# Patient Record
Sex: Female | Born: 1945
Health system: Southern US, Community
[De-identification: ages and names within clinical notes are randomized; demographics above are authoritative.]

## PROBLEM LIST (undated history)

## (undated) DIAGNOSIS — K449 Diaphragmatic hernia without obstruction or gangrene: Secondary | ICD-10-CM

## (undated) DIAGNOSIS — K219 Gastro-esophageal reflux disease without esophagitis: Secondary | ICD-10-CM

## (undated) DIAGNOSIS — Q453 Other congenital malformations of pancreas and pancreatic duct: Secondary | ICD-10-CM

## (undated) DIAGNOSIS — C4492 Squamous cell carcinoma of skin, unspecified: Secondary | ICD-10-CM

## (undated) DIAGNOSIS — K6389 Other specified diseases of intestine: Secondary | ICD-10-CM

## (undated) DIAGNOSIS — M1990 Unspecified osteoarthritis, unspecified site: Secondary | ICD-10-CM

## (undated) DIAGNOSIS — K648 Other hemorrhoids: Secondary | ICD-10-CM

## (undated) DIAGNOSIS — K589 Irritable bowel syndrome without diarrhea: Secondary | ICD-10-CM

## (undated) DIAGNOSIS — R0789 Other chest pain: Secondary | ICD-10-CM

## (undated) DIAGNOSIS — I480 Paroxysmal atrial fibrillation: Secondary | ICD-10-CM

## (undated) DIAGNOSIS — I340 Nonrheumatic mitral (valve) insufficiency: Secondary | ICD-10-CM

## (undated) DIAGNOSIS — K52831 Collagenous colitis: Secondary | ICD-10-CM

## (undated) DIAGNOSIS — Z5189 Encounter for other specified aftercare: Secondary | ICD-10-CM

## (undated) DIAGNOSIS — K5790 Diverticulosis of intestine, part unspecified, without perforation or abscess without bleeding: Secondary | ICD-10-CM

## (undated) DIAGNOSIS — H11003 Unspecified pterygium of eye, bilateral: Secondary | ICD-10-CM

## (undated) DIAGNOSIS — A0472 Enterocolitis due to Clostridium difficile, not specified as recurrent: Secondary | ICD-10-CM

## (undated) DIAGNOSIS — B3781 Candidal esophagitis: Secondary | ICD-10-CM

## (undated) DIAGNOSIS — E785 Hyperlipidemia, unspecified: Secondary | ICD-10-CM

## (undated) DIAGNOSIS — K222 Esophageal obstruction: Secondary | ICD-10-CM

## (undated) DIAGNOSIS — R931 Abnormal findings on diagnostic imaging of heart and coronary circulation: Secondary | ICD-10-CM

## (undated) DIAGNOSIS — S2249XA Multiple fractures of ribs, unspecified side, initial encounter for closed fracture: Secondary | ICD-10-CM

## (undated) DIAGNOSIS — K5190 Ulcerative colitis, unspecified, without complications: Secondary | ICD-10-CM

## (undated) DIAGNOSIS — S2239XA Fracture of one rib, unspecified side, initial encounter for closed fracture: Secondary | ICD-10-CM

## (undated) DIAGNOSIS — R011 Cardiac murmur, unspecified: Secondary | ICD-10-CM

## (undated) DIAGNOSIS — N200 Calculus of kidney: Secondary | ICD-10-CM

## (undated) HISTORY — DX: Other specified diseases of intestine: K63.89

## (undated) HISTORY — DX: Other hemorrhoids: K64.8

## (undated) HISTORY — PX: SHOULDER ARTHROSCOPY W/ ROTATOR CUFF REPAIR: SHX2400

## (undated) HISTORY — PX: CYSTOSCOPY W/ STONE MANIPULATION: SHX1427

## (undated) HISTORY — PX: BREAST CYST ASPIRATION: SHX578

## (undated) HISTORY — PX: BREAST BIOPSY: SHX20

## (undated) HISTORY — DX: Esophageal obstruction: K22.2

## (undated) HISTORY — DX: Fracture of one rib, unspecified side, initial encounter for closed fracture: S22.39XA

## (undated) HISTORY — DX: Hyperlipidemia, unspecified: E78.5

## (undated) HISTORY — DX: Diverticulosis of intestine, part unspecified, without perforation or abscess without bleeding: K57.90

## (undated) HISTORY — DX: Unspecified pterygium of eye, bilateral: H11.003

## (undated) HISTORY — DX: Cardiac murmur, unspecified: R01.1

## (undated) HISTORY — DX: Paroxysmal atrial fibrillation: I48.0

## (undated) HISTORY — DX: Other congenital malformations of pancreas and pancreatic duct: Q45.3

## (undated) HISTORY — DX: Multiple fractures of ribs, unspecified side, initial encounter for closed fracture: S22.49XA

## (undated) HISTORY — DX: Encounter for other specified aftercare: Z51.89

## (undated) HISTORY — DX: Diaphragmatic hernia without obstruction or gangrene: K44.9

## (undated) HISTORY — PX: OVARIAN CYST REMOVAL: SHX89

## (undated) HISTORY — DX: Abnormal findings on diagnostic imaging of heart and coronary circulation: R93.1

## (undated) HISTORY — PX: COLONOSCOPY: SHX174

## (undated) HISTORY — PX: EYE SURGERY: SHX253

## (undated) HISTORY — DX: Gastro-esophageal reflux disease without esophagitis: K21.9

## (undated) HISTORY — DX: Ulcerative colitis, unspecified, without complications: K51.90

## (undated) HISTORY — DX: Irritable bowel syndrome, unspecified: K58.9

## (undated) HISTORY — PX: LEG SURGERY: SHX1003

## (undated) HISTORY — DX: Nonrheumatic mitral (valve) insufficiency: I34.0

## (undated) HISTORY — DX: Candidal esophagitis: B37.81

## (undated) HISTORY — DX: Squamous cell carcinoma of skin, unspecified: C44.92

## (undated) HISTORY — PX: SQUAMOUS CELL CARCINOMA EXCISION: SHX2433

## (undated) HISTORY — DX: Collagenous colitis: K52.831

## (undated) HISTORY — PX: UPPER GASTROINTESTINAL ENDOSCOPY: SHX188

## (undated) HISTORY — DX: Enterocolitis due to Clostridium difficile, not specified as recurrent: A04.72

## (undated) HISTORY — DX: Unspecified osteoarthritis, unspecified site: M19.90

---
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
## 1970-07-23 HISTORY — PX: VAGINAL HYSTERECTOMY: SUR661

## 1973-07-23 HISTORY — PX: TONSILLECTOMY AND ADENOIDECTOMY: SUR1326

## 1998-03-23 ENCOUNTER — Other Ambulatory Visit: Admission: RE | Admit: 1998-03-23 | Discharge: 1998-03-23 | Payer: Self-pay | Admitting: Gastroenterology

## 1998-06-02 ENCOUNTER — Encounter: Payer: Self-pay | Admitting: Gastroenterology

## 1998-06-02 ENCOUNTER — Ambulatory Visit (HOSPITAL_COMMUNITY): Admission: RE | Admit: 1998-06-02 | Discharge: 1998-06-02 | Payer: Self-pay | Admitting: Gastroenterology

## 1999-02-15 ENCOUNTER — Ambulatory Visit (HOSPITAL_COMMUNITY): Admission: RE | Admit: 1999-02-15 | Discharge: 1999-02-15 | Payer: Self-pay | Admitting: Cardiovascular Disease

## 1999-06-01 ENCOUNTER — Other Ambulatory Visit: Admission: RE | Admit: 1999-06-01 | Discharge: 1999-06-01 | Payer: Self-pay | Admitting: Gynecology

## 1999-07-05 ENCOUNTER — Encounter: Admission: RE | Admit: 1999-07-05 | Discharge: 1999-07-05 | Payer: Self-pay | Admitting: Obstetrics and Gynecology

## 1999-07-05 ENCOUNTER — Encounter: Payer: Self-pay | Admitting: Obstetrics and Gynecology

## 2000-06-21 ENCOUNTER — Other Ambulatory Visit: Admission: RE | Admit: 2000-06-21 | Discharge: 2000-06-21 | Payer: Self-pay | Admitting: Gynecology

## 2000-06-25 ENCOUNTER — Encounter: Payer: Self-pay | Admitting: Gynecology

## 2000-06-25 ENCOUNTER — Encounter: Admission: RE | Admit: 2000-06-25 | Discharge: 2000-06-25 | Payer: Self-pay | Admitting: Gynecology

## 2001-01-31 ENCOUNTER — Encounter: Admission: RE | Admit: 2001-01-31 | Discharge: 2001-01-31 | Payer: Self-pay | Admitting: Gynecology

## 2001-01-31 ENCOUNTER — Encounter: Admission: RE | Admit: 2001-01-31 | Discharge: 2001-01-31 | Payer: Self-pay | Admitting: Gastroenterology

## 2001-01-31 ENCOUNTER — Encounter: Payer: Self-pay | Admitting: Gastroenterology

## 2001-01-31 ENCOUNTER — Encounter: Payer: Self-pay | Admitting: Gynecology

## 2001-07-30 ENCOUNTER — Encounter: Admission: RE | Admit: 2001-07-30 | Discharge: 2001-07-30 | Payer: Self-pay | Admitting: Gynecology

## 2001-07-30 ENCOUNTER — Encounter: Payer: Self-pay | Admitting: Gynecology

## 2001-09-29 ENCOUNTER — Other Ambulatory Visit: Admission: RE | Admit: 2001-09-29 | Discharge: 2001-09-29 | Payer: Self-pay | Admitting: Gynecology

## 2002-07-23 DIAGNOSIS — K449 Diaphragmatic hernia without obstruction or gangrene: Secondary | ICD-10-CM

## 2002-07-23 HISTORY — DX: Diaphragmatic hernia without obstruction or gangrene: K44.9

## 2002-07-31 ENCOUNTER — Encounter: Admission: RE | Admit: 2002-07-31 | Discharge: 2002-07-31 | Payer: Self-pay | Admitting: Gynecology

## 2002-07-31 ENCOUNTER — Encounter: Payer: Self-pay | Admitting: Gynecology

## 2003-01-18 ENCOUNTER — Other Ambulatory Visit: Admission: RE | Admit: 2003-01-18 | Discharge: 2003-01-18 | Payer: Self-pay | Admitting: Obstetrics and Gynecology

## 2003-01-20 ENCOUNTER — Encounter: Payer: Self-pay | Admitting: Gynecology

## 2003-01-20 ENCOUNTER — Encounter: Admission: RE | Admit: 2003-01-20 | Discharge: 2003-01-20 | Payer: Self-pay | Admitting: Gynecology

## 2003-06-30 ENCOUNTER — Encounter (INDEPENDENT_AMBULATORY_CARE_PROVIDER_SITE_OTHER): Payer: Self-pay | Admitting: Gastroenterology

## 2003-10-05 ENCOUNTER — Other Ambulatory Visit: Payer: Self-pay

## 2004-02-22 ENCOUNTER — Encounter: Admission: RE | Admit: 2004-02-22 | Discharge: 2004-02-22 | Payer: Self-pay | Admitting: Gynecology

## 2004-03-14 ENCOUNTER — Other Ambulatory Visit: Admission: RE | Admit: 2004-03-14 | Discharge: 2004-03-14 | Payer: Self-pay | Admitting: Gynecology

## 2004-03-21 ENCOUNTER — Encounter (INDEPENDENT_AMBULATORY_CARE_PROVIDER_SITE_OTHER): Payer: Self-pay | Admitting: Gastroenterology

## 2004-06-19 ENCOUNTER — Ambulatory Visit: Payer: Self-pay | Admitting: Cardiology

## 2004-06-23 ENCOUNTER — Ambulatory Visit: Payer: Self-pay | Admitting: Gastroenterology

## 2004-06-26 ENCOUNTER — Ambulatory Visit: Payer: Self-pay

## 2005-03-13 ENCOUNTER — Encounter: Admission: RE | Admit: 2005-03-13 | Discharge: 2005-03-13 | Payer: Self-pay | Admitting: Gynecology

## 2005-06-20 ENCOUNTER — Ambulatory Visit: Payer: Self-pay | Admitting: Cardiology

## 2005-07-17 ENCOUNTER — Ambulatory Visit: Payer: Self-pay | Admitting: Family Medicine

## 2005-10-24 ENCOUNTER — Ambulatory Visit: Payer: Self-pay | Admitting: Gastroenterology

## 2006-03-14 ENCOUNTER — Encounter: Admission: RE | Admit: 2006-03-14 | Discharge: 2006-03-14 | Payer: Self-pay | Admitting: Gynecology

## 2006-04-03 ENCOUNTER — Other Ambulatory Visit: Admission: RE | Admit: 2006-04-03 | Discharge: 2006-04-03 | Payer: Self-pay | Admitting: Gynecology

## 2006-05-01 ENCOUNTER — Ambulatory Visit: Payer: Self-pay | Admitting: General Practice

## 2006-05-23 ENCOUNTER — Ambulatory Visit: Payer: Self-pay | Admitting: Cardiology

## 2006-08-09 ENCOUNTER — Ambulatory Visit: Payer: Self-pay | Admitting: Gastroenterology

## 2006-10-04 ENCOUNTER — Ambulatory Visit: Payer: Self-pay | Admitting: Orthopaedic Surgery

## 2007-03-18 ENCOUNTER — Encounter: Admission: RE | Admit: 2007-03-18 | Discharge: 2007-03-18 | Payer: Self-pay | Admitting: Gynecology

## 2007-04-14 ENCOUNTER — Other Ambulatory Visit: Admission: RE | Admit: 2007-04-14 | Discharge: 2007-04-14 | Payer: Self-pay | Admitting: Gynecology

## 2007-05-28 ENCOUNTER — Ambulatory Visit: Payer: Self-pay | Admitting: Cardiology

## 2007-06-03 ENCOUNTER — Ambulatory Visit: Payer: Self-pay

## 2007-06-03 ENCOUNTER — Encounter: Payer: Self-pay | Admitting: Cardiology

## 2007-10-07 ENCOUNTER — Ambulatory Visit: Payer: Self-pay | Admitting: Internal Medicine

## 2007-10-08 DIAGNOSIS — K52831 Collagenous colitis: Secondary | ICD-10-CM | POA: Insufficient documentation

## 2008-03-18 ENCOUNTER — Encounter: Admission: RE | Admit: 2008-03-18 | Discharge: 2008-03-18 | Payer: Self-pay | Admitting: Gynecology

## 2008-04-14 ENCOUNTER — Other Ambulatory Visit: Admission: RE | Admit: 2008-04-14 | Discharge: 2008-04-14 | Payer: Self-pay | Admitting: Gynecology

## 2008-04-14 ENCOUNTER — Ambulatory Visit: Payer: Self-pay | Admitting: Gynecology

## 2008-04-14 ENCOUNTER — Encounter: Payer: Self-pay | Admitting: Gynecology

## 2008-04-20 ENCOUNTER — Ambulatory Visit: Payer: Self-pay | Admitting: Cardiology

## 2008-05-25 ENCOUNTER — Ambulatory Visit: Payer: Self-pay | Admitting: Gynecology

## 2008-06-08 ENCOUNTER — Ambulatory Visit: Payer: Self-pay

## 2008-07-23 DIAGNOSIS — K222 Esophageal obstruction: Secondary | ICD-10-CM

## 2008-07-23 DIAGNOSIS — K648 Other hemorrhoids: Secondary | ICD-10-CM

## 2008-07-23 HISTORY — DX: Other hemorrhoids: K64.8

## 2008-07-23 HISTORY — DX: Esophageal obstruction: K22.2

## 2008-12-29 DIAGNOSIS — M810 Age-related osteoporosis without current pathological fracture: Secondary | ICD-10-CM | POA: Insufficient documentation

## 2008-12-29 DIAGNOSIS — R000 Tachycardia, unspecified: Secondary | ICD-10-CM | POA: Insufficient documentation

## 2008-12-29 DIAGNOSIS — K580 Irritable bowel syndrome with diarrhea: Secondary | ICD-10-CM | POA: Insufficient documentation

## 2008-12-29 DIAGNOSIS — K582 Mixed irritable bowel syndrome: Secondary | ICD-10-CM | POA: Insufficient documentation

## 2008-12-29 DIAGNOSIS — K219 Gastro-esophageal reflux disease without esophagitis: Secondary | ICD-10-CM | POA: Insufficient documentation

## 2008-12-29 DIAGNOSIS — K222 Esophageal obstruction: Secondary | ICD-10-CM

## 2008-12-29 DIAGNOSIS — K589 Irritable bowel syndrome without diarrhea: Secondary | ICD-10-CM

## 2008-12-29 DIAGNOSIS — R002 Palpitations: Secondary | ICD-10-CM | POA: Insufficient documentation

## 2009-02-07 ENCOUNTER — Ambulatory Visit: Payer: Self-pay | Admitting: Unknown Physician Specialty

## 2009-03-22 ENCOUNTER — Encounter: Admission: RE | Admit: 2009-03-22 | Discharge: 2009-03-22 | Payer: Self-pay | Admitting: Gynecology

## 2009-04-21 ENCOUNTER — Ambulatory Visit: Payer: Self-pay | Admitting: Cardiology

## 2009-05-05 ENCOUNTER — Other Ambulatory Visit: Admission: RE | Admit: 2009-05-05 | Discharge: 2009-05-05 | Payer: Self-pay | Admitting: Gynecology

## 2009-05-05 ENCOUNTER — Encounter: Payer: Self-pay | Admitting: Gynecology

## 2009-05-05 ENCOUNTER — Ambulatory Visit: Payer: Self-pay | Admitting: Gynecology

## 2009-05-30 ENCOUNTER — Ambulatory Visit (HOSPITAL_COMMUNITY): Admission: RE | Admit: 2009-05-30 | Discharge: 2009-05-30 | Payer: Self-pay | Admitting: Gynecology

## 2010-03-23 ENCOUNTER — Encounter: Admission: RE | Admit: 2010-03-23 | Discharge: 2010-03-23 | Payer: Self-pay | Admitting: Gynecology

## 2010-04-24 ENCOUNTER — Ambulatory Visit: Payer: Self-pay | Admitting: Cardiovascular Disease

## 2010-05-08 ENCOUNTER — Ambulatory Visit: Payer: Self-pay | Admitting: Gynecology

## 2010-05-08 ENCOUNTER — Other Ambulatory Visit
Admission: RE | Admit: 2010-05-08 | Discharge: 2010-05-08 | Payer: Self-pay | Source: Home / Self Care | Admitting: Gynecology

## 2010-06-20 ENCOUNTER — Ambulatory Visit: Payer: Self-pay | Admitting: Gynecology

## 2010-08-08 ENCOUNTER — Ambulatory Visit (HOSPITAL_COMMUNITY)
Admission: RE | Admit: 2010-08-08 | Discharge: 2010-08-08 | Payer: Self-pay | Source: Home / Self Care | Attending: Gynecology | Admitting: Gynecology

## 2010-08-24 NOTE — Assessment & Plan Note (Signed)
Summary: 1 year      Allergies Added:   Visit Type:  Initial Consult Referring Provider:  Maisie Fus Wall,M.D. Primary Provider:  Jabier Mutton  CC:  "Doing well"..  History of Present Illness: [name] is a 65 year old woman with a history of palpitations and tachycardia who presents for routine followup. Last episode of profound tachycardia was 2 years ago and she has been well controlled on metoprolol.  She reports that she has had significant stress over the past several months. Her husband has developed a brain lesion associated with numbness in his face. They have spent numerous hours and visits to the hospital with commuting to both crease her hand.  She denies any problems. She has had no tachycardia palpitations. She continues to smoke 2 cigarettes per day and has been doing so for the past 30 years. She sleeps well when she takes a Xanax. She is no longer taking her reflux medication and feels well.   Echocardiogram 06/03/2007      -  Overall left ventricular systolic function was normal. Left         ventricular ejection fraction was estimated to be 60 %. There         were no left ventricular regional wall motion abnormalities.      -  No echocardiographic evidence for mitral valve prolapse.  EKG shows normal sinus rhythm with rate of 53 beats per minute, no significant ST or T wave changes  Current Medications (verified): 1)  Metoprolol Succinate 50 Mg Tb24 (Metoprolol Succinate) .... Take 1 Tablet By Mouth As Directed  Allergies (verified): 1)  Asa  Past History:  Past Medical History: Last updated: 12/29/2008 MITRAL VALVE PROLAPSE (ICD-424.0) PALPITATIONS (ICD-785.1) TACHYCARDIA (ICD-785) COLLAGENOUS COLITIS (ICD-710.9) IRRITABLE BOWEL SYNDROME (ICD-564.1) OSTEOPOROSIS (ICD-733.00) GERD (ICD-530.81)    Past Surgical History: Last updated: 12/29/2008 Shoulder surgery  Appendectomy Hysterectomy Tonsillectomy  Ovarian cysts  Social History: Last  updated: 12/29/2008 Full Time Tobacco Use - Former.  Alcohol Use - no Drug Use - no  Risk Factors: Smoking Status: quit (12/29/2008)  Review of Systems  The patient denies fever, weight loss, weight gain, vision loss, decreased hearing, hoarseness, chest pain, syncope, dyspnea on exertion, peripheral edema, prolonged cough, abdominal pain, incontinence, muscle weakness, depression, and enlarged lymph nodes.         palpitations  Vital Signs:  Patient profile:   65 year old female Height:      68 inches Weight:      134 pounds BMI:     20.45 Pulse rate:   53 / minute BP sitting:   100 / 62  (left arm) Cuff size:   regular  Vitals Entered By: Bishop Dublin, CMA (April 24, 2010 3:29 PM)  Physical Exam  General:  Well developed, well nourished, in no acute distress. Head:  normocephalic and atraumatic Neck:  Neck supple, no JVD. No masses, thyromegaly or abnormal cervical nodes. Lungs:  Clear bilaterally to auscultation and percussion. Heart:  Non-displaced PMI, chest non-tender; regular rate and rhythm, S1, S2 without murmurs, rubs or gallops. Carotid upstroke normal, no bruit. Pedals normal pulses. No edema, no varicosities. Abdomen:  Bowel sounds positive; abdomen soft and non-tender without masses Msk:  Back normal, normal gait. Muscle strength and tone normal. Pulses:  pulses normal in all 4 extremities Extremities:  No clubbing or cyanosis. Neurologic:  Alert and oriented x 3. Skin:  Intact without lesions or rashes. Psych:  Normal affect.   Impression & Recommendations:  Problem #  1:  TACHYCARDIA (ICD-785) Remote history of palpitations. Symptoms concerning for atrial tachycardia or SVT though she has not had any problems recently. She does have bradycardia and occasional fatigue. I did suggest that she could try to decrease her metoprolol to 25 mg daily.  Problem # 2:  MITRAL VALVE PROLAPSE (ICD-424.0) most recent echocardiogram did not confirm mitral valve  prolapse  Her updated medication list for this problem includes:    Metoprolol Succinate 50 Mg Tb24 (Metoprolol succinate) .Marland Kitchen... Take 1 tablet by mouth as directed  Other Orders: EKG w/ Interpretation (93000)  Patient Instructions: 1)  Your physician recommends that you continue on your current medications as directed. Please refer to the Current Medication list given to you today. 2)  Your physician wants you to follow-up in:   1 year You will receive a reminder letter in the mail two months in advance. If you don't receive a letter, please call our office to schedule the follow-up appointment. Prescriptions: METOPROLOL SUCCINATE 50 MG TB24 (METOPROLOL SUCCINATE) Take 1 tablet by mouth as directed  #90 x 4   Entered by:   Benedict Needy, RN   Authorized by:   Dossie Arbour MD   Signed by:   Benedict Needy, RN on 04/24/2010   Method used:   Electronically to        [facility] [address] (retail)       [address]       [place], [place]  [address]       Ph: [phone]       Fax: [phone]   RxID:   [id number]

## 2010-12-05 NOTE — Assessment & Plan Note (Signed)
[facility] OFFICE NOTE   PARIS, HOHN                        MRN:          [id number]  DATE:04/20/2008                            DOB:          03-30-1946    [name] returns today for management of the following issues.  1. Tachy palpitations.  These have been fairly stable.  2. Minimal mitral valve prolapse with echo actually showing no      significant prolapse with mitral valve thickening, last echo on      June 03, 2007.   She is on Toprol extended release 25 mg a day, Reglan 10 mg a day,  alendronate 70 mg every week.   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  She  pays all of the pills!   PHYSICAL EXAMINATION:  VITAL SIGNS:  Her blood pressure today is 102/64,  pulse 56 and regular, weight is 137.  HEENT:  Normal.  NECK:  Carotid upstrokes were equal bilaterally without bruits.  No JVD.  Thyroid is not enlarged.  Trachea is midline.  LUNGS:  Clear to auscultation percussion.  HEART:  Regular rate and rhythm.  No click or murmur.  ABDOMEN:  Soft.  Good bowel sounds.  EXTREMITIES:  There were no cyanosis, clubbing, or edema.  Pulses are  intact.  NEUROLOGIC:  Intact.   Electrocardiogram shows sinus brady.   Ms. Livecchi is doing well.  We renewed her Toprol.  We will see her back  again in a year.     Thomas C. Daleen Squibb, MD, [facility]  Electronically Signed    TCW/MedQ  DD: 04/20/2008  DT: 04/21/2008  Job #: [id number]

## 2010-12-05 NOTE — Assessment & Plan Note (Signed)
[facility]                         GASTROENTEROLOGY OFFICE NOTE   [name], [name]                        MRN:          [id number]  DATE:10/07/2007                            DOB:          [birth year]    CHIEF COMPLAINT:  Polyp with collagenous colitis.   PROBLEMS:  1. Collagenous colitis diagnosed at colonoscopy by Dr. Victorino Dike      September of 2005.  2. Irritable bowel syndrome.  3. Hiatal hernia, previous dilation for dysphagia and reflux symptoms.  4. Osteoporosis.  5. Mitral valve prolapse.  6. Palpitation/arrhythmia.   PAST SURGERIES:  1. Shoulder surgery.  2. Appendectomy.  3. Hysterectomy.  4. Tonsillectomy.  5. Ovarian cysts.   MEDICATIONS:  1. Toprol XR 25 mg daily.  2. Reglan 10 mg each night.  3. Asacol 3 three times a day intermittently.  4. Fosamax weekly.  5. Pamine forte p.r.n.   DRUG ALLERGIES:  ASPIRIN.  SHE IS SENSITIVE TO CERTAIN ANTIBIOTICS.   INTERVAL HISTORY:  [name] is coming to established care with me.  She still has bloating and urgent defecation after certain foods but  cannot really consistently pinpoint a certain meal that does that.  She  will go through spells where she will have diarrhea and does have some  nocturnal symptoms.  She does not feel like her quality of life is as  good as she would like with this.  She relates that her sister was  diagnosed with Parkinson disease and tardive dyskinesia after being on 4  Reglan a day for many years.  As far as the patient's symptoms, she will  go through cycles where she will have these problems.  The Reglan, it  looks like it was started in 2002 for nocturnal epigastric pain.  She  still has that problem at times.  She does not really take a proton pump  inhibitor.  She will use the Asacol intermittently as noted.  If she  starts to have diarrhea, she will start on it after weeks of symptoms,  but it is not clear to me how long she takes that for  or whether it  really works.  Weight has been stable.  She notes that if she eats small  amounts of food, things are less of a problem but sometimes it is  difficult to do so.  She will occasionally use a Pepcid for indigestion-  like problems.   SOCIAL HISTORY:  No alcohol, no tobacco, former smoker.  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  Actually gets her primary care there now  through the city physician.   PHYSICAL EXAMINATION:  Reveals a well-developed, middle-aged white woman  in no acute distress.  Weight 142 pounds, pulse 60, blood pressure 92/48.  EYES:  Anicteric.  CHEST:  Clear.  HEART:  S1, S2.  No rubs, murmurs or gallops.  I do not hear a click  today.  ABDOMEN:  Soft, nontender without organomegaly or masses.  LOWER EXTREMITIES:  Free of edema.  SKIN:  Warm and dry.  No acute rash.  NEURO:  Cranial  nerves II-XII intact.  PSYCH:  Appropriate mood and affect.   ASSESSMENT:  1. Collagenous colitis with probable underlying irritable bowel      syndrome as well.  2. Hiatal hernia, questionable reflux problems in the past, chronic      Reglan therapy for epigastric pain.   PLAN:  1. Discontinue Reglan.  2. Levsin SL can be used for that epigastric pain.  She has also been      on some Pamine, but she could try to Levsin intermittently.  In      particular, when she has borborygmi and cramps in her abdomen prior      to urgent defecation to see if that works.  3. Entocort EC 9 mg daily for 6 weeks.  In the past, she was given a      few samples of this but did not really take it long term, so I do      not think she has actually had an adequate trial of that.  The      Asacol itself is something that really should be taken every day      chronically to try to prevent problems.  I am not sure whether she      is having more collagenous colitis versus irritable bowel problems,      but I think this is a good first step to see what results occur      from a real  trial of this medication.  The risks and benefits were      explained.  She understands and agrees to proceed.  Now we will see      her back in two months.     Iva Boop, MD,FACG  Electronically Signed    CEG/MedQ  DD: 10/07/2007  DT: 10/07/2007  Job #: [id number]

## 2010-12-05 NOTE — Assessment & Plan Note (Signed)
[facility] OFFICE NOTE   ZONIE, CRUTCHER                        MRN:          [id number]  DATE:05/28/2007                            DOB:          Aug 21, 1945    Ms. Hannum returns today for management of the following issues:  1. Tachy palpitations.  These have been worse lately with a real bad      episode at the beach.  She says, if I had been drinking, I would      of understood it, but I don't drink.  It bothered her most of the      night after she lay down.  She had no other associated symptoms.      These do not occur with exertion.  2. Mitral valve prolapse.  This was diagnosed with a 2D echocardiogram      in 1995.  We have not repeated this study.   MEDICATIONS:  1. Toprol extended release 25 mg a day.  2. Reglan 10 mg daily.  3. Adendronate 70 mg every week.   She brings blood work from the city of [place], which shows a normal  potassium, normal CBC, normal thyroid profile.  Her total cholesterol  was 216, HDL 60, total cholesterol to HDL ratio 3.6, LDL 141,  triglycerides are normal.   PHYSICAL EXAMINATION:  GENERAL:  She is very vivacious and looks much  younger than stated age.  VITAL SIGNS:  Her blood pressure is 104/60, weight is 145.  HEENT:  Normocephalic atraumatic.  PERRLA.  Extraocular movements  intact.  Sclerae are clear.  Facial symmetry is normal.  NECK:  Carotid upstrokes are equal bilaterally without bruits.  No JVD.  Thyroid is not enlarged.  Trachea is midline.  Thyroid is not palpable.  LUNGS:  Clear.  HEART:  Reveals a nondisplaced PMI.  She has a very faint murmur at the  apex.  No obvious click.  There is no gallop.  ABDOMEN:  Soft.  Good bowel sounds.  EXTREMITIES:  Reveal no edema.  Pulses are intact.  NEUROLOGIC:  Intact.   ASSESSMENT/PLAN:  Ms. Skora has mitral valve prolapse and has had some  more frequent tachycardic palpitations.  As I told her, these can  come  in cycles without any way to predict the beginning or the end of them.  This is particularly true in patients with mitral valve prolapse. I have  requested  a 2D echocardiogram to be done here in the [place] office.  If this is stable,  we will see her back in a year.  At this time reassurance is in order.  If she continues to have these, we could increase her beta-blocker to 50  mg a day.     Thomas C. Daleen Squibb, MD, [facility]  Electronically Signed    TCW/MedQ  DD: 05/28/2007  DT: 05/28/2007  Job #: [id number]

## 2010-12-08 NOTE — Assessment & Plan Note (Signed)
[facility] OFFICE NOTE   Heather, Clark                        MRN:          [id number]  DATE:05/23/2006                            DOB:          04-13-1946    [name] returns today for further management of mitral valve prolapse and  palpitations.   She has been doing remarkably well.  Her biggest complaint is that of easy  bruising on her skin, which looks like capillary leakage.  She had recent  blood work at [facility], which basically showed her platelets to be normal,  the rest of her CBC to be normal.  Her comprehensive metabolic panel,  thyroid panel, were all stable.  Her lipids were pretty much unchanged, with  a fairly good total cholesterol and HDL ratio of less than 4.   She has been having very few palpitations.  She needs renewal on her  metoprolol.   MEDICATIONS:  1. Metoprolol extended release 25 mg a day.  2. Reglan 10 mg a day.  3. Asacol 3 t.i.d. p.r.n.   PHYSICAL EXAMINATION:  Blood pressure today is 104/66, her pulse is 58 and  regular.  Weight is 145.  She is in no acute distress.  Her carotid upstrokes are equal bilaterally without bruits, there is no JVD.  The thyroid is not enlarged.  Trachea is midline.  LUNGS:  Clear.  HEART:  Reveals a regular rate and rhythm.  There is a click with a very  soft murmur at the apex.  LUNGS:  Clear to auscultation and percussion.  ABDOMEN:  Exam is soft with good bowel sounds, there is a pulsatile aorta,  but it is not widened and there is no bruit.  There is no hepatomegaly.  EXTREMITIES:  Reveals no cyanosis, clubbing or edema.  Pulses are brisk.  NEUROLOGIC:  Exam is intact.   An electrocardiogram demonstrates sinus brady with no ST segment changes.   ASSESSMENT AND PLAN:  [name] is doing well.  I have renewed her  metoprolol extended release.  I have told her that she no longer needs SBE  prophylaxis secondary to new  guidelines.  I will plan on seeing her back in a year.  At that time she  will need a 2D echocardiogram.     Heather Fus C. Daleen Squibb, MD, [facility]  Electronically Signed    TCW/MedQ  DD: 05/23/2006  DT: 05/24/2006  Job #: [id number]   cc:   Heather Silence, MD

## 2010-12-08 NOTE — Assessment & Plan Note (Signed)
[facility]                         GASTROENTEROLOGY OFFICE NOTE   DAVENE, JOBIN                        MRN:          [id number]  DATE:08/09/2006                            DOB:          02/22/1946    Norena comes in says she is still having diarrhea, __________ helping her.  She wants some more.  She gets gas and bloating.  She says she is still  having her episodes.   Her weight was 146, blood pressure 110/68, pulse 64 and regular.  NECK, HEART, EXTREMITIES:  All unremarkable.   IMPRESSION:  Irritable bowel syndrome, related to some microscopic or  collagenous colitis in a patient who has a known very tortuous colon.  1. History of mitral valve prolapse.  2. Hypertension.  3. Anxiety/depression.  4. Gastroesophageal reflux disease.   RECOMMENDATIONS:  Try her on 3 mg Entocort daily, 9 mg daily total.  Xifaxan 200 mg 1 b.i.d. for 10 days then Flora-Q 1  daily.  I also gave  her some __________ refills.  I told her to come back in 3 to 4 weeks in  followup.  Hopefully this new regimen will get her under better control.     Ulyess Mort, MD  Electronically Signed    SML/MedQ  DD: 08/09/2006  DT: 08/09/2006  Job #: [id number]

## 2010-12-25 ENCOUNTER — Telehealth: Payer: Self-pay | Admitting: *Deleted

## 2010-12-25 NOTE — Telephone Encounter (Signed)
Pt called stating that she started new PCP with Beverely Risen and a carotid and Abd u/s were done showing aortic aneurysm measuring at 2 1/2. Pt was calling to find out when her last study was done. Notified pt that she had an echo last done in 2008 that was normal and no indications for a repeat test unless she develops symptoms. Pt states that Dr. Welton Flakes recommended pt have echo this Friday and schedule a stress test. Pt states she would like to have these tests done as she is worried about her new diagnosis. Notified pt that at this measurement, per Dr. Mariah Milling, we would just monitor its progress by annual or even every 2 year u/s. Pt understands this, and still wants to have these done at Dr. Milta Deiters office, she does want Dr. Windell Hummingbird opinion and will follow up sooner than scheduled (04/2011) if feels she needs to.

## 2011-01-11 ENCOUNTER — Encounter: Payer: Self-pay | Admitting: Cardiovascular Disease

## 2011-01-26 ENCOUNTER — Telehealth: Payer: Self-pay | Admitting: *Deleted

## 2011-01-26 NOTE — Telephone Encounter (Signed)
Pt called today stating that she recently had a treadmill by her pcp Beverely Risen, and was told there were abnormalities and they recommended she have a myoview at [facility]. I have scheduled pt to see Dr. Mariah Milling sooner than her scheduled f/u. She continues to have some CP, but mainly c/o incr in DOE. She recently walked up small hill and became severely SOB that it took her to recover.  Per last phone note 6/4- pt had recent echo at Dr. Welton Flakes (after establishing care there) that showed aortic aneurysm measuring 2 1/2, then soon after the treadmill was ordered there. Pt wants Dr. Mariah Milling to follow her since he knows her cardiac history. She has requested Dr. Welton Flakes send her stress test results here for his review. She states that she has had multiple tests over past few years at [facility] as well.   Dr. Mariah Milling, do you want to review her treadmill and have me schedule her for myoview prior to your seeing her, or wait until f/u? Her f/u with you is scheduled for 02/06/11. Please advise.

## 2011-01-29 NOTE — Telephone Encounter (Signed)
It would be nice to see treadmill prior to any further testing

## 2011-01-29 NOTE — Telephone Encounter (Signed)
Called Dr. Milta Deiters office, requested stress test with rhythm strips be sent. They will fax.

## 2011-02-01 ENCOUNTER — Encounter: Payer: Self-pay | Admitting: Cardiovascular Disease

## 2011-02-06 ENCOUNTER — Ambulatory Visit: Payer: Self-pay | Admitting: Cardiovascular Disease

## 2011-02-08 ENCOUNTER — Encounter: Payer: Self-pay | Admitting: *Deleted

## 2011-02-08 ENCOUNTER — Encounter: Payer: Self-pay | Admitting: Cardiovascular Disease

## 2011-02-08 ENCOUNTER — Ambulatory Visit (INDEPENDENT_AMBULATORY_CARE_PROVIDER_SITE_OTHER): Payer: Medicare Other | Admitting: Cardiovascular Disease

## 2011-02-08 DIAGNOSIS — K219 Gastro-esophageal reflux disease without esophagitis: Secondary | ICD-10-CM

## 2011-02-08 DIAGNOSIS — R0602 Shortness of breath: Secondary | ICD-10-CM

## 2011-02-08 DIAGNOSIS — R079 Chest pain, unspecified: Secondary | ICD-10-CM

## 2011-02-08 DIAGNOSIS — I059 Rheumatic mitral valve disease, unspecified: Secondary | ICD-10-CM

## 2011-02-08 DIAGNOSIS — K589 Irritable bowel syndrome without diarrhea: Secondary | ICD-10-CM

## 2011-02-08 NOTE — Assessment & Plan Note (Signed)
She continues to have periods of chest pain. Also with shortness of breath with exertion such as climbing hills. Outside report of a positive treadmill study. We'll set her up for a treadmill Myoview at her convenience.

## 2011-02-08 NOTE — Patient Instructions (Addendum)
You are doing well. No medication changes were made. We will schedule you for a stress test Please call us if you have new issues that need to be addressed before your next appt.  We will call you for a follow up Appt. In 12 months

## 2011-02-08 NOTE — Assessment & Plan Note (Signed)
She does report having periods of shortness of breath. Uncertain if this is cardiac or other etiology. Need to discuss with her effects from smoking if the stress test is negative.

## 2011-02-08 NOTE — Progress Notes (Signed)
Patient ID: [name], female    DOB: 07/18/1946, 65 y.o.   MRN: [id number]  HPI Comments: [name] is a 65 year old woman with a history of palpitations and tachycardia who presents for routine followup. Last episode of profound tachycardia was 2 years ago and she has been well controlled on metoprolol.   She reports having some discomfort in her back that occasionally radiates to her chest. This happens at rest most of the time. She also has some shortness of breath and reports one episode when she was climbing a hill when she had to stop to catch her breath for a long period of time. She had difficult time recovering. She continues to smoke 2 cigarettes per day and has been doing so for the past 30 years.  She reports that she does have a hiatal hernia by history.  She had a treadmill study done with Dr. Beverely Risen. Report suggests this was positive. She exercised for 15 minutes, achieved 10 METS. She had leg discomfort during the procedure, nonspecific ST and T-wave changes reported   Echocardiogram 06/03/2007      -  Overall left ventricular systolic function was normal. Left         ventricular ejection fraction was estimated to be 60 %. There         were no left ventricular regional wall motion abnormalities.      -  No echocardiographic evidence for mitral valve prolapse.  Recent echocardiogram done at an outside office was also essentially normal  Recent labs show total cholesterol 187, LDL 92, HDL 79   EKG shows normal sinus rhythm with rate of 59 beats per minute, no significant ST or T wave changes         Review of Systems  Constitutional: Negative.   HENT: Negative.   Eyes: Negative.   Respiratory: Positive for shortness of breath.   Cardiovascular: Positive for chest pain.  Gastrointestinal: Negative.   Musculoskeletal: Negative.   Skin: Negative.   Neurological: Negative.   Hematological: Negative.   Psychiatric/Behavioral: Negative.   All other systems  reviewed and are negative.    Outpatient Encounter Prescriptions as of 02/08/2011  Medication Sig Dispense Refill  . ALPRAZolam (XANAX) 0.5 MG tablet Take 0.5 mg by mouth at bedtime as needed.        . metoprolol succinate (TOPROL-XL) 25 MG 24 hr tablet Take 25 mg by mouth daily.          Physical Exam  Nursing note and vitals reviewed. Constitutional: She is oriented to person, place, and time. She appears well-developed and well-nourished.  HENT:  Head: Normocephalic.  Nose: Nose normal.  Mouth/Throat: Oropharynx is clear and moist.  Eyes: Conjunctivae are normal. Pupils are equal, round, and reactive to light.  Neck: Normal range of motion. Neck supple. No JVD present.  Cardiovascular: Normal rate, regular rhythm, S1 normal, S2 normal, normal heart sounds and intact distal pulses.  Exam reveals no gallop and no friction rub.   No murmur heard. Pulmonary/Chest: Effort normal and breath sounds normal. No respiratory distress. She has no wheezes. She has no rales. She exhibits no tenderness.  Abdominal: Soft. Bowel sounds are normal. She exhibits no distension. There is no tenderness.  Musculoskeletal: Normal range of motion. She exhibits no edema and no tenderness.  Lymphadenopathy:    She has no cervical adenopathy.  Neurological: She is alert and oriented to person, place, and time. Coordination normal.  Skin: Skin is warm and dry.  No rash noted. No erythema.  Psychiatric: She has a normal mood and affect. Her behavior is normal. Judgment and thought content normal.         Assessment and Plan

## 2011-02-15 ENCOUNTER — Ambulatory Visit: Payer: Self-pay | Admitting: Cardiovascular Disease

## 2011-02-15 DIAGNOSIS — R079 Chest pain, unspecified: Secondary | ICD-10-CM

## 2011-02-19 ENCOUNTER — Other Ambulatory Visit: Payer: Self-pay | Admitting: Gynecology

## 2011-02-19 DIAGNOSIS — Z1231 Encounter for screening mammogram for malignant neoplasm of breast: Secondary | ICD-10-CM

## 2011-03-28 ENCOUNTER — Ambulatory Visit
Admission: RE | Admit: 2011-03-28 | Discharge: 2011-03-28 | Disposition: A | Discharge status: Home / Self Care | Payer: Medicare Other | Source: Ambulatory Visit | Attending: Gynecology | Admitting: Gynecology

## 2011-03-28 DIAGNOSIS — Z1231 Encounter for screening mammogram for malignant neoplasm of breast: Secondary | ICD-10-CM

## 2011-04-18 ENCOUNTER — Encounter: Payer: Self-pay | Admitting: Cardiovascular Disease

## 2011-05-21 ENCOUNTER — Emergency Department: Payer: Self-pay | Admitting: *Deleted

## 2011-05-21 ENCOUNTER — Encounter: Payer: Self-pay | Admitting: Internal Medicine

## 2011-06-18 ENCOUNTER — Encounter: Payer: Self-pay | Admitting: Internal Medicine

## 2011-06-18 ENCOUNTER — Ambulatory Visit (INDEPENDENT_AMBULATORY_CARE_PROVIDER_SITE_OTHER): Payer: Medicare Other | Admitting: Internal Medicine

## 2011-06-18 DIAGNOSIS — K219 Gastro-esophageal reflux disease without esophagitis: Secondary | ICD-10-CM

## 2011-06-18 DIAGNOSIS — M359 Systemic involvement of connective tissue, unspecified: Secondary | ICD-10-CM

## 2011-06-18 DIAGNOSIS — K222 Esophageal obstruction: Secondary | ICD-10-CM

## 2011-06-18 DIAGNOSIS — R1314 Dysphagia, pharyngoesophageal phase: Secondary | ICD-10-CM

## 2011-06-18 NOTE — Progress Notes (Signed)
Subjective:    Patient ID: [name], female    DOB: Sep 02, 1945, 65 y.o.   MRN: [id number]  HPI This pleasant lady used to be followed by my retired partner. She has a history of GERD with stricture and esophageal dilation as well as IBS and collagenous colitis. She is here today complaining of increasing intermittent solid food dysphagia as well as reflux and regurgitation symptoms. The more recent flare started actually with chest pain in the middle the night, ER trip resulted in a negative evaluation for cardiac problem she says. She had diarrhea after that and then chest pain and pressure went away. She does have chronic intermittent diarrhea problems and to careful dietary manipulation, avoiding foods or triggers like salads etc. she generally doesn't K. She has not been treated for her collagenous colitis in some time.  She is taking Pepcid and says that PPI therapy have caused some diarrhea at times. She will occasionally using Nexium. She smokes 2 cigarettes a day. She does not drink caffeine.  Allergies  Allergen Reactions  . Aspirin    Outpatient Prescriptions Prior to Visit  Medication Sig Dispense Refill  . ALPRAZolam (XANAX) 0.5 MG tablet Take 0.5 mg by mouth at bedtime as needed.        . metoprolol succinate (TOPROL-XL) 25 MG 24 hr tablet Take 25 mg by mouth daily.         Past Medical History  Diagnosis Date  . Mitral valve disorders   . Palpitations   . Collagenous colitis   . Osteoporosis   . GERD (gastroesophageal reflux disease)   . IBS (irritable bowel syndrome)   . Internal hemorrhoids 2010    Colon-Dr. Mechele Collin   . Schatzki's ring 2010     EGD- Dr. Mechele Collin   . Hiatal hernia 2004  . Squamous cell carcinoma of skin     multiple   Past Surgical History  Procedure Date  . Shoulder surgery   . Appendectomy   . Vaginal hysterectomy   . Tonsillectomy   . Ovarian cysts   . Colonoscopy 03/21/2004    normal  . Upper gastrointestinal endoscopy 06/30/2003   w/dilation, hiatal hernia  . Colonoscopy 02/07/2009    internal hemorrhoids  . Upper gastrointestinal endoscopy 02/07/2009    mild schatzki ring/dilated    History   Social History  . Marital Status: Married    Spouse Name: N/A    Number of Children: N/A  . Years of Education: N/A   Social History Main Topics  . Smoking status: Current Some Day Smoker -- 1.0 packs/day for 35 years    Types: Cigarettes  . Smokeless tobacco: Never Used   Comment: Counseling sheet given in exam room. Two cigaretts a day or none at all per patient   . Alcohol Use: No  . Drug Use: No  . Sexually Active: None   Other Topics Concern  . None   Social History Narrative   Full time   Family History  Problem Relation Age of Onset  . Colon cancer Neg Hx   . Heart disease Father   . Diabetes Father   . Kidney disease Father   . Irritable bowel syndrome Father          Review of Systems Positive for recent laceration to the left lower extremity with Neomia Dear boot application, she's been having squamous cell carcinomas removed from there with Mohs surgery as well. Some pedal edema. She has chronic back and arthritis pains. All other  review of systems negative or as mentioned above.    Objective:   Physical Exam General: Thin Well-developed, well-nourished and in no acute distress Eyes:anicteric. Mouth and posterior pharynx: normal.  Neck: supple w/o thyromegaly or mass.  Lungs: clear. Heart: S1S2, no rubs, murmurs, gallops. Abdomen: soft, non-tender except mild bilateral lower quadrant sensitivity, no hepatosplenomegaly, hernia, or mass and BS+.  Lymphatics: no cervical or supraclavicular adenopathy. Extremities:  Trace bilateral lower ext edema Skin multiple scars from Moh's surgery lower exts. Neuro: A&O x 3.  Psych: appropriate mood and  affect.        Assessment & Plan:  This lady is having recurrent dysphagia. She also has reflux symptoms. She is not really on adequate PPI therapy.  I'm going to perform an EGD with planned esophageal dilation given the impact dysphagia history. She's had some intolerances to PPIs with diarrhea in the past. She says Nexium has been tolerated though she has not been using on a daily basis. I think she is going to need to use that are perhaps a generic PPI and see. That should hopefully prevent me for recurrent dilation. Risks benefits and indications of upper GI endoscopy with esophageal dilation are explained she understands and agrees to proceed.  She also has collagenous colitis and IBS. It sounds like her diarrhea problems or more of an IBS nature at this point. She seems to manage that pretty well on her on at this time.

## 2011-06-18 NOTE — Patient Instructions (Addendum)
You have been given a separate informational sheet regarding your tobacco use, the importance of quitting and local resources to help you quit. You have been scheduled for an Endoscopy with separate instructions given.  

## 2011-06-19 ENCOUNTER — Encounter: Payer: Self-pay | Admitting: Internal Medicine

## 2011-06-19 ENCOUNTER — Ambulatory Visit (AMBULATORY_SURGERY_CENTER): Payer: Medicare Other | Admitting: Internal Medicine

## 2011-06-19 VITALS — BP 123/70 | HR 57 | Temp 97.1°F | Resp 24 | Ht 68.0 in | Wt 136.0 lb

## 2011-06-19 DIAGNOSIS — K222 Esophageal obstruction: Secondary | ICD-10-CM

## 2011-06-19 DIAGNOSIS — K219 Gastro-esophageal reflux disease without esophagitis: Secondary | ICD-10-CM

## 2011-06-19 DIAGNOSIS — R1314 Dysphagia, pharyngoesophageal phase: Secondary | ICD-10-CM

## 2011-06-19 MED ORDER — FAMOTIDINE 40 MG PO TABS: 40.0000 mg | ORAL_TABLET | Freq: Two times a day (BID) | ORAL | Status: DC

## 2011-06-19 MED ORDER — SODIUM CHLORIDE 0.9 % IV SOLN: 500.0000 mL | INTRAVENOUS | Status: DC

## 2011-06-19 NOTE — Progress Notes (Signed)
Patient did not experience any of the following events: a burn prior to discharge; a fall within the facility; wrong site/side/patient/procedure/implant event; or a hospital transfer or hospital admission upon discharge from the facility. (G8907) Patient did not have preoperative order for IV antibiotic SSI prophylaxis. (G8918)  

## 2011-06-19 NOTE — Progress Notes (Signed)
Pt tolerated the EGD with dilatation very well. maw

## 2011-06-19 NOTE — Patient Instructions (Addendum)
The exam looked normal today. I stretched the esophagus anyway, since you have responded to that in the past. I have changed your famotidine or Pepcid dosing - please pick up your new prescription. If you are not doing well regarding heartburn and swallowing in the next 1-2 months, call me back to change to stronger medication like omeprazole or Nexium. Iva Boop, MD, [facility]   Please follow the ESOPHAGEAL DILATION DIET as follows:  -Nothing by mouth until 11:15am  -Clear Liquids for 1 hour 11:15am-12:15pm  -Soft foods 12:15pm-tomorrow  You may resume your medications as you would normally take them.       Esophageal Stricture The esophagus is the long, narrow tube which carries food and liquid from the mouth to the stomach. Sometimes a part of the esophagus becomes narrow and makes it difficult, painful, or even impossible to swallow. This is called an esophageal stricture.  CAUSES  Common causes of blockage or strictures of the esophagus are:  Exposure of the lower esophagus to the acid from the stomach may cause narrowing.   Hiatal hernia in which a small part of the stomach bulges up through the diaphragm can cause a narrowing in the bottom of the esophagus.   Scleroderma is a tissue disorder that affects the esophagus and makes swallowing difficult.   Achalasia is an absence of nerves in the lower esophagus and to the esophageal sphincter. This absence of nerves may be congenital (present since birth). This can cause irregular spasms which do not allow food and fluid through.   Strictures may develop from swallowing materials which damage the esophagus. Examples are acids or alkalis such as lye.   Schatzki's Ring is a narrow ring of non-cancerous tissue which narrows the lower esophagus. The cause of this is unknown.   Growths can block the esophagus.  SYMPTOMS  Some of the problems are difficulty swallowing or pain with swallowing. DIAGNOSIS  Your caregiver often  suspects this problem by taking a medical history. They will also do a physical exam. They may then take X-rays and/or perform an endoscopy. Endoscopy is an exam in which a tube like a small flexible telescope is used to look at your esophagus.  TREATMENT  One form of treatment is to dilate the narrow area. This means to stretch it.   When this is not successful, chest surgery may be required. This is a much more extensive form of treatment with a longer recovery time.  Both of the above treatments make the passage of food and water into the stomach easier. They also make it easier for stomach contents to bubble back into the esophagus. Special medications may be used following the procedure to help prevent further narrowing. Medications may be used to lower the amount of acid in the stomach juice.  SEEK IMMEDIATE MEDICAL CARE IF:   Your swallowing is becoming more painful, difficult, or you are unable to swallow.   You vomit up blood.   You develop black tarry stools.   You develop chills.   You have a fever.   You develop chest or abdominal pain.   You develop shortness of breath, feel lightheaded, or faint.  Follow up with medical care as your caregiver suggests. Document Released: 03/19/2006 Document Revised: 03/21/2011 Document Reviewed: 04/25/2006 [facility] Patient Information 2012 [place], [place].

## 2011-06-20 ENCOUNTER — Telehealth: Payer: Self-pay | Admitting: *Deleted

## 2011-06-20 NOTE — Telephone Encounter (Signed)
Follow up Call- Patient questions:  Do you have a fever, pain , or abdominal swelling? no Pain Score  0 *  Have you tolerated food without any problems? no  Have you been able to return to your normal activities? yes  Do you have any questions about your discharge instructions: Diet   no Medications  no Follow up visit  no  Do you have questions or concerns about your Care? no  Actions: * If pain score is 4 or above: No action needed, pain <4. Patient stating she is eating with out problems, clicked no on answers, should have answered yes.

## 2011-06-29 ENCOUNTER — Ambulatory Visit: Payer: Self-pay | Admitting: Internal Medicine

## 2011-07-01 ENCOUNTER — Other Ambulatory Visit: Payer: Self-pay | Admitting: Cardiovascular Disease

## 2011-07-03 ENCOUNTER — Telehealth: Payer: Self-pay

## 2011-07-03 ENCOUNTER — Other Ambulatory Visit: Payer: Self-pay

## 2011-07-03 MED ORDER — METOPROLOL SUCCINATE ER 25 MG PO TB24: 25.0000 mg | ORAL_TABLET | Freq: Every day | ORAL | Status: DC

## 2011-07-03 NOTE — Telephone Encounter (Signed)
Refill sent for metoprolol.  

## 2011-07-30 DIAGNOSIS — R109 Unspecified abdominal pain: Secondary | ICD-10-CM | POA: Diagnosis not present

## 2011-07-30 DIAGNOSIS — G2581 Restless legs syndrome: Secondary | ICD-10-CM | POA: Diagnosis not present

## 2011-07-30 DIAGNOSIS — K589 Irritable bowel syndrome without diarrhea: Secondary | ICD-10-CM | POA: Diagnosis not present

## 2011-07-30 DIAGNOSIS — I059 Rheumatic mitral valve disease, unspecified: Secondary | ICD-10-CM | POA: Diagnosis not present

## 2011-08-14 DIAGNOSIS — L820 Inflamed seborrheic keratosis: Secondary | ICD-10-CM | POA: Diagnosis not present

## 2011-08-14 DIAGNOSIS — L821 Other seborrheic keratosis: Secondary | ICD-10-CM | POA: Diagnosis not present

## 2011-08-14 DIAGNOSIS — L259 Unspecified contact dermatitis, unspecified cause: Secondary | ICD-10-CM | POA: Diagnosis not present

## 2011-08-31 ENCOUNTER — Telehealth: Payer: Self-pay | Admitting: *Deleted

## 2011-08-31 NOTE — Telephone Encounter (Signed)
Pt due for Reclast but is overdue for annual so therefore left message with patient to call theoffice to set up appt for one year follow up. Kw

## 2011-09-17 DIAGNOSIS — L570 Actinic keratosis: Secondary | ICD-10-CM | POA: Diagnosis not present

## 2011-09-17 DIAGNOSIS — R21 Rash and other nonspecific skin eruption: Secondary | ICD-10-CM | POA: Diagnosis not present

## 2011-09-19 DIAGNOSIS — R21 Rash and other nonspecific skin eruption: Secondary | ICD-10-CM | POA: Diagnosis not present

## 2011-09-26 DIAGNOSIS — L570 Actinic keratosis: Secondary | ICD-10-CM | POA: Diagnosis not present

## 2011-09-26 DIAGNOSIS — L821 Other seborrheic keratosis: Secondary | ICD-10-CM | POA: Diagnosis not present

## 2011-10-03 DIAGNOSIS — L570 Actinic keratosis: Secondary | ICD-10-CM | POA: Diagnosis not present

## 2011-10-16 ENCOUNTER — Telehealth: Payer: Self-pay | Admitting: *Deleted

## 2011-10-16 NOTE — Telephone Encounter (Signed)
See note below.  Patient did not schedule.

## 2011-10-16 NOTE — Telephone Encounter (Signed)
Message copied by Libby Maw on Tue Oct 16, 2011 12:29 PM ------      Message from: Richardson Chiquito      Created: Fri Aug 31, 2011 12:41 PM       Pt due for reclast but over due for AEX so i leftm essage for pt to call to schedule before doing reclast. kw

## 2011-10-19 DIAGNOSIS — L259 Unspecified contact dermatitis, unspecified cause: Secondary | ICD-10-CM | POA: Diagnosis not present

## 2011-10-25 DIAGNOSIS — L259 Unspecified contact dermatitis, unspecified cause: Secondary | ICD-10-CM | POA: Diagnosis not present

## 2011-10-29 DIAGNOSIS — T148 Other injury of unspecified body region: Secondary | ICD-10-CM | POA: Diagnosis not present

## 2011-11-01 ENCOUNTER — Other Ambulatory Visit: Payer: Self-pay

## 2011-11-01 DIAGNOSIS — C44721 Squamous cell carcinoma of skin of unspecified lower limb, including hip: Secondary | ICD-10-CM | POA: Diagnosis not present

## 2011-11-05 ENCOUNTER — Telehealth: Payer: Self-pay | Admitting: *Deleted

## 2011-11-05 NOTE — Telephone Encounter (Signed)
Lm for patient to call.  Due for Reclast.

## 2011-11-07 DIAGNOSIS — S81009A Unspecified open wound, unspecified knee, initial encounter: Secondary | ICD-10-CM | POA: Diagnosis not present

## 2011-11-07 DIAGNOSIS — S81809A Unspecified open wound, unspecified lower leg, initial encounter: Secondary | ICD-10-CM | POA: Diagnosis not present

## 2011-11-07 DIAGNOSIS — IMO0002 Reserved for concepts with insufficient information to code with codable children: Secondary | ICD-10-CM | POA: Diagnosis not present

## 2011-11-19 DIAGNOSIS — L570 Actinic keratosis: Secondary | ICD-10-CM | POA: Diagnosis not present

## 2011-11-19 DIAGNOSIS — T1490XA Injury, unspecified, initial encounter: Secondary | ICD-10-CM | POA: Diagnosis not present

## 2011-11-20 NOTE — Telephone Encounter (Signed)
Lm for patient to call

## 2011-11-28 DIAGNOSIS — L738 Other specified follicular disorders: Secondary | ICD-10-CM | POA: Diagnosis not present

## 2011-12-03 NOTE — Telephone Encounter (Signed)
No return call back. Per Sherrilyn Rist file and wait on patient to appear for appt.

## 2011-12-13 ENCOUNTER — Other Ambulatory Visit: Payer: Self-pay | Admitting: Dermatology

## 2011-12-13 DIAGNOSIS — D235 Other benign neoplasm of skin of trunk: Secondary | ICD-10-CM | POA: Diagnosis not present

## 2011-12-13 DIAGNOSIS — Z85828 Personal history of other malignant neoplasm of skin: Secondary | ICD-10-CM | POA: Diagnosis not present

## 2011-12-13 DIAGNOSIS — L819 Disorder of pigmentation, unspecified: Secondary | ICD-10-CM | POA: Diagnosis not present

## 2011-12-13 DIAGNOSIS — L570 Actinic keratosis: Secondary | ICD-10-CM | POA: Diagnosis not present

## 2011-12-13 DIAGNOSIS — C44721 Squamous cell carcinoma of skin of unspecified lower limb, including hip: Secondary | ICD-10-CM | POA: Diagnosis not present

## 2011-12-28 DIAGNOSIS — H2510 Age-related nuclear cataract, unspecified eye: Secondary | ICD-10-CM | POA: Diagnosis not present

## 2012-01-09 DIAGNOSIS — L738 Other specified follicular disorders: Secondary | ICD-10-CM | POA: Diagnosis not present

## 2012-01-14 DIAGNOSIS — K219 Gastro-esophageal reflux disease without esophagitis: Secondary | ICD-10-CM | POA: Diagnosis not present

## 2012-01-14 DIAGNOSIS — Z23 Encounter for immunization: Secondary | ICD-10-CM | POA: Diagnosis not present

## 2012-01-14 DIAGNOSIS — Z8249 Family history of ischemic heart disease and other diseases of the circulatory system: Secondary | ICD-10-CM | POA: Diagnosis not present

## 2012-01-14 DIAGNOSIS — J449 Chronic obstructive pulmonary disease, unspecified: Secondary | ICD-10-CM | POA: Diagnosis not present

## 2012-01-14 DIAGNOSIS — E860 Dehydration: Secondary | ICD-10-CM | POA: Diagnosis not present

## 2012-01-14 DIAGNOSIS — K449 Diaphragmatic hernia without obstruction or gangrene: Secondary | ICD-10-CM | POA: Diagnosis not present

## 2012-01-14 DIAGNOSIS — F17200 Nicotine dependence, unspecified, uncomplicated: Secondary | ICD-10-CM | POA: Diagnosis not present

## 2012-01-14 DIAGNOSIS — Z79899 Other long term (current) drug therapy: Secondary | ICD-10-CM | POA: Diagnosis not present

## 2012-01-14 DIAGNOSIS — Z85118 Personal history of other malignant neoplasm of bronchus and lung: Secondary | ICD-10-CM | POA: Diagnosis not present

## 2012-01-14 DIAGNOSIS — I4729 Other ventricular tachycardia: Secondary | ICD-10-CM | POA: Diagnosis not present

## 2012-01-14 DIAGNOSIS — I472 Ventricular tachycardia: Secondary | ICD-10-CM | POA: Diagnosis not present

## 2012-01-14 DIAGNOSIS — I059 Rheumatic mitral valve disease, unspecified: Secondary | ICD-10-CM | POA: Diagnosis present

## 2012-01-14 DIAGNOSIS — M810 Age-related osteoporosis without current pathological fracture: Secondary | ICD-10-CM | POA: Diagnosis not present

## 2012-01-14 DIAGNOSIS — Z85828 Personal history of other malignant neoplasm of skin: Secondary | ICD-10-CM | POA: Diagnosis not present

## 2012-01-14 DIAGNOSIS — K589 Irritable bowel syndrome without diarrhea: Secondary | ICD-10-CM | POA: Diagnosis not present

## 2012-01-14 DIAGNOSIS — R61 Generalized hyperhidrosis: Secondary | ICD-10-CM | POA: Diagnosis present

## 2012-01-14 DIAGNOSIS — M129 Arthropathy, unspecified: Secondary | ICD-10-CM | POA: Diagnosis not present

## 2012-01-14 DIAGNOSIS — F411 Generalized anxiety disorder: Secondary | ICD-10-CM | POA: Diagnosis present

## 2012-01-14 DIAGNOSIS — E785 Hyperlipidemia, unspecified: Secondary | ICD-10-CM | POA: Diagnosis not present

## 2012-01-14 DIAGNOSIS — I4891 Unspecified atrial fibrillation: Secondary | ICD-10-CM | POA: Diagnosis not present

## 2012-01-15 ENCOUNTER — Telehealth: Payer: Self-pay | Admitting: Cardiovascular Disease

## 2012-01-15 ENCOUNTER — Inpatient Hospital Stay (HOSPITAL_COMMUNITY)
Admission: AD | Admit: 2012-01-15 | Discharge: 2012-01-17 | DRG: 310 | Disposition: A | Discharge status: Home / Self Care | Payer: Medicare Other | Source: Other Acute Inpatient Hospital | Attending: Cardiovascular Disease | Admitting: Cardiovascular Disease

## 2012-01-15 ENCOUNTER — Other Ambulatory Visit: Payer: Self-pay

## 2012-01-15 ENCOUNTER — Encounter (HOSPITAL_COMMUNITY): Payer: Self-pay | Admitting: Nurse Practitioner

## 2012-01-15 ENCOUNTER — Telehealth: Payer: Self-pay

## 2012-01-15 DIAGNOSIS — R002 Palpitations: Secondary | ICD-10-CM

## 2012-01-15 DIAGNOSIS — Z79899 Other long term (current) drug therapy: Secondary | ICD-10-CM

## 2012-01-15 DIAGNOSIS — I4891 Unspecified atrial fibrillation: Secondary | ICD-10-CM

## 2012-01-15 DIAGNOSIS — K449 Diaphragmatic hernia without obstruction or gangrene: Secondary | ICD-10-CM | POA: Diagnosis present

## 2012-01-15 DIAGNOSIS — I059 Rheumatic mitral valve disease, unspecified: Secondary | ICD-10-CM | POA: Diagnosis present

## 2012-01-15 DIAGNOSIS — K219 Gastro-esophageal reflux disease without esophagitis: Secondary | ICD-10-CM

## 2012-01-15 DIAGNOSIS — K589 Irritable bowel syndrome without diarrhea: Secondary | ICD-10-CM | POA: Diagnosis present

## 2012-01-15 DIAGNOSIS — E785 Hyperlipidemia, unspecified: Secondary | ICD-10-CM | POA: Diagnosis present

## 2012-01-15 DIAGNOSIS — Z85828 Personal history of other malignant neoplasm of skin: Secondary | ICD-10-CM

## 2012-01-15 DIAGNOSIS — F17200 Nicotine dependence, unspecified, uncomplicated: Secondary | ICD-10-CM | POA: Diagnosis present

## 2012-01-15 DIAGNOSIS — F411 Generalized anxiety disorder: Secondary | ICD-10-CM | POA: Diagnosis present

## 2012-01-15 DIAGNOSIS — M810 Age-related osteoporosis without current pathological fracture: Secondary | ICD-10-CM | POA: Diagnosis present

## 2012-01-15 DIAGNOSIS — M129 Arthropathy, unspecified: Secondary | ICD-10-CM | POA: Diagnosis present

## 2012-01-15 DIAGNOSIS — K222 Esophageal obstruction: Secondary | ICD-10-CM

## 2012-01-15 DIAGNOSIS — R079 Chest pain, unspecified: Secondary | ICD-10-CM | POA: Diagnosis present

## 2012-01-15 HISTORY — DX: Other chest pain: R07.89

## 2012-01-15 HISTORY — DX: Calculus of kidney: N20.0

## 2012-01-15 MED ORDER — ALPRAZOLAM 0.5 MG PO TABS: 0.5000 mg | ORAL_TABLET | Freq: Every evening | ORAL | Status: DC | PRN

## 2012-01-15 MED ORDER — METOPROLOL TARTRATE 1 MG/ML IV SOLN: 5.0000 mg | INTRAVENOUS | Status: DC | PRN

## 2012-01-15 MED ORDER — NITROGLYCERIN 0.4 MG SL SUBL: 0.4000 mg | SUBLINGUAL_TABLET | SUBLINGUAL | Status: DC | PRN

## 2012-01-15 MED ORDER — ASPIRIN EC 81 MG PO TBEC
81.0000 mg | DELAYED_RELEASE_TABLET | Freq: Every day | ORAL | Status: DC
  Administered 2012-01-16: 81 mg via ORAL
  Filled 2012-01-15 (×2): qty 1

## 2012-01-15 MED ORDER — ACETAMINOPHEN 325 MG PO TABS: 650.0000 mg | ORAL_TABLET | ORAL | Status: DC | PRN

## 2012-01-15 MED ORDER — SODIUM CHLORIDE 0.9 % IJ SOLN: 3.0000 mL | INTRAMUSCULAR | Status: DC | PRN

## 2012-01-15 MED ORDER — ONDANSETRON HCL 4 MG/2ML IJ SOLN: 4.0000 mg | Freq: Four times a day (QID) | INTRAMUSCULAR | Status: DC | PRN

## 2012-01-15 MED ORDER — SODIUM CHLORIDE 0.9 % IJ SOLN
3.0000 mL | Freq: Two times a day (BID) | INTRAMUSCULAR | Status: DC
  Administered 2012-01-15: 3 mL via INTRAVENOUS

## 2012-01-15 MED ORDER — FAMOTIDINE 40 MG PO TABS
40.0000 mg | ORAL_TABLET | Freq: Two times a day (BID) | ORAL | Status: DC
  Administered 2012-01-15 – 2012-01-16 (×3): 40 mg via ORAL
  Filled 2012-01-15 (×5): qty 1

## 2012-01-15 MED ORDER — ZOLPIDEM TARTRATE 5 MG PO TABS
5.0000 mg | ORAL_TABLET | Freq: Every evening | ORAL | Status: DC | PRN
  Administered 2012-01-16: 5 mg via ORAL
  Filled 2012-01-15: qty 1

## 2012-01-15 MED ORDER — METOPROLOL TARTRATE 50 MG PO TABS
50.0000 mg | ORAL_TABLET | Freq: Two times a day (BID) | ORAL | Status: DC
  Administered 2012-01-15 – 2012-01-16 (×3): 50 mg via ORAL
  Filled 2012-01-15 (×5): qty 1

## 2012-01-15 MED ORDER — ENOXAPARIN SODIUM 60 MG/0.6ML ~~LOC~~ SOLN
60.0000 mg | Freq: Two times a day (BID) | SUBCUTANEOUS | Status: DC
  Administered 2012-01-15 – 2012-01-16 (×3): 60 mg via SUBCUTANEOUS
  Filled 2012-01-15 (×5): qty 0.6

## 2012-01-15 MED ORDER — SODIUM CHLORIDE 0.9 % IV SOLN: 250.0000 mL | INTRAVENOUS | Status: DC | PRN

## 2012-01-15 NOTE — Progress Notes (Signed)
Pt up moving HR 130.  Asymptomatic.  Pt denies pain or discomfort.  Also Had a 1.95second pause.  Flavia Shipper PA, aware,  Will cont. To monitor.  Amanda Pea, Charity fundraiser.

## 2012-01-15 NOTE — Telephone Encounter (Signed)
Error

## 2012-01-15 NOTE — Progress Notes (Signed)
Pt need order for diet.  Trish cardmaster notified pt has no order and instructed will notify MD.  Amanda Pea, RN.

## 2012-01-15 NOTE — Progress Notes (Signed)
Pt has no current order Trish, RN paged to have someone implement order.  Amanda Pea,

## 2012-01-15 NOTE — Telephone Encounter (Signed)
Dr. Kirke Corin rec'd t/c from Dr. Orvilla Cornwall at [facility].  Pt was admitted over the w/e for atrial fib with RVR. Family wants pt transferred to [facility]  Dr. Kirke Corin told MD we would make arrangements.  I called Trish at [phone] for transfer arrangements and had to [facility].

## 2012-01-15 NOTE — Telephone Encounter (Signed)
Trish called me back. She suggests I call [facility] at [phone].  I called [facility] and ghave them the info needed. They said they would take care of transport.

## 2012-01-15 NOTE — Progress Notes (Signed)
ANTICOAGULATION CONSULT NOTE - Initial Consult  Pharmacy Consult for Lovenox Indication: atrial fibrillation  Allergies  Allergen Reactions  . Aspirin Other (See Comments)    Aggravates Acid Reflux    Patient Measurements: Height: 5\' 8"  (172.7 cm) Weight: 135 lb 12.9 oz (61.6 kg) (scale a) IBW/kg (Calculated) : 63.9  Heparin Dosing Weight:   Vital Signs: Temp: 98.2 F (36.8 C) (06/25 1700) Temp src: Oral (06/25 1700) BP: 102/55 mmHg (06/25 1700) Pulse Rate: 82  (06/25 1700)  Labs: No results found for this basename: HGB:2,HCT:3,PLT:3,APTT:3,LABPROT:3,INR:3,HEPARINUNFRC:3,CREATININE:3,CKTOTAL:3,CKMB:3,TROPONINI:3 in the last 72 hours  CrCl is unknown because no creatinine reading has been taken.   Medical History: Past Medical History  Diagnosis Date  . Mitral valve disorders     a. 05/2007 Echo: EF 60%, NRWMA, No evidence of MVP, Triv MR.  . Palpitations   . Collagenous colitis   . Osteoporosis   . GERD (gastroesophageal reflux disease)   . IBS (irritable bowel syndrome)   . Internal hemorrhoids 2010    Colon-Dr. Mechele Collin   . Schatzki's ring 2010     EGD- Dr. Mechele Collin   . Hiatal hernia 2004  . Squamous cell carcinoma of skin     multiple  . Arthritis   . Cataract   . Hyperlipidemia   . Midsternal chest pain     a. 01/2011 Ex Mv: EF 68%, No ischemia.  . Atrial fibrillation     a. diagnosed 12/2011    Medications:  Scheduled:    . aspirin EC  81 mg Oral Daily  . enoxaparin (LOVENOX) injection  60 mg Subcutaneous Q12H  . famotidine  40 mg Oral BID  . metoprolol tartrate  50 mg Oral BID  . sodium chloride  3 mL Intravenous Q12H    Assessment: 66 yr old female transferred from [facility] for treatment of a.fib. She received a dose of Lovenox 40 mg this AM at 10:45. INR 1.03  Goal of Therapy:  Anti-Xa level 0.6-1.2 units/ml 4hrs after LMWH dose given Monitor platelets by anticoagulation protocol: Yes   Plan:  Will order Lovenox 60 mg SQ  q12hr. CBC every 3 days while on Lovenox.  Eugene Garnet 01/15/2012,7:54 PM

## 2012-01-15 NOTE — H&P (Signed)
Patient ID: LIVY ROSS MRN: [id number], DOB/AGE: 10-27-1945   Admit date: 01/15/2012   Primary Physician: Lyndon Code, MD Primary Cardiologist: Concha Se, MD  Pt. Profile:  66 y/o female with a long h/o palpitations who presents on transfer from [facility] 2/2 Afib.  Problem List  Past Medical History  Diagnosis Date  . Mitral valve disorders     a. 05/2007 Echo: EF 60%, NRWMA, No evidence of MVP, Triv MR.  . Palpitations   . Collagenous colitis   . Osteoporosis   . GERD (gastroesophageal reflux disease)   . IBS (irritable bowel syndrome)   . Internal hemorrhoids 2010    Colon-Dr. Mechele Collin   . Schatzki's ring 2010     EGD- Dr. Mechele Collin   . Hiatal hernia 2004  . Squamous cell carcinoma of skin     multiple  . Arthritis   . Cataract   . Hyperlipidemia   . Midsternal chest pain     a. 01/2011 Ex Mv: EF 68%, No ischemia.  . Atrial fibrillation     a. diagnosed 12/2011    Past Surgical History  Procedure Date  . Shoulder surgery   . Appendectomy   . Vaginal hysterectomy   . Tonsillectomy   . Ovarian cysts   . Colonoscopy 03/21/2004    normal  . Upper gastrointestinal endoscopy 06/30/2003    w/dilation, hiatal hernia  . Colonoscopy 02/07/2009    internal hemorrhoids  . Upper gastrointestinal endoscopy 02/07/2009, 06/19/11    2010 - mild schatzki ring/dilated  2012 - normal, dilated 54 Fr     Allergies  Allergies  Allergen Reactions  . Aspirin     unknown    HPI  66 y/o female with the above problem list.  She has a long h/o palpitations and is maintained on Toprol @ home.  She has worn monitors in the past (>20y ago), which were unrevealing.  She also has a h/o MVP though this was not appreciated on her last echo in 2008.    Over the past few weeks, she has noted intermittent palpitations, usually lasting a few mins and resolving spont.  Yesterday she was at her house on [address] [place] and was in her USOH until the early afternoon when she  stooped to pick up a laundry basket and as she stood she had sudden onset of chest pressure followed by tachypalpitations.  These were associated with dyspnea and headache.  She felt that her heart was beating out of her chest and had never had palpitations like that before.  She took an additional Metoprolol and presented to [facility] in [place] where she was found to be in afib with RVR @ 128.  She was treated with 5mg  of IV lopressor with reduction in rate to 105.  She was admitted and placed on IV Lopressor PRN and lovenox.  She remained in afib this AM and her family requested transfer to [facility] for further evaluation, since her care has been through our practice before.  CE and D Dimer are normal.  She is currently asymptomatic but remains in afib.  Rates here are reasonable in the 70's to 90's.  TSH is not on file. Home Medications  Prior to Admission medications   Medication Sig Start Date End Date Taking? Authorizing Provider  ALPRAZolam Prudy Feeler) 0.5 MG tablet Take 0.5 mg by mouth at bedtime as needed. For anxiety   Yes Historical Provider, MD  famotidine (PEPCID) 40 MG tablet Take 1  tablet (40 mg total) by mouth 2 (two) times daily. 06/19/11 06/18/12 Yes Iva Boop, MD  metoprolol (TOPROL-XL) 50 MG 24 hr tablet TAKE ONE TABLET BY MOUTH EVERY DAY AS DIRECTED 07/01/11  Yes Antonieta Iba, MD  Simethicone (GAS-X PO) Take 1 tablet by mouth as needed. For gas   Yes Historical Provider, MD   Family History  Family History  Problem Relation Age of Onset  . Colon cancer Neg Hx   . Heart disease Father     A Fib and CHF - died in Mid 58  . Diabetes Father   . Kidney disease Father   . Irritable bowel syndrome Father   . Alzheimer's disease Mother     Died in [place] 66's.   Social History  History   Social History  . Marital Status: Married    Spouse Name: N/A    Number of Children: N/A  . Years of Education: N/A   Occupational History  . Not on file.   Social History Main  Topics  . Smoking status: Current Some Day Smoker -- 1.0 packs/day for 35 years    Types: Cigarettes  . Smokeless tobacco: Never Used   Comment: Counseling sheet given in exam room. Two cigaretts a day or none at all per patient though dtr says she smokes more than that.  . Alcohol Use: Yes     1 beer a week  . Drug Use: No  . Sexually Active: Not on file   Other Topics Concern  . Not on file   Social History Narrative   Lives in [place] with husband.    Review of Systems General:  No chills, fever, night sweats or weight changes.  Cardiovascular:  +++ c/p, palps, dyspnea as outlined above.  No edema, orthopnea, paroxysmal nocturnal dyspnea. Dermatological: No rash, lesions/masses Respiratory: No cough, dyspnea Urologic: No hematuria, dysuria Abdominal:   No nausea, vomiting, diarrhea, bright red blood per rectum, melena, or hematemesis Neurologic:  No visual changes, wkns, changes in mental status. All other systems reviewed and are otherwise negative except as noted above.  Physical Exam  Blood pressure 102/55, pulse 82, temperature 98.2 F (36.8 C), temperature source Oral, resp. rate 20, height 5\' 8"  (1.727 m), weight 135 lb 12.9 oz (61.6 kg), SpO2 96.00%.  General: Pleasant, NAD Psych: Normal affect. Neuro: Alert and oriented X 3. Moves all extremities spontaneously. HEENT: Normal  Neck: Supple without bruits or JVD. Lungs:  Resp regular and unlabored, CTA. Heart: IR, IR no s3, s4, or murmurs. Abdomen: Soft, non-tender, non-distended, BS + x 4.  Extremities: No clubbing, cyanosis or edema. DP/PT/Radials 2+ and equal bilaterally.  Labs  Labs on chart from [facility] reviewed - CE negative.  D Dimer normal.   Radiology/Studies  No results found.  ECG  Afib, rvr, 105, no acute st/t changes.  ASSESSMENT AND PLAN  1.  Afib RVR:  Currently reasonably rate controlled.  CHADS2 = 0.  She received lovenox this AM and we will order here.  Ss started yesterday placing  [place] within a 48 hr window.  Will add lopressor 50 bid and if she doesn't convert by morning, plan to perform DCCV with short-term coumadin x 4 wks.  Check TSH and echo.  2.  Chest Pain:  CE negative.  Ss occurred in the setting of RVR.  Negative myoview last July.  Cont asa for now.  3.  GERD:  On pepcid @ home.   Signed, Nicolasa Ducking, NP 01/15/2012,  5:46 PM'  Patient was examined and history reviewed. Agree with findings as noted above.  By history she has had similar brief episodes in the past but always self-limited and never documented by EKG. She has been on long-term low dose metoprolol succinate 25 mg daily and may need higher dose going forward.  Physical exam is unremarkable except for irregular pulse. She is Chadss=0 and will prepare for DCCV in am if she has not converted by then.

## 2012-01-15 NOTE — Progress Notes (Signed)
Paged [name] r/t to pt's diet instructed he will be up to see pt and put orderst in computer.  However instructed that pt can have a Heart health.   Pt refused to eat at this time.  Pt's daughter made aware also..  States she has had a bite before coming over for admission and eats a little at a time and will wait until later.  Encouraged pt to call for assistance.  Verbalized understanding.   ,RN

## 2012-01-16 ENCOUNTER — Encounter (HOSPITAL_COMMUNITY): Payer: Self-pay | Admitting: Anesthesiology

## 2012-01-16 ENCOUNTER — Encounter (HOSPITAL_COMMUNITY)
Admission: AD | Disposition: A | Discharge status: Home / Self Care | Payer: Self-pay | Source: Other Acute Inpatient Hospital | Attending: Cardiovascular Disease

## 2012-01-16 ENCOUNTER — Inpatient Hospital Stay (HOSPITAL_COMMUNITY): Payer: Medicare Other | Admitting: Anesthesiology

## 2012-01-16 DIAGNOSIS — H269 Unspecified cataract: Secondary | ICD-10-CM | POA: Diagnosis not present

## 2012-01-16 DIAGNOSIS — I4891 Unspecified atrial fibrillation: Secondary | ICD-10-CM | POA: Diagnosis not present

## 2012-01-16 DIAGNOSIS — M810 Age-related osteoporosis without current pathological fracture: Secondary | ICD-10-CM | POA: Diagnosis not present

## 2012-01-16 DIAGNOSIS — K219 Gastro-esophageal reflux disease without esophagitis: Secondary | ICD-10-CM | POA: Diagnosis not present

## 2012-01-16 DIAGNOSIS — K589 Irritable bowel syndrome without diarrhea: Secondary | ICD-10-CM | POA: Diagnosis not present

## 2012-01-16 HISTORY — PX: CARDIOVERSION: SHX1299

## 2012-01-16 LAB — BASIC METABOLIC PANEL
CO2: 25 mEq/L (ref 19–32)
Chloride: 107 mEq/L (ref 96–112)
GFR calc Af Amer: 79 mL/min — ABNORMAL LOW (ref >90)
Sodium: 141 mEq/L (ref 135–145)

## 2012-01-16 LAB — CBC
HCT: 38 % (ref 36.0–46.0)
MCV: 90 fL (ref 78.0–100.0)
Platelets: 169 10*3/uL (ref 150–400)
RBC: 4.22 MIL/uL (ref 3.87–5.11)
WBC: 7.2 10*3/uL (ref 4.0–10.5)

## 2012-01-16 LAB — TSH: TSH: 2.759 u[IU]/mL (ref 0.350–4.500)

## 2012-01-16 SURGERY — CARDIOVERSION
Anesthesia: Monitor Anesthesia Care | Wound class: Clean

## 2012-01-16 MED ORDER — PROPOFOL 10 MG/ML IV EMUL
INTRAVENOUS | Status: DC | PRN
  Administered 2012-01-16: 80 mg via INTRAVENOUS

## 2012-01-16 MED ORDER — SODIUM CHLORIDE 0.9 % IJ SOLN
3.0000 mL | Freq: Two times a day (BID) | INTRAMUSCULAR | Status: DC
  Administered 2012-01-16: 3 mL via INTRAVENOUS

## 2012-01-16 MED ORDER — HYDROCORTISONE 1 % EX CREA
1.0000 "application " | TOPICAL_CREAM | Freq: Three times a day (TID) | CUTANEOUS | Status: DC | PRN
  Filled 2012-01-16: qty 28

## 2012-01-16 MED ORDER — SODIUM CHLORIDE 0.9 % IJ SOLN: 3.0000 mL | INTRAMUSCULAR | Status: DC | PRN

## 2012-01-16 MED ORDER — SODIUM CHLORIDE 0.9 % IV SOLN: 250.0000 mL | INTRAVENOUS | Status: DC

## 2012-01-16 NOTE — Progress Notes (Signed)
0500 Patient had a pause this am of 2.28 seconds. Patient was sleep at the time. VS were taken and on call MD was notified and made aware. No new orders written.

## 2012-01-16 NOTE — Progress Notes (Signed)
   Patient Name: [name] Date of Encounter: 01/16/2012  Principal Problem:  *Atrial fibrillation with rapid ventricular response Active Problems:  MITRAL VALVE PROLAPSE  Chest pain   SUBJECTIVE: Palpitations and lightheadness whenever she gets up to walk to the bathroom.   OBJECTIVE Filed Vitals:   01/15/12 2215 01/16/12 0300 01/16/12 0508 01/16/12 0651  BP: 101/64 97/56 105/62 111/65  Pulse: 68 95 76 95  Temp: 98.1 F (36.7 C) 97.8 F (36.6 C)    TempSrc: Oral Oral Oral   Resp: 18 18 18 18   Height:      Weight:   133 lb 13.1 oz (60.7 kg)   SpO2: 98% 98% 99% 100%    Intake/Output Summary (Last 24 hours) at 01/16/12 0809 Last data filed at 01/16/12 0314  Gross per 24 hour  Intake    360 ml  Output    650 ml  Net   -290 ml   Weight change:  Filed Weights   01/15/12 1700 01/16/12 0508  Weight: 135 lb 12.9 oz (61.6 kg) 133 lb 13.1 oz (60.7 kg)   PHYSICAL EXAM General: Well developed, well nourished, female in no acute distress. Head: Normocephalic, atraumatic.  Neck: Supple without bruits, JVD not elevated. Lungs:  Resp regular and unlabored, CTA. Heart: Irregular rate and rhythm, S1, S2, no S3, S4, or murmur. Abdomen: Soft, non-tender, non-distended, BS + x 4.  Extremities: L inner wrist bruise. No cyanosis, no edema.  Neuro: Alert and oriented X 3. Moves all extremities spontaneously. Psych: anxious  LABS: CBC: Basename 01/16/12 0519  WBC 7.2  NEUTROABS --  HGB 12.7  HCT 38.0  MCV 90.0  PLT 169   INR: Basic Metabolic Panel: Basename 01/16/12 0519  NA 141  K 4.2  CL 107  CO2 25  GLUCOSE 81  BUN 16  CREATININE 0.87  CALCIUM 8.6  MG --  PHOS --   TELE:   Atrial fibrillation     ECG: Atrial fibrillation. Nonspecific ST abnormality.  Radiology/Studies: No results found.  Current Medications:    . aspirin EC  81 mg Oral Daily  . enoxaparin (LOVENOX) injection  60 mg Subcutaneous Q12H  . famotidine  40 mg Oral BID  . metoprolol  tartrate  50 mg Oral BID  . sodium chloride  3 mL Intravenous Q12H    ASSESSMENT AND PLAN:  1. PAF- According to the patient, this  has been happening occasionally for the past 50yrs lasting only few seconds. However since 01/14/12 it has become worse with activity. Patient currently remains  in afib and being managed with lopressor, heart rate  in the 90s but goes up to the 150s and symptomatic with activity. She is  on lovenox , but since afib is not new, will do TEE DCCV. Will  check lipid panel,TSH level and do echo to rule out any valvular anomalies. Will also check CE to rule out ACS.  2. MVP- per last echo in 2008, there was no evidence of MVP.  Will assess with echo.   3. Anxiety- managed with xanax    SignedTheodore Demark , PA-C 8:09 AM 01/16/2012

## 2012-01-16 NOTE — Anesthesia Postprocedure Evaluation (Signed)
  Anesthesia Post-op Note  Patient: [name]  Procedure(s) Performed: Procedure(s) (LRB): CARDIOVERSION (N/A)  Patient Location: Nursing Unit  Anesthesia Type: General  Level of Consciousness: awake  Airway and Oxygen Therapy: Patient Spontanous Breathing  Post-op Pain: none  Post-op Assessment: Post-op Vital signs reviewed, Patient's Cardiovascular Status Stable, Respiratory Function Stable, Patent Airway and No signs of Nausea or vomiting  Post-op Vital Signs: Reviewed and stable  Complications: No apparent anesthesia complications

## 2012-01-16 NOTE — Progress Notes (Signed)
    I have seen and examined the patient. I agree with the above note with the addition of : The patient has previous history of tachycardia without documented arrhythmia. She usually feels fast heartbeats for only a few seconds. However, on Monday around 4 PM while she changed her position to pick up something on the floor, she felt a click in her heart followed by fast irregular heartbeats. She was found to be in atrial fibrillation. It appears from her history that the onset was on Monday around 4 PM. Her atrial fibrillation has been difficult to control with rate control. She has been on Lovenox since hospital admission. Her chads score is 0. I recommend cardioversion today to be done before 2:00 PM. Otherwise, she would need TEE before the cardioversion. Check 2-D echo. Continue Lovenox today and tomorrow morning to overlap with one dose of Xarelto which can be initiated this evening and continued for a minimum of 4 weeks after cardioversion.  Lorine Bears MD, [facility] 01/16/2012 10:01 AM

## 2012-01-16 NOTE — Progress Notes (Signed)
1610 was notified that patient's HR was sustaining around 130's to 140's. When I entered the patients room I seen that she was standing up at the sink. Helped patient back into the bed and had her to lay on her left side and bear down. VS were obtained and  rapid response was called and notified. On call physician for Baird Lyons was called and notified. While on the phone with the doctor patient's heart rated decreased back to the 76 to 84 bpm range. While on the phone the physician on call was notified of this. Was told that a physician will come in this morning to assess patient further. Rapid response entered the room soon after and assessed the patient and seen that the patient's heart rate had decreased and was now in the 80's.

## 2012-01-16 NOTE — Plan of Care (Signed)
Problem: Phase I Progression Outcomes Goal: Hemodynamically stable Outcome: Completed/Met Date Met:  01/16/12 Stable form cardioversion today.

## 2012-01-16 NOTE — Procedures (Signed)
Electrical Cardioversion Procedure Note HUGH GARROW [id number] Sep 25, 1945  Procedure: Electrical Cardioversion Indications:  Atrial fib  Procedure Details Consent: DCCV Time Out: Verified patient identification, verified procedure, site/side was marked, verified correct patient position, special equipment/implants available, medications/allergies/relevent history reviewed, required imaging and test results available.    Patient placed on cardiac monitor, pulse oximetry, supplemental oxygen as necessary.  Sedation given: propafol 50 mg IV Pacer pads placed anterio-posterior.  Cardioverted one time(s).  Cardioverted at 200 J synchronized biphasic energy  Evaluation Findings: Post procedure EKG shows: NSR Complications: None Patient did tolerate procedure well.   Buel Ream.D. 01/16/2012, 10:58 AM

## 2012-01-16 NOTE — Transfer of Care (Signed)
Immediate Anesthesia Transfer of Care Note  Patient: [name]  Procedure(s) Performed: Procedure(s) (LRB): CARDIOVERSION (N/A)  Patient Location: PACU and Nursing Unit  Anesthesia Type: MAC and General  Level of Consciousness: awake, alert  and oriented  Airway & Oxygen Therapy: Patient connected to nasal cannula oxygen  Post-op Assessment: Report given to PACU RN and Post -op Vital signs reviewed and stable  Post vital signs: Reviewed and stable  Complications: No apparent anesthesia complications

## 2012-01-16 NOTE — Progress Notes (Signed)
Pt's HR 150 on monitor,  Bp112/60.  Pt at bedside taking a bath at this time.  Asymptomatic.  Also received call from echo  And informed Melissa pt's HR 150 asymptomatic and she instructed will not be able to do echo at this time due to increase HR,  Will f/u later.  Pt assisted back to bed and encourage to call for help.  Verbalized understanding.  Amanda Pea, Charity fundraiser.

## 2012-01-16 NOTE — Anesthesia Preprocedure Evaluation (Addendum)
Anesthesia Evaluation  Patient identified by MRN, date of birth, ID band Patient awake    Reviewed: Allergy & Precautions, H&P , NPO status , Patient's Chart, lab work & pertinent test results  Airway Mallampati: II TM Distance: >3 FB Neck ROM: Full    Dental No notable dental hx. (+) Teeth Intact and Dental Advisory Given   Pulmonary Current Smoker,  breath sounds clear to auscultation  Pulmonary exam normal       Cardiovascular + dysrhythmias Atrial Fibrillation Rhythm:Irregular Rate:Normal     Neuro/Psych negative neurological ROS  negative psych ROS   GI/Hepatic Neg liver ROS, hiatal hernia, GERD-  ,  Endo/Other  negative endocrine ROS  Renal/GU negative Renal ROS  negative genitourinary   Musculoskeletal negative musculoskeletal ROS (+)   Abdominal   Peds  Hematology negative hematology ROS (+)   Anesthesia Other Findings   Reproductive/Obstetrics negative OB ROS                          Anesthesia Physical Anesthesia Plan  ASA: III  Anesthesia Plan: General   Post-op Pain Management:    Induction: Intravenous  Airway Management Planned: Mask  Additional Equipment:   Intra-op Plan:   Post-operative Plan:   Informed Consent: I have reviewed the patients History and Physical, chart, labs and discussed the procedure including the risks, benefits and alternatives for the proposed anesthesia with the patient or authorized representative who has indicated his/her understanding and acceptance.   Dental advisory given  Plan Discussed with: CRNA  Anesthesia Plan Comments:         Anesthesia Quick Evaluation

## 2012-01-16 NOTE — Progress Notes (Signed)
Utilization review completed.  

## 2012-01-16 NOTE — Plan of Care (Signed)
Problem: Phase I Progression Outcomes Goal: Pain controlled with appropriate interventions Outcome: Completed/Met Date Met:  01/16/12 Pt's HR 140-250 controlled by cardioversion today.

## 2012-01-16 NOTE — Progress Notes (Signed)
Called by bedside RN for HR 140s, upon assessment NSMT told RRT that patient had a similar event around 2000 when getting up to the bathroom. After talking with the bedside RN, the patient was getting up to use the bathroom prior to this event as well. Patient back in bed, in no distress, HR 80s. MD aware, advised bedside RN to call if needed, will continue to monitor

## 2012-01-16 NOTE — Interval H&P Note (Signed)
History and Physical Interval Note:  01/16/2012 10:38 AM  [name]  has presented today for surgery, with the diagnosis of A-fib  The various methods of treatment have been discussed with the patient and family. After consideration of risks, benefits and other options for treatment, the patient has consented to  Procedure(s) (LRB): CARDIOVERSION (N/A) as a surgical intervention .  The patient's history has been reviewed, patient examined, no change in status, stable for surgery.  I have reviewed the patients' chart and labs.  Questions were answered to the patient's satisfaction.     [name]

## 2012-01-17 ENCOUNTER — Encounter (HOSPITAL_COMMUNITY): Payer: Self-pay | Admitting: Internal Medicine

## 2012-01-17 ENCOUNTER — Ambulatory Visit: Payer: Medicare Other | Admitting: Cardiovascular Disease

## 2012-01-17 DIAGNOSIS — R079 Chest pain, unspecified: Secondary | ICD-10-CM

## 2012-01-17 DIAGNOSIS — I059 Rheumatic mitral valve disease, unspecified: Secondary | ICD-10-CM | POA: Diagnosis not present

## 2012-01-17 DIAGNOSIS — K219 Gastro-esophageal reflux disease without esophagitis: Secondary | ICD-10-CM

## 2012-01-17 MED ORDER — METOPROLOL SUCCINATE ER 25 MG PO TB24: 25.0000 mg | ORAL_TABLET | Freq: Every day | ORAL | Status: DC

## 2012-01-17 MED ORDER — RIVAROXABAN 20 MG PO TABS: 20.0000 mg | ORAL_TABLET | Freq: Every day | ORAL | Status: DC

## 2012-01-17 MED ORDER — RIVAROXABAN 20 MG PO TABS
20.0000 mg | ORAL_TABLET | Freq: Every day | ORAL | Status: DC
  Filled 2012-01-17: qty 1

## 2012-01-17 MED ORDER — PANTOPRAZOLE SODIUM 40 MG PO TBEC
40.0000 mg | DELAYED_RELEASE_TABLET | Freq: Every morning | ORAL | Status: DC
  Administered 2012-01-17: 40 mg via ORAL

## 2012-01-17 NOTE — Progress Notes (Signed)
Patient d/c'd before CSW was able to provide patient with AD paperwork. CSW received referral the day of d/c.   Sabino Niemann, MSW, [employer] [phone]

## 2012-01-17 NOTE — Progress Notes (Signed)
Subjective: A little swimmy.  No CP or SOB> Objective: Filed Vitals:   01/16/12 1428 01/16/12 1812 01/16/12 2053 01/17/12 0500  BP: 92/64 93/50 111/62 91/56  Pulse: 63 69 58 56  Temp:  98.4 F (36.9 C) 98.4 F (36.9 C) 98.1 F (36.7 C)  TempSrc:  Oral Oral Oral  Resp:  22 20 20   Height:      Weight:    132 lb 12.8 oz (60.238 kg)  SpO2:  100% 100% 100%   Weight change: -3 lb 0.1 oz (-1.362 kg)  Intake/Output Summary (Last 24 hours) at 01/17/12 0930 Last data filed at 01/17/12 0839  Gross per 24 hour  Intake   1080 ml  Output    600 ml  Net    480 ml    General: Alert, awake, oriented x3, in no acute distress Neck:  JVP is normal Heart: Regular rate and rhythm, without murmurs, rubs, gallops.  Lungs: Clear to auscultation.  No rales or wheezes. Exemities:  No edema.   Neuro: Grossly intact, nonfocal.  Tele:  SB  50s  Lab Results: No results found for this or any previous visit (from the past 24 hour(s)).  Studies/Results: No results found.  Medications: Reviewed.   Patient Active Hospital Problem List: Atrial fibrillation with rapid ventricular response (01/16/2012)   Assessment: COnverted yesterday to SR.  WIll back down on metoprolol  Was on XL 25 at home   Will do that .  At 50 she felt very sluggish in past. DIscussed afib and progression of Rx.  She will follow up in [place] with Dr. Kirke Corin in 1 to 1 1/2 wks.   Will start Xarelto. MITRAL VALVE PROLAPSE (12/29/2008)   Assessment: Get echo.  Chest pain (02/08/2011)   Assessment: Resolved  I think related to rate.  I am not convinced of active ischemia.  I would recomm d/c asa as pt has signif GI history and also bleeds easily.  Follow  Treat empirically for reflux.      LOS: 2 days   Dietrich Pates 01/17/2012, 9:30 AM

## 2012-01-17 NOTE — Discharge Summary (Signed)
Discharge Summary   Patient ID: [name] MRN: [id number], DOB/AGE: 1946-04-24 66 y.o.  Primary MD: Lyndon Code, MD Primary Cardiologist: Dr. Kirke Corin in [place] Admit date: 01/15/2012 D/C date:     01/17/2012      Primary Discharge Diagnoses:  1. Atrial Fibrillation w/ RVR  - Newly diagnosed this admission  - DCCV on 01/16/12 with return to NSR  - CHADS2 score 0. Initiated on short term Xarelto  - Echo without valvular abnls  2. Chest Pain  - No objective evidence of ischemia (normal myoview 2012, no WMAs on echo this admission)  - Felt related to #1  - ASA dc'd due to significant h/o GI bleed  3. H/o Mitral Valve Prolapse  - Echo this admission did not reveal MVP, only mild MR.  Secondary Discharge Diagnoses:  1. Hyperlipidemia  2. Tobacco Abuse 3. GERD  4. Collagenous colitis  5. Hiatal hernia 6. Schatzki's ring  7. IBS  8. Internal hemorrhoids  9. Squamous cell carcinoma of skin  10. Arthritis  11. Osteoporosis  12. Cataract  13. Shoulder surgery   14. Appendectomy   15. Vaginal hysterectomy   16. Tonsillectomy   17. Ovarian cysts    Allergies Allergies  Allergen Reactions  . Aspirin Other (See Comments)    Aggravates Acid Reflux    Diagnostic Studies/Procedures:   01/16/12 - DCCV  01/17/12 - 2D Echocardiogram Study Conclusions: - Left ventricle: The cavity size was normal. Wall thickness was normal. Systolic function was normal. The estimated ejection fraction was in the range of 55% to 60%. Wall motion was normal; there were no regional wall motion abnormalities. - Mitral valve: Mild regurgitation.  History of Present Illness: 66 y.o. female w/ the above medical problems who presented to [facility] with complaints of chest pressure and palpitations and subsequently transferred to [facility] on 01/15/12 for a.fib w/ rvr.  Hospital Course: EKG revealed A.Fib 105bpm. Labs were significant for normal DDimer and cardiac enzymes. She  received IV lopressor with reduction in rate, but remained in A.Fib and therefore requested transfer to [facility] to be evaluated by cardiology.    She was placed on Lovenox and her home dose of metoprolol was increased to 50mg  bid with plans for DCCV if she did not spontaneously convert to sinus rhythm. Her chest pain resolved with rate control. With negative enzymes and recent normal myoview it was not felt her chest pain was ischemic in nature and did not require further ischemic evaluation. She remained in a.fib with variable rates. As it had been less than 48hrs it was felt she could safely undergo DCCV which was performed on 01/16/12 with return to NSR. Recommendations were made for 4 weeks of Xarelto post cardioversion. She tolerated the procedure well without complications. Echo revealed nl LV systolic fxn, EF 62-95%, no WMAs, no MVP, mild MR. She remained in NSR with controlled rates. Metoprolol was decreased back down to home dose of 25mg  daily as 50mg  has made her felt sluggish in the past.   She was seen and evaluated by Dr. Tenny Craw who felt she was stable for discharge home with plans for follow up as scheduled below.  Discharge Vitals: Blood pressure 102/55, pulse 62, temperature 98 F (36.7 C), temperature source Oral, resp. rate 20, height 5\' 8"  (1.727 m), weight 132 lb 12.8 oz (60.238 kg), SpO2 100.00%.  Labs: Component Value Date   WBC 7.2 01/16/2012   HGB 12.7 01/16/2012   HCT 38.0 01/16/2012  MCV 90.0 01/16/2012   PLT 169 01/16/2012    Lab 01/16/12 0519  NA 141  K 4.2  CL 107  CO2 25  BUN 16  CREATININE 0.87  CALCIUM 8.6  GLUCOSE 81     01/16/2012 05:19  TSH 2.759    Discharge Medications   Medication List  As of 01/17/2012  3:42 PM   TAKE these medications         ALPRAZolam 0.5 MG tablet   Commonly known as: XANAX   Take 0.5 mg by mouth at bedtime as needed. For anxiety      famotidine 40 MG tablet   Commonly known as: PEPCID   Take 1 tablet (40 mg total) by  mouth 2 (two) times daily.      GAS-X PO   Take 1 tablet by mouth as needed. For gas      metoprolol succinate 25 MG 24 hr tablet   Commonly known as: TOPROL-XL   Take 1 tablet (25 mg total) by mouth daily. Take with or immediately following a meal.      Rivaroxaban 20 MG Tabs   Commonly known as: XARELTO   Take 1 tablet (20 mg total) by mouth daily with supper.            Disposition   Discharge Orders    Future Appointments: Provider: Department: Dept Phone: Center:   01/28/2012 10:30 AM Iran Ouch, MD [facility] [phone] [facility]     Future Orders Please Complete By Expires   Diet - low sodium heart healthy      Increase activity slowly      Discharge instructions      Comments:   **PLEASE REMEMBER TO BRING ALL OF YOUR MEDICATIONS TO EACH OF YOUR FOLLOW-UP OFFICE VISITS.     Follow-up Information    Follow up with Lorine Bears, MD on 01/28/2012. (10:30)    Contact information:   [facility] [address] [place] [address] [phone]           Outstanding Labs/Studies:  None  Duration of Discharge Encounter: Greater than 30 minutes including physician and PA time.  Signed, ,  PA-C 01/17/2012, 3:42 PM

## 2012-01-18 ENCOUNTER — Telehealth: Payer: Self-pay | Admitting: Cardiovascular Disease

## 2012-01-18 ENCOUNTER — Other Ambulatory Visit (HOSPITAL_COMMUNITY): Payer: Medicare Other

## 2012-01-18 NOTE — Telephone Encounter (Signed)
Please return call to patient at [phone] regarding medication prescribed by discharged physician.  Patient is scheduled to see Kirke Corin for f/u please return call to patient to answer questions.

## 2012-01-18 NOTE — Telephone Encounter (Signed)
Patient called stated her husband went to pick up xarelto prescription at [facility] and could not get it, needs prior authorization.[facility] called xarelto was approved.Spoke to pharmacist at [facility] in [place] he will have xarelto ready for pick up.Patient called was told xarelto approved and [facility] will have ready for pick up.

## 2012-01-23 ENCOUNTER — Encounter: Payer: Self-pay | Admitting: Cardiovascular Disease

## 2012-01-23 ENCOUNTER — Telehealth: Payer: Self-pay | Admitting: Cardiovascular Disease

## 2012-01-23 ENCOUNTER — Ambulatory Visit (INDEPENDENT_AMBULATORY_CARE_PROVIDER_SITE_OTHER): Payer: Medicare Other | Admitting: Cardiovascular Disease

## 2012-01-23 VITALS — BP 102/70 | HR 56 | Ht 68.0 in | Wt 133.0 lb

## 2012-01-23 DIAGNOSIS — R002 Palpitations: Secondary | ICD-10-CM | POA: Diagnosis not present

## 2012-01-23 DIAGNOSIS — I4891 Unspecified atrial fibrillation: Secondary | ICD-10-CM | POA: Diagnosis not present

## 2012-01-23 DIAGNOSIS — F17200 Nicotine dependence, unspecified, uncomplicated: Secondary | ICD-10-CM | POA: Diagnosis not present

## 2012-01-23 DIAGNOSIS — IMO0001 Reserved for inherently not codable concepts without codable children: Secondary | ICD-10-CM | POA: Insufficient documentation

## 2012-01-23 MED ORDER — FLECAINIDE ACETATE 50 MG PO TABS: 50.0000 mg | ORAL_TABLET | Freq: Two times a day (BID) | ORAL | Status: DC

## 2012-01-23 NOTE — Patient Instructions (Addendum)
Please start flecainide 50 mg in the AM and PM for atrial fibrillation  Please call us if you have new issues that need to be addressed before your next appt.  Your physician wants you to follow-up in: 1 months.  You will receive a reminder letter in the mail two months in advance. If you don't receive a letter, please call our office to schedule the follow-up appointment.

## 2012-01-23 NOTE — Assessment & Plan Note (Signed)
Atrial relation with RVR last week requiring cardioversion. Arrhythmia last night lasting 20 minutes concerning for atrial fibrillation. We will start flecainide 50 mg twice a day with close followup.

## 2012-01-23 NOTE — Progress Notes (Signed)
Patient ID: [name], female    DOB: 10/29/1945, 66 y.o.   MRN: [id number]  HPI Comments: [name] is a 66 year old woman with a history of palpitations and tachycardia who presents for routine followup.  She had recent admission to [facility] atrial fibrillation with RVR that started last week. This required cardioversion last Wednesday.  she was discharged on anticoagulation, xarelto.  She reports that she was doing well but did have an additional episode of tachycardia concerning for atrial fibrillation last night after climbing some stairs. Symptoms lasted 15-20 minutes . Eventually, her rhythm broke  And went back to normal. She is very concerned that this will continue as she is a very active person . She has been on metoprolol for a long period of time . She has stopped smoking in the past week . She does report drinking more coffee recently.  Recent echocardiogram done at [facility] 01/17/2012 - Left ventricle: The cavity size was normal. Wall thickness was normal. Systolic function was normal. The estimated   ejection fraction was in the range of 55% to 60%. Wall motion was normal; there were no regional wall motion abnormalities. - Mitral valve: Mild regurgitation.  EKG shows normal sinus rhythm with rate of 56 beats per minute, no significant ST or T wave changes       Outpatient Encounter Prescriptions as of 01/23/2012  Medication Sig Dispense Refill  . ALPRAZolam (XANAX) 0.5 MG tablet Take 0.5 mg by mouth at bedtime as needed. For anxiety      . famotidine (PEPCID) 40 MG tablet Take 1 tablet (40 mg total) by mouth 2 (two) times daily.  60 tablet  11  . metoprolol succinate (TOPROL-XL) 25 MG 24 hr tablet Take 1 tablet (25 mg total) by mouth daily. Take with or immediately following a meal.  30 tablet  6  . Rivaroxaban (XARELTO) 20 MG TABS Take 1 tablet (20 mg total) by mouth daily with supper.  30 tablet  2  . Simethicone (GAS-X PO) Take 1 tablet by mouth as needed. For gas        . flecainide (TAMBOCOR) 50 MG tablet Take 1 tablet (50 mg total) by mouth 2 (two) times daily.  60 tablet  6     Review of Systems  Constitutional: Negative.   HENT: Negative.   Eyes: Negative.   Cardiovascular: Positive for palpitations.  Gastrointestinal: Negative.   Musculoskeletal: Negative.   Skin: Negative.   Neurological: Negative.   Hematological: Negative.   Psychiatric/Behavioral: Negative.   All other systems reviewed and are negative.    BP 102/70  Pulse 56  Ht 5\' 8"  (1.727 m)  Wt 133 lb (60.328 kg)  BMI 20.22 kg/m2  Physical Exam  Nursing note and vitals reviewed. Constitutional: She is oriented to person, place, and time. She appears well-developed and well-nourished.  HENT:  Head: Normocephalic.  Nose: Nose normal.  Mouth/Throat: Oropharynx is clear and moist.  Eyes: Conjunctivae are normal. Pupils are equal, round, and reactive to light.  Neck: Normal range of motion. Neck supple. No JVD present.  Cardiovascular: Normal rate, regular rhythm, S1 normal, S2 normal, normal heart sounds and intact distal pulses.  Exam reveals no gallop and no friction rub.   No murmur heard. Pulmonary/Chest: Effort normal and breath sounds normal. No respiratory distress. She has no wheezes. She has no rales. She exhibits no tenderness.  Abdominal: Soft. Bowel sounds are normal. She exhibits no distension. There is no tenderness.  Musculoskeletal: Normal range of motion. She exhibits no edema and no tenderness.  Lymphadenopathy:    She has no cervical adenopathy.  Neurological: She is alert and oriented to person, place, and time. Coordination normal.  Skin: Skin is warm and dry. No rash noted. No erythema.  Psychiatric: She has a normal mood and affect. Her behavior is normal. Judgment and thought content normal.         Assessment and Plan

## 2012-01-23 NOTE — Telephone Encounter (Signed)
Pt says she had cardioversion last Wednesday at [facility]. She was d/c home Thursday.  She has been asymptomatic since then, until last night and this am.  She says HR goes "very high" with the least bit of exertion and is very irregular as well.  She thinks she may be back in atrial fib but she is symptomatic.  She only takes metoprolol 25 mg qd.  She is also c/o chest/back pain. She wonders if this is where paddles were placed during cardioversion.  Also c/o worsening acid reflux.  She is planning on going out of town later today/tomm for the holiday.  I encouraged her to be seen today before going out of town.  It may be that all we need to do is increase metoprolol but hard to tell since we dont know true HR.  Dr. Mariah Milling has an opening at 1130. She will come at that time.

## 2012-01-23 NOTE — Telephone Encounter (Signed)
Pt called stating that she has been having some increased HR with some excertion. Take 20-30 mins to get things back under control.

## 2012-01-23 NOTE — Assessment & Plan Note (Signed)
We have encouraged her to continue to work on weaning her cigarettes and smoking cessation. She will continue to work on this and does not want any assistance with chantix.  

## 2012-01-28 ENCOUNTER — Other Ambulatory Visit: Payer: Self-pay | Admitting: Internal Medicine

## 2012-01-28 ENCOUNTER — Encounter: Payer: Medicare Other | Admitting: Cardiovascular Disease

## 2012-01-28 DIAGNOSIS — I739 Peripheral vascular disease, unspecified: Secondary | ICD-10-CM | POA: Diagnosis not present

## 2012-01-28 DIAGNOSIS — G4700 Insomnia, unspecified: Secondary | ICD-10-CM | POA: Diagnosis not present

## 2012-01-28 DIAGNOSIS — R5383 Other fatigue: Secondary | ICD-10-CM | POA: Diagnosis not present

## 2012-01-28 DIAGNOSIS — I4891 Unspecified atrial fibrillation: Secondary | ICD-10-CM | POA: Diagnosis not present

## 2012-01-28 DIAGNOSIS — I498 Other specified cardiac arrhythmias: Secondary | ICD-10-CM | POA: Diagnosis not present

## 2012-01-28 DIAGNOSIS — Z1231 Encounter for screening mammogram for malignant neoplasm of breast: Secondary | ICD-10-CM

## 2012-01-28 DIAGNOSIS — I059 Rheumatic mitral valve disease, unspecified: Secondary | ICD-10-CM | POA: Diagnosis not present

## 2012-01-28 DIAGNOSIS — R5381 Other malaise: Secondary | ICD-10-CM | POA: Diagnosis not present

## 2012-01-28 DIAGNOSIS — R079 Chest pain, unspecified: Secondary | ICD-10-CM | POA: Diagnosis not present

## 2012-01-29 ENCOUNTER — Other Ambulatory Visit: Payer: Self-pay | Admitting: Dermatology

## 2012-01-29 DIAGNOSIS — L851 Acquired keratosis [keratoderma] palmaris et plantaris: Secondary | ICD-10-CM | POA: Diagnosis not present

## 2012-01-29 DIAGNOSIS — C44721 Squamous cell carcinoma of skin of unspecified lower limb, including hip: Secondary | ICD-10-CM | POA: Diagnosis not present

## 2012-01-29 DIAGNOSIS — L570 Actinic keratosis: Secondary | ICD-10-CM | POA: Diagnosis not present

## 2012-01-29 DIAGNOSIS — L905 Scar conditions and fibrosis of skin: Secondary | ICD-10-CM | POA: Diagnosis not present

## 2012-01-29 DIAGNOSIS — Q828 Other specified congenital malformations of skin: Secondary | ICD-10-CM | POA: Diagnosis not present

## 2012-01-29 DIAGNOSIS — Z85828 Personal history of other malignant neoplasm of skin: Secondary | ICD-10-CM | POA: Diagnosis not present

## 2012-02-13 ENCOUNTER — Ambulatory Visit: Payer: Medicare Other | Admitting: Cardiovascular Disease

## 2012-02-20 DIAGNOSIS — D485 Neoplasm of uncertain behavior of skin: Secondary | ICD-10-CM | POA: Diagnosis not present

## 2012-02-25 ENCOUNTER — Ambulatory Visit (INDEPENDENT_AMBULATORY_CARE_PROVIDER_SITE_OTHER): Payer: Medicare Other | Admitting: Cardiovascular Disease

## 2012-02-25 ENCOUNTER — Encounter: Payer: Self-pay | Admitting: Cardiovascular Disease

## 2012-02-25 VITALS — BP 92/60 | HR 53 | Ht 68.0 in | Wt 135.5 lb

## 2012-02-25 DIAGNOSIS — I4891 Unspecified atrial fibrillation: Secondary | ICD-10-CM | POA: Diagnosis not present

## 2012-02-25 DIAGNOSIS — R0602 Shortness of breath: Secondary | ICD-10-CM

## 2012-02-25 DIAGNOSIS — F419 Anxiety disorder, unspecified: Secondary | ICD-10-CM | POA: Insufficient documentation

## 2012-02-25 DIAGNOSIS — F411 Generalized anxiety disorder: Secondary | ICD-10-CM | POA: Diagnosis not present

## 2012-02-25 NOTE — Progress Notes (Signed)
Patient ID: [name], female    DOB: 1945-08-16, 66 y.o.   MRN: [id number]  HPI Comments: [name] is a 66 year old woman with a history of palpitations and tachycardia, atrial fibrillation who presents for routine followup.  Several episodes of atrial fibrillation, one requiring cardioversion. Previous smoking history  She reports that she has done well on flecainide and metoprolol. No significant arrhythmia. Occasional fluttering for several seconds. Blood pressure and heart rate typically run low. She is very nervous about previous history of atrial fibrillation and has been minimizing her activity/   echocardiogram done at [facility] 01/17/2012 - Left ventricle: The cavity size was normal. Wall thickness was normal. Systolic function was normal. The estimated   ejection fraction was in the range of 55% to 60%. Wall motion was normal; there were no regional wall motion abnormalities. - Mitral valve: Mild regurgitation.  EKG shows normal sinus rhythm with rate of 53 beats per minute, no significant ST or T wave changes, incomplete right bundle branch block       Outpatient Encounter Prescriptions as of 02/25/2012  Medication Sig Dispense Refill  . ALPRAZolam (XANAX) 0.5 MG tablet Take 0.5 mg by mouth at bedtime as needed. For anxiety      . famotidine (PEPCID) 40 MG tablet Take 1 tablet (40 mg total) by mouth 2 (two) times daily.  60 tablet  11  . flecainide (TAMBOCOR) 50 MG tablet Take 1 tablet (50 mg total) by mouth 2 (two) times daily.  60 tablet  6  . metoprolol succinate (TOPROL-XL) 25 MG 24 hr tablet Take 1 tablet (25 mg total) by mouth daily. Take with or immediately following a meal.  30 tablet  6  . Rivaroxaban (XARELTO) 20 MG TABS Take 1 tablet (20 mg total) by mouth daily with supper.  30 tablet  2  . Simethicone (GAS-X PO) Take 1 tablet by mouth as needed. For gas        Review of Systems  Constitutional: Negative.   HENT: Negative.   Eyes: Negative.     Respiratory: Negative.   Cardiovascular: Positive for palpitations.  Gastrointestinal: Negative.   Musculoskeletal: Negative.   Skin: Negative.   Neurological: Negative.   Hematological: Negative.   Psychiatric/Behavioral: Negative.   All other systems reviewed and are negative.    BP 92/60  Pulse 53  Ht 5\' 8"  (1.727 m)  Wt 135 lb 8 oz (61.462 kg)  BMI 20.60 kg/m2  Physical Exam  Nursing note and vitals reviewed. Constitutional: She is oriented to person, place, and time. She appears well-developed and well-nourished.  HENT:  Head: Normocephalic.  Nose: Nose normal.  Mouth/Throat: Oropharynx is clear and moist.  Eyes: Conjunctivae are normal. Pupils are equal, round, and reactive to light.  Neck: Normal range of motion. Neck supple. No JVD present.  Cardiovascular: Normal rate, regular rhythm, S1 normal, S2 normal, normal heart sounds and intact distal pulses.  Exam reveals no gallop and no friction rub.   No murmur heard. Pulmonary/Chest: Effort normal and breath sounds normal. No respiratory distress. She has no wheezes. She has no rales. She exhibits no tenderness.  Abdominal: Soft. Bowel sounds are normal. She exhibits no distension. There is no tenderness.  Musculoskeletal: Normal range of motion. She exhibits no edema and no tenderness.  Lymphadenopathy:    She has no cervical adenopathy.  Neurological: She is alert and oriented to person, place, and time. Coordination normal.  Skin: Skin is warm and dry. No rash  noted. No erythema.  Psychiatric: She has a normal mood and affect. Her behavior is normal. Judgment and thought content normal.         Assessment and Plan

## 2012-02-25 NOTE — Assessment & Plan Note (Signed)
No suggestion of recurrent atrial fibrillation. We have suggested she stay on her current medications. She is asymptomatic from her low heart rate and low blood pressure.

## 2012-02-25 NOTE — Patient Instructions (Addendum)
You are doing well. No medication changes were made.  Please call us if you have new issues that need to be addressed before your next appt.  Your physician wants you to follow-up in: 6 months.  You will receive a reminder letter in the mail two months in advance. If you don't receive a letter, please call our office to schedule the follow-up appointment.   

## 2012-02-25 NOTE — Assessment & Plan Note (Signed)
Anxiety stems from previous history of atrial fibrillation. We have encouraged her to increase her physical activity.

## 2012-03-06 ENCOUNTER — Encounter (HOSPITAL_COMMUNITY): Payer: Self-pay | Admitting: Emergency Medicine

## 2012-03-06 ENCOUNTER — Emergency Department (HOSPITAL_COMMUNITY)
Admission: EM | Admit: 2012-03-06 | Discharge: 2012-03-06 | Disposition: A | Discharge status: Home / Self Care | Payer: Medicare Other | Attending: Emergency Medicine | Admitting: Emergency Medicine

## 2012-03-06 DIAGNOSIS — M810 Age-related osteoporosis without current pathological fracture: Secondary | ICD-10-CM | POA: Diagnosis not present

## 2012-03-06 DIAGNOSIS — Z888 Allergy status to other drugs, medicaments and biological substances status: Secondary | ICD-10-CM | POA: Diagnosis not present

## 2012-03-06 DIAGNOSIS — K055 Other periodontal diseases: Secondary | ICD-10-CM | POA: Insufficient documentation

## 2012-03-06 DIAGNOSIS — Z87891 Personal history of nicotine dependence: Secondary | ICD-10-CM | POA: Insufficient documentation

## 2012-03-06 DIAGNOSIS — Z842 Family history of other diseases of the genitourinary system: Secondary | ICD-10-CM | POA: Diagnosis not present

## 2012-03-06 DIAGNOSIS — Z833 Family history of diabetes mellitus: Secondary | ICD-10-CM | POA: Insufficient documentation

## 2012-03-06 DIAGNOSIS — Z8489 Family history of other specified conditions: Secondary | ICD-10-CM | POA: Insufficient documentation

## 2012-03-06 DIAGNOSIS — Z85828 Personal history of other malignant neoplasm of skin: Secondary | ICD-10-CM | POA: Insufficient documentation

## 2012-03-06 DIAGNOSIS — Z800 Family history of malignant neoplasm of digestive organs: Secondary | ICD-10-CM | POA: Insufficient documentation

## 2012-03-06 DIAGNOSIS — K589 Irritable bowel syndrome without diarrhea: Secondary | ICD-10-CM | POA: Insufficient documentation

## 2012-03-06 DIAGNOSIS — I059 Rheumatic mitral valve disease, unspecified: Secondary | ICD-10-CM | POA: Insufficient documentation

## 2012-03-06 DIAGNOSIS — Z7901 Long term (current) use of anticoagulants: Secondary | ICD-10-CM | POA: Insufficient documentation

## 2012-03-06 DIAGNOSIS — Z91040 Latex allergy status: Secondary | ICD-10-CM | POA: Insufficient documentation

## 2012-03-06 DIAGNOSIS — I4891 Unspecified atrial fibrillation: Secondary | ICD-10-CM

## 2012-03-06 DIAGNOSIS — I498 Other specified cardiac arrhythmias: Secondary | ICD-10-CM | POA: Diagnosis not present

## 2012-03-06 DIAGNOSIS — R001 Bradycardia, unspecified: Secondary | ICD-10-CM

## 2012-03-06 DIAGNOSIS — K219 Gastro-esophageal reflux disease without esophagitis: Secondary | ICD-10-CM | POA: Diagnosis not present

## 2012-03-06 DIAGNOSIS — K068 Other specified disorders of gingiva and edentulous alveolar ridge: Secondary | ICD-10-CM

## 2012-03-06 LAB — CBC
Platelets: 174 10*3/uL (ref 150–400)
RBC: 4.1 MIL/uL (ref 3.87–5.11)
WBC: 6.4 10*3/uL (ref 4.0–10.5)

## 2012-03-06 LAB — PROTIME-INR
INR: 1.42 (ref 0.00–1.49)
Prothrombin Time: 17.6 seconds — ABNORMAL HIGH (ref 11.6–15.2)

## 2012-03-06 LAB — APTT: aPTT: 34 seconds (ref 24–37)

## 2012-03-06 MED ORDER — METOPROLOL SUCCINATE ER 25 MG PO TB24: 12.5000 mg | ORAL_TABLET | Freq: Every day | ORAL | Status: DC

## 2012-03-06 NOTE — Consult Note (Signed)
CARDIOLOGY CONSULT NOTE    Patient ID: [name] MRN: [id number] DOB/AGE: [birth year] 67 y.o.  Admit date: 03/06/2012 Referring Physician: Rubin Clark Primary Physician: Heather Code, MD Primary Cardiologist:  [name] Reason for Consultation:  PAF Bleeding on xarelto  Active Problems:  * No active hospital problems. *    HPI:  66 yo Dr [name] Just saw him 10 days ago.  [place] score 0.  DCC on 6/26 for afib.  Echo normal EF MVP mild MR.  Normal myovue 2012 with no  History of ischemic heart disease.  Came to ER this morning with bleeding from mouth.  Appears to be from upper molar/gums.  Seems to be controlled With packing.  Has not seen dentist recently.  She gets occasional flip flops but no sustained arrythmia post cardioversion.  Has been instructed in past On pill in pocket flecainide  In ER HR mid 40's on beta blocker Was 53 when she saw Dr [name] in office.  No other bleeding issues. ? Distant history of GI  Bleeding ans does not take aspirin because of this.    @ROS @ All other systems reviewed and negative except as noted above  Past Medical History  Diagnosis Date  . Mitral valve disorders     a. 12/2011 Echo: EF 55-60%, NRWMA, No evidence of MVP, Mild MR  . Collagenous colitis   . Osteoporosis   . GERD (gastroesophageal reflux disease)   . IBS (irritable bowel syndrome)   . Internal hemorrhoids 2010    Colon-Dr. Mechele Clark   . Schatzki's ring 2010     EGD- Dr. Mechele Clark   . Hiatal hernia 2004  . Cataract   . Hyperlipidemia   . Midsternal chest pain     a. 01/2011 Ex Mv: EF 68%, No ischemia.  . Squamous cell carcinoma of skin     multiple  . Heart murmur   . Atrial fibrillation     a. diagnosed 12/2011  . Migraine 01/16/12    "none now for several years"  . Arthritis     "fingers; right shoulder"  . Kidney stones     Family History  Problem Relation Age of Onset  . Colon cancer Neg Hx   . Heart disease Father     A Fib and CHF - died in Mid 14  . Diabetes  Father   . Kidney disease Father   . Irritable bowel syndrome Father   . Alzheimer's disease Mother     Died in [place] 89's.    History   Social History  . Marital Status: Married    Spouse Name: N/A    Number of Children: N/A  . Years of Education: N/A   Occupational History  . Not on file.   Social History Main Topics  . Smoking status: Former Smoker -- 0.1 packs/day for 40 years    Types: Cigarettes    Quit date: 01/16/2012  . Smokeless tobacco: Never Used  . Alcohol Use: No  . Drug Use: No  . Sexually Active: Not on file   Other Topics Concern  . Not on file   Social History Narrative   Lives in [place] with husband.    Past Surgical History  Procedure Date  . Colonoscopy 03/21/2004    normal  . Upper gastrointestinal endoscopy 06/30/2003    w/dilation, hiatal hernia  . Colonoscopy 02/07/2009    internal hemorrhoids  . Upper gastrointestinal endoscopy 02/07/2009, 06/19/11    2010 - mild schatzki ring/dilated  2012 -  normal, dilated 54 Fr  . Vaginal hysterectomy 1972  . Tonsillectomy and adenoidectomy 1975  . Appendectomy 1963  . Ovarian cyst removal 1990's  . Shoulder arthroscopy w/ rotator cuff repair ~ 2005    right  . Breast cyst aspiration     "several; both sides"  . Squamous cell carcinoma excision     "10-12 removed; all over my body - nose, predominately legs"  . Cystoscopy w/ stone manipulation 1980's  . Cardioversion 01/16/2012    Procedure: CARDIOVERSION;  Surgeon: Heather Maw, MD;  Location: [facility] OR;  Service: Cardiovascular;  Laterality: N/A;  . Cardioversion 01/16/2012    Procedure: CARDIOVERSION;  Surgeon: Heather Maw, MD;  Location: [facility] OR;  Service: Cardiovascular;  Laterality: N/A;  . Leg surgery     skin cancer removal            Physical Exam:   Affect appropriate Healthy:  appears stated age HEENT: small bleed in left upper molar near gum line Neck supple with no adenopathy JVP normal no bruits no thyromegaly Lungs clear with  no wheezing and good diaphragmatic motion Heart:  S1/S2 no murmur, no rub, gallop or click PMI normal Abdomen: benighn, BS positve, no tenderness, no AAA no bruit.  No HSM or HJR Distal pulses intact with no bruits No edema Neuro non-focal Skin warm and dry No muscular weakness   Labs:   Lab Results  Component Value Date   WBC 6.4 03/06/2012   HGB 12.2 03/06/2012   HCT 36.3 03/06/2012   MCV 88.5 03/06/2012   PLT 174 03/06/2012      Radiology: No results found.  EKG: Sinus bradycardia othewise normal   ASSESSMENT AND PLAN:  Anticoagulation:  Stop xarelto.  F/U with Dr [name] next week.  It is not clear to me since she is already about 6 weeks post [facility] That it will need to be restarted since she has ot had recurrence of PAF and [place] score is 0 Dental:  Shold see if Dr [name] can see in ER  Encouraged patient and family to f/u with dentist Bradycardia:  Decrease Toprol to 12.5 daily  Will arrange outpatient f/u with Dr [name] next week in [place]  Signed: Charlton Clark 03/06/2012, 9:19 AM

## 2012-03-06 NOTE — ED Notes (Signed)
PT. REPORTS LEFT UPPER GUM BLEEDING ONSET LAST NIGHT , DENIES INJURY , STATES TAKING BLOOD THINNER FOR A-FIB . [name]).

## 2012-03-06 NOTE — ED Notes (Addendum)
Pt states she started tasting blood around 11:00pm in her mouth. Pt states she tried rinsing her mouth off but the blood continued. Pt denies injury, pt states mouth is sore. Pt states the bleeding has happened before. Pt states she had more than four clots coming from her mouth and that they are stringing. Pt states that she was recently diagnosis with A-fib June 25th by her Cardiologist.

## 2012-03-06 NOTE — ED Provider Notes (Addendum)
History     CSN: [id number]  Arrival date & time 03/06/12  [id number]   First MD Initiated Contact with Patient 03/06/12 [phone]      Chief Complaint  Patient presents with  . Coagulation Disorder    (Consider location/radiation/quality/duration/timing/severity/associated sxs/prior treatment) The history is provided by the patient.   patient's had bleeding in her mouth since around 11 PM last night. She states it just started while she was watching TV. She is on Xarelto for atrial fibrillation. She states she's been bleeding pretty constantly since last night. She does bruise easily but has had no other bleeding. No lightheadedness or dizziness. No trouble breathing.  Past Medical History  Diagnosis Date  . Mitral valve disorders     a. 12/2011 Echo: EF 55-60%, NRWMA, No evidence of MVP, Mild MR  . Collagenous colitis   . Osteoporosis   . GERD (gastroesophageal reflux disease)   . IBS (irritable bowel syndrome)   . Internal hemorrhoids 2010    Colon-Dr. Mechele Collin   . Schatzki's ring 2010     EGD- Dr. Mechele Collin   . Hiatal hernia 2004  . Cataract   . Hyperlipidemia   . Midsternal chest pain     a. 01/2011 Ex Mv: EF 68%, No ischemia.  . Squamous cell carcinoma of skin     multiple  . Heart murmur   . Atrial fibrillation     a. diagnosed 12/2011  . Migraine 01/16/12    "none now for several years"  . Arthritis     "fingers; right shoulder"  . Kidney stones     Past Surgical History  Procedure Date  . Colonoscopy 03/21/2004    normal  . Upper gastrointestinal endoscopy 06/30/2003    w/dilation, hiatal hernia  . Colonoscopy 02/07/2009    internal hemorrhoids  . Upper gastrointestinal endoscopy 02/07/2009, 06/19/11    2010 - mild schatzki ring/dilated  2012 - normal, dilated 54 Fr  . Vaginal hysterectomy 1972  . Tonsillectomy and adenoidectomy 1975  . Appendectomy 1963  . Ovarian cyst removal 1990's  . Shoulder arthroscopy w/ rotator cuff repair ~ 2005    right  . Breast cyst  aspiration     "several; both sides"  . Squamous cell carcinoma excision     "10-12 removed; all over my body - nose, predominately legs"  . Cystoscopy w/ stone manipulation 1980's  . Cardioversion 01/16/2012    Procedure: CARDIOVERSION;  Surgeon: Marinus Maw, MD;  Location: [facility] OR;  Service: Cardiovascular;  Laterality: N/A;  . Cardioversion 01/16/2012    Procedure: CARDIOVERSION;  Surgeon: Marinus Maw, MD;  Location: [facility] OR;  Service: Cardiovascular;  Laterality: N/A;  . Leg surgery     skin cancer removal    Family History  Problem Relation Age of Onset  . Colon cancer Neg Hx   . Heart disease Father     A Fib and CHF - died in Mid 77  . Diabetes Father   . Kidney disease Father   . Irritable bowel syndrome Father   . Alzheimer's disease Mother     Died in [place] 67's.    History  Substance Use Topics  . Smoking status: Former Smoker -- 0.1 packs/day for 40 years    Types: Cigarettes    Quit date: 01/16/2012  . Smokeless tobacco: Never Used  . Alcohol Use: No    OB History    Grav Para Term Preterm Abortions TAB SAB Ect Mult Living  Review of Systems  Constitutional: Negative for activity change and appetite change.  HENT: Negative for nosebleeds, sore throat, mouth sores, trouble swallowing and neck stiffness.   Eyes: Negative for pain.  Respiratory: Negative for chest tightness and shortness of breath.   Cardiovascular: Negative for chest pain and leg swelling.  Gastrointestinal: Negative for nausea, vomiting, abdominal pain and diarrhea.  Genitourinary: Negative for flank pain.  Musculoskeletal: Negative for back pain.  Skin: Negative for rash.  Neurological: Negative for weakness, numbness and headaches.  Hematological: Bruises/bleeds easily.  Psychiatric/Behavioral: Negative for behavioral problems.    Allergies  Latex; Adhesive; and Aspirin  Home Medications   Current Outpatient Rx  Name Route Sig Dispense Refill  . ALPRAZOLAM 0.5  MG PO TABS Oral Take 0.5 mg by mouth at bedtime as needed. For anxiety    . FAMOTIDINE 40 MG PO TABS Oral Take 1 tablet (40 mg total) by mouth 2 (two) times daily. 60 tablet 11  . FLECAINIDE ACETATE 50 MG PO TABS Oral Take 1 tablet (50 mg total) by mouth 2 (two) times daily. 60 tablet 6  . MUPIROCIN 2 % EX OINT Topical Apply 1 application topically daily. Apply to scabs    . RIVAROXABAN 20 MG PO TABS Oral Take 1 tablet (20 mg total) by mouth daily with supper. 30 tablet 2  . GAS-X PO Oral Take 1 tablet by mouth as needed. For gas    . METOPROLOL SUCCINATE ER 25 MG PO TB24 Oral Take 0.5 tablets (12.5 mg total) by mouth daily. Take with or immediately following a meal. 30 tablet 6    BP 104/52  Pulse 45  Temp 97.7 F (36.5 C) (Oral)  Resp 17  SpO2 100%  Physical Exam  Constitutional: She appears well-developed and well-nourished.  HENT:  Head: Normocephalic.       Blood clot from left upper jaw between sixth and seventh tooth laterally. No active bleeding.  Eyes: Pupils are equal, round, and reactive to light.  Cardiovascular:       Bradycardia with mildly irregular rhythm.  Pulmonary/Chest: Effort normal.  Abdominal: Soft. Bowel sounds are normal.  Musculoskeletal: Normal range of motion.  Neurological: She is alert.    ED Course  Procedures (including critical care time)  Labs Reviewed  PROTIME-INR - Abnormal; Notable for the following:    Prothrombin Time 17.6 (*)     All other components within normal limits  CBC  APTT  LAB REPORT - SCANNED   No results found.   1. Bleeding gums   2. Bradycardia      Date: 04/09/2012  Rate: 46  Rhythm: sinus bradycardia  QRS Axis: normal  Intervals: normal  ST/T Wave abnormalities: normal  Conduction Disutrbances:none  Narrative Interpretation:   Old EKG Reviewed: unchanged    MDM  Patient patient presented with bleeding from her gums. She is on Xarelto.  bleeding was controlled with Gelfoam. Hemoglobin is normal. She  also had some bradycardia. Patient was seen in the ER by cardiology since the office at been called and they recommended dental followup. Her metoprolol was also decreased.   Juliet Rude. Rubin Payor, MD 03/07/12 1612  Juliet Rude. Rubin Payor, MD 04/09/12 0005

## 2012-03-06 NOTE — ED Notes (Signed)
MD at bedside. 

## 2012-03-07 ENCOUNTER — Telehealth: Payer: Self-pay | Admitting: Cardiovascular Disease

## 2012-03-07 NOTE — Telephone Encounter (Signed)
Patient went to [place] yesterday where she saw one of our MDs per patient.  She went for a severe bleed and she was taken off the Xarelto, also her metoprelol was cut in half during this visit as her heart rate in the ED was 40.  She is now taking 12.5mg .  She was told to follow-up with Dr. Kirke Corin the beginning of next week.  She wants to confirm that he is ok with her staying off the Xarelto until her appointment on Tuesday 03/11/12.  Please call her back at [phone]

## 2012-03-07 NOTE — Telephone Encounter (Signed)
Pt aware Dr. Mariah Milling has reviewed and agrees with Dr. Fabio Bering recommendations. She will follow-up with Dr. Mariah Milling next week.  Reassurance given to patient.  Mylo Red RN

## 2012-03-11 ENCOUNTER — Ambulatory Visit (INDEPENDENT_AMBULATORY_CARE_PROVIDER_SITE_OTHER): Payer: Medicare Other | Admitting: Cardiovascular Disease

## 2012-03-11 ENCOUNTER — Encounter: Payer: Self-pay | Admitting: Cardiovascular Disease

## 2012-03-11 VITALS — BP 102/62 | HR 60 | Ht 68.0 in | Wt 133.5 lb

## 2012-03-11 DIAGNOSIS — I4891 Unspecified atrial fibrillation: Secondary | ICD-10-CM

## 2012-03-11 NOTE — Progress Notes (Signed)
HPI  [name] is a 66 year old woman who is here today for a followup visit. She has history of atrial fibrillation which was diagnosed in June of this year. She underwent cardioversion at that time. She was placed on short-term Xarelto.  She had few episodes of palpitations after that and was started on Flecainide 50 mg twice daily in addition to metoprolol. Since then, she had significant improvement in palpitations. echocardiogram done at [facility] 01/17/2012  - Left ventricle: The cavity size was normal. Wall thickness was normal. Systolic function was normal. The estimated  ejection fraction was in the range of 55% to 60%. Wall motion was normal; there were no regional wall motion abnormalities.  - Mitral valve: Mild regurgitation.  She had 2 bleeding problems recently. One in her leg after a scratch. Last week, she had bleeding from her mouth after she ate peanuts. The bleeding continued for hours and she had to go to the emergency room. She was noted to be bradycardic at that time with heart rate in the 40s. Metoprolol dose was cut in half. Xarelto was stopped . Hemoglobin was relatively stable.    Allergies  Allergen Reactions  . Latex   . Adhesive (Tape) Rash  . Aspirin Other (See Comments)    Aggravates Acid Reflux     Current Outpatient Prescriptions on File Prior to Visit  Medication Sig Dispense Refill  . ALPRAZolam (XANAX) 0.5 MG tablet Take 0.5 mg by mouth at bedtime as needed. For anxiety      . flecainide (TAMBOCOR) 50 MG tablet Take 1 tablet (50 mg total) by mouth 2 (two) times daily.  60 tablet  6  . metoprolol succinate (TOPROL-XL) 25 MG 24 hr tablet Take 0.5 tablets (12.5 mg total) by mouth daily. Take with or immediately following a meal.  30 tablet  6  . mupirocin ointment (BACTROBAN) 2 % Apply 1 application topically as needed. Apply to scabs      . Simethicone (GAS-X PO) Take 1 tablet by mouth as needed. For gas      . DISCONTD: famotidine (PEPCID) 40 MG  tablet Take 1 tablet (40 mg total) by mouth 2 (two) times daily.  60 tablet  11     Past Medical History  Diagnosis Date  . Mitral valve disorders     a. 12/2011 Echo: EF 55-60%, NRWMA, No evidence of MVP, Mild MR  . Collagenous colitis   . Osteoporosis   . GERD (gastroesophageal reflux disease)   . IBS (irritable bowel syndrome)   . Internal hemorrhoids 2010    Colon-Dr. Mechele Collin   . Schatzki's ring 2010     EGD- Dr. Mechele Collin   . Hiatal hernia 2004  . Cataract   . Hyperlipidemia   . Midsternal chest pain     a. 01/2011 Ex Mv: EF 68%, No ischemia.  . Squamous cell carcinoma of skin     multiple  . Heart murmur   . Atrial fibrillation     a. diagnosed 12/2011  . Migraine 01/16/12    "none now for several years"  . Arthritis     "fingers; right shoulder"  . Kidney stones      Past Surgical History  Procedure Date  . Colonoscopy 03/21/2004    normal  . Upper gastrointestinal endoscopy 06/30/2003    w/dilation, hiatal hernia  . Colonoscopy 02/07/2009    internal hemorrhoids  . Upper gastrointestinal endoscopy 02/07/2009, 06/19/11    2010 - mild schatzki ring/dilated  2012 - normal, dilated 54 Fr  . Vaginal hysterectomy 1972  . Tonsillectomy and adenoidectomy 1975  . Appendectomy 1963  . Ovarian cyst removal 1990's  . Shoulder arthroscopy w/ rotator cuff repair ~ 2005    right  . Breast cyst aspiration     "several; both sides"  . Squamous cell carcinoma excision     "10-12 removed; all over my body - nose, predominately legs"  . Cystoscopy w/ stone manipulation 1980's  . Cardioversion 01/16/2012    Procedure: CARDIOVERSION;  Surgeon: Marinus Maw, MD;  Location: [facility] OR;  Service: Cardiovascular;  Laterality: N/A;  . Cardioversion 01/16/2012    Procedure: CARDIOVERSION;  Surgeon: Marinus Maw, MD;  Location: [facility] OR;  Service: Cardiovascular;  Laterality: N/A;  . Leg surgery     skin cancer removal     Family History  Problem Relation Age of Onset  . Colon cancer  Neg Hx   . Heart disease Father     A Fib and CHF - died in Mid 43  . Diabetes Father   . Kidney disease Father   . Irritable bowel syndrome Father   . Alzheimer's disease Mother     Died in [place] 70's.     History   Social History  . Marital Status: Married    Spouse Name: N/A    Number of Children: N/A  . Years of Education: N/A   Occupational History  . Not on file.   Social History Main Topics  . Smoking status: Former Smoker -- 0.1 packs/day for 40 years    Types: Cigarettes    Quit date: 01/16/2012  . Smokeless tobacco: Never Used  . Alcohol Use: No  . Drug Use: No  . Sexually Active: Not on file   Other Topics Concern  . Not on file   Social History Narrative   Lives in [place] with husband.      PHYSICAL EXAM   BP 102/62  Pulse 60  Ht 5\' 8"  (1.727 m)  Wt 133 lb 8 oz (60.555 kg)  BMI 20.30 kg/m2 Constitutional: She is oriented to person, place, and time. She appears well-developed and well-nourished. No distress.  HENT: No nasal discharge.  Head: Normocephalic and atraumatic.  Eyes: Pupils are equal and round. Right eye exhibits no discharge. Left eye exhibits no discharge.  Neck: Normal range of motion. Neck supple. No JVD present. No thyromegaly present.  Cardiovascular: Normal rate, regular rhythm, normal heart sounds. Exam reveals no gallop and no friction rub. No murmur heard.  Pulmonary/Chest: Effort normal and breath sounds normal. No stridor. No respiratory distress. She has no wheezes. She has no rales. She exhibits no tenderness.  Abdominal: Soft. Bowel sounds are normal. She exhibits no distension. There is no tenderness. There is no rebound and no guarding.  Musculoskeletal: Normal range of motion. She exhibits no edema and no tenderness.  Neurological: She is alert and oriented to person, place, and time. Coordination normal.  Skin: Skin is warm and dry. No rash noted. She is not diaphoretic. No erythema. No pallor.  Psychiatric: She has a  normal mood and affect. Her behavior is normal. Judgment and thought content normal.     EKG: normal sinus rhythm with incomplete right bundle branch block.   ASSESSMENT AND PLAN

## 2012-03-11 NOTE — Assessment & Plan Note (Signed)
She is currently in normal sinus rhythm. She is doing better on flecainide with no further palpitations. Her CHADS2 score is 0 thus overall risk of cardioembolic complications is low. Thus, I do think she needs long-term anticoagulation at this time especially with the bleeding complications she had recently. Heart rate is reasonable on current dose of Toprol. No changes today.

## 2012-03-11 NOTE — Patient Instructions (Addendum)
Stop taking Xarelto.  Continue other medications.  Follow up with Dr. Mariah Milling in 3 months.

## 2012-03-25 ENCOUNTER — Ambulatory Visit (INDEPENDENT_AMBULATORY_CARE_PROVIDER_SITE_OTHER): Payer: Medicare Other | Admitting: Internal Medicine

## 2012-03-25 ENCOUNTER — Encounter: Payer: Self-pay | Admitting: Internal Medicine

## 2012-03-25 VITALS — BP 112/60 | HR 60 | Ht 68.0 in | Wt 134.0 lb

## 2012-03-25 DIAGNOSIS — I4891 Unspecified atrial fibrillation: Secondary | ICD-10-CM

## 2012-03-25 DIAGNOSIS — R079 Chest pain, unspecified: Secondary | ICD-10-CM

## 2012-03-25 DIAGNOSIS — R1314 Dysphagia, pharyngoesophageal phase: Secondary | ICD-10-CM

## 2012-03-25 DIAGNOSIS — R1310 Dysphagia, unspecified: Secondary | ICD-10-CM

## 2012-03-25 DIAGNOSIS — R1319 Other dysphagia: Secondary | ICD-10-CM

## 2012-03-25 DIAGNOSIS — K219 Gastro-esophageal reflux disease without esophagitis: Secondary | ICD-10-CM | POA: Diagnosis not present

## 2012-03-25 NOTE — Progress Notes (Signed)
Subjective:    Patient ID: [name], female    DOB: 06-13-1946, 66 y.o.   MRN: [id number]  HPI This is a very pleasant bili white woman with a history of reflux and dysphagia though she's never clearly had a stricture found on barium swallow in 1999 in multiple endoscopic exams. I last saw her in 2012 and performed an EGD with empiric esophageal dilation which helped her dysphagia. She is intolerant of PPI therapy due to diarrhea, she also has a history of collagenous colitis. There is no current problem with diarrhea. She uses Pepcid at least once a day, 40 mg strength. In general she feels like she  This summer she had atrial fibrillation with rapid ventricular response and chest pain that she interpreted as being her reflux pain. She underwent cardioversion successfully and was on Xarelto for several weeks but that was stopped due to oral bleeding and bleeding elsewhere.  She is describing persistent intermittent chest pain and dysphagia that is random and not necessarily associated with any particular triggers. It is really similar to the problems she's had over the years. She is concerned that she will not be able to discern her reflux pain and her pain if she gets atrial fibrillation again though she now does understand how to check her pulse.  Allergies  Allergen Reactions  . Latex   . Adhesive (Tape) Rash  . Aspirin Other (See Comments)    Aggravates Acid Reflux   Outpatient Prescriptions Prior to Visit  Medication Sig Dispense Refill  . ALPRAZolam (XANAX) 0.5 MG tablet Take 0.5 mg by mouth at bedtime as needed. For anxiety      . famotidine (PEPCID) 40 MG tablet Take 40 mg by mouth as needed.      . flecainide (TAMBOCOR) 50 MG tablet Take 1 tablet (50 mg total) by mouth 2 (two) times daily.  60 tablet  6  . metoprolol succinate (TOPROL-XL) 25 MG 24 hr tablet Take 0.5 tablets (12.5 mg total) by mouth daily. Take with or immediately following a meal.  30 tablet  6  . mupirocin  ointment (BACTROBAN) 2 % Apply 1 application topically as needed. Apply to scabs      . Simethicone (GAS-X PO) Take 1 tablet by mouth as needed. For gas       Past Medical History  Diagnosis Date  . Mitral valve disorders     a. 12/2011 Echo: EF 55-60%, NRWMA, No evidence of MVP, Mild MR  . Collagenous colitis   . Osteoporosis   . GERD (gastroesophageal reflux disease)   . IBS (irritable bowel syndrome)   . Internal hemorrhoids 2010    Colon-Dr. Mechele Collin   . Schatzki's ring 2010     EGD- Dr. Mechele Collin   . Hiatal hernia 2004  . Cataract   . Hyperlipidemia   . Midsternal chest pain     a. 01/2011 Ex Mv: EF 68%, No ischemia.  . Squamous cell carcinoma of skin     multiple  . Heart murmur   . Atrial fibrillation     a. diagnosed 12/2011  . Migraine 01/16/12    "none now for several years"  . Arthritis     "fingers; right shoulder"  . Kidney stones    Past Surgical History  Procedure Date  . Upper gastrointestinal endoscopy Multiple    w/dilation, hiatal hernia  . Colonoscopy Multiple  . Vaginal hysterectomy 1972  . Tonsillectomy and adenoidectomy 1975  . Appendectomy 1963  . Ovarian cyst  removal 1990's  . Shoulder arthroscopy w/ rotator cuff repair ~ 2005    right  . Breast cyst aspiration     "several; both sides"  . Squamous cell carcinoma excision     "10-12 removed; all over my body - nose, predominately legs"  . Cystoscopy w/ stone manipulation 1980's  . Cardioversion 01/16/2012    Procedure: CARDIOVERSION;  Surgeon: Marinus Maw, MD;  Location: [facility] OR;  Service: Cardiovascular;  Laterality: N/A;  . Leg surgery     skin cancer removal   History   Social History  . Marital Status: Married                 Social History Main Topics  . Smoking status: Former Smoker -- 0.1 packs/day for 40 years    Types: Cigarettes    Quit date: 01/16/2012  . Smokeless tobacco: Never Used  . Alcohol Use: No  . Drug Use: No    Social History Narrative   Lives in [place] with  husband.   Family History  Problem Relation Age of Onset  . Colon cancer Neg Hx   . Heart disease Father     A Fib and CHF - died in Mid 51  . Diabetes Father   . Kidney disease Father   . Irritable bowel syndrome Father   . Alzheimer's disease Mother     Died in [place] 37's.       Review of Systems As above    Objective:   Physical Exam Thin well-developed well-nourished no acute distress     Assessment & Plan:   1. Esophageal dysphagia   2. Chest pain   3. Atrial fibrillation with rapid ventricular response - currently resolved after cardioversion   4. GERD (gastroesophageal reflux disease)     1. I wonder she doesn't have intermittent esophageal spasm problems. She did apparently have an esophageal ring in 2010 when another gastrologist dilated her. I did not see that though they can be difficult to see at times. The lack of persistent response to dilation suggest that motility could be an issue though she cannot take PPI which would help a chronic esophageal ring stay open after dilation. 2. I have suggested she have an esophageal manometry. She is willing to do so but wants to wait. She's had [facility] to the [facility] and says she will call us back and I've asked her to do so. This will help him symptoms better. We may be a will alter therapy as well and provide symptomatic relief.  I appreciate the opportunity to care for this patient.   CC: Lyndon Code, MDand [name], MD

## 2012-03-25 NOTE — Patient Instructions (Addendum)
Please call us back when you are ready to set up the esophageal manometry test at [phone].   Thank you for choosing me and  Gastroenterology.  Iva Boop, M.D., [facility]

## 2012-03-28 ENCOUNTER — Ambulatory Visit
Admission: RE | Admit: 2012-03-28 | Discharge: 2012-03-28 | Disposition: A | Discharge status: Home / Self Care | Payer: Medicare Other | Source: Ambulatory Visit | Attending: Internal Medicine | Admitting: Internal Medicine

## 2012-03-28 DIAGNOSIS — Z1231 Encounter for screening mammogram for malignant neoplasm of breast: Secondary | ICD-10-CM

## 2012-04-28 DIAGNOSIS — Z1239 Encounter for other screening for malignant neoplasm of breast: Secondary | ICD-10-CM | POA: Diagnosis not present

## 2012-04-28 DIAGNOSIS — N760 Acute vaginitis: Secondary | ICD-10-CM | POA: Diagnosis not present

## 2012-04-28 DIAGNOSIS — R300 Dysuria: Secondary | ICD-10-CM | POA: Diagnosis not present

## 2012-04-28 DIAGNOSIS — G4700 Insomnia, unspecified: Secondary | ICD-10-CM | POA: Diagnosis not present

## 2012-04-28 DIAGNOSIS — I4891 Unspecified atrial fibrillation: Secondary | ICD-10-CM | POA: Diagnosis not present

## 2012-04-28 DIAGNOSIS — Z0000 Encounter for general adult medical examination without abnormal findings: Secondary | ICD-10-CM | POA: Diagnosis not present

## 2012-04-28 DIAGNOSIS — Z124 Encounter for screening for malignant neoplasm of cervix: Secondary | ICD-10-CM | POA: Diagnosis not present

## 2012-04-29 DIAGNOSIS — L570 Actinic keratosis: Secondary | ICD-10-CM | POA: Diagnosis not present

## 2012-04-29 DIAGNOSIS — Z85828 Personal history of other malignant neoplasm of skin: Secondary | ICD-10-CM | POA: Diagnosis not present

## 2012-04-29 DIAGNOSIS — L578 Other skin changes due to chronic exposure to nonionizing radiation: Secondary | ICD-10-CM | POA: Diagnosis not present

## 2012-04-29 DIAGNOSIS — C44721 Squamous cell carcinoma of skin of unspecified lower limb, including hip: Secondary | ICD-10-CM | POA: Diagnosis not present

## 2012-04-29 DIAGNOSIS — L821 Other seborrheic keratosis: Secondary | ICD-10-CM | POA: Diagnosis not present

## 2012-05-12 DIAGNOSIS — E2839 Other primary ovarian failure: Secondary | ICD-10-CM | POA: Diagnosis not present

## 2012-05-21 DIAGNOSIS — L570 Actinic keratosis: Secondary | ICD-10-CM | POA: Diagnosis not present

## 2012-05-28 DIAGNOSIS — L578 Other skin changes due to chronic exposure to nonionizing radiation: Secondary | ICD-10-CM | POA: Diagnosis not present

## 2012-05-28 DIAGNOSIS — Z85828 Personal history of other malignant neoplasm of skin: Secondary | ICD-10-CM | POA: Diagnosis not present

## 2012-05-28 DIAGNOSIS — L570 Actinic keratosis: Secondary | ICD-10-CM | POA: Diagnosis not present

## 2012-05-28 DIAGNOSIS — L98499 Non-pressure chronic ulcer of skin of other sites with unspecified severity: Secondary | ICD-10-CM | POA: Diagnosis not present

## 2012-05-28 DIAGNOSIS — L820 Inflamed seborrheic keratosis: Secondary | ICD-10-CM | POA: Diagnosis not present

## 2012-06-04 DIAGNOSIS — L820 Inflamed seborrheic keratosis: Secondary | ICD-10-CM | POA: Diagnosis not present

## 2012-06-04 DIAGNOSIS — L98499 Non-pressure chronic ulcer of skin of other sites with unspecified severity: Secondary | ICD-10-CM | POA: Diagnosis not present

## 2012-06-04 DIAGNOSIS — R21 Rash and other nonspecific skin eruption: Secondary | ICD-10-CM | POA: Diagnosis not present

## 2012-06-04 DIAGNOSIS — L570 Actinic keratosis: Secondary | ICD-10-CM | POA: Diagnosis not present

## 2012-06-04 DIAGNOSIS — L821 Other seborrheic keratosis: Secondary | ICD-10-CM | POA: Diagnosis not present

## 2012-06-11 DIAGNOSIS — L570 Actinic keratosis: Secondary | ICD-10-CM | POA: Diagnosis not present

## 2012-06-11 DIAGNOSIS — L98499 Non-pressure chronic ulcer of skin of other sites with unspecified severity: Secondary | ICD-10-CM | POA: Diagnosis not present

## 2012-06-11 DIAGNOSIS — L820 Inflamed seborrheic keratosis: Secondary | ICD-10-CM | POA: Diagnosis not present

## 2012-06-13 ENCOUNTER — Encounter: Payer: Self-pay | Admitting: Cardiovascular Disease

## 2012-06-13 ENCOUNTER — Ambulatory Visit (INDEPENDENT_AMBULATORY_CARE_PROVIDER_SITE_OTHER): Payer: Medicare Other | Admitting: Cardiovascular Disease

## 2012-06-13 VITALS — BP 110/60 | HR 57 | Ht 68.0 in | Wt 136.5 lb

## 2012-06-13 DIAGNOSIS — R002 Palpitations: Secondary | ICD-10-CM

## 2012-06-13 DIAGNOSIS — I4891 Unspecified atrial fibrillation: Secondary | ICD-10-CM | POA: Diagnosis not present

## 2012-06-13 DIAGNOSIS — R0602 Shortness of breath: Secondary | ICD-10-CM

## 2012-06-13 NOTE — Assessment & Plan Note (Signed)
Some shortness of breath with exertion. Suspect this could be from mild COPD given long smoking history. We have suggested symptoms get worse, that she call our office for ischemic workup.

## 2012-06-13 NOTE — Assessment & Plan Note (Signed)
No clinical signs concerning for atrial fibrillation. Very brief episodes of palpitations likely ectopy. We will continue current medications.

## 2012-06-13 NOTE — Patient Instructions (Addendum)
You are doing well. No medication changes were made.  Please call us if you have new issues that need to be addressed before your next appt.  Your physician wants you to follow-up in: 6 months.  You will receive a reminder letter in the mail two months in advance. If you don't receive a letter, please call our office to schedule the follow-up appointment.   

## 2012-06-13 NOTE — Progress Notes (Signed)
Patient ID: [name], female    DOB: [birth year], 66 y.o.   MRN: [id number]  HPI Comments: Mrs Vandekamp is a 66 year old woman with a history of palpitations and tachycardia, atrial fibrillation who presents for routine followup.  Several episodes of atrial fibrillation, one requiring cardioversion. Previous smoking history, stopped in 2012  She reports that she has done well on flecainide and metoprolol. She has decreased her metoprolol dose in half secondary to bradycardia . No significant arrhythmia. Occasional fluttering for several seconds. Blood pressure and heart rate typically run low. 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 She reports that her cholesterol is typically high that could cholesterol is also very high. She's not particularly interested in cholesterol medication.   echocardiogram done at [facility] 01/17/2012 - Left ventricle: The cavity size was normal. Wall thickness was normal. Systolic function was normal. The estimated   ejection fraction was in the range of 55% to 60%. Wall motion was normal; there were no regional wall motion abnormalities. - Mitral valve: Mild regurgitation.  EKG shows normal sinus rhythm with rate of 57 beats per minute, no significant ST or T wave changes, incomplete right bundle branch block       Outpatient Encounter Prescriptions as of 06/13/2012  Medication Sig Dispense Refill  . ALPRAZolam (XANAX) 0.5 MG tablet Take 0.5 mg by mouth at bedtime as needed. For anxiety      . famotidine (PEPCID) 40 MG tablet Take 40 mg by mouth as needed.      . flecainide (TAMBOCOR) 50 MG tablet Take 1 tablet (50 mg total) by mouth 2 (two) times daily.  60 tablet  6  . metoprolol succinate (TOPROL-XL) 25 MG 24 hr tablet Take 0.5 tablets (12.5 mg total) by mouth daily. Take with or immediately following a meal.  30 tablet  6  . mupirocin ointment (BACTROBAN) 2 % Apply 1 application topically as needed. Apply to scabs      . Simethicone  (GAS-X PO) Take 1 tablet by mouth as needed. For gas        Review of Systems  Constitutional: Negative.   HENT: Negative.   Eyes: Negative.   Respiratory: Negative.   Cardiovascular: Positive for palpitations.  Gastrointestinal: Negative.   Musculoskeletal: Negative.   Skin: Negative.   Neurological: Negative.   Hematological: Negative.   Psychiatric/Behavioral: Negative.   All other systems reviewed and are negative.    BP 110/60  Pulse 57  Ht 5\' 8"  (1.727 m)  Wt 136 lb 8 oz (61.916 kg)  BMI 20.75 kg/m2  Physical Exam  Nursing note and vitals reviewed. Constitutional: She is oriented to person, place, and time. She appears well-developed and well-nourished.  HENT:  Head: Normocephalic.  Nose: Nose normal.  Mouth/Throat: Oropharynx is clear and moist.  Eyes: Conjunctivae normal are normal. Pupils are equal, round, and reactive to light.  Neck: Normal range of motion. Neck supple. No JVD present.  Cardiovascular: Normal rate, regular rhythm, S1 normal, S2 normal, normal heart sounds and intact distal pulses.  Exam reveals no gallop and no friction rub.   No murmur heard. Pulmonary/Chest: Effort normal and breath sounds normal. No respiratory distress. She has no wheezes. She has no rales. She exhibits no tenderness.  Abdominal: Soft. Bowel sounds are normal. She exhibits no distension. There is no tenderness.  Musculoskeletal: Normal range of motion. She exhibits no edema and no tenderness.  Lymphadenopathy:    She has no cervical  adenopathy.  Neurological: She is alert and oriented to person, place, and time. Coordination normal.  Skin: Skin is warm and dry. No rash noted. No erythema.  Psychiatric: She has a normal mood and affect. Her behavior is normal. Judgment and thought content normal.         Assessment and Plan

## 2012-06-18 DIAGNOSIS — L570 Actinic keratosis: Secondary | ICD-10-CM | POA: Diagnosis not present

## 2012-06-25 DIAGNOSIS — L570 Actinic keratosis: Secondary | ICD-10-CM | POA: Diagnosis not present

## 2012-06-25 DIAGNOSIS — R21 Rash and other nonspecific skin eruption: Secondary | ICD-10-CM | POA: Diagnosis not present

## 2012-06-25 DIAGNOSIS — L98499 Non-pressure chronic ulcer of skin of other sites with unspecified severity: Secondary | ICD-10-CM | POA: Diagnosis not present

## 2012-06-30 ENCOUNTER — Telehealth: Payer: Self-pay

## 2012-06-30 NOTE — Telephone Encounter (Signed)
Pt dropped off form to receive handicap placard Pt says this is necessary d/t intermittent atrial fib and occasional DOE when she goes into atrial fib She admits she does not need this placard at all times, but would like to have it in the case that she is in atrial fib and is symptomatic.  This was signed by Dr. Mariah Milling and placed at [facility] for her to pick up at her convenience

## 2012-07-02 DIAGNOSIS — L578 Other skin changes due to chronic exposure to nonionizing radiation: Secondary | ICD-10-CM | POA: Diagnosis not present

## 2012-07-02 DIAGNOSIS — L570 Actinic keratosis: Secondary | ICD-10-CM | POA: Diagnosis not present

## 2012-07-02 DIAGNOSIS — L98499 Non-pressure chronic ulcer of skin of other sites with unspecified severity: Secondary | ICD-10-CM | POA: Diagnosis not present

## 2012-07-02 DIAGNOSIS — L821 Other seborrheic keratosis: Secondary | ICD-10-CM | POA: Diagnosis not present

## 2012-07-09 DIAGNOSIS — L578 Other skin changes due to chronic exposure to nonionizing radiation: Secondary | ICD-10-CM | POA: Diagnosis not present

## 2012-07-09 DIAGNOSIS — R21 Rash and other nonspecific skin eruption: Secondary | ICD-10-CM | POA: Diagnosis not present

## 2012-07-09 DIAGNOSIS — L98499 Non-pressure chronic ulcer of skin of other sites with unspecified severity: Secondary | ICD-10-CM | POA: Diagnosis not present

## 2012-07-09 DIAGNOSIS — L820 Inflamed seborrheic keratosis: Secondary | ICD-10-CM | POA: Diagnosis not present

## 2012-07-09 DIAGNOSIS — L570 Actinic keratosis: Secondary | ICD-10-CM | POA: Diagnosis not present

## 2012-07-14 DIAGNOSIS — L578 Other skin changes due to chronic exposure to nonionizing radiation: Secondary | ICD-10-CM | POA: Diagnosis not present

## 2012-07-14 DIAGNOSIS — L570 Actinic keratosis: Secondary | ICD-10-CM | POA: Diagnosis not present

## 2012-07-14 DIAGNOSIS — L98499 Non-pressure chronic ulcer of skin of other sites with unspecified severity: Secondary | ICD-10-CM | POA: Diagnosis not present

## 2012-07-14 DIAGNOSIS — R21 Rash and other nonspecific skin eruption: Secondary | ICD-10-CM | POA: Diagnosis not present

## 2012-07-29 DIAGNOSIS — L570 Actinic keratosis: Secondary | ICD-10-CM | POA: Diagnosis not present

## 2012-07-29 DIAGNOSIS — B359 Dermatophytosis, unspecified: Secondary | ICD-10-CM | POA: Diagnosis not present

## 2012-07-29 DIAGNOSIS — L98499 Non-pressure chronic ulcer of skin of other sites with unspecified severity: Secondary | ICD-10-CM | POA: Diagnosis not present

## 2012-07-29 DIAGNOSIS — R21 Rash and other nonspecific skin eruption: Secondary | ICD-10-CM | POA: Diagnosis not present

## 2012-08-04 ENCOUNTER — Ambulatory Visit: Payer: Self-pay | Admitting: Internal Medicine

## 2012-08-04 DIAGNOSIS — M25579 Pain in unspecified ankle and joints of unspecified foot: Secondary | ICD-10-CM | POA: Diagnosis not present

## 2012-08-04 DIAGNOSIS — M7730 Calcaneal spur, unspecified foot: Secondary | ICD-10-CM | POA: Diagnosis not present

## 2012-08-04 DIAGNOSIS — M79609 Pain in unspecified limb: Secondary | ICD-10-CM | POA: Diagnosis not present

## 2012-08-04 DIAGNOSIS — I739 Peripheral vascular disease, unspecified: Secondary | ICD-10-CM | POA: Diagnosis not present

## 2012-08-04 DIAGNOSIS — B354 Tinea corporis: Secondary | ICD-10-CM | POA: Diagnosis not present

## 2012-08-05 DIAGNOSIS — L570 Actinic keratosis: Secondary | ICD-10-CM | POA: Diagnosis not present

## 2012-08-05 DIAGNOSIS — R21 Rash and other nonspecific skin eruption: Secondary | ICD-10-CM | POA: Diagnosis not present

## 2012-08-05 DIAGNOSIS — L98499 Non-pressure chronic ulcer of skin of other sites with unspecified severity: Secondary | ICD-10-CM | POA: Diagnosis not present

## 2012-08-05 DIAGNOSIS — L820 Inflamed seborrheic keratosis: Secondary | ICD-10-CM | POA: Diagnosis not present

## 2012-08-12 DIAGNOSIS — L98499 Non-pressure chronic ulcer of skin of other sites with unspecified severity: Secondary | ICD-10-CM | POA: Diagnosis not present

## 2012-08-12 DIAGNOSIS — R21 Rash and other nonspecific skin eruption: Secondary | ICD-10-CM | POA: Diagnosis not present

## 2012-08-12 DIAGNOSIS — L570 Actinic keratosis: Secondary | ICD-10-CM | POA: Diagnosis not present

## 2012-08-17 ENCOUNTER — Other Ambulatory Visit: Payer: Self-pay | Admitting: Cardiovascular Disease

## 2012-08-18 ENCOUNTER — Other Ambulatory Visit: Payer: Self-pay | Admitting: *Deleted

## 2012-08-18 MED ORDER — FLECAINIDE ACETATE 50 MG PO TABS: 50.0000 mg | ORAL_TABLET | Freq: Two times a day (BID) | ORAL | Status: DC

## 2012-08-18 NOTE — Telephone Encounter (Signed)
Refilled Flecainide. 

## 2012-08-19 DIAGNOSIS — L570 Actinic keratosis: Secondary | ICD-10-CM | POA: Diagnosis not present

## 2012-08-19 DIAGNOSIS — R21 Rash and other nonspecific skin eruption: Secondary | ICD-10-CM | POA: Diagnosis not present

## 2012-08-19 DIAGNOSIS — L98499 Non-pressure chronic ulcer of skin of other sites with unspecified severity: Secondary | ICD-10-CM | POA: Diagnosis not present

## 2012-08-26 DIAGNOSIS — L98499 Non-pressure chronic ulcer of skin of other sites with unspecified severity: Secondary | ICD-10-CM | POA: Diagnosis not present

## 2012-08-26 DIAGNOSIS — R21 Rash and other nonspecific skin eruption: Secondary | ICD-10-CM | POA: Diagnosis not present

## 2012-08-26 DIAGNOSIS — L570 Actinic keratosis: Secondary | ICD-10-CM | POA: Diagnosis not present

## 2012-09-02 DIAGNOSIS — L570 Actinic keratosis: Secondary | ICD-10-CM | POA: Diagnosis not present

## 2012-09-02 DIAGNOSIS — L98499 Non-pressure chronic ulcer of skin of other sites with unspecified severity: Secondary | ICD-10-CM | POA: Diagnosis not present

## 2012-09-02 DIAGNOSIS — R21 Rash and other nonspecific skin eruption: Secondary | ICD-10-CM | POA: Diagnosis not present

## 2012-09-06 ENCOUNTER — Other Ambulatory Visit: Payer: Self-pay

## 2012-09-09 DIAGNOSIS — L578 Other skin changes due to chronic exposure to nonionizing radiation: Secondary | ICD-10-CM | POA: Diagnosis not present

## 2012-09-09 DIAGNOSIS — L570 Actinic keratosis: Secondary | ICD-10-CM | POA: Diagnosis not present

## 2012-09-09 DIAGNOSIS — L98499 Non-pressure chronic ulcer of skin of other sites with unspecified severity: Secondary | ICD-10-CM | POA: Diagnosis not present

## 2012-09-09 DIAGNOSIS — R21 Rash and other nonspecific skin eruption: Secondary | ICD-10-CM | POA: Diagnosis not present

## 2012-09-16 DIAGNOSIS — L570 Actinic keratosis: Secondary | ICD-10-CM | POA: Diagnosis not present

## 2012-09-16 DIAGNOSIS — L98499 Non-pressure chronic ulcer of skin of other sites with unspecified severity: Secondary | ICD-10-CM | POA: Diagnosis not present

## 2012-09-16 DIAGNOSIS — R21 Rash and other nonspecific skin eruption: Secondary | ICD-10-CM | POA: Diagnosis not present

## 2012-09-16 DIAGNOSIS — L578 Other skin changes due to chronic exposure to nonionizing radiation: Secondary | ICD-10-CM | POA: Diagnosis not present

## 2012-09-16 DIAGNOSIS — L821 Other seborrheic keratosis: Secondary | ICD-10-CM | POA: Diagnosis not present

## 2012-09-23 DIAGNOSIS — R21 Rash and other nonspecific skin eruption: Secondary | ICD-10-CM | POA: Diagnosis not present

## 2012-09-23 DIAGNOSIS — L570 Actinic keratosis: Secondary | ICD-10-CM | POA: Diagnosis not present

## 2012-09-23 DIAGNOSIS — L98499 Non-pressure chronic ulcer of skin of other sites with unspecified severity: Secondary | ICD-10-CM | POA: Diagnosis not present

## 2012-11-03 DIAGNOSIS — Z85828 Personal history of other malignant neoplasm of skin: Secondary | ICD-10-CM | POA: Diagnosis not present

## 2012-11-03 DIAGNOSIS — L820 Inflamed seborrheic keratosis: Secondary | ICD-10-CM | POA: Diagnosis not present

## 2012-11-03 DIAGNOSIS — L578 Other skin changes due to chronic exposure to nonionizing radiation: Secondary | ICD-10-CM | POA: Diagnosis not present

## 2012-11-03 DIAGNOSIS — D485 Neoplasm of uncertain behavior of skin: Secondary | ICD-10-CM | POA: Diagnosis not present

## 2012-11-03 DIAGNOSIS — D1800 Hemangioma unspecified site: Secondary | ICD-10-CM | POA: Diagnosis not present

## 2012-11-03 DIAGNOSIS — L821 Other seborrheic keratosis: Secondary | ICD-10-CM | POA: Diagnosis not present

## 2012-12-04 DIAGNOSIS — R51 Headache: Secondary | ICD-10-CM | POA: Diagnosis not present

## 2012-12-04 DIAGNOSIS — H109 Unspecified conjunctivitis: Secondary | ICD-10-CM | POA: Diagnosis not present

## 2012-12-04 DIAGNOSIS — J019 Acute sinusitis, unspecified: Secondary | ICD-10-CM | POA: Diagnosis not present

## 2012-12-04 DIAGNOSIS — H9209 Otalgia, unspecified ear: Secondary | ICD-10-CM | POA: Diagnosis not present

## 2012-12-10 DIAGNOSIS — H2510 Age-related nuclear cataract, unspecified eye: Secondary | ICD-10-CM | POA: Diagnosis not present

## 2012-12-10 DIAGNOSIS — L578 Other skin changes due to chronic exposure to nonionizing radiation: Secondary | ICD-10-CM | POA: Diagnosis not present

## 2012-12-10 DIAGNOSIS — D485 Neoplasm of uncertain behavior of skin: Secondary | ICD-10-CM | POA: Diagnosis not present

## 2012-12-10 DIAGNOSIS — D229 Melanocytic nevi, unspecified: Secondary | ICD-10-CM

## 2012-12-10 DIAGNOSIS — Z85828 Personal history of other malignant neoplasm of skin: Secondary | ICD-10-CM | POA: Diagnosis not present

## 2012-12-10 HISTORY — DX: Melanocytic nevi, unspecified: D22.9

## 2012-12-17 DIAGNOSIS — L578 Other skin changes due to chronic exposure to nonionizing radiation: Secondary | ICD-10-CM | POA: Diagnosis not present

## 2012-12-17 DIAGNOSIS — L821 Other seborrheic keratosis: Secondary | ICD-10-CM | POA: Diagnosis not present

## 2012-12-17 DIAGNOSIS — L570 Actinic keratosis: Secondary | ICD-10-CM | POA: Diagnosis not present

## 2012-12-17 DIAGNOSIS — Z85828 Personal history of other malignant neoplasm of skin: Secondary | ICD-10-CM | POA: Diagnosis not present

## 2012-12-17 DIAGNOSIS — D485 Neoplasm of uncertain behavior of skin: Secondary | ICD-10-CM | POA: Diagnosis not present

## 2012-12-31 ENCOUNTER — Other Ambulatory Visit (HOSPITAL_COMMUNITY): Payer: Self-pay | Admitting: Cardiology

## 2013-01-29 ENCOUNTER — Encounter: Payer: Self-pay | Admitting: Cardiovascular Disease

## 2013-01-29 ENCOUNTER — Ambulatory Visit (INDEPENDENT_AMBULATORY_CARE_PROVIDER_SITE_OTHER): Payer: Medicare Other | Admitting: Cardiovascular Disease

## 2013-01-29 VITALS — BP 110/70 | HR 60 | Ht 68.0 in | Wt 138.8 lb

## 2013-01-29 DIAGNOSIS — I4891 Unspecified atrial fibrillation: Secondary | ICD-10-CM | POA: Diagnosis not present

## 2013-01-29 DIAGNOSIS — R000 Tachycardia, unspecified: Secondary | ICD-10-CM | POA: Diagnosis not present

## 2013-01-29 MED ORDER — CLOPIDOGREL BISULFATE 75 MG PO TABS: 75.0000 mg | ORAL_TABLET | Freq: Every day | ORAL | Status: DC

## 2013-01-29 NOTE — Patient Instructions (Addendum)
You are doing well. Please start plavix one a day for blood thinner Please take flecainide twice a day Continue metoprolol 1/2 dose daily  If you stay in atrial fibrillation, restart xarelto one a day until rhythm goes back to normal rhythm  Please call the office if you continue to have episodes of atrial fibrillation  Please call us if you have new issues that need to be addressed before your next appt.  Your physician wants you to follow-up in: 6 months.  You will receive a reminder letter in the mail two months in advance. If you don't receive a letter, please call our office to schedule the follow-up appointment.

## 2013-01-29 NOTE — Assessment & Plan Note (Addendum)
We have discussed the timing of the medications. I suspect she is taking low doses of medicines before bed and does not have much rhythm control during the daytime and late afternoon . We have suggested she take flecainide twice a day, also take her low-dose metoprolol at dinner time rather than before bed. She continues to have palpitations, she may need further medication adjustment. Palpitations concerning for atrial fibrillation. She does not want full anticoagulation. After long discussion, we will start Plavix daily. She does have xarelto at home and will take this if her atrial fibrillation presents and does not break in a timely manner.

## 2013-01-29 NOTE — Progress Notes (Signed)
Patient ID: [name], female    DOB: 1946-05-02, 67 y.o.   MRN: [id number]  HPI Comments: [name] is a 67 year old woman with a history of palpitations and tachycardia, atrial fibrillation who presents for routine followup. She has not been seen in over a year. In the past, she has had Several episodes of atrial fibrillation, one requiring cardioversion. Previous smoking history, stopped in 2012  She reports that she is only taking recommended once a day at nighttime, metoprolol one half dose before bed. She has short runs of palpitations typically in the late afternoon and early evening, sometimes going to bed. She is very active at baseline. Denies any shortness of breath or chest pain. Typically when she has tachycardia with palpitations, she sits down and eventually her symptoms go away. Several episodes where she wondered if she should go to the emergency room. She's not taking anticoagulation, not taking aspirin as this had caused her side effects. Xarelto was held previously by our clinic for episode of gum bleeding.  She had decreased her metoprolol dose in half previously secondary to bradycardia .  She reports that her cholesterol is typically high. She's not particularly interested in cholesterol medication.   echocardiogram done at [facility] 01/17/2012 - Left ventricle: The cavity size was normal. Wall thickness was normal. Systolic function was normal. The estimated   ejection fraction was in the range of 55% to 60%. Wall motion was normal; there were no regional wall motion abnormalities. - Mitral valve: Mild regurgitation.  EKG shows normal sinus rhythm with rate of 60 beats per minute, no significant ST or T wave changes, incomplete right bundle branch block       Outpatient Encounter Prescriptions as of 67/04/2013  Medication Sig Dispense Refill  . ALPRAZolam (XANAX) 0.5 MG tablet Take 0.5 mg by mouth at bedtime as needed. For anxiety      . famotidine (PEPCID)  40 MG tablet Take 40 mg by mouth as needed.      . flecainide (TAMBOCOR) 50 MG tablet Take 1 tablet (50 mg total) by mouth 2 (two) times daily.she is taking this only once a day in the late evening   60 tablet  6  . metoprolol succinate (TOPROL-XL) 25 MG 24 hr tablet Take 0.5 tablets (12.5 mg total) by mouth daily. Take with or immediately following a meal.  30 tablet  6  . Simethicone (GAS-X PO) Take 1 tablet by mouth as needed. For gas       Review of Systems  Constitutional: Negative.   HENT: Negative.   Eyes: Negative.   Respiratory: Negative.   Cardiovascular: Positive for palpitations.       Tachycardia  Gastrointestinal: Negative.   Musculoskeletal: Negative.   Skin: Negative.   Neurological: Negative.   Psychiatric/Behavioral: Negative.   All other systems reviewed and are negative.    BP 110/70  Pulse 60  Ht 5\' 8"  (1.727 m)  Wt 138 lb 12 oz (62.937 kg)  BMI 21.1 kg/m2  Physical Exam  Nursing note and vitals reviewed. Constitutional: She is oriented to person, place, and time. She appears well-developed and well-nourished.  HENT:  Head: Normocephalic.  Nose: Nose normal.  Mouth/Throat: Oropharynx is clear and moist.  Eyes: Conjunctivae are normal. Pupils are equal, round, and reactive to light.  Neck: Normal range of motion. Neck supple. No JVD present.  Cardiovascular: Normal rate, regular rhythm, S1 normal, S2 normal, normal heart sounds and intact distal pulses.  Exam reveals  no gallop and no friction rub.   No murmur heard. Pulmonary/Chest: Effort normal and breath sounds normal. No respiratory distress. She has no wheezes. She has no rales. She exhibits no tenderness.  Abdominal: Soft. Bowel sounds are normal. She exhibits no distension. There is no tenderness.  Musculoskeletal: Normal range of motion. She exhibits no edema and no tenderness.  Lymphadenopathy:    She has no cervical adenopathy.  Neurological: She is alert and oriented to person, place, and  time. Coordination normal.  Skin: Skin is warm and dry. No rash noted. No erythema.  Psychiatric: She has a normal mood and affect. Her behavior is normal. Judgment and thought content normal.    Assessment and Plan

## 2013-01-29 NOTE — Assessment & Plan Note (Signed)
These episodes of palpitations and tachycardia are concerning for atrial fibrillation. We have made changes as above.

## 2013-02-24 ENCOUNTER — Emergency Department: Payer: Self-pay | Admitting: Emergency Medicine

## 2013-02-24 DIAGNOSIS — S91009A Unspecified open wound, unspecified ankle, initial encounter: Secondary | ICD-10-CM | POA: Diagnosis not present

## 2013-02-24 DIAGNOSIS — S81009A Unspecified open wound, unspecified knee, initial encounter: Secondary | ICD-10-CM | POA: Diagnosis not present

## 2013-02-24 DIAGNOSIS — I4891 Unspecified atrial fibrillation: Secondary | ICD-10-CM | POA: Diagnosis not present

## 2013-02-24 DIAGNOSIS — Z79899 Other long term (current) drug therapy: Secondary | ICD-10-CM | POA: Diagnosis not present

## 2013-02-26 DIAGNOSIS — T1490XA Injury, unspecified, initial encounter: Secondary | ICD-10-CM | POA: Diagnosis not present

## 2013-03-03 ENCOUNTER — Other Ambulatory Visit: Payer: Self-pay

## 2013-03-03 DIAGNOSIS — Z1231 Encounter for screening mammogram for malignant neoplasm of breast: Secondary | ICD-10-CM

## 2013-03-30 ENCOUNTER — Ambulatory Visit
Admission: RE | Admit: 2013-03-30 | Discharge: 2013-03-30 | Disposition: A | Discharge status: Home / Self Care | Payer: Medicare Other | Source: Ambulatory Visit

## 2013-03-30 DIAGNOSIS — Z1231 Encounter for screening mammogram for malignant neoplasm of breast: Secondary | ICD-10-CM

## 2013-04-04 DIAGNOSIS — S92309A Fracture of unspecified metatarsal bone(s), unspecified foot, initial encounter for closed fracture: Secondary | ICD-10-CM | POA: Diagnosis not present

## 2013-04-04 DIAGNOSIS — S298XXA Other specified injuries of thorax, initial encounter: Secondary | ICD-10-CM | POA: Diagnosis not present

## 2013-04-04 DIAGNOSIS — M7989 Other specified soft tissue disorders: Secondary | ICD-10-CM | POA: Diagnosis not present

## 2013-04-04 DIAGNOSIS — S91309A Unspecified open wound, unspecified foot, initial encounter: Secondary | ICD-10-CM | POA: Diagnosis not present

## 2013-04-04 DIAGNOSIS — S82899A Other fracture of unspecified lower leg, initial encounter for closed fracture: Secondary | ICD-10-CM | POA: Diagnosis not present

## 2013-04-04 DIAGNOSIS — S8990XA Unspecified injury of unspecified lower leg, initial encounter: Secondary | ICD-10-CM | POA: Diagnosis not present

## 2013-04-04 DIAGNOSIS — S81009A Unspecified open wound, unspecified knee, initial encounter: Secondary | ICD-10-CM | POA: Diagnosis not present

## 2013-04-04 DIAGNOSIS — IMO0002 Reserved for concepts with insufficient information to code with codable children: Secondary | ICD-10-CM | POA: Diagnosis not present

## 2013-04-08 DIAGNOSIS — S91309A Unspecified open wound, unspecified foot, initial encounter: Secondary | ICD-10-CM | POA: Diagnosis not present

## 2013-04-13 ENCOUNTER — Telehealth: Payer: Self-pay | Admitting: *Deleted

## 2013-04-13 DIAGNOSIS — M25579 Pain in unspecified ankle and joints of unspecified foot: Secondary | ICD-10-CM | POA: Diagnosis not present

## 2013-04-13 DIAGNOSIS — S20219A Contusion of unspecified front wall of thorax, initial encounter: Secondary | ICD-10-CM | POA: Diagnosis not present

## 2013-04-13 DIAGNOSIS — S63509A Unspecified sprain of unspecified wrist, initial encounter: Secondary | ICD-10-CM | POA: Diagnosis not present

## 2013-04-13 DIAGNOSIS — M25519 Pain in unspecified shoulder: Secondary | ICD-10-CM | POA: Diagnosis not present
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
## 2013-04-13 NOTE — Telephone Encounter (Signed)
Would consider restarting xarelto 20 mg daily If she is in and out of atrial fibrillation, she is at high risk of a stroke. If she does xarelto, okay to hold aspirin and Plavix
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
## 2013-04-14 NOTE — Telephone Encounter (Signed)
Spoke w/ pt.  States that she started a new rx yesterday for Bactrim that states "do not take if on a blood thinner". She will try her husband's enteric coated aspirin in the meantime until she hears back from [place] that she can take xarelto & bactrim together. She will be on the bactrim until at least Monday.

## 2013-04-14 NOTE — Telephone Encounter (Signed)
I have not heard of an interaction between Bactrim and xarelto. Typically the morning would be with Coumadin or warfarin  Perhaps she could talk to her pharmacist to confirm Certainly okay to take aspirin if she is concerned and then start xarelto once Bactrim has finished

## 2013-04-14 NOTE — Telephone Encounter (Signed)
I spoke with Audrie Lia, Pharm D. OK to take bactrim with xarelto. I have left a message for the patient to call.

## 2013-04-15 NOTE — Telephone Encounter (Signed)
Spoke w/ pt.  She is aware that it is okay to take the bactrim & xarelto.   She will start taking it today, as even the coated aspirin hurts her stomach.

## 2013-04-20 DIAGNOSIS — Z85828 Personal history of other malignant neoplasm of skin: Secondary | ICD-10-CM | POA: Diagnosis not present

## 2013-04-20 DIAGNOSIS — L0291 Cutaneous abscess, unspecified: Secondary | ICD-10-CM | POA: Diagnosis not present

## 2013-04-20 DIAGNOSIS — D692 Other nonthrombocytopenic purpura: Secondary | ICD-10-CM | POA: Diagnosis not present

## 2013-04-20 DIAGNOSIS — T1490XA Injury, unspecified, initial encounter: Secondary | ICD-10-CM | POA: Diagnosis not present

## 2013-04-22 DIAGNOSIS — S20219A Contusion of unspecified front wall of thorax, initial encounter: Secondary | ICD-10-CM | POA: Diagnosis not present

## 2013-04-22 DIAGNOSIS — M25579 Pain in unspecified ankle and joints of unspecified foot: Secondary | ICD-10-CM | POA: Diagnosis not present

## 2013-04-22 DIAGNOSIS — S63509A Unspecified sprain of unspecified wrist, initial encounter: Secondary | ICD-10-CM | POA: Diagnosis not present

## 2013-04-22 DIAGNOSIS — M25519 Pain in unspecified shoulder: Secondary | ICD-10-CM | POA: Diagnosis not present

## 2013-04-27 ENCOUNTER — Telehealth: Payer: Self-pay

## 2013-04-27 NOTE — Telephone Encounter (Signed)
Pt states she was in afib last night, states it up and down for a couple hours, if eventually went normal.States she did take her metoprolol.  States it was alternating to " low beats" . Please call.

## 2013-04-27 NOTE — Telephone Encounter (Signed)
Please review
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
## 2013-04-28 ENCOUNTER — Other Ambulatory Visit: Payer: Self-pay | Admitting: *Deleted

## 2013-04-28 MED ORDER — RIVAROXABAN 20 MG PO TABS: 20.0000 mg | ORAL_TABLET | Freq: Every day | ORAL | Status: DC

## 2013-04-28 NOTE — Telephone Encounter (Signed)
Refilled Xarelto sent to [facility] pharmacy.

## 2013-04-30 DIAGNOSIS — R300 Dysuria: Secondary | ICD-10-CM | POA: Diagnosis not present

## 2013-04-30 DIAGNOSIS — E2839 Other primary ovarian failure: Secondary | ICD-10-CM | POA: Diagnosis not present

## 2013-04-30 DIAGNOSIS — G478 Other sleep disorders: Secondary | ICD-10-CM | POA: Diagnosis not present

## 2013-04-30 DIAGNOSIS — I4891 Unspecified atrial fibrillation: Secondary | ICD-10-CM | POA: Diagnosis not present

## 2013-04-30 DIAGNOSIS — Z0000 Encounter for general adult medical examination without abnormal findings: Secondary | ICD-10-CM | POA: Diagnosis not present

## 2013-05-05 DIAGNOSIS — L820 Inflamed seborrheic keratosis: Secondary | ICD-10-CM | POA: Diagnosis not present

## 2013-05-05 DIAGNOSIS — Z85828 Personal history of other malignant neoplasm of skin: Secondary | ICD-10-CM | POA: Diagnosis not present

## 2013-05-05 DIAGNOSIS — L578 Other skin changes due to chronic exposure to nonionizing radiation: Secondary | ICD-10-CM | POA: Diagnosis not present

## 2013-05-05 DIAGNOSIS — L821 Other seborrheic keratosis: Secondary | ICD-10-CM | POA: Diagnosis not present

## 2013-05-05 DIAGNOSIS — D1800 Hemangioma unspecified site: Secondary | ICD-10-CM | POA: Diagnosis not present

## 2013-05-05 DIAGNOSIS — L570 Actinic keratosis: Secondary | ICD-10-CM | POA: Diagnosis not present

## 2013-05-22 DIAGNOSIS — M25579 Pain in unspecified ankle and joints of unspecified foot: Secondary | ICD-10-CM | POA: Diagnosis not present

## 2013-05-28 ENCOUNTER — Other Ambulatory Visit: Payer: Self-pay

## 2013-06-11 DIAGNOSIS — M25579 Pain in unspecified ankle and joints of unspecified foot: Secondary | ICD-10-CM | POA: Diagnosis not present

## 2013-06-22 DIAGNOSIS — M25579 Pain in unspecified ankle and joints of unspecified foot: Secondary | ICD-10-CM | POA: Diagnosis not present

## 2013-06-29 DIAGNOSIS — M25579 Pain in unspecified ankle and joints of unspecified foot: Secondary | ICD-10-CM | POA: Diagnosis not present

## 2013-07-06 DIAGNOSIS — S93439A Sprain of tibiofibular ligament of unspecified ankle, initial encounter: Secondary | ICD-10-CM | POA: Diagnosis not present

## 2013-07-06 DIAGNOSIS — M25569 Pain in unspecified knee: Secondary | ICD-10-CM | POA: Diagnosis not present

## 2013-07-13 DIAGNOSIS — M25579 Pain in unspecified ankle and joints of unspecified foot: Secondary | ICD-10-CM | POA: Diagnosis not present

## 2013-07-17 DIAGNOSIS — M25579 Pain in unspecified ankle and joints of unspecified foot: Secondary | ICD-10-CM | POA: Diagnosis not present

## 2013-07-22 DIAGNOSIS — M25579 Pain in unspecified ankle and joints of unspecified foot: Secondary | ICD-10-CM | POA: Diagnosis not present

## 2013-07-23 DIAGNOSIS — M25579 Pain in unspecified ankle and joints of unspecified foot: Secondary | ICD-10-CM | POA: Diagnosis not present

## 2013-07-27 DIAGNOSIS — M25579 Pain in unspecified ankle and joints of unspecified foot: Secondary | ICD-10-CM | POA: Diagnosis not present

## 2013-07-30 ENCOUNTER — Encounter: Payer: Self-pay | Admitting: Cardiovascular Disease

## 2013-07-30 ENCOUNTER — Ambulatory Visit (INDEPENDENT_AMBULATORY_CARE_PROVIDER_SITE_OTHER): Payer: Medicare Other | Admitting: Cardiovascular Disease

## 2013-07-30 VITALS — BP 104/68 | HR 54 | Ht 68.0 in | Wt 142.2 lb

## 2013-07-30 DIAGNOSIS — I4891 Unspecified atrial fibrillation: Secondary | ICD-10-CM | POA: Diagnosis not present

## 2013-07-30 DIAGNOSIS — K219 Gastro-esophageal reflux disease without esophagitis: Secondary | ICD-10-CM

## 2013-07-30 DIAGNOSIS — K222 Esophageal obstruction: Secondary | ICD-10-CM

## 2013-07-30 NOTE — Assessment & Plan Note (Signed)
Suggested she stay on her current medication regimen. Heart rate is low, blood pressure also low. Challenging to advance her medications.
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
## 2013-07-30 NOTE — Assessment & Plan Note (Signed)
We did suggest she take famotidine twice a day. She currently takes this once a day. She does not want omeprazole.

## 2013-07-30 NOTE — Patient Instructions (Signed)
You are doing well. No medication changes were made.  Please call us if you have new issues that need to be addressed before your next appt.  Your physician wants you to follow-up in: 6 months.  You will receive a reminder letter in the mail two months in advance. If you don't receive a letter, please call our office to schedule the follow-up appointment.   

## 2013-08-17 DIAGNOSIS — M25579 Pain in unspecified ankle and joints of unspecified foot: Secondary | ICD-10-CM | POA: Diagnosis not present

## 2013-08-23 DIAGNOSIS — M25579 Pain in unspecified ankle and joints of unspecified foot: Secondary | ICD-10-CM | POA: Diagnosis not present

## 2013-08-27 DIAGNOSIS — L821 Other seborrheic keratosis: Secondary | ICD-10-CM | POA: Diagnosis not present

## 2013-08-27 DIAGNOSIS — L570 Actinic keratosis: Secondary | ICD-10-CM | POA: Diagnosis not present

## 2013-08-27 DIAGNOSIS — Z85828 Personal history of other malignant neoplasm of skin: Secondary | ICD-10-CM | POA: Diagnosis not present

## 2013-08-27 DIAGNOSIS — D239 Other benign neoplasm of skin, unspecified: Secondary | ICD-10-CM | POA: Diagnosis not present

## 2013-08-27 DIAGNOSIS — L820 Inflamed seborrheic keratosis: Secondary | ICD-10-CM | POA: Diagnosis not present

## 2013-08-27 DIAGNOSIS — L578 Other skin changes due to chronic exposure to nonionizing radiation: Secondary | ICD-10-CM | POA: Diagnosis not present

## 2013-08-30 ENCOUNTER — Other Ambulatory Visit: Payer: Self-pay | Admitting: Cardiovascular Disease

## 2013-09-21 DIAGNOSIS — M25579 Pain in unspecified ankle and joints of unspecified foot: Secondary | ICD-10-CM | POA: Diagnosis not present

## 2013-09-29 ENCOUNTER — Other Ambulatory Visit: Payer: Self-pay | Admitting: Cardiovascular Disease

## 2013-09-30 ENCOUNTER — Other Ambulatory Visit: Payer: Self-pay | Admitting: *Deleted

## 2013-09-30 ENCOUNTER — Other Ambulatory Visit: Payer: Self-pay

## 2013-09-30 MED ORDER — FLECAINIDE ACETATE 50 MG PO TABS: ORAL_TABLET | ORAL | Status: DC

## 2013-09-30 NOTE — Telephone Encounter (Signed)
Requested Prescriptions   Signed Prescriptions Disp Refills  . flecainide (TAMBOCOR) 50 MG tablet 180 tablet 3    Sig: TAKE ONE TABLET BY MOUTH TWICE DAILY    Authorizing Provider: Minna Merritts    Ordering User: Britt Bottom

## 2013-10-13 ENCOUNTER — Telehealth: Payer: Self-pay | Admitting: *Deleted

## 2013-10-13 NOTE — Telephone Encounter (Signed)
Patient comes and goes with afib. Not sure what to do please advise please call patient.

## 2013-10-13 NOTE — Telephone Encounter (Signed)
Spoke w/ pt.  She reports that she and her husband are currently at [facility].  She had a busy day yesterday. After resting, she felt fluttering in her chest and took an extra flecainide.   Reports that the sensation stopped around 11:30pm.  [place].  Reports that she is normally very active.  She walked in the parking deck to the [place] yesterday, but this was not an extra activity for her. States that last weekend, she and her husband were cutting trees and she was dragging limbs thru the yard w/ no difficulty. She states that she is going to the beach next week and would like to be evaluated before then, as she does not want to have to go the ED at the beach.  She is concerned and would like to know at what point she is to go to the ED. Pt sched to see Dr. Rockey Situ tomorrow at 9:15.

## 2013-10-14 ENCOUNTER — Ambulatory Visit (INDEPENDENT_AMBULATORY_CARE_PROVIDER_SITE_OTHER): Payer: Medicare Other | Admitting: Cardiovascular Disease

## 2013-10-14 ENCOUNTER — Encounter: Payer: Self-pay | Admitting: Cardiovascular Disease

## 2013-10-14 VITALS — BP 118/64 | HR 55 | Ht 68.0 in | Wt 144.2 lb

## 2013-10-14 DIAGNOSIS — F419 Anxiety disorder, unspecified: Secondary | ICD-10-CM

## 2013-10-14 DIAGNOSIS — F411 Generalized anxiety disorder: Secondary | ICD-10-CM

## 2013-10-14 DIAGNOSIS — I4891 Unspecified atrial fibrillation: Secondary | ICD-10-CM

## 2013-10-14 DIAGNOSIS — R000 Tachycardia, unspecified: Secondary | ICD-10-CM

## 2013-10-14 MED ORDER — PROPRANOLOL HCL 20 MG PO TABS: 20.0000 mg | ORAL_TABLET | Freq: Three times a day (TID) | ORAL | Status: DC | PRN

## 2013-10-14 MED ORDER — DILTIAZEM HCL 30 MG PO TABS: 30.0000 mg | ORAL_TABLET | Freq: Three times a day (TID) | ORAL | Status: DC | PRN

## 2013-10-14 NOTE — Patient Instructions (Addendum)
You are doing well.  For atrial fibrillation, increase the flecainide at night to 100 mg,  Take 50 mg in the AM  For breakthrough afib, take an extra flecainide 50mg  and one diltiazem 30mg  If after 2 hours, you are not back in normal rhythm, take a propranolol 20 mg  Please call the office if you experience more episodes of breakthrough afib  Please call us if you have new issues that need to be addressed before your next appt.  Your physician wants you to follow-up in: 6 months.  You will receive a reminder letter in the mail two months in advance. If you don't receive a letter, please call our office to schedule the follow-up appointment.

## 2013-10-14 NOTE — Assessment & Plan Note (Signed)
She has episodes of anxiety in the setting of atrial fibrillation. Suggested she take her Xanax as needed

## 2013-10-14 NOTE — Assessment & Plan Note (Addendum)
We had a long discussion about her recent episodes of atrial fibrillation in the various treatment options. She has had Several recent episodes of atrial fibrillation typically in the evening. We have suggested she take flecainide 100 mg in the evening, 50 mg in the morning. Continue on her low-dose metoprolol. If she continues to have breakthrough episodes, we would increase flecainide to 100 mg twice a day. Also given her propranolol 20 mg to take as needed, as well as diltiazem 30 mg to take as needed for breakthrough atrial fibrillation

## 2013-10-14 NOTE — Assessment & Plan Note (Signed)
Episodes of tachycardia concerning for breakthrough atrial fibrillation

## 2013-10-14 NOTE — Progress Notes (Signed)
Patient ID: [name], female    DOB: [birth year], 68 y.o.   MRN: [id number]  HPI Comments: Mrs Friedhoff is a 68 year old woman with a history of palpitations and tachycardia, atrial fibrillation who presents for routine followup.  In the past, she has had Several episodes of atrial fibrillation, one requiring cardioversion. Previous smoking history, stopped in 2012  In followup today, she continues to take flecainide 50 mg twice a day with low-dose metoprolol succinate in the evening . She reports having several recent episodes of breakthrough atrial fibrillation. She usually waits for them to go away. On one occasion took extra dose of flecainide. Symptoms resolved after one hour. Most of her atrial fibrillation episodes last for less than several hours. She is relatively asymptomatic, gets very anxious when she has these episodes She is tolerating anticoagulation No further GERD symptoms. She takes Pepcid daily.   She reports that her cholesterol is typically high. She's not particularly interested in cholesterol medication.   echocardiogram done at [facility] 01/17/2012 - Left ventricle: The cavity size was normal. Wall thickness was normal. Systolic function was normal. The estimated   ejection fraction was in the range of 55% to 60%. Wall motion was normal; there were no regional wall motion abnormalities. - Mitral valve: Mild regurgitation.  EKG shows normal sinus rhythm with rate of 55 beats per minute, no significant ST or T wave changes       Outpatient Encounter Prescriptions as of 68/25/2015  Medication Sig  . ALPRAZolam (XANAX) 0.5 MG tablet Take 0.5 mg by mouth at bedtime. For anxiety  . famotidine (PEPCID) 40 MG tablet Take 40 mg by mouth as needed.  . flecainide (TAMBOCOR) 50 MG tablet TAKE ONE TABLET BY MOUTH TWICE DAILY  . metoprolol succinate (TOPROL-XL) 25 MG 24 hr tablet Take 0.5 tablets (12.5 mg total) by mouth daily. Take with or immediately following a meal.   . Simethicone (GAS-X PO) Take 1 tablet by mouth as needed. For gas  . XARELTO 20 MG TABS tablet TAKE ONE TABLET BY MOUTH EVERY DAY    Review of Systems  Constitutional: Negative.   HENT: Negative.   Eyes: Negative.   Respiratory: Negative.   Cardiovascular: Positive for palpitations.       Tachycardia  Gastrointestinal: Negative.   Endocrine: Negative.   Musculoskeletal: Negative.   Skin: Negative.   Allergic/Immunologic: Negative.   Neurological: Negative.   Hematological: Negative.   Psychiatric/Behavioral: Negative.   All other systems reviewed and are negative.  and  BP 118/64  Pulse 55  Ht 5\' 8"  (1.727 m)  Wt 144 lb 4 oz (65.431 kg)  BMI 21.94 kg/m2  Physical Exam  Nursing note and vitals reviewed. Constitutional: She is oriented to person, place, and time. She appears well-developed and well-nourished.  HENT:  Head: Normocephalic.  Nose: Nose normal.  Mouth/Throat: Oropharynx is clear and moist.  Eyes: Conjunctivae are normal. Pupils are equal, round, and reactive to light.  Neck: Normal range of motion. Neck supple. No JVD present.  Cardiovascular: Normal rate, regular rhythm, S1 normal, S2 normal, normal heart sounds and intact distal pulses.  Exam reveals no gallop and no friction rub.   No murmur heard. Pulmonary/Chest: Effort normal and breath sounds normal. No respiratory distress. She has no wheezes. She has no rales. She exhibits no tenderness.  Abdominal: Soft. Bowel sounds are normal. She exhibits no distension. There is no tenderness.  Musculoskeletal: Normal range of motion. She exhibits no edema and no  tenderness.  Lymphadenopathy:    She has no cervical adenopathy.  Neurological: She is alert and oriented to person, place, and time. Coordination normal.  Skin: Skin is warm and dry. No rash noted. No erythema.  Psychiatric: She has a normal mood and affect. Her behavior is normal. Judgment and thought content normal.    Assessment and Plan

## 2013-10-27 DIAGNOSIS — I498 Other specified cardiac arrhythmias: Secondary | ICD-10-CM | POA: Diagnosis not present

## 2013-10-27 DIAGNOSIS — G478 Other sleep disorders: Secondary | ICD-10-CM | POA: Diagnosis not present

## 2013-10-27 DIAGNOSIS — M25579 Pain in unspecified ankle and joints of unspecified foot: Secondary | ICD-10-CM | POA: Diagnosis not present

## 2013-11-02 ENCOUNTER — Other Ambulatory Visit: Payer: Self-pay

## 2013-11-02 MED ORDER — METOPROLOL SUCCINATE ER 25 MG PO TB24: 12.5000 mg | ORAL_TABLET | Freq: Every day | ORAL | Status: DC

## 2013-11-02 MED ORDER — RIVAROXABAN 20 MG PO TABS: ORAL_TABLET | ORAL | Status: DC

## 2013-11-02 NOTE — Telephone Encounter (Signed)
Requested Prescriptions   Signed Prescriptions Disp Refills  . metoprolol succinate (TOPROL-XL) 25 MG 24 hr tablet 30 tablet 6    Sig: Take 0.5 tablets (12.5 mg total) by mouth daily. Take with or immediately following a meal.    Authorizing Provider: Minna Merritts    Ordering User: Eugenio Hoes,  C  . rivaroxaban (XARELTO) 20 MG TABS tablet 30 tablet 3    Sig: TAKE ONE TABLET BY MOUTH EVERY DAY    Authorizing Provider: Minna Merritts    Ordering User: Britt Bottom

## 2013-11-02 NOTE — Telephone Encounter (Signed)
Pt is completely out and is going out of town for 10 days.

## 2013-11-03 ENCOUNTER — Other Ambulatory Visit: Payer: Self-pay | Admitting: Cardiovascular Disease

## 2013-11-17 ENCOUNTER — Telehealth: Payer: Self-pay

## 2013-11-17 NOTE — Telephone Encounter (Signed)
Pt called states she has always had low BP, states last Wed she had a headache and her BP was 139/71, last few days was 152/59 142/74,  This a.m. It was normal - 117/60. Pt is concerned "why it is this high". Please call.

## 2013-11-17 NOTE — Telephone Encounter (Signed)
Spoke w/ pt.  She is very anxious and worried about her BP, as her numbers have always been so low that "people have made me lay down b/c they're worried I'm gonna pass out". Reports that she has been quite busy over the past week, she was at the beach accepting an award and has not had a chance to sit down yet. States that she had a headache, so she checked her BP every 30 mins and was alarmed, as numbers are "too high for me". On discussion, pt admits that she was so busy that she did not drink much water and was quite anxious over the weekend.  Advised pt to keep a log of her BPs BID for the rest of the week and call me on Fri w/ readings. In the meantime, not to worry about her BP, stay hydrated, and take her xanax as prescribed.  Pt is appreciative and will call on Fri w/ readings.

## 2013-12-28 DIAGNOSIS — L570 Actinic keratosis: Secondary | ICD-10-CM | POA: Diagnosis not present

## 2013-12-28 DIAGNOSIS — L820 Inflamed seborrheic keratosis: Secondary | ICD-10-CM | POA: Diagnosis not present

## 2013-12-28 DIAGNOSIS — B079 Viral wart, unspecified: Secondary | ICD-10-CM | POA: Diagnosis not present

## 2013-12-28 DIAGNOSIS — Z85828 Personal history of other malignant neoplasm of skin: Secondary | ICD-10-CM | POA: Diagnosis not present

## 2014-02-04 ENCOUNTER — Encounter: Payer: Self-pay | Admitting: Gastroenterology

## 2014-02-22 ENCOUNTER — Encounter: Payer: Self-pay | Admitting: Internal Medicine

## 2014-02-22 ENCOUNTER — Ambulatory Visit (INDEPENDENT_AMBULATORY_CARE_PROVIDER_SITE_OTHER): Payer: Medicare Other | Admitting: Internal Medicine

## 2014-02-22 ENCOUNTER — Telehealth: Payer: Self-pay

## 2014-02-22 VITALS — BP 100/58 | HR 60 | Ht 68.0 in | Wt 139.8 lb

## 2014-02-22 DIAGNOSIS — R1310 Dysphagia, unspecified: Secondary | ICD-10-CM

## 2014-02-22 DIAGNOSIS — R198 Other specified symptoms and signs involving the digestive system and abdomen: Secondary | ICD-10-CM | POA: Diagnosis not present

## 2014-02-22 DIAGNOSIS — Z1239 Encounter for other screening for malignant neoplasm of breast: Secondary | ICD-10-CM | POA: Insufficient documentation

## 2014-02-22 DIAGNOSIS — Z7901 Long term (current) use of anticoagulants: Secondary | ICD-10-CM

## 2014-02-22 DIAGNOSIS — K52831 Collagenous colitis: Secondary | ICD-10-CM

## 2014-02-22 DIAGNOSIS — K5289 Other specified noninfective gastroenteritis and colitis: Secondary | ICD-10-CM | POA: Diagnosis not present

## 2014-02-22 DIAGNOSIS — R194 Change in bowel habit: Secondary | ICD-10-CM

## 2014-02-22 NOTE — Telephone Encounter (Signed)
We would stop xarelto 2 days prior to the procedure Restart anticoagulation at least 48 hours after the procedure if polyps are resected Could start the anticoagulation the next day after the procedure if no resection of polyps were no biopsies

## 2014-02-22 NOTE — Progress Notes (Signed)
Subjective:    Patient ID: [name], female    DOB: 21-Jun-1946, 68 y.o.   MRN: [id number]  HPI The patient is a delightful lady that I have seen in the past, she is followed for many years with my retired partner. She has IBS, recurrent dysphagia without other objective findings but does have long-lasting response to [facility] dilation. She is having more intermittent chest pains and dysphagia problems and would like another endoscopy and dilation. She is also having changing bowel habits to having more urgent defecation such to the point where she will not leave the house so much. She will not move her bowels for several days at a time as well. She has a history of collagenous colitis and thinks it might be like that somewhat. Her last colonoscopy was in 2010, her last upper endoscopy was in 2012 these are reviewed.  Allergies  Allergen Reactions  . Latex   . Adhesive [Tape] Rash  . Aspirin Other (See Comments)    Aggravates Acid Reflux   Outpatient Prescriptions Prior to Visit  Medication Sig Dispense Refill  . ALPRAZolam (XANAX) 0.5 MG tablet Take 0.5 mg by mouth at bedtime. For anxiety      . diltiazem (CARDIZEM) 30 MG tablet Take 1 tablet (30 mg total) by mouth 3 (three) times daily as needed (for breakthrough atrial fibrillation).  90 tablet  4  . flecainide (TAMBOCOR) 50 MG tablet TAKE ONE TABLET BY MOUTH TWICE DAILY  180 tablet  3  . metoprolol succinate (TOPROL-XL) 25 MG 24 hr tablet TAKE 1 TABLET BY MOUTH DAILY  WITH OR IMMEDIATELY FOLLOWING A MEAL.  30 tablet  3  . propranolol (INDERAL) 20 MG tablet Take 1 tablet (20 mg total) by mouth 3 (three) times daily as needed (for breakthrough atrial fibrillation).  90 tablet  4  . rivaroxaban (XARELTO) 20 MG TABS tablet TAKE ONE TABLET BY MOUTH EVERY DAY  30 tablet  3  . Simethicone (GAS-X PO) Take 1 tablet by mouth as needed. For gas      . famotidine (PEPCID) 40 MG tablet Take 40 mg by mouth as needed.       No  facility-administered medications prior to visit.   Past Medical History  Diagnosis Date  . Mitral valve disorders     a. 12/2011 Echo: EF 55-60%, NRWMA, No evidence of MVP, Mild MR  . Collagenous colitis   . Osteoporosis   . GERD (gastroesophageal reflux disease)   . IBS (irritable bowel syndrome)   . Internal hemorrhoids 2010    Colon-Dr. Vira Clark   . Schatzki's ring 2010     EGD- Dr. Vira Clark   . Hiatal hernia 2004  . Cataract   . Hyperlipidemia   . Midsternal chest pain     a. 01/2011 Ex Mv: EF 68%, No ischemia.  . Squamous cell carcinoma of skin     multiple  . Heart murmur   . Atrial fibrillation     a. diagnosed 12/2011  . Migraine 01/16/12    "none now for several years"  . Arthritis     "fingers; right shoulder"  . Kidney stones   . Broken ribs    Past Surgical History  Procedure Laterality Date  . Upper gastrointestinal endoscopy  Multiple    w/dilation, hiatal hernia  . Colonoscopy  Multiple  . Vaginal hysterectomy  1972  . Tonsillectomy and adenoidectomy  1975  . Appendectomy  1963  . Ovarian cyst removal  1990's  .  Shoulder arthroscopy w/ rotator cuff repair Right ~ 2005  . Breast cyst aspiration Bilateral     "several; both sides"  . Squamous cell carcinoma excision      "10-12 removed; all over my body - nose, predominately legs"  . Cystoscopy w/ stone manipulation  1980's  . Cardioversion  01/16/2012    Procedure: CARDIOVERSION;  Surgeon: Heather Lance, MD;  Location: [facility] OR;  Service: Cardiovascular;  Laterality: N/A;  . Leg surgery      skin cancer removal   History   Social History  . Marital Status: Married    Spouse Name: N/A    Number of Children: 1       Social History Main Topics  . Smoking status: Former Smoker -- 0.10 packs/day for 40 years    Types: Cigarettes    Quit date: 01/16/2012  . Smokeless tobacco: Never Used  . Alcohol Use: No     Comment: rare  . Drug Use: No   Social History Narrative   Lives in [place] with husband.    She is retired   One daughter, 2 grandsons   Spends a fair amount of leisure time at [place] History  Problem Relation Age of Onset  . Colon cancer Neg Hx   . Heart disease Father     A Fib and CHF - died in Mid 68  . Diabetes Father   . Kidney disease Father   . Irritable bowel syndrome Father   . Alzheimer's disease Mother     Died in [place] 73's.       Review of Systems As per history of present illness, she is on Xarelto for atrial fibrillation followed by Dr. Rockey Clark    Objective:   Physical Exam General:  NAD Eyes:   anicteric Lungs:  clear Heart:  S1S2 no rubs, murmurs or gallops Abdomen:  soft and nontender, BS+ Ext:   no edema    Data Reviewed:  Prior upper endoscopy colonoscopy and GI notes    Assessment & Plan:   1. Dysphagia, unspecified(787.20)   Long history of this without obvious stricture, at least on recent exam, she gets durable remission this for a number of years after [facility] dilation so we'll repeat that most likely. She has had some squamous cell carcinoma on her legs and is somewhat concerned about the development of that in the esophagus as well. Should be able reassure her about that. Consider manometry in the future, remote barium swallow K.   2. Change in bowel habits   3. Collagenous colitis   She may have collagenous colitis flaring, could be just her irritable bowel syndrome, I think is reasonable to repeat a colonoscopy at this point given her overall history and the nature of her symptoms of urgent diarrheal stools and concern over incontinence. Further plans pending that test.   4. Long term (current) use of anticoagulants - Xarelto for A fib   I would think she'll be able to hold this but we will confer with Dr. Rockey Clark     The risks and benefits as well as alternatives of endoscopic procedure(s) have been discussed and reviewed. All questions answered. The patient agrees to proceed.  I appreciate the opportunity to care for  this patient. CC: Heather Guise, MD

## 2014-02-22 NOTE — Telephone Encounter (Signed)
[facility] [address] [place] [address]  02/22/2014   RE: [name] DOB: Jan 21, 1946 MRN: [id number]   Dear Luci Bank, M.D.,    We have scheduled the above patient for an endoscopic procedure. Our records show that she is on anticoagulation therapy.   Please advise as to how long the patient may come off her therapy of xarelto prior to the EGD/Dil, colonoscopy procedure, which is scheduled for 03/30/14.  Please fax back/ or route the completed form to  [place], [employer] at [phone].   Sincerely,    [name], M.D.

## 2014-02-22 NOTE — Patient Instructions (Signed)
You have been scheduled for an endoscopy and colonoscopy. Please follow the written instructions given to you at your visit today. Please pick up your prep supplies at the pharmacy.. If you use inhalers (even only as needed), please bring them with you on the day of your procedure. Your physician has requested that you go to www.startemmi.com and enter the access code given to you at your visit today. This web site gives a general overview about your procedure. However, you should still follow specific instructions given to you by our office regarding your preparation for the procedure.   You will be contaced by our office prior to your procedure for directions on holding your xarelto..  If you do not hear from our office 1 week prior to your scheduled procedure, please call [phone] to discuss.  I appreciate the opportunity to care for you.

## 2014-02-22 NOTE — Telephone Encounter (Signed)
LM to call me back.

## 2014-02-23 NOTE — Telephone Encounter (Signed)
Spoke with patient and informed her to hold the xarelto 2 days prior to the procedure per Dr. Rockey Situ.  She verbalized understanding.

## 2014-03-01 ENCOUNTER — Other Ambulatory Visit: Payer: Self-pay

## 2014-03-01 DIAGNOSIS — L570 Actinic keratosis: Secondary | ICD-10-CM | POA: Diagnosis not present

## 2014-03-01 DIAGNOSIS — Z1231 Encounter for screening mammogram for malignant neoplasm of breast: Secondary | ICD-10-CM

## 2014-03-07 ENCOUNTER — Other Ambulatory Visit: Payer: Self-pay | Admitting: Cardiovascular Disease

## 2014-03-23 DIAGNOSIS — M810 Age-related osteoporosis without current pathological fracture: Secondary | ICD-10-CM | POA: Diagnosis not present

## 2014-03-25 ENCOUNTER — Telehealth: Payer: Self-pay | Admitting: *Deleted

## 2014-03-25 NOTE — Telephone Encounter (Signed)
Spoke w/ pt.  Advised her of Chris's recommendation. She states that she was previously advised to hold xarelto, but she could not remember for how long.  Asked pt to call back w/ any other questions or concerns.

## 2014-03-25 NOTE — Telephone Encounter (Signed)
Patient called and needs to know when to stop the Xarelto for her colonoscopy on Tue 03/30/14. Please call

## 2014-03-25 NOTE — Telephone Encounter (Signed)
Ultimately up to her GI doctors recommendations but many like it discontinued 3 days in advance.

## 2014-03-30 ENCOUNTER — Telehealth: Payer: Self-pay | Admitting: *Deleted

## 2014-03-30 ENCOUNTER — Ambulatory Visit (AMBULATORY_SURGERY_CENTER): Payer: Medicare Other | Admitting: Internal Medicine

## 2014-03-30 ENCOUNTER — Encounter: Payer: Self-pay | Admitting: Internal Medicine

## 2014-03-30 VITALS — BP 133/74 | HR 58 | Temp 97.3°F | Resp 17 | Ht 68.0 in | Wt 139.0 lb

## 2014-03-30 DIAGNOSIS — B3781 Candidal esophagitis: Secondary | ICD-10-CM | POA: Diagnosis not present

## 2014-03-30 DIAGNOSIS — R198 Other specified symptoms and signs involving the digestive system and abdomen: Secondary | ICD-10-CM

## 2014-03-30 DIAGNOSIS — K52831 Collagenous colitis: Secondary | ICD-10-CM

## 2014-03-30 DIAGNOSIS — I4891 Unspecified atrial fibrillation: Secondary | ICD-10-CM | POA: Diagnosis not present

## 2014-03-30 DIAGNOSIS — K222 Esophageal obstruction: Secondary | ICD-10-CM

## 2014-03-30 DIAGNOSIS — K5289 Other specified noninfective gastroenteritis and colitis: Secondary | ICD-10-CM | POA: Diagnosis not present

## 2014-03-30 DIAGNOSIS — R197 Diarrhea, unspecified: Secondary | ICD-10-CM | POA: Diagnosis not present

## 2014-03-30 DIAGNOSIS — R1310 Dysphagia, unspecified: Secondary | ICD-10-CM | POA: Diagnosis not present

## 2014-03-30 MED ORDER — FLUCONAZOLE 100 MG PO TABS: 100.0000 mg | ORAL_TABLET | Freq: Every day | ORAL | Status: AC

## 2014-03-30 MED ORDER — SODIUM CHLORIDE 0.9 % IV SOLN: 500.0000 mL | INTRAVENOUS | Status: DC

## 2014-03-30 NOTE — Telephone Encounter (Signed)
Patient is having a colonoscopy today at 2:00. When is she to start her Xarelto? Please lmom

## 2014-03-30 NOTE — Op Note (Signed)
[facility]  [address] [place], [address]   ENDOSCOPY PROCEDURE REPORT  PATIENT: [name], [name]  MR#: [id number] BIRTHDATE: 27-Mar-1946 , 68  yrs. old GENDER: Female ENDOSCOPIST: Gatha Mayer, MD, [facility] PROCEDURE DATE:  03/30/2014 PROCEDURE:  EGD, diagnostic and Maloney dilation of esophagus ASA CLASS:     Class II INDICATIONS:  Dysphagia. MEDICATIONS: Propofol (Diprivan) 170 mg IV, MAC sedation, administered by CRNA, and These medications were titrated to patient response per physician's verbal order TOPICAL ANESTHETIC: none  DESCRIPTION OF PROCEDURE: After the risks benefits and alternatives of the procedure were thoroughly explained, informed consent was obtained.  The LB [id number] endoscope was introduced through the mouth and advanced to the second portion of the duodenum. Without limitations.  The instrument was slowly withdrawn as the mucosa was fully examined.        ESOPHAGUS: White exudates consistent with candidiasis were found in the entire esophagus.   A stenosis was found at the gastroesophageal junction.  The stenosis was traversable with the endoscope.  The remainder of the upper endoscopy exam was otherwise normal. Retroflexed views revealed no abnormalities.     The scope was then withdrawn from the patient, a 65 Fr Maloney passed with mild resistance and no heme,  and the procedure completed.  COMPLICATIONS: There were no complications. ENDOSCOPIC IMPRESSION: 1.   White exudates consistent with candidiasis in esophagus 2.   Stenosis was found at the gastroesophageal junction - dilated 54 Fr Maloney 3.   The remainder of the upper endoscopy exam was otherwise normal  RECOMMENDATIONS: 1.  Clear liquids until 430 PM , then soft foods rest of day. Resume prior diet tomorrow. 2.  Fluconazole x 14 days 3.  Colonoscopy next    eSigned:  Gatha Mayer, MD, [facility] 03/30/2014 3:43 PM   CC:The Patient  and Clayborn Bigness,  MD

## 2014-03-30 NOTE — Telephone Encounter (Signed)
Left message for pt that, per Ignacia Bayley, NP, we would recommend that she restart her Xarelto today, but this decision is ultimately up to her GI doc.  Asked her to call w/ any questions or concerns.

## 2014-03-30 NOTE — Progress Notes (Signed)
No problems noted in the recovery room. maw 

## 2014-03-30 NOTE — Progress Notes (Signed)
Procedure ends, to recovery, report given and VSS. 

## 2014-03-30 NOTE — Op Note (Signed)
[place]  [address]. [place], [address]   COLONOSCOPY PROCEDURE REPORT  PATIENT: [name], [name]  MR#: [id number] BIRTHDATE: April 29, 1946 , 68  yrs. old GENDER: Female ENDOSCOPIST: Gatha Mayer, MD, [facility] PROCEDURE DATE:  03/30/2014 PROCEDURE:   Colonoscopy with biopsy First Screening Colonoscopy - Avg.  risk and is 50 yrs.  old or older - No.  Prior Negative Screening - Now for repeat screening. N/A  History of Adenoma - Now for follow-up colonoscopy & has been > or = to 3 yrs.  N/A  Polyps Removed Today? No.  Recommend repeat exam, <10 yrs? No. ASA CLASS:   Class II INDICATIONS:Change in bowel habits and unexplained diarrhea - ? recurrent collagenous colitis MEDICATIONS: Propofol (Diprivan) 130 mg IV, MAC sedation, administered by CRNA, and These medications were titrated to patient response per physician's verbal order  DESCRIPTION OF PROCEDURE:   After the risks benefits and alternatives of the procedure were thoroughly explained, informed consent was obtained.  A digital rectal exam revealed no abnormalities of the rectum.   The LB [id number]  endoscope was introduced through the anus and advanced to the cecum, which was identified by both the appendix and ileocecal valve. No adverse events experienced.   The quality of the prep was excellent, using MiraLax  The instrument was then slowly withdrawn as the colon was fully examined.      COLON FINDINGS: The colonic mucosa appeared normal throughout the entire examined colon.  Multiple random biopsies of the area were performed.   Mild diverticulosis was noted in the sigmoid colon. Retroflexed views revealed no abnormalities. The time to cecum=4 minutes 35 seconds.  Withdrawal time=6 minutes 0 seconds.  The scope was withdrawn and the procedure completed. COMPLICATIONS: There were no complications.  ENDOSCOPIC IMPRESSION: 1.   The colonic mucosa appeared normal throughout the  entire examined colon; multiple random biopsies of the area were performed  2.   Mild diverticulosis was noted in the sigmoid colon  RECOMMENDATIONS: 1.  Await biopsy results 2.  Office will call with the results. 3.   restart Xarelto tomorrow   eSigned:  Gatha Mayer, MD, [facility] 03/30/2014 3:47 PM   cc: The Patient and Clayborn Bigness, MD

## 2014-03-30 NOTE — Patient Instructions (Addendum)
I dilated the esophagus because it was narrowed where the esophagus and stomach meet. (Stenosis) You also have an infection in the esophagus - Candida (thrush) - which is a yeast. I will treat you with a medication called fluconazole.  The colonoscopy was ok - some diverticulosis - I took biopsies to check on the colitis. We will call results and plans.  Restart Xarelto tomorrow.  I appreciate the opportunity to care for you. Gatha Mayer, MD, FACG    YOU HAD AN ENDOSCOPIC PROCEDURE TODAY AT [facility]: Refer to the procedure report that was given to you for any specific questions about what was found during the examination.  If the procedure report does not answer your questions, please call your gastroenterologist to clarify.  If you requested that your care partner not be given the details of your procedure findings, then the procedure report has been included in a sealed envelope for you to review at your convenience later.  YOU SHOULD EXPECT: Some feelings of bloating in the abdomen. Passage of more gas than usual.  Walking can help get rid of the air that was put into your GI tract during the procedure and reduce the bloating. If you had a lower endoscopy (such as a colonoscopy or flexible sigmoidoscopy) you may notice spotting of blood in your stool or on the toilet paper. If you underwent a bowel prep for your procedure, then you may not have a normal bowel movement for a few days.  DIET: Drink plenty of fluids but you should avoid alcoholic beverages for 24 hours.  Please follow the dilatation diet the rest of the day.  Handout given.  ACTIVITY: Your care partner should take you home directly after the procedure.  You should plan to take it easy, moving slowly for the rest of the day.  You can resume normal activity the day after the procedure however you should NOT DRIVE or use heavy machinery for 24 hours (because of the sedation medicines used during the test).     SYMPTOMS TO REPORT IMMEDIATELY: A gastroenterologist can be reached at any hour.  During normal business hours, 8:30 AM to 5:00 PM Monday through Friday, call [phone].  After hours and on weekends, please call the GI answering service at [phone] who will take a message and have the physician on call contact you.   Following lower endoscopy (colonoscopy or flexible sigmoidoscopy):  Excessive amounts of blood in the stool  Significant tenderness or worsening of abdominal pains  Swelling of the abdomen that is new, acute  Fever of 100F or higher  Following upper endoscopy (EGD)  Vomiting of blood or coffee ground material  New chest pain or pain under the shoulder blades  Painful or persistently difficult swallowing  New shortness of breath  Fever of 100F or higher  Black, tarry-looking stools  FOLLOW UP: If any biopsies were taken you will be contacted by phone or by letter within the next 1-3 weeks.  Call your gastroenterologist if you have not heard about the biopsies in 3 weeks.  Our staff will call the home number listed on your records the next business day following your procedure to check on you and address any questions or concerns that you may have at that time regarding the information given to you following your procedure. This is a courtesy call and so if there is no answer at the home number and we have not heard from you through the emergency physician  on call, we will assume that you have returned to your regular daily activities without incident.  SIGNATURES/CONFIDENTIALITY: You and/or your care partner have signed paperwork which will be entered into your electronic medical record.  These signatures attest to the fact that that the information above on your After Visit Summary has been reviewed and is understood.  Full responsibility of the confidentiality of this discharge information lies with you and/or your care-partner.     Handouts were given to  your care partner on diverticulosis and a dilatation diet to follow the rest of the day. You may resume your current medications today.  Resume your Xarelto tomorrow.  A new rx was sent to the pharmacy for the yeast.  Await biopsy results. Please call if any questions or concerns.

## 2014-03-30 NOTE — Progress Notes (Signed)
Called to room to assist during endoscopic procedure.  Patient ID and intended procedure confirmed with present staff. Received instructions for my participation in the procedure from the performing physician.  

## 2014-03-31 ENCOUNTER — Telehealth: Payer: Self-pay | Admitting: *Deleted

## 2014-03-31 DIAGNOSIS — C4492 Squamous cell carcinoma of skin, unspecified: Secondary | ICD-10-CM

## 2014-03-31 DIAGNOSIS — D485 Neoplasm of uncertain behavior of skin: Secondary | ICD-10-CM | POA: Diagnosis not present

## 2014-03-31 DIAGNOSIS — C44721 Squamous cell carcinoma of skin of unspecified lower limb, including hip: Secondary | ICD-10-CM | POA: Diagnosis not present

## 2014-03-31 DIAGNOSIS — L578 Other skin changes due to chronic exposure to nonionizing radiation: Secondary | ICD-10-CM | POA: Diagnosis not present

## 2014-03-31 DIAGNOSIS — Z85828 Personal history of other malignant neoplasm of skin: Secondary | ICD-10-CM | POA: Diagnosis not present

## 2014-03-31 HISTORY — DX: Squamous cell carcinoma of skin, unspecified: C44.92

## 2014-03-31 NOTE — Telephone Encounter (Signed)
  Follow up Call-  Call back number 03/30/2014  Post procedure Call Back phone  # [phone]  Permission to leave phone message Yes     Patient questions:  Do you have a fever, pain , or abdominal swelling? No. Pain Score  0 *  Have you tolerated food without any problems? Yes.    Have you been able to return to your normal activities? Yes.    Do you have any questions about your discharge instructions: Diet   No. Medications  No. Follow up visit  No.  Do you have questions or concerns about your Care? No.  Actions: * If pain score is 4 or above: No action needed, pain <4.

## 2014-04-01 ENCOUNTER — Ambulatory Visit: Payer: Medicare Other

## 2014-04-04 ENCOUNTER — Other Ambulatory Visit: Payer: Self-pay | Admitting: Cardiovascular Disease

## 2014-04-06 ENCOUNTER — Telehealth: Payer: Self-pay

## 2014-04-06 NOTE — Telephone Encounter (Signed)
Pt states her Xarelto has went up significantly. Would like samples. Please call.

## 2014-04-06 NOTE — Telephone Encounter (Signed)
Placed samples of Xarelto 20 mg @ front desk for pick up. 

## 2014-04-08 ENCOUNTER — Ambulatory Visit
Admission: RE | Admit: 2014-04-08 | Discharge: 2014-04-08 | Disposition: A | Discharge status: Home / Self Care | Payer: Medicare Other | Source: Ambulatory Visit

## 2014-04-08 DIAGNOSIS — Z1231 Encounter for screening mammogram for malignant neoplasm of breast: Secondary | ICD-10-CM | POA: Diagnosis not present

## 2014-04-08 NOTE — Progress Notes (Signed)
Quick Note:  Biopsies show a flare of collagenous colitis Rec: entocort EC 9 mg daily # 90 3 mg tabs with 2 RF (let us know if too costly -then would use prednisone)  See me in 2 months - call back sooner if not improving adequately in time with tx  LEC - colon recall 10 years ______

## 2014-04-09 ENCOUNTER — Other Ambulatory Visit: Payer: Self-pay

## 2014-04-09 DIAGNOSIS — K52831 Collagenous colitis: Secondary | ICD-10-CM

## 2014-04-09 MED ORDER — BUDESONIDE 3 MG PO CP24: 9.0000 mg | ORAL_CAPSULE | Freq: Every day | ORAL | Status: DC

## 2014-04-09 NOTE — Addendum Note (Signed)
Addended by: Marlon Pel on: 04/09/2014 01:07 PM   Modules accepted: Orders

## 2014-04-14 ENCOUNTER — Telehealth: Payer: Self-pay | Admitting: Internal Medicine

## 2014-04-14 MED ORDER — PREDNISONE 10 MG PO TABS: ORAL_TABLET | ORAL | Status: DC

## 2014-04-14 NOTE — Telephone Encounter (Signed)
NO record of call this week.  Per Dr. Carlean Purl call in prednisone with the following taper  40 mg for 3 days 30 mg for 3 days 20 mg for 7 days 10 mg for 14 days 5 mg for 14 days  [place].  She is not able to get entocort due to cost.  She is advised to start on prednisone as above.  She is asked to keep her follow up for 06/08/14

## 2014-04-14 NOTE — Telephone Encounter (Signed)
Pt's daughter said the Pt called in on Monday and haven't received a call back.  Said she thought she had tried the Entocort med 8 years ago and had a bad reaction to the med and it is going to cost her over $300.  Wants someone to check her chart and verify it was the Entocort med.  Pt's daughter also stated that the [facility] in [facility] does not have the Entocort med and was told it would be in today.  When they called it is still not in.  Pt's daughter is requesting a call back today and asking to speak directly to [facility]

## 2014-04-26 DIAGNOSIS — Z85828 Personal history of other malignant neoplasm of skin: Secondary | ICD-10-CM | POA: Diagnosis not present

## 2014-04-26 DIAGNOSIS — L821 Other seborrheic keratosis: Secondary | ICD-10-CM | POA: Diagnosis not present

## 2014-04-26 DIAGNOSIS — L578 Other skin changes due to chronic exposure to nonionizing radiation: Secondary | ICD-10-CM | POA: Diagnosis not present

## 2014-04-27 DIAGNOSIS — R001 Bradycardia, unspecified: Secondary | ICD-10-CM | POA: Diagnosis not present

## 2014-04-27 DIAGNOSIS — F5101 Primary insomnia: Secondary | ICD-10-CM | POA: Diagnosis not present

## 2014-04-27 DIAGNOSIS — R531 Weakness: Secondary | ICD-10-CM | POA: Diagnosis not present

## 2014-04-27 DIAGNOSIS — G2581 Restless legs syndrome: Secondary | ICD-10-CM | POA: Diagnosis not present

## 2014-05-19 DIAGNOSIS — R21 Rash and other nonspecific skin eruption: Secondary | ICD-10-CM | POA: Diagnosis not present

## 2014-05-19 DIAGNOSIS — Z85828 Personal history of other malignant neoplasm of skin: Secondary | ICD-10-CM | POA: Diagnosis not present

## 2014-05-19 DIAGNOSIS — D485 Neoplasm of uncertain behavior of skin: Secondary | ICD-10-CM | POA: Diagnosis not present

## 2014-05-19 DIAGNOSIS — L578 Other skin changes due to chronic exposure to nonionizing radiation: Secondary | ICD-10-CM | POA: Diagnosis not present

## 2014-05-19 DIAGNOSIS — C44722 Squamous cell carcinoma of skin of right lower limb, including hip: Secondary | ICD-10-CM | POA: Diagnosis not present

## 2014-05-25 DIAGNOSIS — L570 Actinic keratosis: Secondary | ICD-10-CM | POA: Diagnosis not present

## 2014-06-08 ENCOUNTER — Encounter: Payer: Self-pay | Admitting: Internal Medicine

## 2014-06-08 ENCOUNTER — Ambulatory Visit (INDEPENDENT_AMBULATORY_CARE_PROVIDER_SITE_OTHER): Payer: Medicare Other | Admitting: Internal Medicine

## 2014-06-08 VITALS — BP 110/60 | HR 60 | Ht 68.0 in | Wt 143.0 lb

## 2014-06-08 DIAGNOSIS — K5289 Other specified noninfective gastroenteritis and colitis: Secondary | ICD-10-CM

## 2014-06-08 DIAGNOSIS — K52831 Collagenous colitis: Secondary | ICD-10-CM

## 2014-06-08 DIAGNOSIS — R1314 Dysphagia, pharyngoesophageal phase: Secondary | ICD-10-CM | POA: Diagnosis not present

## 2014-06-08 DIAGNOSIS — R079 Chest pain, unspecified: Secondary | ICD-10-CM | POA: Diagnosis not present

## 2014-06-08 MED ORDER — PREDNISONE 10 MG PO TABS: 10.0000 mg | ORAL_TABLET | Freq: Every day | ORAL | Status: DC

## 2014-06-08 NOTE — Progress Notes (Signed)
   Subjective:    Patient ID: [name], female    DOB: 04-Mar-1946, 68 y.o.   MRN: [id number]  HPI The patient is here with multiple chronic GI complaints.  She has collagenous colitis. He was documented to persistent recent colonoscopy in September. I prescribed Entocort but she's in the doughnut hole and she says she could not afford that. We tried prednisone and 40 mg daily dose made her tremulous and jittery and made her have some palpitations that she stopped. She tried Imodium when she was on that and in [facility] and felt like something make things better if she continues to use Imodium intermittently but continues to have most days in which she is bothered by urgent defecation and diarrhea or at least just frequent semi-formed stools.  She also continues to have intermittent chest pain. She had some candida esophagitis seen at the upper endoscopy in September as well which was treated from, [place]. She gets the spells were food sits in her chest, she feels is going to explode and she takes 2 or 3 over-the-counter Pepcid in the sensation eventually stops  Recurrence of the symptoms which are problems she has suffered with for decades, is bothering her and increasing her anxiety. I think she understands that it's not necessarily dangerous but it's clearly had disturbance regarding quality of life and her ability to travel and do things.  Medications, allergies, past medical history, past surgical history, family history and social history are reviewed and updated in the EMR.   Review of Systems In donut hole Due to Xarelto and her prescriptions are costly.    Objective:   Physical Exam Well-developed well-nourished no acute distress Eyes anicteric  Wt Readings from Last 3 Encounters:  06/08/14 143 lb (64.864 kg)  03/30/14 139 lb (63.05 kg)  02/22/14 139 lb 12.8 oz (63.413 kg)        Assessment & Plan:  Collagenous colitis  Dysphagia, pharyngoesophageal phase  Chest  pain, unspecified chest pain type  1. Prednisone 10 mg qd, perhaps she'll tolerated a lower dose  2. ba swallow to evaluate chest pain and dysphagia 3. Could need manometry 4. ? Different anxiety therapy or more is needed 5. I explained it was not entirely clear where the Candida esophagitis came from. She could need a repeat endoscopy to look for recurrence of that. But overall sense is that that's probably not responsible for the symptoms she has had over the years.it's possible it was just colonization, but I thought it was worthwhile treating given her symptoms.  CC: [name], MD Also copy Dr. Ida Rogue

## 2014-06-08 NOTE — Patient Instructions (Addendum)
Please continue your Prednisone 10mg  daily.  You have been scheduled for a Barium Esophogram at [facility] (1st floor of the hospital) on 06/17/15 at 9:30am. Please arrive 15 minutes prior to your appointment for registration. Make certain not to have anything to eat or drink 3 hours prior to your test. If you need to reschedule for any reason, please contact radiology at [phone] to do so. __________________________________________________________________ A barium swallow is an examination that concentrates on views of the esophagus. This tends to be a double contrast exam (barium and two liquids which, when combined, create a gas to distend the wall of the oesophagus) or single contrast (non-ionic iodine based). The study is usually tailored to your symptoms so a good history is essential. Attention is paid during the study to the form, structure and configuration of the esophagus, looking for functional disorders (such as aspiration, dysphagia, achalasia, motility and reflux) EXAMINATION You may be asked to change into a gown, depending on the type of swallow being performed. A radiologist and radiographer will perform the procedure. The radiologist will advise you of the type of contrast selected for your procedure and direct you during the exam. You will be asked to stand, sit or lie in several different positions and to hold a small amount of fluid in your mouth before being asked to swallow while the imaging is performed .In some instances you may be asked to swallow barium coated marshmallows to assess the motility of a solid food bolus. The exam can be recorded as a digital or video fluoroscopy procedure. POST PROCEDURE It will take 1-2 days for the barium to pass through your system. To facilitate this, it is important, unless otherwise directed, to increase your fluids for the next 24-48hrs and to resume your normal diet.  This test typically takes about 30 minutes to  perform. __________________________________________________________________________________  I appreciate the opportunity to care for you.

## 2014-06-11 ENCOUNTER — Encounter: Payer: Self-pay | Admitting: Cardiovascular Disease

## 2014-06-11 ENCOUNTER — Ambulatory Visit: Payer: Medicare Other | Admitting: Cardiovascular Disease

## 2014-06-11 ENCOUNTER — Ambulatory Visit (INDEPENDENT_AMBULATORY_CARE_PROVIDER_SITE_OTHER): Payer: Medicare Other | Admitting: Cardiovascular Disease

## 2014-06-11 VITALS — BP 104/62 | HR 68 | Ht 68.0 in | Wt 142.2 lb

## 2014-06-11 DIAGNOSIS — K589 Irritable bowel syndrome without diarrhea: Secondary | ICD-10-CM | POA: Diagnosis not present

## 2014-06-11 DIAGNOSIS — F419 Anxiety disorder, unspecified: Secondary | ICD-10-CM

## 2014-06-11 DIAGNOSIS — F17200 Nicotine dependence, unspecified, uncomplicated: Secondary | ICD-10-CM

## 2014-06-11 DIAGNOSIS — I4891 Unspecified atrial fibrillation: Secondary | ICD-10-CM

## 2014-06-11 DIAGNOSIS — Z720 Tobacco use: Secondary | ICD-10-CM | POA: Diagnosis not present

## 2014-06-11 NOTE — Assessment & Plan Note (Signed)
Rare episodes of anxiety, these seem to bring on her episodes of atrial fibrillation.

## 2014-06-11 NOTE — Assessment & Plan Note (Signed)
Paroxysmal atrial fibrillation, very rare episodes on her current medication regimen. No medication changes made. 

## 2014-06-11 NOTE — Assessment & Plan Note (Signed)
We have encouraged her to continue to work on weaning her cigarettes and smoking cessation. She will continue to work on this and does not want any assistance with chantix.  

## 2014-06-11 NOTE — Progress Notes (Signed)
Patient ID: [name], female    DOB: [birth year], 68 y.o.   MRN: [id number]  HPI Comments: [name] is a 68 year old woman with a history of palpitations and tachycardia, paroxysmal atrial fibrillation who presents for routine followup of her atrial fibrillation. In the past, she has had Several episodes of atrial fibrillation, one requiring cardioversion. Previous smoking history, stopped in 2012  In follow-up today, she reports having rare episodes of atrial fibrillation. She has continued on flecainide 50 mrem twice a day with metoprolol. Occasionally has atrial fibrillation when she has significant stress and she takes extra flecainide with improvement of her symptoms.  Her biggest complaint is her GI issues. She reports having diverticulitis, ulcerative colitis. Seen recently by GI, started on prednisone She feels that the prednisone is giving her more tachycardia and fluttering and she is trying to reduce the dose to limit her symptoms Otherwise she feels well Tolerating anticoagulation  EKG shows normal sinus rhythm with rate 68 bpm, no significant ST or T-wave changes No recent lipid panel available. Lab some 2012 showed total cholesterol 187, LDL 92 She's not interested in a cholesterol medication  Other past medical history  echocardiogram done at [facility] 01/17/2012 - Left ventricle: The cavity size was normal. Wall thickness was normal. Systolic function was normal. The estimated   ejection fraction was in the range of 55% to 60%. Wall motion was normal; there were no regional wall motion abnormalities. - Mitral valve: Mild regurgitation.       Outpatient Encounter Prescriptions as of 06/11/2014  Medication Sig  . ALPRAZolam (XANAX) 0.5 MG tablet Take 0.5 mg by mouth at bedtime. For anxiety  . Cetirizine HCl 10 MG CAPS Take by mouth.  . diltiazem (CARDIZEM) 30 MG tablet Take 1 tablet (30 mg total) by mouth 3 (three) times daily as needed (for breakthrough atrial  fibrillation).  . famotidine (PEPCID) 40 MG tablet Take 40 mg by mouth as needed.  . flecainide (TAMBOCOR) 50 MG tablet TAKE ONE TABLET BY MOUTH TWICE DAILY  . metoprolol succinate (TOPROL-XL) 25 MG 24 hr tablet TAKE 1 TABLET BY MOUTH DAILY   WITH OR IMMEDIATELY FOLLOWING A MEAL. (Patient taking differently: TAKE 1/2 TABLET BY MOUTH DAILY   WITH OR IMMEDIATELY FOLLOWING A MEAL.)  . predniSONE (DELTASONE) 10 MG tablet Take 1 tablet (10 mg total) by mouth daily with breakfast.  . propranolol (INDERAL) 20 MG tablet Take 1 tablet (20 mg total) by mouth 3 (three) times daily as needed (for breakthrough atrial fibrillation). (Patient taking differently: Take 20 mg by mouth 3 (three) times daily as needed (for breakthrough atrial fibrillation). Pt uses for emergency use only)  . Simethicone (GAS-X PO) Take 1 tablet by mouth as needed. For gas  . XARELTO 20 MG TABS tablet TAKE ONE TABLET BY MOUTH EVERY DAY   Social history  reports that she quit smoking about 2 years ago. Her smoking use included Cigarettes. She has a 4 pack-year smoking history. She has never used smokeless tobacco. She reports that she does not drink alcohol or use illicit drugs.  Review of Systems  Constitutional: Negative.   Respiratory: Negative.   Cardiovascular: Positive for palpitations.       Tachycardia  Gastrointestinal: Negative.   Musculoskeletal: Negative.   Neurological: Negative.   Hematological: Negative.   Psychiatric/Behavioral: Negative.   All other systems reviewed and are negative.   BP 104/62 mmHg  Pulse 68  Ht 5\' 8"  (1.727 m)  Wt 142  lb 4 oz (64.524 kg)  BMI 21.63 kg/m2  Physical Exam  Constitutional: She is oriented to person, place, and time. She appears well-developed and well-nourished.  HENT:  Head: Normocephalic.  Nose: Nose normal.  Mouth/Throat: Oropharynx is clear and moist.  Eyes: Conjunctivae are normal. Pupils are equal, round, and reactive to light.  Neck: Normal range of motion.  Neck supple. No JVD present.  Cardiovascular: Normal rate, regular rhythm, S1 normal, S2 normal, normal heart sounds and intact distal pulses.  Exam reveals no gallop and no friction rub.   No murmur heard. Pulmonary/Chest: Effort normal and breath sounds normal. No respiratory distress. She has no wheezes. She has no rales. She exhibits no tenderness.  Abdominal: Soft. Bowel sounds are normal. She exhibits no distension. There is no tenderness.  Musculoskeletal: Normal range of motion. She exhibits no edema or tenderness.  Lymphadenopathy:    She has no cervical adenopathy.  Neurological: She is alert and oriented to person, place, and time. Coordination normal.  Skin: Skin is warm and dry. No rash noted. No erythema.  Psychiatric: She has a normal mood and affect. Her behavior is normal. Judgment and thought content normal.    Assessment and Plan  Nursing note and vitals reviewed.

## 2014-06-11 NOTE — Patient Instructions (Signed)
You are doing well. No medication changes were made.  Please call us if you have new issues that need to be addressed before your next appt.  Your physician wants you to follow-up in: 6 months.  You will receive a reminder letter in the mail two months in advance. If you don't receive a letter, please call our office to schedule the follow-up appointment.   

## 2014-06-11 NOTE — Assessment & Plan Note (Signed)
She reports having diverticuli, ulcerative colitis. She is being followed closely by GI. Currently on low-dose prednisone

## 2014-06-16 ENCOUNTER — Ambulatory Visit (HOSPITAL_COMMUNITY): Payer: Medicare Other

## 2014-06-21 ENCOUNTER — Other Ambulatory Visit (HOSPITAL_COMMUNITY): Payer: Medicare Other

## 2014-06-21 ENCOUNTER — Ambulatory Visit (HOSPITAL_COMMUNITY)
Admission: RE | Admit: 2014-06-21 | Discharge: 2014-06-21 | Disposition: A | Discharge status: Home / Self Care | Payer: Medicare Other | Source: Ambulatory Visit | Attending: Internal Medicine | Admitting: Internal Medicine

## 2014-06-21 DIAGNOSIS — R079 Chest pain, unspecified: Secondary | ICD-10-CM | POA: Insufficient documentation

## 2014-06-21 DIAGNOSIS — L570 Actinic keratosis: Secondary | ICD-10-CM | POA: Diagnosis not present

## 2014-06-21 DIAGNOSIS — R1310 Dysphagia, unspecified: Secondary | ICD-10-CM | POA: Diagnosis not present

## 2014-06-21 DIAGNOSIS — R1314 Dysphagia, pharyngoesophageal phase: Secondary | ICD-10-CM

## 2014-06-21 NOTE — Progress Notes (Signed)
Quick Note:  This is ok - no esophageal function problems or strictures identified I would like to see her in f/u next available to review options and f/u collagenous colitis - ______

## 2014-06-23 DIAGNOSIS — S81811A Laceration without foreign body, right lower leg, initial encounter: Secondary | ICD-10-CM | POA: Diagnosis not present

## 2014-07-14 ENCOUNTER — Telehealth: Payer: Self-pay | Admitting: Cardiovascular Disease

## 2014-07-14 NOTE — Telephone Encounter (Signed)
Pt needing samples of xerlto, pt is in donut hole. States she only has until Friday, would like to come and pick them up.

## 2014-07-14 NOTE — Telephone Encounter (Signed)
Placed samples Xarelto 20 mg @ front desk for pick up.

## 2014-08-03 ENCOUNTER — Other Ambulatory Visit: Payer: Self-pay | Admitting: Cardiovascular Disease

## 2014-08-04 ENCOUNTER — Ambulatory Visit (INDEPENDENT_AMBULATORY_CARE_PROVIDER_SITE_OTHER): Payer: Medicare Other | Admitting: Internal Medicine

## 2014-08-04 ENCOUNTER — Encounter: Payer: Self-pay | Admitting: Internal Medicine

## 2014-08-04 VITALS — BP 112/60 | HR 56 | Ht 68.0 in | Wt 144.1 lb

## 2014-08-04 DIAGNOSIS — K5289 Other specified noninfective gastroenteritis and colitis: Secondary | ICD-10-CM | POA: Diagnosis not present

## 2014-08-04 DIAGNOSIS — R079 Chest pain, unspecified: Secondary | ICD-10-CM

## 2014-08-04 DIAGNOSIS — K52831 Collagenous colitis: Secondary | ICD-10-CM

## 2014-08-04 NOTE — Assessment & Plan Note (Signed)
Watch swallowing more carefully, follow pills and food with water/liquids

## 2014-08-04 NOTE — Assessment & Plan Note (Signed)
Seems to be ok on prn loperamide Consider colestipol or Pepto-Bismol if needed though she reports some harder stools lately so I suspect some of her issues are IBS

## 2014-08-04 NOTE — Progress Notes (Signed)
   Subjective:    Patient ID: [name], female    DOB: Jul 29, 1945, 69 y.o.   MRN: [id number]  HPI Managing on loperamide for collagenous colitis. Some loose stools and urgent defecation. Prednisone even at 10 mg gave her palpitations.  She has been drinking water after swallowing food and pills and has not had dysphagia/chest pain. Ba swallow, EGD have been ok.  Medications, allergies, past medical history, past surgical history, family history and social history are reviewed and updated in the EMR.  Review of Systems As above    Objective:   Physical Exam  NAD Wt Readings from Last 3 Encounters:  08/04/14 144 lb 2 oz (65.375 kg)  06/11/14 142 lb 4 oz (64.524 kg)  06/08/14 143 lb (64.864 kg)       Assessment & Plan:  Collagenous colitis Seems to be ok on prn loperamide Consider colestipol or Pepto-Bismol if needed though she reports some harder stools lately so I suspect some of her issues are IBS   Chest pain Watch swallowing more carefully, follow pills and food with water/liquids   F/U PRN

## 2014-08-23 DIAGNOSIS — Z85828 Personal history of other malignant neoplasm of skin: Secondary | ICD-10-CM | POA: Diagnosis not present

## 2014-08-23 DIAGNOSIS — L578 Other skin changes due to chronic exposure to nonionizing radiation: Secondary | ICD-10-CM | POA: Diagnosis not present

## 2014-08-23 DIAGNOSIS — L570 Actinic keratosis: Secondary | ICD-10-CM | POA: Diagnosis not present

## 2014-08-23 DIAGNOSIS — Z1283 Encounter for screening for malignant neoplasm of skin: Secondary | ICD-10-CM | POA: Diagnosis not present

## 2014-08-23 DIAGNOSIS — L820 Inflamed seborrheic keratosis: Secondary | ICD-10-CM | POA: Diagnosis not present

## 2014-08-23 DIAGNOSIS — L821 Other seborrheic keratosis: Secondary | ICD-10-CM | POA: Diagnosis not present

## 2014-08-31 ENCOUNTER — Other Ambulatory Visit: Payer: Self-pay | Admitting: Cardiovascular Disease

## 2014-11-01 DIAGNOSIS — F5101 Primary insomnia: Secondary | ICD-10-CM | POA: Diagnosis not present

## 2014-11-01 DIAGNOSIS — M810 Age-related osteoporosis without current pathological fracture: Secondary | ICD-10-CM | POA: Diagnosis not present

## 2014-11-01 DIAGNOSIS — K580 Irritable bowel syndrome with diarrhea: Secondary | ICD-10-CM | POA: Diagnosis not present

## 2014-12-08 ENCOUNTER — Ambulatory Visit (INDEPENDENT_AMBULATORY_CARE_PROVIDER_SITE_OTHER): Payer: Medicare Other | Admitting: Cardiovascular Disease

## 2014-12-08 ENCOUNTER — Encounter (INDEPENDENT_AMBULATORY_CARE_PROVIDER_SITE_OTHER): Payer: Self-pay

## 2014-12-08 ENCOUNTER — Encounter: Payer: Self-pay | Admitting: Cardiovascular Disease

## 2014-12-08 VITALS — BP 120/60 | HR 56 | Ht 68.0 in | Wt 141.8 lb

## 2014-12-08 DIAGNOSIS — R0602 Shortness of breath: Secondary | ICD-10-CM | POA: Diagnosis not present

## 2014-12-08 DIAGNOSIS — R079 Chest pain, unspecified: Secondary | ICD-10-CM

## 2014-12-08 DIAGNOSIS — I4891 Unspecified atrial fibrillation: Secondary | ICD-10-CM | POA: Diagnosis not present

## 2014-12-08 DIAGNOSIS — R000 Tachycardia, unspecified: Secondary | ICD-10-CM

## 2014-12-08 DIAGNOSIS — F17200 Nicotine dependence, unspecified, uncomplicated: Secondary | ICD-10-CM

## 2014-12-08 DIAGNOSIS — Z8249 Family history of ischemic heart disease and other diseases of the circulatory system: Secondary | ICD-10-CM | POA: Diagnosis not present

## 2014-12-08 DIAGNOSIS — Z720 Tobacco use: Secondary | ICD-10-CM

## 2014-12-08 DIAGNOSIS — E785 Hyperlipidemia, unspecified: Secondary | ICD-10-CM

## 2014-12-08 NOTE — Assessment & Plan Note (Signed)
We have encouraged her to continue smoking cessation. She does not want any assistance with chantix.

## 2014-12-08 NOTE — Assessment & Plan Note (Signed)
Rare episodes of tachycardia, often short-lived. Relieved with resting, occasionally takes extra flecainide

## 2014-12-08 NOTE — Assessment & Plan Note (Signed)
Rare episodes of chest pain, some typical and atypical features. She has strong family history of coronary artery disease, father. We have discussed various options for evaluation. She has declined stress testing,  we will order a CT coronary calcium score for risk stratification given chest pain, episodes of shortness of breath, family history of CAD

## 2014-12-08 NOTE — Assessment & Plan Note (Signed)
Paroxysmal atrial fibrillation, very rare episodes on her current medication regimen. No medication changes made.

## 2014-12-08 NOTE — Patient Instructions (Addendum)
You are doing well. No medication changes were made.  We will order liver and lipids, fasting  You are scheduled for a CT coronary calcium score for family hx of coronary disease, chest pain, shortness of breath Friday, May 20 @ 1:00, please arrive at 12:45 There is a one-time fee of $150.00 due at the time of your procedure  Please call us if you have new issues that need to be addressed before your next appt.  Your physician wants you to follow-up in: 6 months.  You will receive a reminder letter in the mail two months in advance. If you don't receive a letter, please call our office to schedule the follow-up appointment.

## 2014-12-08 NOTE — Progress Notes (Signed)
Patient ID: [name], female    DOB: 1945/11/18, 69 y.o.   MRN: [id number]  HPI Comments: Mrs Lininger is a 69 year old woman with a history of palpitations and tachycardia, paroxysmal atrial fibrillation who presents for routine followup of her atrial fibrillation. In the past, she has had Several episodes of atrial fibrillation, one requiring cardioversion. Previous smoking history, stopped in 2012  In follow-up today, she reports having rare episodes of atrial fibrillation.  She has continued on flecainide 50 mg twice a day with metoprolol.  Occasionally she takes extra flecainide when she feels she might be close to developing atrial fibrillation  Recent stressor as she has moved into new townhouse Having less of an issue with GI on today's visit. Previously reported having diverticulitis, ulcerative colitis. Given prednisone Tolerating anticoagulation  EKG on today's visit shows normal sinus rhythm with rate 57 bpm, no significant ST or T-wave changes  No recent lipid panel available. Lab some 2012 showed total cholesterol 187, LDL 92  Other past medical history  echocardiogram done at [facility] 01/17/2012 - Left ventricle: The cavity size was normal. Wall thickness was normal. Systolic function was normal. The estimated   ejection fraction was in the range of 55% to 60%. Wall motion was normal; there were no regional wall motion abnormalities. - Mitral valve: Mild regurgitation.       Allergies  Allergen Reactions  . Latex   . Adhesive [Tape] Rash  . Aspirin Other (See Comments)    Aggravates Acid Reflux  . Prednisone Palpitations    Made pts heart race    Current Outpatient Prescriptions on File Prior to Visit  Medication Sig Dispense Refill  . ALPRAZolam (XANAX) 0.5 MG tablet Take 0.5 mg by mouth at bedtime. For anxiety    . Cetirizine HCl 10 MG CAPS Take by mouth as needed.     . diltiazem (CARDIZEM) 30 MG tablet Take 1 tablet (30 mg total) by mouth 3  (three) times daily as needed (for breakthrough atrial fibrillation). 90 tablet 4  . famotidine (PEPCID) 40 MG tablet Take 40 mg by mouth as needed.    . flecainide (TAMBOCOR) 50 MG tablet TAKE ONE TABLET BY MOUTH TWICE DAILY 180 tablet 3  . metoprolol succinate (TOPROL-XL) 25 MG 24 hr tablet TAKE 1 TABLET BY MOUTH DAILY   WITH OR IMMEDIATELY FOLLOWING A MEAL. 30 tablet 3  . propranolol (INDERAL) 20 MG tablet Take 1 tablet (20 mg total) by mouth 3 (three) times daily as needed (for breakthrough atrial fibrillation). (Patient taking differently: Take 20 mg by mouth 3 (three) times daily as needed (for breakthrough atrial fibrillation). Pt uses for emergency use only) 90 tablet 4  . Simethicone (GAS-X PO) Take 1 tablet by mouth as needed. For gas    . XARELTO 20 MG TABS tablet TAKE ONE TABLET BY MOUTH EVERY DAY 30 tablet 6   No current facility-administered medications on file prior to visit.    Past Medical History  Diagnosis Date  . Mitral valve disorders     a. 12/2011 Echo: EF 55-60%, NRWMA, No evidence of MVP, Mild MR  . Collagenous colitis   . Osteoporosis   . GERD (gastroesophageal reflux disease)   . IBS (irritable bowel syndrome)   . Internal hemorrhoids 2010    Colon-Dr. Vira Agar   . Schatzki's ring 2010     EGD- Dr. Vira Agar   . Hiatal hernia 2004  . Cataract   . Hyperlipidemia   . Midsternal  chest pain     a. 01/2011 Ex Mv: EF 68%, No ischemia.  . Squamous cell carcinoma of skin     multiple  . Heart murmur   . Atrial fibrillation     a. diagnosed 12/2011  . Migraine 01/16/12    "none now for several years"  . Arthritis     "fingers; right shoulder"  . Kidney stones   . Broken ribs   . Esophageal candidiasis   . Ulcerative colitis   . Diverticulosis     Past Surgical History  Procedure Laterality Date  . Upper gastrointestinal endoscopy  Multiple    w/dilation, hiatal hernia  . Colonoscopy  Multiple  . Vaginal hysterectomy  1972  . Tonsillectomy and adenoidectomy   1975  . Appendectomy  1963  . Ovarian cyst removal  1990's  . Shoulder arthroscopy w/ rotator cuff repair Right ~ 2005  . Breast cyst aspiration Bilateral     "several; both sides"  . Squamous cell carcinoma excision      "10-12 removed; all over my body - nose, predominately legs"  . Cystoscopy w/ stone manipulation  1980's  . Cardioversion  01/16/2012    Procedure: CARDIOVERSION;  Surgeon: Evans Lance, MD;  Location: [facility] OR;  Service: Cardiovascular;  Laterality: N/A;  . Leg surgery      skin cancer removal    Social History  reports that she quit smoking about 2 years ago. Her smoking use included Cigarettes. She has a 4 pack-year smoking history. She has never used smokeless tobacco. She reports that she does not drink alcohol or use illicit drugs.  Family History family history includes Alzheimer's disease in her mother; Diabetes in her father; Heart disease in her father; Irritable bowel syndrome in her father; Kidney disease in her father. There is no history of Colon cancer.   Review of Systems  Constitutional: Negative.   Respiratory: Negative.   Cardiovascular: Negative.   Gastrointestinal: Negative.   Musculoskeletal: Negative.   Neurological: Negative.   Hematological: Negative.   Psychiatric/Behavioral: Negative.   All other systems reviewed and are negative.   BP 120/60 mmHg  Pulse 56  Ht 5\' 8"  (1.727 m)  Wt 141 lb 12 oz (64.297 kg)  BMI 21.56 kg/m2  Physical Exam  Constitutional: She is oriented to person, place, and time. She appears well-developed and well-nourished.  HENT:  Head: Normocephalic.  Nose: Nose normal.  Mouth/Throat: Oropharynx is clear and moist.  Eyes: Conjunctivae are normal. Pupils are equal, round, and reactive to light.  Neck: Normal range of motion. Neck supple. No JVD present.  Cardiovascular: Normal rate, regular rhythm, S1 normal, S2 normal, normal heart sounds and intact distal pulses.  Exam reveals no gallop and no friction  rub.   No murmur heard. Pulmonary/Chest: Effort normal and breath sounds normal. No respiratory distress. She has no wheezes. She has no rales. She exhibits no tenderness.  Abdominal: Soft. Bowel sounds are normal. She exhibits no distension. There is no tenderness.  Musculoskeletal: Normal range of motion. She exhibits no edema or tenderness.  Lymphadenopathy:    She has no cervical adenopathy.  Neurological: She is alert and oriented to person, place, and time. Coordination normal.  Skin: Skin is warm and dry. No rash noted. No erythema.  Psychiatric: She has a normal mood and affect. Her behavior is normal. Judgment and thought content normal.    Assessment and Plan  Nursing note and vitals reviewed.

## 2014-12-10 ENCOUNTER — Ambulatory Visit (INDEPENDENT_AMBULATORY_CARE_PROVIDER_SITE_OTHER)
Admission: RE | Admit: 2014-12-10 | Discharge: 2014-12-10 | Disposition: A | Discharge status: Home / Self Care | Payer: Self-pay | Source: Ambulatory Visit | Attending: Cardiovascular Disease | Admitting: Cardiovascular Disease

## 2014-12-10 DIAGNOSIS — I4891 Unspecified atrial fibrillation: Secondary | ICD-10-CM

## 2014-12-10 DIAGNOSIS — Z8249 Family history of ischemic heart disease and other diseases of the circulatory system: Secondary | ICD-10-CM

## 2014-12-10 DIAGNOSIS — R0602 Shortness of breath: Secondary | ICD-10-CM

## 2014-12-10 DIAGNOSIS — R079 Chest pain, unspecified: Secondary | ICD-10-CM

## 2014-12-16 DIAGNOSIS — L578 Other skin changes due to chronic exposure to nonionizing radiation: Secondary | ICD-10-CM | POA: Diagnosis not present

## 2014-12-16 DIAGNOSIS — L814 Other melanin hyperpigmentation: Secondary | ICD-10-CM | POA: Diagnosis not present

## 2014-12-16 DIAGNOSIS — Z85828 Personal history of other malignant neoplasm of skin: Secondary | ICD-10-CM | POA: Diagnosis not present

## 2014-12-16 DIAGNOSIS — L821 Other seborrheic keratosis: Secondary | ICD-10-CM | POA: Diagnosis not present

## 2014-12-16 DIAGNOSIS — L820 Inflamed seborrheic keratosis: Secondary | ICD-10-CM | POA: Diagnosis not present

## 2014-12-22 ENCOUNTER — Other Ambulatory Visit: Payer: Self-pay | Admitting: Cardiovascular Disease

## 2014-12-22 ENCOUNTER — Other Ambulatory Visit: Payer: Self-pay

## 2014-12-22 DIAGNOSIS — E785 Hyperlipidemia, unspecified: Secondary | ICD-10-CM | POA: Diagnosis not present

## 2014-12-22 DIAGNOSIS — E7800 Pure hypercholesterolemia, unspecified: Secondary | ICD-10-CM

## 2014-12-22 MED ORDER — ROSUVASTATIN CALCIUM 5 MG PO TABS: 5.0000 mg | ORAL_TABLET | Freq: Every day | ORAL | Status: DC

## 2014-12-23 ENCOUNTER — Other Ambulatory Visit: Payer: Self-pay

## 2014-12-23 DIAGNOSIS — E785 Hyperlipidemia, unspecified: Secondary | ICD-10-CM

## 2014-12-23 LAB — HEPATIC FUNCTION PANEL
ALT: 15 IU/L (ref 0–32)
AST: 19 IU/L (ref 0–40)
Albumin: 4.1 g/dL (ref 3.6–4.8)
Alkaline Phosphatase: 47 IU/L (ref 39–117)
Bilirubin Total: 0.5 mg/dL (ref 0.0–1.2)
Bilirubin, Direct: 0.12 mg/dL (ref 0.00–0.40)
TOTAL PROTEIN: 6.1 g/dL (ref 6.0–8.5)

## 2014-12-23 LAB — LIPID PANEL W/O CHOL/HDL RATIO
CHOLESTEROL TOTAL: 185 mg/dL (ref 100–199)
HDL: 72 mg/dL (ref >39)
LDL Calculated: 100 mg/dL — ABNORMAL HIGH (ref 0–99)
Triglycerides: 63 mg/dL (ref 0–149)
VLDL CHOLESTEROL CAL: 13 mg/dL (ref 5–40)

## 2015-02-07 ENCOUNTER — Other Ambulatory Visit: Payer: Self-pay | Admitting: *Deleted

## 2015-02-07 MED ORDER — FLECAINIDE ACETATE 50 MG PO TABS: ORAL_TABLET | ORAL | Status: DC

## 2015-03-09 ENCOUNTER — Other Ambulatory Visit: Payer: Self-pay | Admitting: Cardiovascular Disease

## 2015-03-10 ENCOUNTER — Telehealth: Payer: Self-pay | Admitting: Cardiovascular Disease

## 2015-03-10 NOTE — Telephone Encounter (Signed)
Left message on vm to notify pt.

## 2015-03-10 NOTE — Telephone Encounter (Signed)
Patient calling the office for samples of medication:   1.  What medication and dosage are you requesting samples for?  Xarelto 20 mg once daily   2.  Are you currently out of this medication?    No, has 4-5 days left on current bottle   3. Are you requesting samples to get you through until a mail order prescription arrives?  Patient in donut hole

## 2015-03-10 NOTE — Telephone Encounter (Signed)
Xarelto 20 mg samples placed at front desk for pick up. 

## 2015-03-18 ENCOUNTER — Other Ambulatory Visit: Payer: Self-pay

## 2015-03-18 DIAGNOSIS — Z1231 Encounter for screening mammogram for malignant neoplasm of breast: Secondary | ICD-10-CM

## 2015-04-05 ENCOUNTER — Telehealth: Payer: Self-pay

## 2015-04-05 NOTE — Telephone Encounter (Signed)
Samples at front desk for pick up. 

## 2015-04-05 NOTE — Telephone Encounter (Signed)
Pt is in the donut hole and needs some Xarelto samples. Please call. She is completely out

## 2015-04-22 ENCOUNTER — Ambulatory Visit: Payer: Medicare Other

## 2015-04-27 ENCOUNTER — Telehealth: Payer: Self-pay

## 2015-04-27 NOTE — Telephone Encounter (Signed)
Pt needs Xarelto samples. At least 13 pills, pt is going out of town . Please call pt and let know when ready.

## 2015-04-27 NOTE — Telephone Encounter (Signed)
Xarelto 20 mg samples placed at front desk for pick up. 

## 2015-05-03 DIAGNOSIS — M810 Age-related osteoporosis without current pathological fracture: Secondary | ICD-10-CM | POA: Diagnosis not present

## 2015-05-03 DIAGNOSIS — F5101 Primary insomnia: Secondary | ICD-10-CM | POA: Diagnosis not present

## 2015-05-03 DIAGNOSIS — Z0001 Encounter for general adult medical examination with abnormal findings: Secondary | ICD-10-CM | POA: Diagnosis not present

## 2015-05-03 DIAGNOSIS — G2581 Restless legs syndrome: Secondary | ICD-10-CM | POA: Diagnosis not present

## 2015-05-25 ENCOUNTER — Ambulatory Visit: Payer: Medicare Other

## 2015-05-26 DIAGNOSIS — M79671 Pain in right foot: Secondary | ICD-10-CM | POA: Diagnosis not present

## 2015-05-26 DIAGNOSIS — M12571 Traumatic arthropathy, right ankle and foot: Secondary | ICD-10-CM | POA: Diagnosis not present

## 2015-05-26 DIAGNOSIS — M722 Plantar fascial fibromatosis: Secondary | ICD-10-CM | POA: Diagnosis not present

## 2015-06-07 DIAGNOSIS — H04123 Dry eye syndrome of bilateral lacrimal glands: Secondary | ICD-10-CM | POA: Diagnosis not present

## 2015-06-10 ENCOUNTER — Encounter: Payer: Self-pay | Admitting: Cardiovascular Disease

## 2015-06-10 ENCOUNTER — Ambulatory Visit (INDEPENDENT_AMBULATORY_CARE_PROVIDER_SITE_OTHER): Payer: Medicare Other | Admitting: Cardiovascular Disease

## 2015-06-10 VITALS — BP 90/62 | HR 57 | Ht 68.0 in | Wt 144.0 lb

## 2015-06-10 DIAGNOSIS — R0602 Shortness of breath: Secondary | ICD-10-CM | POA: Diagnosis not present

## 2015-06-10 DIAGNOSIS — I4891 Unspecified atrial fibrillation: Secondary | ICD-10-CM

## 2015-06-10 DIAGNOSIS — R079 Chest pain, unspecified: Secondary | ICD-10-CM | POA: Diagnosis not present

## 2015-06-10 DIAGNOSIS — Z7901 Long term (current) use of anticoagulants: Secondary | ICD-10-CM

## 2015-06-10 DIAGNOSIS — I25700 Atherosclerosis of coronary artery bypass graft(s), unspecified, with unstable angina pectoris: Secondary | ICD-10-CM | POA: Diagnosis not present

## 2015-06-10 DIAGNOSIS — F17200 Nicotine dependence, unspecified, uncomplicated: Secondary | ICD-10-CM

## 2015-06-10 DIAGNOSIS — Z720 Tobacco use: Secondary | ICD-10-CM

## 2015-06-10 MED ORDER — ROSUVASTATIN CALCIUM 5 MG PO TABS: 5.0000 mg | ORAL_TABLET | Freq: Every day | ORAL | Status: DC

## 2015-06-10 MED ORDER — METOPROLOL SUCCINATE ER 25 MG PO TB24: 25.0000 mg | ORAL_TABLET | Freq: Every day | ORAL | Status: DC

## 2015-06-10 MED ORDER — FLECAINIDE ACETATE 50 MG PO TABS: ORAL_TABLET | ORAL | Status: DC

## 2015-06-10 MED ORDER — RIVAROXABAN 20 MG PO TABS: 20.0000 mg | ORAL_TABLET | Freq: Every day | ORAL | Status: DC

## 2015-06-10 NOTE — Patient Instructions (Addendum)
You are doing well.  Please try the crestor daily Repeat labs in 3 months Call if you get leg cramps  We will schedule a treadmill myoview for chest pain, shortness of breath and CAD  Please call if chest pain symptoms get worse Cardiac cath could be performed  Please call us if you have new issues that need to be addressed before your next appt.  Your physician wants you to follow-up in: 6 months.  You will receive a reminder letter in the mail two months in advance. If you don't receive a letter, please call our office to schedule the follow-up appointment.  [place]  Your caregiver has ordered a Stress Test with nuclear imaging. The purpose of this test is to evaluate the blood supply to your heart muscle. This procedure is referred to as a "Non-Invasive Stress Test." This is because other than having an IV started in your vein, nothing is inserted or "invades" your body. Cardiac stress tests are done to find areas of poor blood flow to the heart by determining the extent of coronary artery disease (CAD). Some patients exercise on a treadmill, which naturally increases the blood flow to your heart, while others who are  unable to walk on a treadmill due to physical limitations have a pharmacologic/chemical stress agent called Lexiscan . This medicine will mimic walking on a treadmill by temporarily increasing your coronary blood flow.   Please note: these test may take anywhere between 2-4 hours to complete  PLEASE REPORT TO [facility] AT THE FIRST DESK WILL DIRECT YOU WHERE TO GO  Date of Procedure:____Tuesday, November 29________  Arrival Time for Procedure:_____7:15 am________________  Instructions regarding medication:   __X__:  Hold METOPROLOL & PROPRANOLOL the night before and morning of procedure  How to prepare for your Myoview test:   Do not eat or drink after midnight  No caffeine for 24 hours prior to test  No smoking 24 hours  prior to test.  Your medication may be taken with water.  If your doctor stopped a medication because of this test, do not take that medication  Ladies, please do not wear dresses.  Skirts or pants are appropriate. Please wear a short sleeve shirt.  No perfume, cologne or lotion.  Wear comfortable walking shoes. No heels!   Cardiac Nuclear Scanning A cardiac nuclear scan is used to check your heart for problems, such as the following:  A portion of the heart is not getting enough blood.  Part of the heart muscle has died, which happens with a heart attack.  The heart wall is not working normally.  In this test, a radioactive dye (tracer) is injected into your bloodstream. After the tracer has traveled to your heart, a scanning device is used to measure how much of the tracer is absorbed by or distributed to various areas of your heart. LET [facility] CARE PROVIDER KNOW ABOUT:  Any allergies you have.  All medicines you are taking, including vitamins, herbs, eye drops, creams, and over-the-counter medicines.  Previous problems you or members of your family have had with the use of anesthetics.  Any blood disorders you have.  Previous surgeries you have had.  Medical conditions you have.  RISKS AND COMPLICATIONS Generally, this is a safe procedure. However, as with any procedure, problems can occur. Possible problems include:   Serious chest pain.  Rapid heartbeat.  Sensation of warmth in your chest. This usually passes quickly. BEFORE THE PROCEDURE  Ask your health care provider about changing or stopping your regular medicines. PROCEDURE This procedure is usually done at a hospital and takes 2-4 hours.  An IV tube is inserted into one of your veins.  Your health care provider will inject a small amount of radioactive tracer through the tube.  You will then wait for 20-40 minutes while the tracer travels through your bloodstream.  You will lie down on an exam  table so images of your heart can be taken. Images will be taken for about 15-20 minutes.  You will exercise on a treadmill or stationary bike. While you exercise, your heart activity will be monitored with an electrocardiogram (ECG), and your blood pressure will be checked.  If you are unable to exercise, you may be given a medicine to make your heart beat faster.  When blood flow to your heart has peaked, tracer will again be injected through the IV tube.  After 20-40 minutes, you will get back on the exam table and have more images taken of your heart.  When the procedure is over, your IV tube will be removed. AFTER THE PROCEDURE  You will likely be able to leave shortly after the test. Unless your health care provider tells you otherwise, you may return to your normal schedule, including diet, activities, and medicines.  Make sure you find out how and when you will get your test results.   This information is not intended to replace advice given to you by your health care provider. Make sure you discuss any questions you have with your health care provider.   Document Released: 08/03/2004 Document Revised: 07/14/2013 Document Reviewed: 06/17/2013 Elsevier Interactive Patient Education [employer].

## 2015-06-10 NOTE — Progress Notes (Signed)
Patient ID: GWENITH COKER, female    DOB: [birth year], 69 y.o.   MRN: [id number]  HPI Comments: Mrs Hiser is a 69 year old woman with a history of palpitations and tachycardia, paroxysmal atrial fibrillation who presents for routine followup of her atrial fibrillation. In the past, she has had Several episodes of atrial fibrillation, one requiring cardioversion. Previous smoking history, stopped in 2012 Recent CT coronary calcium score showing mid LAD coronary artery disease  In follow-up today, she reports walking in [place] at a festival carrying heavy items in both hands, think she may have overdid it as she developed tightness in her chest, shortness of breath. Had to sit down for symptoms to resolve Reports having very mild symptoms in the past, this episode was more severe Does not think it was an episode of atrial fibrillation. These episodes are rare  Recent CT coronary calcium score of 50, most of her disease in the mid LAD Mild aorta atherosclerosis  She has continued on flecainide 50 mg twice a day with metoprolol.  Occasionally she takes extra flecainide when she feels she might be close to developing atrial fibrillation  EKG on today's visit shows normal sinus rhythm with rate 57 bpm, no significant ST or T-wave changes  Other past medical history Previously reported having diverticulitis, ulcerative colitis. Given prednisone Tolerating anticoagulation  No recent lipid panel available. Lab some 2012 showed total cholesterol 187, LDL 92   echocardiogram done at [facility] 01/17/2012 - Left ventricle: The cavity size was normal. Wall thickness was normal. Systolic function was normal. The estimated   ejection fraction was in the range of 55% to 60%. Wall motion was normal; there were no regional wall motion abnormalities. - Mitral valve: Mild regurgitation.       Allergies  Allergen Reactions  . Latex   . Adhesive [Tape] Rash  . Aspirin Other (See Comments)   Aggravates Acid Reflux  . Prednisone Palpitations    Made pts heart race    Current Outpatient Prescriptions on File Prior to Visit  Medication Sig Dispense Refill  . ALPRAZolam (XANAX) 0.5 MG tablet Take 0.5 mg by mouth at bedtime. For anxiety    . Cetirizine HCl 10 MG CAPS Take by mouth as needed.     . diltiazem (CARDIZEM) 30 MG tablet Take 1 tablet (30 mg total) by mouth 3 (three) times daily as needed (for breakthrough atrial fibrillation). 90 tablet 4  . famotidine (PEPCID) 40 MG tablet Take 40 mg by mouth as needed.    . propranolol (INDERAL) 20 MG tablet Take 1 tablet (20 mg total) by mouth 3 (three) times daily as needed (for breakthrough atrial fibrillation). (Patient taking differently: Take 20 mg by mouth 3 (three) times daily as needed (for breakthrough atrial fibrillation). Pt uses for emergency use only) 90 tablet 4  . Simethicone (GAS-X PO) Take 1 tablet by mouth as needed. For gas     No current facility-administered medications on file prior to visit.    Past Medical History  Diagnosis Date  . Mitral valve disorders     a. 12/2011 Echo: EF 55-60%, NRWMA, No evidence of MVP, Mild MR  . Collagenous colitis   . Osteoporosis   . GERD (gastroesophageal reflux disease)   . IBS (irritable bowel syndrome)   . Internal hemorrhoids 2010    Colon-Dr. Vira Agar   . Schatzki's ring 2010     EGD- Dr. Vira Agar   . Hiatal hernia 2004  . Cataract   .  Hyperlipidemia   . Midsternal chest pain     a. 01/2011 Ex Mv: EF 68%, No ischemia.  . Squamous cell carcinoma of skin     multiple  . Heart murmur   . Atrial fibrillation ([place])     a. diagnosed 12/2011  . Migraine 01/16/12    "none now for several years"  . Arthritis     "fingers; right shoulder"  . Kidney stones   . Broken ribs   . Esophageal candidiasis ([place])   . Ulcerative colitis ([place])   . Diverticulosis     Past Surgical History  Procedure Laterality Date  . Upper gastrointestinal endoscopy  Multiple    w/dilation,  hiatal hernia  . Colonoscopy  Multiple  . Vaginal hysterectomy  1972  . Tonsillectomy and adenoidectomy  1975  . Appendectomy  1963  . Ovarian cyst removal  1990's  . Shoulder arthroscopy w/ rotator cuff repair Right ~ 2005  . Breast cyst aspiration Bilateral     "several; both sides"  . Squamous cell carcinoma excision      "10-12 removed; all over my body - nose, predominately legs"  . Cystoscopy w/ stone manipulation  1980's  . Cardioversion  01/16/2012    Procedure: CARDIOVERSION;  Surgeon: Evans Lance, MD;  Location: [facility] OR;  Service: Cardiovascular;  Laterality: N/A;  . Leg surgery      skin cancer removal    Social History  reports that she quit smoking about 3 years ago. Her smoking use included Cigarettes. She has a 4 pack-year smoking history. She has never used smokeless tobacco. She reports that she does not drink alcohol or use illicit drugs.  Family History family history includes Alzheimer's disease in her mother; Diabetes in her father; Heart disease in her father; Irritable bowel syndrome in her father; Kidney disease in her father. There is no history of Colon cancer.   Review of Systems  Constitutional: Negative.   Respiratory: Positive for chest tightness and shortness of breath.   Cardiovascular: Positive for chest pain.  Gastrointestinal: Negative.   Musculoskeletal: Negative.   Neurological: Negative.   Hematological: Negative.   Psychiatric/Behavioral: Negative.   All other systems reviewed and are negative.   BP 90/62 mmHg  Pulse 57  Ht 5\' 8"  (1.727 m)  Wt 144 lb (65.318 kg)  BMI 21.90 kg/m2  Physical Exam  Constitutional: She is oriented to person, place, and time. She appears well-developed and well-nourished.  HENT:  Head: Normocephalic.  Nose: Nose normal.  Mouth/Throat: Oropharynx is clear and moist.  Eyes: Conjunctivae are normal. Pupils are equal, round, and reactive to light.  Neck: Normal range of motion. Neck supple. No JVD  present.  Cardiovascular: Normal rate, regular rhythm, S1 normal, S2 normal, normal heart sounds and intact distal pulses.  Exam reveals no gallop and no friction rub.   No murmur heard. Pulmonary/Chest: Effort normal and breath sounds normal. No respiratory distress. She has no wheezes. She has no rales. She exhibits no tenderness.  Abdominal: Soft. Bowel sounds are normal. She exhibits no distension. There is no tenderness.  Musculoskeletal: Normal range of motion. She exhibits no edema or tenderness.  Lymphadenopathy:    She has no cervical adenopathy.  Neurological: She is alert and oriented to person, place, and time. Coordination normal.  Skin: Skin is warm and dry. No rash noted. No erythema.  Psychiatric: She has a normal mood and affect. Her behavior is normal. Judgment and thought content normal.    Assessment and  Plan  Nursing note and vitals reviewed.

## 2015-06-10 NOTE — Assessment & Plan Note (Signed)
Etiology of her chest pain is concerning for angina pectoris.  Moderate coronary artery disease in the mid LAD seen on CT scan Chest pain or shortness of breath relieved at rest We have requested a stress Myoview to rule out ischemia

## 2015-06-10 NOTE — Assessment & Plan Note (Signed)
Tolerating anticoagulation. 

## 2015-06-10 NOTE — Assessment & Plan Note (Signed)
She has done well on flecainide. No changes to her medication

## 2015-06-10 NOTE — Assessment & Plan Note (Signed)
We have encouraged her to continue to work on weaning her cigarettes and smoking cessation. She will continue to work on this and does not want any assistance with chantix.  

## 2015-06-20 DIAGNOSIS — M722 Plantar fascial fibromatosis: Secondary | ICD-10-CM | POA: Diagnosis not present

## 2015-06-21 ENCOUNTER — Encounter
Admission: RE | Admit: 2015-06-21 | Discharge: 2015-06-21 | Disposition: A | Discharge status: Home / Self Care | Payer: Medicare Other | Source: Ambulatory Visit | Attending: Cardiovascular Disease | Admitting: Cardiovascular Disease

## 2015-06-21 DIAGNOSIS — R079 Chest pain, unspecified: Secondary | ICD-10-CM

## 2015-06-21 DIAGNOSIS — I25700 Atherosclerosis of coronary artery bypass graft(s), unspecified, with unstable angina pectoris: Secondary | ICD-10-CM | POA: Diagnosis not present

## 2015-06-21 DIAGNOSIS — R0602 Shortness of breath: Secondary | ICD-10-CM | POA: Insufficient documentation

## 2015-06-21 DIAGNOSIS — I4891 Unspecified atrial fibrillation: Secondary | ICD-10-CM | POA: Diagnosis not present

## 2015-06-21 LAB — NM MYOCAR MULTI W/SPECT W/WALL MOTION / EF
CHL CUP NUCLEAR SDS: 2
CHL CUP RESTING HR STRESS: 66 {beats}/min
CHL CUP STRESS STAGE 2 HR: 71 {beats}/min
CHL CUP STRESS STAGE 4 GRADE: 0 %
CHL CUP STRESS STAGE 4 HR: 66 {beats}/min
CHL CUP STRESS STAGE 4 SPEED: 0.1 mph
CHL CUP STRESS STAGE 5 DBP: 57 mmHg
CHL CUP STRESS STAGE 5 GRADE: 10 %
CHL CUP STRESS STAGE 5 SBP: 145 mmHg
CHL CUP STRESS STAGE 6 DBP: 61 mmHg
CHL CUP STRESS STAGE 6 HR: 133 {beats}/min
CHL CUP STRESS STAGE 6 SBP: 164 mmHg
CHL CUP STRESS STAGE 8 GRADE: 0 %
CHL CUP STRESS STAGE 8 HR: 80 {beats}/min
CHL CUP STRESS STAGE 8 SPEED: 0 mph
CSEPPMHR: 88 %
Estimated workload: 7 METS
Exercise duration (min): 6 min
LV dias vol: 59 mL
LVSYSVOL: 17 mL
Peak BP: 164 mmHg
Peak HR: 133 {beats}/min
Percent HR: 88 %
SRS: 0
SSS: 2
Stage 1 HR: 72 {beats}/min
Stage 2 Grade: 0 %
Stage 2 Speed: 0 mph
Stage 3 Grade: 0 %
Stage 3 HR: 67 {beats}/min
Stage 3 Speed: 0.1 mph
Stage 5 HR: 111 {beats}/min
Stage 5 Speed: 1.7 mph
Stage 6 Grade: 12 %
Stage 6 Speed: 2.5 mph
Stage 7 Grade: 0 %
Stage 7 HR: 114 {beats}/min
Stage 7 Speed: 0 mph
Stage 8 DBP: 65 mmHg
Stage 8 SBP: 159 mmHg
TID: 0.97

## 2015-06-21 MED ORDER — TECHNETIUM TC 99M SESTAMIBI - CARDIOLITE
32.1000 | Freq: Once | INTRAVENOUS | Status: AC | PRN
  Administered 2015-06-21: 32.1 via INTRAVENOUS

## 2015-06-21 MED ORDER — TECHNETIUM TC 99M SESTAMIBI - CARDIOLITE
13.7000 | Freq: Once | INTRAVENOUS | Status: AC | PRN
  Administered 2015-06-21: 08:00:00 13.7 via INTRAVENOUS

## 2015-06-23 DIAGNOSIS — D1800 Hemangioma unspecified site: Secondary | ICD-10-CM | POA: Diagnosis not present

## 2015-06-23 DIAGNOSIS — Z1283 Encounter for screening for malignant neoplasm of skin: Secondary | ICD-10-CM | POA: Diagnosis not present

## 2015-06-23 DIAGNOSIS — L570 Actinic keratosis: Secondary | ICD-10-CM | POA: Diagnosis not present

## 2015-06-23 DIAGNOSIS — L853 Xerosis cutis: Secondary | ICD-10-CM | POA: Diagnosis not present

## 2015-06-23 DIAGNOSIS — L820 Inflamed seborrheic keratosis: Secondary | ICD-10-CM | POA: Diagnosis not present

## 2015-06-23 DIAGNOSIS — D485 Neoplasm of uncertain behavior of skin: Secondary | ICD-10-CM | POA: Diagnosis not present

## 2015-06-23 DIAGNOSIS — Z85828 Personal history of other malignant neoplasm of skin: Secondary | ICD-10-CM | POA: Diagnosis not present

## 2015-06-23 DIAGNOSIS — D229 Melanocytic nevi, unspecified: Secondary | ICD-10-CM | POA: Diagnosis not present

## 2015-06-23 DIAGNOSIS — L578 Other skin changes due to chronic exposure to nonionizing radiation: Secondary | ICD-10-CM | POA: Diagnosis not present

## 2015-06-23 DIAGNOSIS — L821 Other seborrheic keratosis: Secondary | ICD-10-CM | POA: Diagnosis not present

## 2015-06-27 ENCOUNTER — Ambulatory Visit
Admission: RE | Admit: 2015-06-27 | Discharge: 2015-06-27 | Disposition: A | Discharge status: Home / Self Care | Payer: Medicare Other | Source: Ambulatory Visit

## 2015-06-27 DIAGNOSIS — Z1231 Encounter for screening mammogram for malignant neoplasm of breast: Secondary | ICD-10-CM

## 2015-07-20 DIAGNOSIS — M722 Plantar fascial fibromatosis: Secondary | ICD-10-CM | POA: Diagnosis not present

## 2015-07-20 DIAGNOSIS — B351 Tinea unguium: Secondary | ICD-10-CM | POA: Diagnosis not present

## 2015-07-20 DIAGNOSIS — M12571 Traumatic arthropathy, right ankle and foot: Secondary | ICD-10-CM | POA: Diagnosis not present

## 2015-07-24 DIAGNOSIS — M25571 Pain in right ankle and joints of right foot: Secondary | ICD-10-CM | POA: Diagnosis not present

## 2015-08-09 DIAGNOSIS — M25571 Pain in right ankle and joints of right foot: Secondary | ICD-10-CM | POA: Diagnosis not present

## 2015-08-11 DIAGNOSIS — M25571 Pain in right ankle and joints of right foot: Secondary | ICD-10-CM | POA: Diagnosis not present

## 2015-08-15 DIAGNOSIS — M25571 Pain in right ankle and joints of right foot: Secondary | ICD-10-CM | POA: Diagnosis not present

## 2015-08-18 DIAGNOSIS — M25571 Pain in right ankle and joints of right foot: Secondary | ICD-10-CM | POA: Diagnosis not present

## 2015-08-23 DIAGNOSIS — M25571 Pain in right ankle and joints of right foot: Secondary | ICD-10-CM | POA: Diagnosis not present

## 2015-08-24 DIAGNOSIS — M25571 Pain in right ankle and joints of right foot: Secondary | ICD-10-CM | POA: Diagnosis not present

## 2015-08-30 DIAGNOSIS — M25571 Pain in right ankle and joints of right foot: Secondary | ICD-10-CM | POA: Diagnosis not present

## 2015-09-01 DIAGNOSIS — M25571 Pain in right ankle and joints of right foot: Secondary | ICD-10-CM | POA: Diagnosis not present

## 2015-09-05 DIAGNOSIS — L820 Inflamed seborrheic keratosis: Secondary | ICD-10-CM | POA: Diagnosis not present

## 2015-09-05 DIAGNOSIS — Z85828 Personal history of other malignant neoplasm of skin: Secondary | ICD-10-CM | POA: Diagnosis not present

## 2015-09-05 DIAGNOSIS — C44629 Squamous cell carcinoma of skin of left upper limb, including shoulder: Secondary | ICD-10-CM | POA: Diagnosis not present

## 2015-09-05 DIAGNOSIS — L821 Other seborrheic keratosis: Secondary | ICD-10-CM | POA: Diagnosis not present

## 2015-09-05 DIAGNOSIS — L570 Actinic keratosis: Secondary | ICD-10-CM | POA: Diagnosis not present

## 2015-09-05 DIAGNOSIS — D485 Neoplasm of uncertain behavior of skin: Secondary | ICD-10-CM | POA: Diagnosis not present

## 2015-09-05 DIAGNOSIS — L578 Other skin changes due to chronic exposure to nonionizing radiation: Secondary | ICD-10-CM | POA: Diagnosis not present

## 2015-09-07 DIAGNOSIS — M25571 Pain in right ankle and joints of right foot: Secondary | ICD-10-CM | POA: Diagnosis not present

## 2015-09-09 DIAGNOSIS — M25571 Pain in right ankle and joints of right foot: Secondary | ICD-10-CM | POA: Diagnosis not present

## 2015-09-13 DIAGNOSIS — M25571 Pain in right ankle and joints of right foot: Secondary | ICD-10-CM | POA: Diagnosis not present

## 2015-09-15 DIAGNOSIS — M25571 Pain in right ankle and joints of right foot: Secondary | ICD-10-CM | POA: Diagnosis not present

## 2015-09-20 DIAGNOSIS — M25571 Pain in right ankle and joints of right foot: Secondary | ICD-10-CM | POA: Diagnosis not present

## 2015-09-21 DIAGNOSIS — M25571 Pain in right ankle and joints of right foot: Secondary | ICD-10-CM | POA: Diagnosis not present

## 2015-09-22 DIAGNOSIS — M25571 Pain in right ankle and joints of right foot: Secondary | ICD-10-CM | POA: Diagnosis not present

## 2015-09-27 DIAGNOSIS — M25571 Pain in right ankle and joints of right foot: Secondary | ICD-10-CM | POA: Diagnosis not present

## 2015-09-29 DIAGNOSIS — M25571 Pain in right ankle and joints of right foot: Secondary | ICD-10-CM | POA: Diagnosis not present

## 2015-10-04 DIAGNOSIS — M25571 Pain in right ankle and joints of right foot: Secondary | ICD-10-CM | POA: Diagnosis not present

## 2015-10-05 DIAGNOSIS — M722 Plantar fascial fibromatosis: Secondary | ICD-10-CM | POA: Diagnosis not present

## 2015-10-05 DIAGNOSIS — M12571 Traumatic arthropathy, right ankle and foot: Secondary | ICD-10-CM | POA: Diagnosis not present

## 2015-10-07 DIAGNOSIS — M25571 Pain in right ankle and joints of right foot: Secondary | ICD-10-CM | POA: Diagnosis not present

## 2015-10-13 DIAGNOSIS — L820 Inflamed seborrheic keratosis: Secondary | ICD-10-CM | POA: Diagnosis not present

## 2015-10-13 DIAGNOSIS — S8402XA Injury of tibial nerve at lower leg level, left leg, initial encounter: Secondary | ICD-10-CM | POA: Diagnosis not present

## 2015-10-13 DIAGNOSIS — L0291 Cutaneous abscess, unspecified: Secondary | ICD-10-CM | POA: Diagnosis not present

## 2015-11-01 DIAGNOSIS — F5102 Adjustment insomnia: Secondary | ICD-10-CM | POA: Diagnosis not present

## 2015-11-01 DIAGNOSIS — L03116 Cellulitis of left lower limb: Secondary | ICD-10-CM | POA: Diagnosis not present

## 2015-11-01 DIAGNOSIS — R001 Bradycardia, unspecified: Secondary | ICD-10-CM | POA: Diagnosis not present

## 2015-11-07 DIAGNOSIS — Z886 Allergy status to analgesic agent status: Secondary | ICD-10-CM

## 2015-11-07 DIAGNOSIS — Z820 Family history of epilepsy and other diseases of the nervous system: Secondary | ICD-10-CM

## 2015-11-07 DIAGNOSIS — L03116 Cellulitis of left lower limb: Principal | ICD-10-CM | POA: Diagnosis present

## 2015-11-07 DIAGNOSIS — Z87891 Personal history of nicotine dependence: Secondary | ICD-10-CM

## 2015-11-07 DIAGNOSIS — E785 Hyperlipidemia, unspecified: Secondary | ICD-10-CM | POA: Diagnosis present

## 2015-11-07 DIAGNOSIS — Z7901 Long term (current) use of anticoagulants: Secondary | ICD-10-CM

## 2015-11-07 DIAGNOSIS — Z888 Allergy status to other drugs, medicaments and biological substances status: Secondary | ICD-10-CM

## 2015-11-07 DIAGNOSIS — I482 Chronic atrial fibrillation: Secondary | ICD-10-CM | POA: Diagnosis present

## 2015-11-07 DIAGNOSIS — Z85828 Personal history of other malignant neoplasm of skin: Secondary | ICD-10-CM

## 2015-11-07 DIAGNOSIS — Z23 Encounter for immunization: Secondary | ICD-10-CM

## 2015-11-07 DIAGNOSIS — Z833 Family history of diabetes mellitus: Secondary | ICD-10-CM

## 2015-11-07 DIAGNOSIS — Z79899 Other long term (current) drug therapy: Secondary | ICD-10-CM

## 2015-11-07 DIAGNOSIS — H269 Unspecified cataract: Secondary | ICD-10-CM | POA: Diagnosis present

## 2015-11-07 DIAGNOSIS — K219 Gastro-esophageal reflux disease without esophagitis: Secondary | ICD-10-CM | POA: Diagnosis present

## 2015-11-07 DIAGNOSIS — R739 Hyperglycemia, unspecified: Secondary | ICD-10-CM | POA: Diagnosis present

## 2015-11-07 DIAGNOSIS — Z91040 Latex allergy status: Secondary | ICD-10-CM

## 2015-11-07 DIAGNOSIS — G43909 Migraine, unspecified, not intractable, without status migrainosus: Secondary | ICD-10-CM | POA: Diagnosis present

## 2015-11-07 DIAGNOSIS — Z841 Family history of disorders of kidney and ureter: Secondary | ICD-10-CM

## 2015-11-07 DIAGNOSIS — Z8249 Family history of ischemic heart disease and other diseases of the circulatory system: Secondary | ICD-10-CM

## 2015-11-07 DIAGNOSIS — M810 Age-related osteoporosis without current pathological fracture: Secondary | ICD-10-CM | POA: Diagnosis present

## 2015-11-07 DIAGNOSIS — K589 Irritable bowel syndrome without diarrhea: Secondary | ICD-10-CM | POA: Diagnosis present

## 2015-11-07 DIAGNOSIS — N289 Disorder of kidney and ureter, unspecified: Secondary | ICD-10-CM | POA: Diagnosis present

## 2015-11-07 LAB — CBC
HEMATOCRIT: 37.3 % (ref 35.0–47.0)
Hemoglobin: 12.7 g/dL (ref 12.0–16.0)
MCH: 29.9 pg (ref 26.0–34.0)
MCHC: 34 g/dL (ref 32.0–36.0)
MCV: 88.1 fL (ref 80.0–100.0)
Platelets: 176 10*3/uL (ref 150–440)
RBC: 4.23 MIL/uL (ref 3.80–5.20)
RDW: 13.5 % (ref 11.5–14.5)
WBC: 8 10*3/uL (ref 3.6–11.0)

## 2015-11-07 LAB — BASIC METABOLIC PANEL
ANION GAP: 5 (ref 5–15)
BUN: 22 mg/dL — ABNORMAL HIGH (ref 6–20)
CALCIUM: 9.2 mg/dL (ref 8.9–10.3)
CO2: 26 mmol/L (ref 22–32)
Chloride: 104 mmol/L (ref 101–111)
Creatinine, Ser: 1.12 mg/dL — ABNORMAL HIGH (ref 0.44–1.00)
GFR calc Af Amer: 56 mL/min — ABNORMAL LOW (ref >60)
GFR calc non Af Amer: 49 mL/min — ABNORMAL LOW (ref >60)
Glucose, Bld: 108 mg/dL — ABNORMAL HIGH (ref 65–99)
Potassium: 4 mmol/L (ref 3.5–5.1)
Sodium: 135 mmol/L (ref 135–145)

## 2015-11-07 NOTE — ED Notes (Signed)
Patient ambulatory to triage with steady gait, without difficulty or distress noted; pt reports 6wks ago dropped something on her left lower leg and had subsequent bruising/swelling; now with persistent scabbed area and redness; seen Dr Nehemiah Massed, dermatologist and rx mupirocin ointment and antibiotics; f/u last Thursday and Dr Humphrey Rolls and told cellulitis and rx bactrim but symptoms persists

## 2015-11-08 ENCOUNTER — Encounter: Payer: Self-pay | Admitting: Internal Medicine

## 2015-11-08 ENCOUNTER — Inpatient Hospital Stay
Admission: EM | Admit: 2015-11-08 | Discharge: 2015-11-09 | DRG: 603 | Disposition: A | Discharge status: Home / Self Care | Payer: Medicare Other | Attending: Internal Medicine | Admitting: Internal Medicine

## 2015-11-08 DIAGNOSIS — M810 Age-related osteoporosis without current pathological fracture: Secondary | ICD-10-CM | POA: Diagnosis present

## 2015-11-08 DIAGNOSIS — R739 Hyperglycemia, unspecified: Secondary | ICD-10-CM | POA: Diagnosis not present

## 2015-11-08 DIAGNOSIS — E785 Hyperlipidemia, unspecified: Secondary | ICD-10-CM | POA: Diagnosis not present

## 2015-11-08 DIAGNOSIS — Z7901 Long term (current) use of anticoagulants: Secondary | ICD-10-CM | POA: Diagnosis not present

## 2015-11-08 DIAGNOSIS — Z833 Family history of diabetes mellitus: Secondary | ICD-10-CM | POA: Diagnosis not present

## 2015-11-08 DIAGNOSIS — L03116 Cellulitis of left lower limb: Secondary | ICD-10-CM

## 2015-11-08 DIAGNOSIS — Z888 Allergy status to other drugs, medicaments and biological substances status: Secondary | ICD-10-CM | POA: Diagnosis not present

## 2015-11-08 DIAGNOSIS — Z820 Family history of epilepsy and other diseases of the nervous system: Secondary | ICD-10-CM | POA: Diagnosis not present

## 2015-11-08 DIAGNOSIS — Z23 Encounter for immunization: Secondary | ICD-10-CM | POA: Diagnosis not present

## 2015-11-08 DIAGNOSIS — I10 Essential (primary) hypertension: Secondary | ICD-10-CM | POA: Diagnosis not present

## 2015-11-08 DIAGNOSIS — Z91040 Latex allergy status: Secondary | ICD-10-CM | POA: Diagnosis not present

## 2015-11-08 DIAGNOSIS — Z85828 Personal history of other malignant neoplasm of skin: Secondary | ICD-10-CM | POA: Diagnosis not present

## 2015-11-08 DIAGNOSIS — Z87891 Personal history of nicotine dependence: Secondary | ICD-10-CM | POA: Diagnosis not present

## 2015-11-08 DIAGNOSIS — H269 Unspecified cataract: Secondary | ICD-10-CM | POA: Diagnosis present

## 2015-11-08 DIAGNOSIS — G43909 Migraine, unspecified, not intractable, without status migrainosus: Secondary | ICD-10-CM | POA: Diagnosis present

## 2015-11-08 DIAGNOSIS — I482 Chronic atrial fibrillation: Secondary | ICD-10-CM | POA: Diagnosis not present

## 2015-11-08 DIAGNOSIS — Z8249 Family history of ischemic heart disease and other diseases of the circulatory system: Secondary | ICD-10-CM | POA: Diagnosis not present

## 2015-11-08 DIAGNOSIS — Z841 Family history of disorders of kidney and ureter: Secondary | ICD-10-CM | POA: Diagnosis not present

## 2015-11-08 DIAGNOSIS — Z886 Allergy status to analgesic agent status: Secondary | ICD-10-CM | POA: Diagnosis not present

## 2015-11-08 DIAGNOSIS — K589 Irritable bowel syndrome without diarrhea: Secondary | ICD-10-CM | POA: Diagnosis present

## 2015-11-08 DIAGNOSIS — Z79899 Other long term (current) drug therapy: Secondary | ICD-10-CM | POA: Diagnosis not present

## 2015-11-08 DIAGNOSIS — L0390 Cellulitis, unspecified: Secondary | ICD-10-CM | POA: Diagnosis present

## 2015-11-08 DIAGNOSIS — K219 Gastro-esophageal reflux disease without esophagitis: Secondary | ICD-10-CM | POA: Diagnosis not present

## 2015-11-08 DIAGNOSIS — N289 Disorder of kidney and ureter, unspecified: Secondary | ICD-10-CM | POA: Diagnosis not present

## 2015-11-08 LAB — BASIC METABOLIC PANEL
ANION GAP: 5 (ref 5–15)
BUN: 20 mg/dL (ref 6–20)
CO2: 26 mmol/L (ref 22–32)
Calcium: 9 mg/dL (ref 8.9–10.3)
Chloride: 106 mmol/L (ref 101–111)
Creatinine, Ser: 0.84 mg/dL (ref 0.44–1.00)
GFR calc Af Amer: >60 mL/min (ref >60)
Glucose, Bld: 90 mg/dL (ref 65–99)
POTASSIUM: 4.6 mmol/L (ref 3.5–5.1)
SODIUM: 137 mmol/L (ref 135–145)

## 2015-11-08 LAB — CBC
HCT: 37.9 % (ref 35.0–47.0)
Hemoglobin: 12.9 g/dL (ref 12.0–16.0)
MCH: 30.6 pg (ref 26.0–34.0)
MCHC: 34 g/dL (ref 32.0–36.0)
MCV: 90 fL (ref 80.0–100.0)
PLATELETS: 173 10*3/uL (ref 150–440)
RBC: 4.21 MIL/uL (ref 3.80–5.20)
RDW: 13.6 % (ref 11.5–14.5)
WBC: 6.7 10*3/uL (ref 3.6–11.0)

## 2015-11-08 LAB — HEMOGLOBIN A1C: HEMOGLOBIN A1C: 5.3 % (ref 4.0–6.0)

## 2015-11-08 MED ORDER — VANCOMYCIN HCL IN DEXTROSE 1-5 GM/200ML-% IV SOLN
1000.0000 mg | Freq: Once | INTRAVENOUS | Status: AC
  Administered 2015-11-08: 1000 mg via INTRAVENOUS
  Filled 2015-11-08: qty 200

## 2015-11-08 MED ORDER — FLECAINIDE ACETATE 50 MG PO TABS
50.0000 mg | ORAL_TABLET | Freq: Two times a day (BID) | ORAL | Status: DC
  Administered 2015-11-08 – 2015-11-09 (×3): 50 mg via ORAL
  Filled 2015-11-08 (×4): qty 1

## 2015-11-08 MED ORDER — ONDANSETRON HCL 4 MG PO TABS: 4.0000 mg | ORAL_TABLET | Freq: Four times a day (QID) | ORAL | Status: DC | PRN

## 2015-11-08 MED ORDER — SODIUM CHLORIDE 0.9% FLUSH: 3.0000 mL | INTRAVENOUS | Status: DC | PRN

## 2015-11-08 MED ORDER — RIVAROXABAN 20 MG PO TABS
20.0000 mg | ORAL_TABLET | Freq: Every day | ORAL | Status: DC
  Administered 2015-11-09: 20 mg via ORAL
  Filled 2015-11-08: qty 1

## 2015-11-08 MED ORDER — ALPRAZOLAM 0.5 MG PO TABS
0.5000 mg | ORAL_TABLET | Freq: Every day | ORAL | Status: DC
  Administered 2015-11-08: 0.5 mg via ORAL
  Filled 2015-11-08: qty 1

## 2015-11-08 MED ORDER — ACETAMINOPHEN 325 MG PO TABS: 650.0000 mg | ORAL_TABLET | Freq: Four times a day (QID) | ORAL | Status: DC | PRN

## 2015-11-08 MED ORDER — SENNOSIDES-DOCUSATE SODIUM 8.6-50 MG PO TABS: 1.0000 | ORAL_TABLET | Freq: Every evening | ORAL | Status: DC | PRN

## 2015-11-08 MED ORDER — DILTIAZEM HCL 30 MG PO TABS: 30.0000 mg | ORAL_TABLET | Freq: Three times a day (TID) | ORAL | Status: DC | PRN

## 2015-11-08 MED ORDER — HYDROCODONE-ACETAMINOPHEN 5-325 MG PO TABS: 1.0000 | ORAL_TABLET | ORAL | Status: DC | PRN

## 2015-11-08 MED ORDER — RIVAROXABAN 20 MG PO TABS: 20.0000 mg | ORAL_TABLET | Freq: Every day | ORAL | Status: DC

## 2015-11-08 MED ORDER — METOPROLOL SUCCINATE ER 25 MG PO TB24
25.0000 mg | ORAL_TABLET | Freq: Every day | ORAL | Status: DC
  Administered 2015-11-09: 12.5 mg via ORAL
  Filled 2015-11-08 (×2): qty 1

## 2015-11-08 MED ORDER — CLINDAMYCIN PHOSPHATE 600 MG/50ML IV SOLN
600.0000 mg | Freq: Three times a day (TID) | INTRAVENOUS | Status: DC
  Administered 2015-11-08 – 2015-11-09 (×3): 600 mg via INTRAVENOUS
  Filled 2015-11-08 (×6): qty 50

## 2015-11-08 MED ORDER — SODIUM CHLORIDE 0.9% FLUSH
3.0000 mL | Freq: Two times a day (BID) | INTRAVENOUS | Status: DC
  Administered 2015-11-08 – 2015-11-09 (×3): 3 mL via INTRAVENOUS

## 2015-11-08 MED ORDER — ROSUVASTATIN CALCIUM 5 MG PO TABS
5.0000 mg | ORAL_TABLET | Freq: Every day | ORAL | Status: DC
Start: 2015-11-08 — End: 2015-11-09
  Administered 2015-11-08 – 2015-11-09 (×2): 5 mg via ORAL
  Filled 2015-11-08 (×2): qty 1

## 2015-11-08 MED ORDER — RIVAROXABAN 15 MG PO TABS
15.0000 mg | ORAL_TABLET | Freq: Every day | ORAL | Status: DC
  Administered 2015-11-08: 15 mg via ORAL
  Filled 2015-11-08: qty 1

## 2015-11-08 MED ORDER — METOPROLOL SUCCINATE ER 25 MG PO TB24
25.0000 mg | ORAL_TABLET | Freq: Every day | ORAL | Status: DC
  Filled 2015-11-08: qty 1

## 2015-11-08 MED ORDER — ONDANSETRON HCL 4 MG/2ML IJ SOLN: 4.0000 mg | Freq: Four times a day (QID) | INTRAMUSCULAR | Status: DC | PRN

## 2015-11-08 MED ORDER — SIMETHICONE 80 MG PO CHEW
80.0000 mg | CHEWABLE_TABLET | Freq: Four times a day (QID) | ORAL | Status: DC | PRN
  Filled 2015-11-08: qty 1

## 2015-11-08 MED ORDER — SODIUM CHLORIDE 0.9 % IV SOLN: 250.0000 mL | INTRAVENOUS | Status: DC | PRN

## 2015-11-08 MED ORDER — ACETAMINOPHEN 650 MG RE SUPP: 650.0000 mg | Freq: Four times a day (QID) | RECTAL | Status: DC | PRN

## 2015-11-08 NOTE — ED Provider Notes (Signed)
[facility] Emergency Department Provider Note  ____________________________________________  Time seen: Approximately 248 AM  I have reviewed the triage vital signs and the nursing notes.   HISTORY  Chief Complaint Leg Injury    HPI [name] is a 70 y.o. female who comes into the hospital today with some redness to her left lower extremity. The patient reports that 6 weeks ago she was getting something out of the freezerin her garage when a box hit her leg. She reports that it initially did not break the skin but she had a bruise. The patient is on Xarelto so she is used to having bruises. She reports she put ice on it and use bruise medication. After about 2 weeks the area split open and there was oozing. The patient went to see her dermatologist that day and was placed on some antibiotic for an infection. The patient was also placed on mupirocin. She reports that she kept putting bandages on it until it developed a scab and then she left it open. She reports that she followed back up with her primary care physician last Thursday and there was still some redness around it so she was placed on Bactrim. She reports that the area was doing well up until yesterday when it became puffy and red. Initially the patient did not think much of it but the redness started to spread down her leg. The patient reports that she has a neighbor who is a Marine scientist and they were concerned she may have some cellulitis so they informed her to come into the hospital. The patient has had no fevers, vomiting, chest pain, shortness of breath. She reports that she does have some stinging pain in the area but it's only present when someone touches it   Past Medical History  Diagnosis Date  . Mitral valve disorders     a. 12/2011 Echo: EF 55-60%, NRWMA, No evidence of MVP, Mild MR  . Collagenous colitis   . Osteoporosis   . GERD (gastroesophageal reflux disease)   . IBS (irritable bowel  syndrome)   . Internal hemorrhoids 2010    Colon-Dr. Vira Agar   . Schatzki's ring 2010     EGD- Dr. Vira Agar   . Hiatal hernia 2004  . Cataract   . Hyperlipidemia   . Midsternal chest pain     a. 01/2011 Ex Mv: EF 68%, No ischemia.  . Squamous cell carcinoma of skin     multiple  . Heart murmur   . Atrial fibrillation ([place])     a. diagnosed 12/2011  . Migraine 01/16/12    "none now for several years"  . Arthritis     "fingers; right shoulder"  . Kidney stones   . Broken ribs   . Esophageal candidiasis ([place])   . Ulcerative colitis ([place])   . Diverticulosis     Patient Active Problem List   Diagnosis Date Noted  . Cellulitis 11/08/2015  . Long term (current) use of anticoagulants - Xarelto for A fib 02/22/2014  . Anxiety 02/25/2012  . Smoking 01/23/2012  . Atrial fibrillation with rapid ventricular response ([place]) 01/16/2012  . Chest pain 02/08/2011  . GERD with stricture 12/29/2008  . IRRITABLE BOWEL SYNDROME 12/29/2008  . OSTEOPOROSIS 12/29/2008  . Tachycardia 12/29/2008  . PALPITATIONS 12/29/2008  . Collagenous colitis 10/08/2007    Past Surgical History  Procedure Laterality Date  . Upper gastrointestinal endoscopy  Multiple    w/dilation, hiatal hernia  . Colonoscopy  Multiple  .  Vaginal hysterectomy  1972  . Tonsillectomy and adenoidectomy  1975  . Appendectomy  1963  . Ovarian cyst removal  1990's  . Shoulder arthroscopy w/ rotator cuff repair Right ~ 2005  . Breast cyst aspiration Bilateral     "several; both sides"  . Squamous cell carcinoma excision      "10-12 removed; all over my body - nose, predominately legs"  . Cystoscopy w/ stone manipulation  1980's  . Cardioversion  01/16/2012    Procedure: CARDIOVERSION;  Surgeon: Evans Lance, MD;  Location: [facility] OR;  Service: Cardiovascular;  Laterality: N/A;  . Leg surgery      skin cancer removal    No current outpatient prescriptions on file.  Allergies Latex; Adhesive; Aspirin; and Prednisone  Family  History  Problem Relation Age of Onset  . Colon cancer Neg Hx   . Heart disease Father     A Fib and CHF - died in Mid 48  . Diabetes Father   . Kidney disease Father   . Irritable bowel syndrome Father   . Alzheimer's disease Mother     Died in [place] 63's.    Social History Social History  Substance Use Topics  . Smoking status: Former Smoker -- 0.10 packs/day for 40 years    Types: Cigarettes    Quit date: 01/16/2012  . Smokeless tobacco: Never Used  . Alcohol Use: No     Comment: rare    Review of Systems Constitutional: No fever/chills Eyes: No visual changes. ENT: No sore throat. Cardiovascular: Denies chest pain. Respiratory: Denies shortness of breath. Gastrointestinal: No abdominal pain.  No nausea, no vomiting.  No diarrhea.  No constipation. Genitourinary: Negative for dysuria. Musculoskeletal: Negative for back pain. Skin: Redness to left leg Neurological: Negative for headaches, focal weakness or numbness.  10-point ROS otherwise negative.  ____________________________________________   PHYSICAL EXAM:  VITAL SIGNS: ED Triage Vitals  Enc Vitals Group     BP 11/07/15 2308 137/61 mmHg     Pulse Rate 11/07/15 2308 62     Resp 11/07/15 2308 16     Temp 11/07/15 2308 98.4 F (36.9 C)     Temp Source 11/07/15 2308 Oral     SpO2 11/07/15 2308 100 %     Weight 11/07/15 2308 141 lb (63.957 kg)     Height 11/07/15 2308 5\' 8"  (1.727 m)     Head Cir --      Peak Flow --      Pain Score 11/07/15 2315 6     Pain Loc --      Pain Edu? --      Excl. in [place]? --     Constitutional: Alert and oriented. Well appearing and inMild distress. Eyes: Conjunctivae are normal. PERRL. EOMI. Head: Atraumatic. Nose: No congestion/rhinnorhea. Mouth/Throat: Mucous membranes are moist.  Oropharynx non-erythematous. Cardiovascular: Normal rate, regular rhythm. Grossly normal heart sounds.  Good peripheral circulation. Respiratory: Normal respiratory effort.  No retractions.  Lungs CTAB. Gastrointestinal: Soft and nontender. No distention. Positive bowel sounds Musculoskeletal: No lower extremity tenderness nor edema.   Neurologic:  Normal speech and language.  Skin:  Redness to left leg with approximately 1.5 x 1.5 cm scab. No purulence, no abscess. Psychiatric: Mood and affect are normal.   ____________________________________________   LABS (all labs ordered are listed, but only abnormal results are displayed)  Labs Reviewed  BASIC METABOLIC PANEL - Abnormal; Notable for the following:    Glucose, Bld 108 (*)  BUN 22 (*)    Creatinine, Ser 1.12 (*)    GFR calc non Af Amer 49 (*)    GFR calc Af Amer 56 (*)    All other components within normal limits  CULTURE, BLOOD (SINGLE)  CULTURE, BLOOD (ROUTINE X 2) W REFLEX TO ID PANEL  CBC  CBC  BASIC METABOLIC PANEL   ____________________________________________  EKG  none ____________________________________________  RADIOLOGY  none ____________________________________________   PROCEDURES  Procedure(s) performed: None  Critical Care performed: No  ____________________________________________   INITIAL IMPRESSION / ASSESSMENT AND PLAN / ED COURSE  Pertinent labs & imaging results that were available during my care of the patient were reviewed by me and considered in my medical decision making (see chart for details).  This is a 70 year old female who comes into the hospital today with some redness with left flexor 20. The patient is been on 2 different antibiotics and continues to have this redness. She is also having some pain and stinging to the area. The patient is concerned as she is traveling soon and does not want to end up in a hospital in another location. I will give the patient a dose of vancomycin and I will admit her to the hospital for cellulitis not responsive to oral antibiotics. The patient has no further questions or concerns she understands the plan and agrees with the  plan as stated. The patient be admitted and I spoke to the hospitalist who accepted the patient to their service. ____________________________________________   FINAL CLINICAL IMPRESSION(S) / ED DIAGNOSES  Final diagnoses:  Cellulitis of left lower extremity      Loney Hering, MD 11/08/15 [phone]

## 2015-11-08 NOTE — H&P (Signed)
[place] [facility] [place] NAME: [name]    MR#:  [id number]  DATE OF BIRTH:  [birth year]  DATE OF ADMISSION:  11/08/2015  PRIMARY CARE PHYSICIAN: [name], MD   REQUESTING/REFERRING PHYSICIAN:   CHIEF COMPLAINT:   Chief Complaint  Patient presents with  . Leg Injury    HISTORY OF PRESENT ILLNESS: [name]  is a 70 y.o. female with a known history of gastroesophageal reflux disorder, osteoporosis, hiatal hernia, hyperlipidemia, atrial fibrillation, squamous cell carcinoma of skin presented to the emergency room with the redness in the left leg. Patient also has some burning sensation in the left leg. It started 6 weeks ago with a small injury to the left leg which later on became red and indurated. Patient tried oral outpatient antibiotics she completed a course of doxycycline and Bactrim for 14 days. She went to get a physical exam done by Dr. Chancy Milroy at the primary care clinic where after examination she was told to have cellulitis of the left leg. Patient has redness and tenderness in the left lower extremity. She also had a dry scab over the anterior shin area. No history of any fever or chills. No history of chest pain. No history of shortness of breath. Hospitalist was was consulted for further care.  PAST MEDICAL HISTORY:   Past Medical History  Diagnosis Date  . Mitral valve disorders     a. 12/2011 Echo: EF 55-60%, NRWMA, No evidence of MVP, Mild MR  . Collagenous colitis   . Osteoporosis   . GERD (gastroesophageal reflux disease)   . IBS (irritable bowel syndrome)   . Internal hemorrhoids 2010    Colon-Dr. Vira Agar   . Schatzki's ring 2010     EGD- Dr. Vira Agar   . Hiatal hernia 2004  . Cataract   . Hyperlipidemia   . Midsternal chest pain     a. 01/2011 Ex Mv: EF 68%, No ischemia.  . Squamous cell carcinoma of skin     multiple  . Heart murmur   . Atrial fibrillation ([place])     a. diagnosed 12/2011  . Migraine 01/16/12     "none now for several years"  . Arthritis     "fingers; right shoulder"  . Kidney stones   . Broken ribs   . Esophageal candidiasis ([place])   . Ulcerative colitis ([place])   . Diverticulosis     PAST SURGICAL HISTORY: Past Surgical History  Procedure Laterality Date  . Upper gastrointestinal endoscopy  Multiple    w/dilation, hiatal hernia  . Colonoscopy  Multiple  . Vaginal hysterectomy  1972  . Tonsillectomy and adenoidectomy  1975  . Appendectomy  1963  . Ovarian cyst removal  1990's  . Shoulder arthroscopy w/ rotator cuff repair Right ~ 2005  . Breast cyst aspiration Bilateral     "several; both sides"  . Squamous cell carcinoma excision      "10-12 removed; all over my body - nose, predominately legs"  . Cystoscopy w/ stone manipulation  1980's  . Cardioversion  01/16/2012    Procedure: CARDIOVERSION;  Surgeon: Evans Lance, MD;  Location: [facility] OR;  Service: Cardiovascular;  Laterality: N/A;  . Leg surgery      skin cancer removal    SOCIAL HISTORY:  Social History  Substance Use Topics  . Smoking status: Former Smoker -- 0.10 packs/day for 40 years    Types: Cigarettes    Quit date: 01/16/2012  .  Smokeless tobacco: Never Used  . Alcohol Use: No     Comment: rare    FAMILY HISTORY:  Family History  Problem Relation Age of Onset  . Colon cancer Neg Hx   . Heart disease Father     A Fib and CHF - died in Mid 74  . Diabetes Father   . Kidney disease Father   . Irritable bowel syndrome Father   . Alzheimer's disease Mother     Died in [place] 56's.    DRUG ALLERGIES:  Allergies  Allergen Reactions  . Latex   . Adhesive [Tape] Rash  . Aspirin Other (See Comments)    Aggravates Acid Reflux  . Prednisone Palpitations    Made pts heart race    REVIEW OF SYSTEMS:   CONSTITUTIONAL: No fever, fatigue or weakness.  EYES: No blurred or double vision.  EARS, NOSE, AND THROAT: No tinnitus or ear pain.  RESPIRATORY: No cough, shortness of breath, wheezing or  hemoptysis.  CARDIOVASCULAR: No chest pain, orthopnea, edema.  GASTROINTESTINAL: No nausea, vomiting, diarrhea or abdominal pain.  GENITOURINARY: No dysuria, hematuria.  ENDOCRINE: No polyuria, nocturia,  HEMATOLOGY: No anemia, easy bruising or bleeding SKIN: redness noted over the anterior shin area of left leg. MUSCULOSKELETAL: Has tenderness of left leg  NEUROLOGIC: No tingling, numbness, weakness.  PSYCHIATRY: No anxiety or depression.   MEDICATIONS AT HOME:  Prior to Admission medications   Medication Sig Start Date End Date Taking? Authorizing Provider  ALPRAZolam Duanne Moron) 0.5 MG tablet Take 0.5 mg by mouth at bedtime. For anxiety    Historical Provider, MD  Cetirizine HCl 10 MG CAPS Take by mouth as needed.     Historical Provider, MD  diltiazem (CARDIZEM) 30 MG tablet Take 1 tablet (30 mg total) by mouth 3 (three) times daily as needed (for breakthrough atrial fibrillation). 10/14/13   Minna Merritts, MD  famotidine (PEPCID) 40 MG tablet Take 40 mg by mouth as needed. 06/19/11 06/10/15  Gatha Mayer, MD  flecainide (TAMBOCOR) 50 MG tablet TAKE ONE TABLET BY MOUTH TWICE DAILY 06/10/15   Minna Merritts, MD  metoprolol succinate (TOPROL-XL) 25 MG 24 hr tablet Take 1 tablet (25 mg total) by mouth daily. 06/10/15   Minna Merritts, MD  propranolol (INDERAL) 20 MG tablet Take 1 tablet (20 mg total) by mouth 3 (three) times daily as needed (for breakthrough atrial fibrillation). Patient taking differently: Take 20 mg by mouth 3 (three) times daily as needed (for breakthrough atrial fibrillation). Pt uses for emergency use only 10/14/13   Minna Merritts, MD  rivaroxaban (XARELTO) 20 MG TABS tablet Take 1 tablet (20 mg total) by mouth daily. 06/10/15   Minna Merritts, MD  rosuvastatin (CRESTOR) 5 MG tablet Take 1 tablet (5 mg total) by mouth daily. 06/10/15   Minna Merritts, MD  Simethicone (GAS-X PO) Take 1 tablet by mouth as needed. For gas    Historical Provider, MD       PHYSICAL EXAMINATION:   VITAL SIGNS: Blood pressure 137/72, pulse 56, temperature 98 F (36.7 C), temperature source Oral, resp. rate 16, height 5\' 8"  (1.727 m), weight 63.957 kg (141 lb), SpO2 100 %.  GENERAL:  70 y.o.-year-old patient lying in the bed with no acute distress.  EYES: Pupils equal, round, reactive to light and accommodation. No scleral icterus. Extraocular muscles intact.  HEENT: Head atraumatic, normocephalic. Oropharynx and nasopharynx clear.  NECK:  Supple, no jugular venous distention. No thyroid enlargement, no tenderness.  LUNGS: Normal breath sounds bilaterally, no wheezing, rales,rhonchi or crepitation. No use of accessory muscles of respiration.  CARDIOVASCULAR: S1, S2 normal. No murmurs, rubs, or gallops.  ABDOMEN: Soft, nontender, nondistended. Bowel sounds present. No organomegaly or mass.  EXTREMITIES: No pedal edema, cyanosis, or clubbing. Redness and dry scab noted over the shin area. NEUROLOGIC: Cranial nerves II through XII are intact. Muscle strength 5/5 in all extremities. Sensation intact. Gait not checked.  PSYCHIATRIC: The patient is alert and oriented x 3.  SKIN: No obvious rash, lesion, or ulcer.   LABORATORY PANEL:   CBC  Recent Labs Lab 11/07/15 2326  WBC 8.0  HGB 12.7  HCT 37.3  PLT 176  MCV 88.1  MCH 29.9  MCHC 34.0  RDW 13.5   ------------------------------------------------------------------------------------------------------------------  Chemistries   Recent Labs Lab 11/07/15 2326  NA 135  K 4.0  CL 104  CO2 26  GLUCOSE 108*  BUN 22*  CREATININE 1.12*  CALCIUM 9.2   ------------------------------------------------------------------------------------------------------------------ estimated creatinine clearance is 47.1 mL/min (by C-G formula based on Cr of 1.12). ------------------------------------------------------------------------------------------------------------------ No results for input(s): TSH, T4TOTAL,  T3FREE, THYROIDAB in the last 72 hours.  Invalid input(s): FREET3   Coagulation profile No results for input(s): INR, PROTIME in the last 168 hours. ------------------------------------------------------------------------------------------------------------------- No results for input(s): DDIMER in the last 72 hours. -------------------------------------------------------------------------------------------------------------------  Cardiac Enzymes No results for input(s): CKMB, TROPONINI, MYOGLOBIN in the last 168 hours.  Invalid input(s): CK ------------------------------------------------------------------------------------------------------------------ Invalid input(s): POCBNP  ---------------------------------------------------------------------------------------------------------------  Urinalysis No results found for: COLORURINE, APPEARANCEUR, LABSPEC, PHURINE, GLUCOSEU, HGBUR, BILIRUBINUR, KETONESUR, PROTEINUR, UROBILINOGEN, NITRITE, LEUKOCYTESUR   RADIOLOGY: No results found.  EKG: Orders placed or performed in visit on 06/10/15  . EKG 12-Lead    IMPRESSION AND PLAN: 70 year old female patient with history of atrial fibrillation on Xarelto, hypertension, hyperlipidemia presented to the emergency room with redness and tenderness in the left leg over the anterior shin area. Admitting diagnosis 1. Cellulitis of left lower extremity 2. Hypertension 3. Hyperlipidemia 4. Chronic atrial fibrillation  Treatment plan Admit patient to medical floor Start patient on IV clindamycin antibiotic Follow-up cultures DVT prophylaxis resume oral xarelto Resume cardiac medications Supportive care.  All the records are reviewed and case discussed with ED provider. Management plans discussed with the patient, family and they are in agreement.  CODE STATUS:FULL    Code Status Orders        Start     Ordered   11/08/15 0421  Full code   Continuous     11/08/15 0421     Code Status History    Date Active Date Inactive Code Status Order ID Comments User Context   This patient has a current code status but no historical code status.       TOTAL TIME TAKING CARE OF THIS PATIENT: 50 minutes.    Saundra Shelling M.D on 11/08/2015 at 4:22 AM  Between 7am to 6pm - Pager - [phone]  After 6pm go to [email or web address] - password [email or web address] [facility]  Office  [phone]  CC: Primary care physician; [name], MD

## 2015-11-08 NOTE — Progress Notes (Signed)
Pharmacy Note - Anticoagulation  Estimated Creatinine Clearance: 62.9 mL/min (by C-G formula based on Cr of 0.84).  Rivaroxaban orders previously reduced to 15mg  PO daily due to CrCl < 40ml/min. Renal function improved, will resume previous dosing of rivaroxaban 20mg  PO daily  Rexene Edison, PharmD Clinical Pharmacist  11/08/2015 3:26 PM

## 2015-11-08 NOTE — ED Notes (Signed)
Pt ambulated to the bedside commode. Pt returned to the bed without difficulty.

## 2015-11-08 NOTE — Progress Notes (Signed)
[facility] [place] NAME: [name]    MR#:  [id number]  DATE OF BIRTH:  02/13/1946  SUBJECTIVE:  CHIEF COMPLAINT:   Chief Complaint  Patient presents with  . Leg Injury   the patient is 70 year old Caucasian female with past medical history significant for history of squamous cell skin carcinoma, gastric esophageal reflux disease, osteoporosis, hiatal hernia, hyperlipidemia, atrial fibrillation who presents to the hospital with complaints of redness of left leg, burning sensation in left leg. Evidently, patient had a small injury 6 weeks ago, which became red and indurated, she was initiated on oral antibiotics by primary care physician, completed doxycycline and Bactrim for 14 days, however, did not improve her and was admitted to the hospital, initiated on clindamycin intravenously. Patient feels better today. Redness is subsiding.   Review of Systems  Constitutional: Negative for fever, chills and weight loss.  HENT: Negative for congestion.   Eyes: Negative for blurred vision and double vision.  Respiratory: Negative for cough, sputum production, shortness of breath and wheezing.   Cardiovascular: Negative for chest pain, palpitations, orthopnea, leg swelling and PND.  Gastrointestinal: Negative for nausea, vomiting, abdominal pain, diarrhea, constipation and blood in stool.  Genitourinary: Negative for dysuria, urgency, frequency and hematuria.  Musculoskeletal: Negative for falls.  Skin: Positive for rash.  Neurological: Negative for dizziness, tremors, focal weakness and headaches.  Endo/Heme/Allergies: Does not bruise/bleed easily.  Psychiatric/Behavioral: Negative for depression. The patient does not have insomnia.     VITAL SIGNS: Blood pressure 115/57, pulse 58, temperature 97.9 F (36.6 C), temperature source Oral, resp. rate 18, height 5\' 8"  (1.727 m), weight 66.588 kg (146 lb 12.8 oz), SpO2 98 %.  PHYSICAL  EXAMINATION:   GENERAL:  70 y.o.-year-old patient lying in the bed with no acute distress.  EYES: Pupils equal, round, reactive to light and accommodation. No scleral icterus. Extraocular muscles intact.  HEENT: Head atraumatic, normocephalic. Oropharynx and nasopharynx clear.  NECK:  Supple, no jugular venous distention. No thyroid enlargement, no tenderness.  LUNGS: Normal breath sounds bilaterally, no wheezing, rales,rhonchi or crepitation. No use of accessory muscles of respiration.  CARDIOVASCULAR: S1, S2 , regular. No murmurs, rubs, or gallops.  ABDOMEN: Soft, nontender, nondistended. Bowel sounds present. No organomegaly or mass.  EXTREMITIES: No pedal edema, cyanosis, or clubbing. Left lower extremity anterior shin has a few areas of erythema and induration with scabbing ulcer, no purulent discharge from also noted. Mild increase of warmth on palpation and tenderness NEUROLOGIC: Cranial nerves II through XII are intact. Muscle strength 5/5 in all extremities. Sensation intact. Gait not checked.  PSYCHIATRIC: The patient is alert and oriented x 3.  SKIN: No obvious rash, lesion, or ulcer.   ORDERS/RESULTS REVIEWED:   CBC  Recent Labs Lab 11/07/15 2326 11/08/15 0640  WBC 8.0 6.7  HGB 12.7 12.9  HCT 37.3 37.9  PLT 176 173  MCV 88.1 90.0  MCH 29.9 30.6  MCHC 34.0 34.0  RDW 13.5 13.6   ------------------------------------------------------------------------------------------------------------------  Chemistries   Recent Labs Lab 11/07/15 2326 11/08/15 0640  NA 135 137  K 4.0 4.6  CL 104 106  CO2 26 26  GLUCOSE 108* 90  BUN 22* 20  CREATININE 1.12* 0.84  CALCIUM 9.2 9.0   ------------------------------------------------------------------------------------------------------------------ estimated creatinine clearance is 62.9 mL/min (by C-G formula based on Cr of  0.84). ------------------------------------------------------------------------------------------------------------------ No results for input(s): TSH, T4TOTAL, T3FREE, THYROIDAB in the last 72 hours.  Invalid input(s): FREET3  Cardiac Enzymes  No results for input(s): CKMB, TROPONINI, MYOGLOBIN in the last 168 hours.  Invalid input(s): CK ------------------------------------------------------------------------------------------------------------------ Invalid input(s): POCBNP ---------------------------------------------------------------------------------------------------------------  RADIOLOGY: No results found.  EKG:  Orders placed or performed in visit on 06/10/15  . EKG 12-Lead    ASSESSMENT AND PLAN:  Principal Problem:   Cellulitis  1. Left lower extremity cellulitis, improving on clindamycin intravenously, continue clindamycin IV, possible discharge home if significant improvement by tomorrow. Blood cultures are negative so far. 2. Acute renal insufficiency, resolved with IV fluids, discontinue IV fluids. 3. Hyperglycemia, get hemoglobin A1c 4. Chronic atrial fibrillation, continue outpatient medications with diltiazem, metoprolol, Xarelto, Flecainide   Management plans discussed with the patient, family and they are in agreement.   DRUG ALLERGIES:  Allergies  Allergen Reactions  . Latex   . Adhesive [Tape] Rash  . Aspirin Other (See Comments)    Aggravates Acid Reflux  . Prednisone Palpitations    Made pts heart race    CODE STATUS:     Code Status Orders        Start     Ordered   11/08/15 0421  Full code   Continuous     11/08/15 0421    Code Status History    Date Active Date Inactive Code Status Order ID Comments User Context   This patient has a current code status but no historical code status.      TOTAL TIME TAKING CARE OF THIS PATIENT: 40 minutes.    Theodoro Grist M.D on 11/08/2015 at 12:57 PM  Between 7am to 6pm - Pager -  [phone]  After 6pm go to [email or web address] - password [email or web address]  Office  [phone]  CC: Primary care physician; [name], MD

## 2015-11-08 NOTE — Progress Notes (Signed)
Pastoral Care and prayer provided.  °

## 2015-11-09 DIAGNOSIS — L03116 Cellulitis of left lower limb: Secondary | ICD-10-CM | POA: Diagnosis not present

## 2015-11-09 MED ORDER — FAMOTIDINE 40 MG PO TABS: 40.0000 mg | ORAL_TABLET | Freq: Every day | ORAL | Status: DC

## 2015-11-09 MED ORDER — PNEUMOCOCCAL VAC POLYVALENT 25 MCG/0.5ML IJ INJ
0.5000 mL | INJECTION | INTRAMUSCULAR | Status: AC
  Administered 2015-11-09: 0.5 mL via INTRAMUSCULAR
  Filled 2015-11-09: qty 0.5

## 2015-11-09 MED ORDER — CLINDAMYCIN HCL 300 MG PO CAPS: 300.0000 mg | ORAL_CAPSULE | Freq: Three times a day (TID) | ORAL | Status: DC

## 2015-11-09 NOTE — Discharge Instructions (Signed)
Cellulitis °Cellulitis is an infection of the skin and the tissue under the skin. The infected area is usually red and tender. This happens most often in the arms and lower legs. °HOME CARE  °· Take your antibiotic medicine as told. Finish the medicine even if you start to feel better. °· Keep the infected arm or leg raised (elevated). °· Put a warm cloth on the area up to 4 times per day. °· Only take medicines as told by your doctor. °· Keep all doctor visits as told. °GET HELP IF: °· You see red streaks on the skin coming from the infected area. °· Your red area gets bigger or turns a dark color. °· Your bone or joint under the infected area is painful after the skin heals. °· Your infection comes back in the same area or different area. °· You have a puffy (swollen) bump in the infected area. °· You have new symptoms. °· You have a fever. °GET HELP RIGHT AWAY IF:  °· You feel very sleepy. °· You throw up (vomit) or have watery poop (diarrhea). °· You feel sick and have muscle aches and pains. °  °This information is not intended to replace advice given to you by your health care provider. Make sure you discuss any questions you have with your health care provider. °  °Document Released: 12/26/2007 Document Revised: 03/30/2015 Document Reviewed: 09/24/2011 °Elsevier Interactive Patient Education ©2016 [employer] ° °

## 2015-11-09 NOTE — Discharge Summary (Signed)
[facility] [place] NAME: [name]    MR#:  [id number]  DATE OF BIRTH:  1946-01-28  DATE OF ADMISSION:  11/08/2015 ADMITTING PHYSICIAN: Saundra Shelling, MD  DATE OF DISCHARGE: 11/09/2015  PRIMARY CARE PHYSICIAN: [name], MD    ADMISSION DIAGNOSIS:  Cellulitis of left lower extremity [id number]  DISCHARGE DIAGNOSIS:  Principal Problem:   Cellulitis  SECONDARY DIAGNOSIS:   Past Medical History  Diagnosis Date  . Mitral valve disorders     a. 12/2011 Echo: EF 55-60%, NRWMA, No evidence of MVP, Mild MR  . Collagenous colitis   . Osteoporosis   . GERD (gastroesophageal reflux disease)   . IBS (irritable bowel syndrome)   . Internal hemorrhoids 2010    Colon-Dr. Vira Agar   . Schatzki's ring 2010     EGD- Dr. Vira Agar   . Hiatal hernia 2004  . Cataract   . Hyperlipidemia   . Midsternal chest pain     a. 01/2011 Ex Mv: EF 68%, No ischemia.  . Squamous cell carcinoma of skin     multiple  . Heart murmur   . Atrial fibrillation ([place])     a. diagnosed 12/2011  . Migraine 01/16/12    "none now for several years"  . Arthritis     "fingers; right shoulder"  . Kidney stones   . Broken ribs   . Esophageal candidiasis ([place])   . Ulcerative colitis ([place])   . Diverticulosis     HOSPITAL COURSE:  70 y.o. female with a known history of gastroesophageal reflux disorder, osteoporosis, hiatal hernia, hyperlipidemia, atrial fibrillation, squamous cell carcinoma of skin admitted for redness in the left leg  1. Left lower extremity cellulitis: Had significant improvement with IV clindamycin.  Erythema has Resolved 2. Acute renal insufficiency, resolved with IV fluids.  Likely prerenal 3. Hyperglycemia: Stress-induced 4. Chronic atrial fibrillation, continue outpatient medications with diltiazem, metoprolol, Xarelto, Flecainide.  Rate well controlled  Patient is doing much better and is being discharged home in stable condition.  She  is agreeable with the discharge planning.  She would like to follow-up with Dr. Nehemiah Massed from dermatology and she will make an appointment to see him. DISCHARGE CONDITIONS:  Stable CONSULTS OBTAINED:     DRUG ALLERGIES:   Allergies  Allergen Reactions  . Latex   . Adhesive [Tape] Rash  . Aspirin Other (See Comments)    Aggravates Acid Reflux  . Prednisone Palpitations    Made pts heart race    DISCHARGE MEDICATIONS:   Current Discharge Medication List    START taking these medications   Details  clindamycin (CLEOCIN) 300 MG capsule Take 1 capsule (300 mg total) by mouth 3 (three) times daily. Qty: 12 capsule, Refills: 0      CONTINUE these medications which have CHANGED   Details  famotidine (PEPCID) 40 MG tablet Take 1 tablet (40 mg total) by mouth daily. Qty: 30 tablet, Refills: 0      CONTINUE these medications which have NOT CHANGED   Details  ALPRAZolam (XANAX) 0.5 MG tablet Take 0.5 mg by mouth at bedtime. For anxiety    Cetirizine HCl 10 MG CAPS Take by mouth as needed.     diltiazem (CARDIZEM) 30 MG tablet Take 1 tablet (30 mg total) by mouth 3 (three) times daily as needed (for breakthrough atrial fibrillation). Qty: 90 tablet, Refills: 4    flecainide (TAMBOCOR) 50 MG tablet TAKE ONE TABLET BY MOUTH TWICE DAILY  Qty: 180 tablet, Refills: 3    metoprolol succinate (TOPROL-XL) 25 MG 24 hr tablet Take 1 tablet (25 mg total) by mouth daily. Qty: 90 tablet, Refills: 4    propranolol (INDERAL) 20 MG tablet Take 1 tablet (20 mg total) by mouth 3 (three) times daily as needed (for breakthrough atrial fibrillation). Qty: 90 tablet, Refills: 4    rivaroxaban (XARELTO) 20 MG TABS tablet Take 1 tablet (20 mg total) by mouth daily. Qty: 30 tablet, Refills: 11    rosuvastatin (CRESTOR) 5 MG tablet Take 1 tablet (5 mg total) by mouth daily. Qty: 90 tablet, Refills: 3    Simethicone (GAS-X PO) Take 1 tablet by mouth as needed. For gas      STOP taking these  medications     omeprazole (PRILOSEC OTC) 20 MG tablet          DISCHARGE INSTRUCTIONS:    DIET:  Regular diet DISCHARGE CONDITION:  Good ACTIVITY:  Activity as tolerated OXYGEN:  Home Oxygen: No.  Oxygen Delivery: room air DISCHARGE LOCATION:  home   If you experience worsening of your admission symptoms, develop shortness of breath, life threatening emergency, suicidal or homicidal thoughts you must seek medical attention immediately by calling 911 or calling your MD immediately  if symptoms less severe.  You Must read complete instructions/literature along with all the possible adverse reactions/side effects for all the Medicines you take and that have been prescribed to you. Take any new Medicines after you have completely understood and accpet all the possible adverse reactions/side effects.   Please note  You were cared for by a hospitalist during your hospital stay. If you have any questions about your discharge medications or the care you received while you were in the hospital after you are discharged, you can call the unit and asked to speak with the hospitalist on call if the hospitalist that took care of you is not available. Once you are discharged, your primary care physician will handle any further medical issues. Please note that NO REFILLS for any discharge medications will be authorized once you are discharged, as it is imperative that you return to your primary care physician (or establish a relationship with a primary care physician if you do not have one) for your aftercare needs so that they can reassess your need for medications and monitor your lab values.    On the day of Discharge:  VITAL SIGNS:  Blood pressure 114/56, pulse 57, temperature 98.2 F (36.8 C), temperature source Oral, resp. rate 18, height 5\' 8"  (1.727 m), weight 66.588 kg (146 lb 12.8 oz), SpO2 98 %. PHYSICAL EXAMINATION:  GENERAL:  70 y.o.-year-old patient lying in the bed with no acute  distress.  EYES: Pupils equal, round, reactive to light and accommodation. No scleral icterus. Extraocular muscles intact.  HEENT: Head atraumatic, normocephalic. Oropharynx and nasopharynx clear.  NECK:  Supple, no jugular venous distention. No thyroid enlargement, no tenderness.  LUNGS: Normal breath sounds bilaterally, no wheezing, rales,rhonchi or crepitation. No use of accessory muscles of respiration.  CARDIOVASCULAR: S1, S2 normal. No murmurs, rubs, or gallops.  ABDOMEN: Soft, non-tender, non-distended. Bowel sounds present. No organomegaly or mass.  EXTREMITIES: No pedal edema, cyanosis, or clubbing.  NEUROLOGIC: Cranial nerves II through XII are intact. Muscle strength 5/5 in all extremities. Sensation intact. Gait not checked.  PSYCHIATRIC: The patient is alert and oriented x 3.  SKIN: Left lower extremity anterior shin has a no erythema and mild induration with scabbing, no purulent  discharge.  DATA REVIEW:   CBC  Recent Labs Lab 11/08/15 0640  WBC 6.7  HGB 12.9  HCT 37.9  PLT 173    Chemistries   Recent Labs Lab 11/08/15 0640  NA 137  K 4.6  CL 106  CO2 26  GLUCOSE 90  BUN 20  CREATININE 0.84  CALCIUM 9.0     Management plans discussed with the patient, family and they are in agreement.  CODE STATUS: Full code  TOTAL TIME TAKING CARE OF THIS PATIENT: 40 minutes.    [facility],  M.D on 11/09/2015 at 12:11 PM  Between 7am to 6pm - Pager - [phone]  After 6pm go to [email or web address] - password [email or web address]  Office  [phone]  CC: Primary care physician; [name], MD   Note: This dictation was prepared with Dragon dictation along with smaller phrase technology. Any transcriptional errors that result from this process are unintentional.

## 2015-11-10 DIAGNOSIS — L570 Actinic keratosis: Secondary | ICD-10-CM | POA: Diagnosis not present

## 2015-11-10 DIAGNOSIS — L578 Other skin changes due to chronic exposure to nonionizing radiation: Secondary | ICD-10-CM | POA: Diagnosis not present

## 2015-11-10 DIAGNOSIS — D485 Neoplasm of uncertain behavior of skin: Secondary | ICD-10-CM | POA: Diagnosis not present

## 2015-11-10 DIAGNOSIS — Z85828 Personal history of other malignant neoplasm of skin: Secondary | ICD-10-CM | POA: Diagnosis not present

## 2015-11-10 DIAGNOSIS — S80922A Unspecified superficial injury of left lower leg, initial encounter: Secondary | ICD-10-CM | POA: Diagnosis not present

## 2015-11-10 DIAGNOSIS — C44729 Squamous cell carcinoma of skin of left lower limb, including hip: Secondary | ICD-10-CM | POA: Diagnosis not present

## 2015-11-12 LAB — CULTURE, BLOOD (ROUTINE X 2): CULTURE: NO GROWTH

## 2015-12-11 ENCOUNTER — Encounter (HOSPITAL_COMMUNITY): Payer: Self-pay | Admitting: Emergency Medicine

## 2015-12-11 ENCOUNTER — Emergency Department (HOSPITAL_COMMUNITY)
Admission: EM | Admit: 2015-12-11 | Discharge: 2015-12-12 | Disposition: A | Discharge status: Home / Self Care | Payer: Medicare Other | Attending: Emergency Medicine | Admitting: Emergency Medicine

## 2015-12-11 ENCOUNTER — Emergency Department (HOSPITAL_COMMUNITY): Payer: Medicare Other

## 2015-12-11 DIAGNOSIS — Z87891 Personal history of nicotine dependence: Secondary | ICD-10-CM | POA: Insufficient documentation

## 2015-12-11 DIAGNOSIS — R0602 Shortness of breath: Secondary | ICD-10-CM | POA: Diagnosis not present

## 2015-12-11 DIAGNOSIS — R011 Cardiac murmur, unspecified: Secondary | ICD-10-CM | POA: Insufficient documentation

## 2015-12-11 DIAGNOSIS — Z8619 Personal history of other infectious and parasitic diseases: Secondary | ICD-10-CM | POA: Diagnosis not present

## 2015-12-11 DIAGNOSIS — Q394 Esophageal web: Secondary | ICD-10-CM | POA: Insufficient documentation

## 2015-12-11 DIAGNOSIS — I4891 Unspecified atrial fibrillation: Secondary | ICD-10-CM

## 2015-12-11 DIAGNOSIS — Z8739 Personal history of other diseases of the musculoskeletal system and connective tissue: Secondary | ICD-10-CM | POA: Insufficient documentation

## 2015-12-11 DIAGNOSIS — E785 Hyperlipidemia, unspecified: Secondary | ICD-10-CM | POA: Insufficient documentation

## 2015-12-11 DIAGNOSIS — G43909 Migraine, unspecified, not intractable, without status migrainosus: Secondary | ICD-10-CM | POA: Insufficient documentation

## 2015-12-11 DIAGNOSIS — Z79899 Other long term (current) drug therapy: Secondary | ICD-10-CM | POA: Diagnosis not present

## 2015-12-11 DIAGNOSIS — Z8781 Personal history of (healed) traumatic fracture: Secondary | ICD-10-CM | POA: Diagnosis not present

## 2015-12-11 DIAGNOSIS — Z85828 Personal history of other malignant neoplasm of skin: Secondary | ICD-10-CM | POA: Insufficient documentation

## 2015-12-11 DIAGNOSIS — Z792 Long term (current) use of antibiotics: Secondary | ICD-10-CM | POA: Diagnosis not present

## 2015-12-11 DIAGNOSIS — Z87442 Personal history of urinary calculi: Secondary | ICD-10-CM | POA: Diagnosis not present

## 2015-12-11 DIAGNOSIS — Z8719 Personal history of other diseases of the digestive system: Secondary | ICD-10-CM | POA: Diagnosis not present

## 2015-12-11 DIAGNOSIS — R002 Palpitations: Secondary | ICD-10-CM | POA: Diagnosis present

## 2015-12-11 LAB — CBC
HCT: 38.6 % (ref 36.0–46.0)
HEMOGLOBIN: 12.5 g/dL (ref 12.0–15.0)
MCH: 28.9 pg (ref 26.0–34.0)
MCHC: 32.4 g/dL (ref 30.0–36.0)
MCV: 89.1 fL (ref 78.0–100.0)
PLATELETS: 193 10*3/uL (ref 150–400)
RBC: 4.33 MIL/uL (ref 3.87–5.11)
RDW: 13.5 % (ref 11.5–15.5)
WBC: 9.1 10*3/uL (ref 4.0–10.5)

## 2015-12-11 LAB — BASIC METABOLIC PANEL
Anion gap: 8 (ref 5–15)
BUN: 17 mg/dL (ref 6–20)
CALCIUM: 9.8 mg/dL (ref 8.9–10.3)
CHLORIDE: 107 mmol/L (ref 101–111)
CO2: 24 mmol/L (ref 22–32)
CREATININE: 0.84 mg/dL (ref 0.44–1.00)
GFR calc Af Amer: >60 mL/min (ref >60)
GFR calc non Af Amer: >60 mL/min (ref >60)
GLUCOSE: 122 mg/dL — AB (ref 65–99)
Potassium: 3.3 mmol/L — ABNORMAL LOW (ref 3.5–5.1)
Sodium: 139 mmol/L (ref 135–145)

## 2015-12-11 LAB — PROTIME-INR
INR: 3.1 — ABNORMAL HIGH (ref 0.00–1.49)
PROTHROMBIN TIME: 31.4 s — AB (ref 11.6–15.2)

## 2015-12-11 LAB — I-STAT TROPONIN, ED: Troponin i, poc: 0.01 ng/mL (ref 0.00–0.08)

## 2015-12-11 MED ORDER — POTASSIUM CHLORIDE CRYS ER 20 MEQ PO TBCR
40.0000 meq | EXTENDED_RELEASE_TABLET | Freq: Once | ORAL | Status: AC
  Administered 2015-12-12: 40 meq via ORAL
  Filled 2015-12-11: qty 2

## 2015-12-11 NOTE — ED Provider Notes (Addendum)
By signing my name below, I, Hansel Feinstein, attest that this documentation has been prepared under the direction and in the presence of [place], DO. Electronically Signed: Hansel Feinstein, ED Scribe. 12/11/2015. 11:34 PM.   TIME SEEN: 11:21 PM   CHIEF COMPLAINT:  Chief Complaint  Patient presents with  . Palpitations    HPI:  HPI Comments: [name] is a 70 y.o. female with h/o Afib on Cardizem, Flecainide, metoprolol and Xarelto, HLD who presents to the Emergency Department complaining of a sensation of palpitations onset at 9:50PM with associated SOB, chest pressure. Pt states that her symptoms began after turning on her side while lying in bed. She reports she attempted various maneuvers to alleviate her symptoms as directed by her cardiologist with no relief, and subsequently developed a HA. She states she then took her BP, finding it to be in the 160s/130s. She also reports transient lightheadedness and nausea during the episode, which has currently resolved. She notes that her palpitations, HA and chest pressure have been improving since onset. Pt reports her current symptoms are similar to symptoms prior to cardioversion. Per pt, she took all her prescribed medications at 9:20PM this evening, and denies taking any extra doses. Per pt, she has not missed any doses of medications recently. She reports she has felt otherwise well leading up to her symptoms this evening. She is followed by [facility] cardiology Dr. Rockey Situ. Per pt, she has an appointment tomorrow morning with her cardiologist at [facility]. PCP is Dr. Humphrey Rolls. She denies emesis, diaphoresis, fever, cough diarrhea, abdominal pain.   ROS: See HPI Constitutional: no fever  Eyes: no drainage  ENT: no runny nose   Cardiovascular:  Positive chest pain/pressure (improving), palpitations (improving) Resp: Positive SOB  GI: no vomiting. Positive nausea (transient) GU: no dysuria Integumentary: no rash  Allergy: no hives  Musculoskeletal:  no leg swelling  Neurological: no slurred speech. Positive HA (improving), lightheadedness (transient) ROS otherwise negative  PAST MEDICAL HISTORY/PAST SURGICAL HISTORY:  Past Medical History  Diagnosis Date  . Mitral valve disorders     a. 12/2011 Echo: EF 55-60%, NRWMA, No evidence of MVP, Mild MR  . Collagenous colitis   . Osteoporosis   . GERD (gastroesophageal reflux disease)   . IBS (irritable bowel syndrome)   . Internal hemorrhoids 2010    Colon-Dr. Vira Agar   . Schatzki's ring 2010     EGD- Dr. Vira Agar   . Hiatal hernia 2004  . Cataract   . Hyperlipidemia   . Midsternal chest pain     a. 01/2011 Ex Mv: EF 68%, No ischemia.  . Squamous cell carcinoma of skin     multiple  . Heart murmur   . Atrial fibrillation ([place])     a. diagnosed 12/2011  . Migraine 01/16/12    "none now for several years"  . Arthritis     "fingers; right shoulder"  . Kidney stones   . Broken ribs   . Esophageal candidiasis ([place])   . Ulcerative colitis ([place])   . Diverticulosis     MEDICATIONS:  Prior to Admission medications   Medication Sig Start Date End Date Taking? Authorizing Provider  ALPRAZolam Duanne Moron) 0.5 MG tablet Take 0.5 mg by mouth at bedtime. For anxiety    Historical Provider, MD  Cetirizine HCl 10 MG CAPS Take by mouth as needed.     Historical Provider, MD  clindamycin (CLEOCIN) 300 MG capsule Take 1 capsule (300 mg total) by mouth 3 (three) times  daily. 11/09/15   Max Sane, MD  diltiazem (CARDIZEM) 30 MG tablet Take 1 tablet (30 mg total) by mouth 3 (three) times daily as needed (for breakthrough atrial fibrillation). 10/14/13   Minna Merritts, MD  famotidine (PEPCID) 40 MG tablet Take 1 tablet (40 mg total) by mouth daily. 11/09/15 10/31/19  Max Sane, MD  flecainide (TAMBOCOR) 50 MG tablet TAKE ONE TABLET BY MOUTH TWICE DAILY Patient taking differently: Take 50 mg by mouth once. Pt takes bid only if needed 06/10/15   Minna Merritts, MD  metoprolol succinate (TOPROL-XL) 25  MG 24 hr tablet Take 1 tablet (25 mg total) by mouth daily. 06/10/15   Minna Merritts, MD  propranolol (INDERAL) 20 MG tablet Take 1 tablet (20 mg total) by mouth 3 (three) times daily as needed (for breakthrough atrial fibrillation). Patient taking differently: Take 20 mg by mouth 3 (three) times daily as needed (for breakthrough atrial fibrillation). Pt uses for emergency use only 10/14/13   Minna Merritts, MD  rivaroxaban (XARELTO) 20 MG TABS tablet Take 1 tablet (20 mg total) by mouth daily. 06/10/15   Minna Merritts, MD  rosuvastatin (CRESTOR) 5 MG tablet Take 1 tablet (5 mg total) by mouth daily. 06/10/15   Minna Merritts, MD  Simethicone (GAS-X PO) Take 1 tablet by mouth as needed. For gas    Historical Provider, MD    ALLERGIES:  Allergies  Allergen Reactions  . Latex   . Adhesive [Tape] Rash  . Aspirin Other (See Comments)    Aggravates Acid Reflux  . Prednisone Palpitations    Made pts heart race    SOCIAL HISTORY:  Social History  Substance Use Topics  . Smoking status: Former Smoker -- 0.10 packs/day for 40 years    Types: Cigarettes    Quit date: 01/16/2012  . Smokeless tobacco: Never Used  . Alcohol Use: No     Comment: rare    FAMILY HISTORY: Family History  Problem Relation Age of Onset  . Colon cancer Neg Hx   . Heart disease Father     A Fib and CHF - died in Mid 40  . Diabetes Father   . Kidney disease Father   . Irritable bowel syndrome Father   . Alzheimer's disease Mother     Died in [place] 28's.    EXAM: BP 145/85 mmHg  Pulse 87  Temp(Src) 98.9 F (37.2 C) (Oral)  Resp 20  SpO2 98% CONSTITUTIONAL: Alert and oriented and responds appropriately to questions. Well-appearing; well-nourished. Elderly  HEAD: Normocephalic EYES: Conjunctivae clear, PERRL ENT: normal nose; no rhinorrhea; moist mucous membranes NECK: Supple, no meningismus, no LAD  CARD: RRR; S1 and S2 appreciated; no murmurs, no clicks, no rubs, no gallops RESP: Normal chest  excursion without splinting or tachypnea; breath sounds clear and equal bilaterally; no wheezes, no rhonchi, no rales, no hypoxia or respiratory distress, speaking full sentences ABD/GI: Normal bowel sounds; non-distended; soft, non-tender, no rebound, no guarding, no peritoneal signs BACK:  The back appears normal and is non-tender to palpation, there is no CVA tenderness EXT: Normal ROM in all joints; non-tender to palpation; no edema; normal capillary refill; no cyanosis, no calf tenderness or swelling    SKIN: Normal color for age and race; warm; no rash NEURO: Moves all extremities equally, sensation to light touch intact diffusely, cranial nerves II through XII intact PSYCH: The patient's mood and manner are appropriate. Grooming and personal hygiene are appropriate.  MEDICAL DECISION MAKING:  Patient here with atrial fibrillation, chest pressure and shortness of breath. Heart rate fluctuates between the 120s to 160s. While I was in the room discussing this with the patient she spontaneously converted back to normal sinus rhythm in the 80s. Reports her chest pressure, shortness of breath have resolved. Hemodynamically stable. No recent infectious symptoms but did have cellulitis over a month ago. No longer on antibiotics. Has been taking her medications as prescribed without any missed doses. Denies any alcohol use, caffeine use, other stimulants. Labs show potassium of 3.3 which we will replace. Otherwise unremarkable with negative troponin. We'll repeat troponin given she was symptomatic during this episode. Will continue to closely monitor patient but I feel if she continues to be asymptomatic and in normal sinus rhythm with a controlled rate with negative workup that she can be discharged home to see her cardiologist the morning. Patient and family are comfortable with this plan.  ED PROGRESS: 3:00 AM  Patient is still asymptomatic. Her second troponin is negative. She feels comfortable with plan  for discharge home with outpatient follow-up with her cardiologist in the morning as scheduled. Discussed return precautions.   At this time, I do not feel there is any life-threatening condition present. I have reviewed and discussed all results (EKG, imaging, lab, urine as appropriate), exam findings with patient. I have reviewed nursing notes and appropriate previous records.  I feel the patient is safe to be discharged home without further emergent workup. Discussed usual and customary return precautions. Patient and family (if present) verbalize understanding and are comfortable with this plan.  Patient will follow-up with their primary care provider. If they do not have a primary care provider, information for follow-up has been provided to them. All questions have been answered.     EKG Interpretation  Date/Time:  Sunday Dec 11 2015 22:52:41 EDT Ventricular Rate:  160 PR Interval:    QRS Duration: 92 QT Interval:  292 QTC Calculation: 476 R Axis:   58 Text Interpretation:  Atrial fibrillation with rapid ventricular response Incomplete right bundle branch block Marked ST abnormality, possible inferior subendocardial injury Abnormal ECG A fib new compared to prior Diffuse ST depression, likely rate related Confirmed by ,  DO,  [phone]) on 12/11/2015 11:17:08 PM          EKG Interpretation  Date/Time:  Sunday Dec 11 2015 23:11:11 EDT Ventricular Rate:  125 PR Interval:    QRS Duration: 99 QT Interval:  314 QTC Calculation: 453 R Axis:   55 Text Interpretation:  Atrial fibrillation Repol abnrm suggests ischemia, diffuse leads likely rate related Confirmed by ,  DO,  [phone]) on 12/11/2015 11:18:06 PM         EKG Interpretation  Date/Time:  Sunday Dec 11 2015 23:35:21 EDT Ventricular Rate:  77 PR Interval:  151 QRS Duration: 117 QT Interval:  403 QTC Calculation: 456 R Axis:   46 Text Interpretation:  Sinus rhythm Nonspecific intraventricular  conduction delay Atrial fibrillation has resolved Confirmed by ,  DO,  [phone]) on 12/11/2015 11:41:53 PM        I personally performed the services described in this documentation, which was scribed in my presence. The recorded information has been reviewed and is accurate.   [place], DO 12/12/15 [place], DO 12/12/15 [id number]

## 2015-12-11 NOTE — ED Notes (Signed)
Pt. reports palpitations with mid chest pressure onset this evening with mild SOB , denies nausea or diaphoresis .

## 2015-12-11 NOTE — ED Notes (Signed)
EDP aware pt HR 120's-140's. Repeat EKG done, given to Ward MD

## 2015-12-12 ENCOUNTER — Other Ambulatory Visit
Admission: RE | Admit: 2015-12-12 | Discharge: 2015-12-12 | Disposition: A | Discharge status: Home / Self Care | Payer: Medicare Other | Source: Ambulatory Visit | Attending: Cardiovascular Disease | Admitting: Cardiovascular Disease

## 2015-12-12 ENCOUNTER — Encounter: Payer: Self-pay | Admitting: Cardiovascular Disease

## 2015-12-12 ENCOUNTER — Ambulatory Visit (INDEPENDENT_AMBULATORY_CARE_PROVIDER_SITE_OTHER): Payer: Medicare Other | Admitting: Cardiovascular Disease

## 2015-12-12 VITALS — BP 122/62 | HR 58 | Ht 68.0 in | Wt 144.5 lb

## 2015-12-12 DIAGNOSIS — R002 Palpitations: Secondary | ICD-10-CM

## 2015-12-12 DIAGNOSIS — F17200 Nicotine dependence, unspecified, uncomplicated: Secondary | ICD-10-CM

## 2015-12-12 DIAGNOSIS — I4891 Unspecified atrial fibrillation: Secondary | ICD-10-CM

## 2015-12-12 DIAGNOSIS — E785 Hyperlipidemia, unspecified: Secondary | ICD-10-CM | POA: Diagnosis not present

## 2015-12-12 DIAGNOSIS — R079 Chest pain, unspecified: Secondary | ICD-10-CM

## 2015-12-12 DIAGNOSIS — Z7901 Long term (current) use of anticoagulants: Secondary | ICD-10-CM

## 2015-12-12 DIAGNOSIS — Z720 Tobacco use: Secondary | ICD-10-CM | POA: Diagnosis not present

## 2015-12-12 LAB — LIPID PANEL
Cholesterol: 145 mg/dL (ref 0–200)
HDL: 71 mg/dL (ref >40)
LDL Cholesterol: 67 mg/dL (ref 0–99)
Total CHOL/HDL Ratio: 2 RATIO
Triglycerides: 37 mg/dL (ref <150)
VLDL: 7 mg/dL (ref 0–40)

## 2015-12-12 LAB — I-STAT TROPONIN, ED: TROPONIN I, POC: 0.01 ng/mL (ref 0.00–0.08)

## 2015-12-12 LAB — HEPATIC FUNCTION PANEL
ALBUMIN: 4.2 g/dL (ref 3.5–5.0)
ALK PHOS: 44 U/L (ref 38–126)
ALT: 17 U/L (ref 14–54)
AST: 22 U/L (ref 15–41)
Bilirubin, Direct: <0.1 mg/dL — ABNORMAL LOW (ref 0.1–0.5)
TOTAL PROTEIN: 7 g/dL (ref 6.5–8.1)
Total Bilirubin: 0.6 mg/dL (ref 0.3–1.2)

## 2015-12-12 LAB — MAGNESIUM: MAGNESIUM: 2 mg/dL (ref 1.7–2.4)

## 2015-12-12 NOTE — Discharge Instructions (Signed)
Atrial Fibrillation °Atrial fibrillation is a type of irregular or rapid heartbeat (arrhythmia). In atrial fibrillation, the heart quivers continuously in a chaotic pattern. This occurs when parts of the heart receive disorganized signals that make the heart unable to pump blood normally. This can increase the risk for stroke, heart failure, and other heart-related conditions. There are different types of atrial fibrillation, including: °· Paroxysmal atrial fibrillation. This type starts suddenly, and it usually stops on its own shortly after it starts. °· Persistent atrial fibrillation. This type often lasts longer than a week. It may stop on its own or with treatment. °· Long-lasting persistent atrial fibrillation. This type lasts longer than 12 months. °· Permanent atrial fibrillation. This type does not go away. °Talk with your health care provider to learn about the type of atrial fibrillation that you have. °CAUSES °This condition is caused by some heart-related conditions or procedures, including: °· A heart attack. °· Coronary artery disease. °· Heart failure. °· Heart valve conditions. °· High blood pressure. °· Inflammation of the sac that surrounds the heart (pericarditis). °· Heart surgery. °· Certain heart rhythm disorders, such as Wolf-Parkinson-White syndrome. °Other causes include: °· Pneumonia. °· Obstructive sleep apnea. °· Blockage of an artery in the lungs (pulmonary embolism, or PE). °· Lung cancer. °· Chronic lung disease. °· Thyroid problems, especially if the thyroid is overactive (hyperthyroidism). °· Caffeine. °· Excessive alcohol use or illegal drug use. °· Use of some medicines, including certain decongestants and diet pills. °Sometimes, the cause cannot be found. °RISK FACTORS °This condition is more likely to develop in: °· People who are older in age. °· People who smoke. °· People who have diabetes mellitus. °· People who are overweight (obese). °· Athletes who exercise  vigorously. °SYMPTOMS °Symptoms of this condition include: °· A feeling that your heart is beating rapidly or irregularly. °· A feeling of discomfort or pain in your chest. °· Shortness of breath. °· Sudden light-headedness or weakness. °· Getting tired easily during exercise. °In some cases, there are no symptoms. °DIAGNOSIS °Your health care provider may be able to detect atrial fibrillation when taking your pulse. If detected, this condition may be diagnosed with: °· An electrocardiogram (ECG). °· A Holter monitor test that records your heartbeat patterns over a 24-hour period. °· Transthoracic echocardiogram (TTE) to evaluate how blood flows through your heart. °· Transesophageal echocardiogram (TEE) to view more detailed images of your heart. °· A stress test. °· Imaging tests, such as a CT scan or chest X-ray. °· Blood tests. °TREATMENT °The main goals of treatment are to prevent blood clots from forming and to keep your heart beating at a normal rate and rhythm. The type of treatment that you receive depends on many factors, such as your underlying medical conditions and how you feel when you are experiencing atrial fibrillation. °This condition may be treated with: °· Medicine to slow down the heart rate, bring the heart's rhythm back to normal, or prevent clots from forming. °· Electrical cardioversion. This is a procedure that resets your heart's rhythm by delivering a controlled, low-energy shock to the heart through your skin. °· Different types of ablation, such as catheter ablation, catheter ablation with pacemaker, or surgical ablation. These procedures destroy the heart tissues that send abnormal signals. When the pacemaker is used, it is placed under your skin to help your heart beat in a regular rhythm. °HOME CARE INSTRUCTIONS °· Take over-the counter and prescription medicines only as told by your health care provider. °·   If your health care provider prescribed a blood-thinning medicine  (anticoagulant), take it exactly as told. Taking too much blood-thinning medicine can cause bleeding. If you do not take enough blood-thinning medicine, you will not have the protection that you need against stroke and other problems. °· Do not use tobacco products, including cigarettes, chewing tobacco, and e-cigarettes. If you need help quitting, ask your health care provider. °· If you have obstructive sleep apnea, manage your condition as told by your health care provider. °· Do not drink alcohol. °· Do not drink beverages that contain caffeine, such as coffee, soda, and tea. °· Maintain a healthy weight. Do not use diet pills unless your health care provider approves. Diet pills may make heart problems worse. °· Follow diet instructions as told by your health care provider. °· Exercise regularly as told by your health care provider. °· Keep all follow-up visits as told by your health care provider. This is important. °PREVENTION °· Avoid drinking beverages that contain caffeine or alcohol. °· Avoid certain medicines, especially medicines that are used for breathing problems. °· Avoid certain herbs and herbal medicines, such as those that contain ephedra or ginseng. °· Do not use illegal drugs, such as cocaine and amphetamines. °· Do not smoke. °· Manage your high blood pressure. °SEEK MEDICAL CARE IF: °· You notice a change in the rate, rhythm, or strength of your heartbeat. °· You are taking an anticoagulant and you notice increased bruising. °· You tire more easily when you exercise or exert yourself. °SEEK IMMEDIATE MEDICAL CARE IF: °· You have chest pain, abdominal pain, sweating, or weakness. °· You feel nauseous. °· You notice blood in your vomit, bowel movement, or urine. °· You have shortness of breath. °· You suddenly have swollen feet and ankles. °· You feel dizzy. °· You have sudden weakness or numbness of the face, arm, or leg, especially on one side of the body. °· You have trouble speaking,  trouble understanding, or both (aphasia). °· Your face or your eyelid droops on one side. °These symptoms may represent a serious problem that is an emergency. Do not wait to see if the symptoms will go away. Get medical help right away. Call your local emergency services (911 in the [place] Do not drive yourself to the hospital. °  °This information is not intended to replace advice given to you by your health care provider. Make sure you discuss any questions you have with your health care provider. °  °Document Released: 07/09/2005 Document Revised: 03/30/2015 Document Reviewed: 11/03/2014 °Elsevier Interactive Patient Education ©2016 [employer] ° ° °Nonspecific Chest Pain  °Chest pain can be caused by many different conditions. There is always a chance that your pain could be related to something serious, such as a heart attack or a blood clot in your lungs. Chest pain can also be caused by conditions that are not life-threatening. If you have chest pain, it is very important to follow up with your health care provider. °CAUSES  °Chest pain can be caused by: °· Heartburn. °· Pneumonia or bronchitis. °· Anxiety or stress. °· Inflammation around your heart (pericarditis) or lung (pleuritis or pleurisy). °· A blood clot in your lung. °· A collapsed lung (pneumothorax). It can develop suddenly on its own (spontaneous pneumothorax) or from trauma to the chest. °· Shingles infection (varicella-zoster virus). °· Heart attack. °· Damage to the bones, muscles, and cartilage that make up your chest wall. This can include: °¨ Bruised bones due to injury. °¨   Strained muscles or cartilage due to frequent or repeated coughing or overwork. °¨ Fracture to one or more ribs. °¨ Sore cartilage due to inflammation (costochondritis). °RISK FACTORS  °Risk factors for chest pain may include: °· Activities that increase your risk for trauma or injury to your chest. °· Respiratory infections or conditions that cause frequent  coughing. °· Medical conditions or overeating that can cause heartburn. °· Heart disease or family history of heart disease. °· Conditions or health behaviors that increase your risk of developing a blood clot. °· Having had chicken pox (varicella zoster). °SIGNS AND SYMPTOMS °Chest pain can feel like: °· Burning or tingling on the surface of your chest or deep in your chest. °· Crushing, pressure, aching, or squeezing pain. °· Dull or sharp pain that is worse when you move, cough, or take a deep breath. °· Pain that is also felt in your back, neck, shoulder, or arm, or pain that spreads to any of these areas. °Your chest pain may come and go, or it may stay constant. °DIAGNOSIS °Lab tests or other studies may be needed to find the cause of your pain. Your health care provider may have you take a test called an ambulatory ECG (electrocardiogram). An ECG records your heartbeat patterns at the time the test is performed. You may also have other tests, such as: °· Transthoracic echocardiogram (TTE). During echocardiography, sound waves are used to create a picture of all of the heart structures and to look at how blood flows through your heart. °· Transesophageal echocardiogram (TEE). This is a more advanced imaging test that obtains images from inside your body. It allows your health care provider to see your heart in finer detail. °· Cardiac monitoring. This allows your health care provider to monitor your heart rate and rhythm in real time. °· Holter monitor. This is a portable device that records your heartbeat and can help to diagnose abnormal heartbeats. It allows your health care provider to track your heart activity for several days, if needed. °· Stress tests. These can be done through exercise or by taking medicine that makes your heart beat more quickly. °· Blood tests. °· Imaging tests. °TREATMENT  °Your treatment depends on what is causing your chest pain. Treatment may include: °· Medicines. These may  include: °¨ Acid blockers for heartburn. °¨ Anti-inflammatory medicine. °¨ Pain medicine for inflammatory conditions. °¨ Antibiotic medicine, if an infection is present. °¨ Medicines to dissolve blood clots. °¨ Medicines to treat coronary artery disease. °· Supportive care for conditions that do not require medicines. This may include: °¨ Resting. °¨ Applying heat or cold packs to injured areas. °¨ Limiting activities until pain decreases. °HOME CARE INSTRUCTIONS °· If you were prescribed an antibiotic medicine, finish it all even if you start to feel better. °· Avoid any activities that bring on chest pain. °· Do not use any tobacco products, including cigarettes, chewing tobacco, or electronic cigarettes. If you need help quitting, ask your health care provider. °· Do not drink alcohol. °· Take medicines only as directed by your health care provider. °· Keep all follow-up visits as directed by your health care provider. This is important. This includes any further testing if your chest pain does not go away. °· If heartburn is the cause for your chest pain, you may be told to keep your head raised (elevated) while sleeping. This reduces the chance that acid will go from your stomach into your esophagus. °· Make lifestyle changes as directed by your health   care provider. These may include: °¨ Getting regular exercise. Ask your health care provider to suggest some activities that are safe for you. °¨ Eating a heart-healthy diet. A registered dietitian can help you to learn healthy eating options. °¨ Maintaining a healthy weight. °¨ Managing diabetes, if necessary. °¨ Reducing stress. °SEEK MEDICAL CARE IF: °· Your chest pain does not go away after treatment. °· You have a rash with blisters on your chest. °· You have a fever. °SEEK IMMEDIATE MEDICAL CARE IF:  °· Your chest pain is worse. °· You have an increasing cough, or you cough up blood. °· You have severe abdominal pain. °· You have severe weakness. °· You  faint. °· You have chills. °· You have sudden, unexplained chest discomfort. °· You have sudden, unexplained discomfort in your arms, back, neck, or jaw. °· You have shortness of breath at any time. °· You suddenly start to sweat, or your skin gets clammy. °· You feel nauseous or you vomit. °· You suddenly feel light-headed or dizzy. °· Your heart begins to beat quickly, or it feels like it is skipping beats. °These symptoms may represent a serious problem that is an emergency. Do not wait to see if the symptoms will go away. Get medical help right away. Call your local emergency services (911 in the [place] Do not drive yourself to the hospital. °  °This information is not intended to replace advice given to you by your health care provider. Make sure you discuss any questions you have with your health care provider. °  °Document Released: 04/18/2005 Document Revised: 07/30/2014 Document Reviewed: 02/12/2014 °Elsevier Interactive Patient Education ©2016 [employer] Inc. ° °

## 2015-12-12 NOTE — Assessment & Plan Note (Signed)
Tolerating anticoagulation, samples provided

## 2015-12-12 NOTE — Assessment & Plan Note (Signed)
She did report some chest tightness in the setting of rapid atrial fibrillation Very low coronary calcium score performed recently Negative cardiac enzymes 2 Recommended to her that if she starts to have any further chest pain symptoms that she call our office for further testing   Total encounter time more than 25 minutes  Greater than 50% was spent in counseling and coordination of care with the patient

## 2015-12-12 NOTE — Assessment & Plan Note (Signed)
Paroxysmal atrial fibrillation , Last episode last night lasting for hours Long discussion concerning various treatment options in the details of last night She did not take extra flecainide or any of her emergency pills such as Inderal or diltiazem Recommended if she has recurrent symptoms that she take extra flecainide and diltiazem, even propranolol If she starts to have more episodes, would increase flecainide up to 100 mg twice a day

## 2015-12-12 NOTE — Assessment & Plan Note (Signed)
We have encouraged her to continue to work on weaning her cigarettes and smoking cessation. She will continue to work on this and does not want any assistance with chantix.  

## 2015-12-12 NOTE — Patient Instructions (Addendum)
If you convert to atrial fib, Take extra flecainide and diltiazem If no relief in one hour, take extra flecainide and inderal  We will check LFT and lipids  Please call us if you have new issues that need to be addressed before your next appt.  Your physician wants you to follow-up in: 6 months.  You will receive a reminder letter in the mail two months in advance. If you don't receive a letter, please call our office to schedule the follow-up appointment.

## 2015-12-12 NOTE — Progress Notes (Signed)
Patient ID: [name], female    DOB: 1945-09-27, 70 y.o.   MRN: [id number]  HPI Comments: [name] is a 70 year old woman with a history of palpitations and tachycardia, paroxysmal atrial fibrillation who presents for routine followup of her atrial fibrillation. In the past, she has had Several episodes of atrial fibrillation, one requiring cardioversion. Previous smoking history, stopped in 2012 CT coronary calcium score showing mid LAD coronary artery disease, score of 50  In follow-up, she reports developing atrial fibrillation last night Heart rate up to 160 bpm She went to the emergency room, converted to normal sinus rhythm on her own after around 4 hours She did not take extra flecainide Prior to that was not having many episodes Denies any recent stressors that could have contributed to her symptoms although she reports that she is not sleeping well in general. Xanax does not seem to work as well anymore  EKGs from the hospital last night reviewed showing atrial fibrillation with ventricular rate 160 bpm Follow-up EKG with heart rate in the 120 range showing atrial fibrillation Final EKG showing normal sinus rhythm Emergency room records reviewed, she was not given any medications for rhythm control for the patient (" they were about to") She did have some mild chest tightness when symptoms first presented. Cardiac enzymes were negative 2  Recent CT coronary calcium score of 50, most of her disease in the mid LAD Mild aorta atherosclerosis Images reviewed with her in the office in detail  EKG on today's visit shows normal sinus rhythm with rate 58 bpm, no significant ST or T-wave changes  Other past medical history Previously reported having diverticulitis, ulcerative colitis. Given prednisone Tolerating anticoagulation  No recent lipid panel available.    echocardiogram done at [facility] 01/17/2012 - Left ventricle: The cavity size was normal. Wall thickness was  normal. Systolic function was normal. The estimated   ejection fraction was in the range of 55% to 60%. Wall motion was normal; there were no regional wall motion abnormalities. - Mitral valve: Mild regurgitation.       Allergies  Allergen Reactions  . Latex   . Adhesive [Tape] Rash  . Aspirin Other (See Comments)    Aggravates Acid Reflux  . Prednisone Palpitations    Made pts heart race    Current Outpatient Prescriptions on File Prior to Visit  Medication Sig Dispense Refill  . Coenzyme Q10 (COQ10 PO) Take 1 tablet by mouth daily.    Marland Kitchen diltiazem (CARDIZEM) 30 MG tablet Take 1 tablet (30 mg total) by mouth 3 (three) times daily as needed (for breakthrough atrial fibrillation). 90 tablet 4  . famotidine (PEPCID) 40 MG tablet Take 1 tablet (40 mg total) by mouth daily. 30 tablet 0  . flecainide (TAMBOCOR) 50 MG tablet TAKE ONE TABLET BY MOUTH TWICE DAILY (Patient taking differently: Take 50 mg by mouth once. Pt takes bid only if needed) 180 tablet 3  . metoprolol succinate (TOPROL-XL) 25 MG 24 hr tablet Take 1 tablet (25 mg total) by mouth daily. 90 tablet 4  . propranolol (INDERAL) 20 MG tablet Take 1 tablet (20 mg total) by mouth 3 (three) times daily as needed (for breakthrough atrial fibrillation). (Patient taking differently: Take 20 mg by mouth 3 (three) times daily as needed (for breakthrough atrial fibrillation). Pt uses for emergency use only) 90 tablet 4  . rivaroxaban (XARELTO) 20 MG TABS tablet Take 1 tablet (20 mg total) by mouth daily. 30 tablet 11  .  rosuvastatin (CRESTOR) 5 MG tablet Take 1 tablet (5 mg total) by mouth daily. (Patient taking differently: Take 5 mg by mouth every other day. ) 90 tablet 3   No current facility-administered medications on file prior to visit.    Past Medical History  Diagnosis Date  . Mitral valve disorders     a. 12/2011 Echo: EF 55-60%, NRWMA, No evidence of MVP, Mild MR  . Collagenous colitis   . Osteoporosis   . GERD  (gastroesophageal reflux disease)   . IBS (irritable bowel syndrome)   . Internal hemorrhoids 2010    Colon-Dr. Vira Agar   . Schatzki's ring 2010     EGD- Dr. Vira Agar   . Hiatal hernia 2004  . Cataract   . Hyperlipidemia   . Midsternal chest pain     a. 01/2011 Ex Mv: EF 68%, No ischemia.  . Squamous cell carcinoma of skin     multiple  . Heart murmur   . Atrial fibrillation ([place])     a. diagnosed 12/2011  . Migraine 01/16/12    "none now for several years"  . Arthritis     "fingers; right shoulder"  . Kidney stones   . Broken ribs   . Esophageal candidiasis ([place])   . Ulcerative colitis ([place])   . Diverticulosis     Past Surgical History  Procedure Laterality Date  . Upper gastrointestinal endoscopy  Multiple    w/dilation, hiatal hernia  . Colonoscopy  Multiple  . Vaginal hysterectomy  1972  . Tonsillectomy and adenoidectomy  1975  . Appendectomy  1963  . Ovarian cyst removal  1990's  . Shoulder arthroscopy w/ rotator cuff repair Right ~ 2005  . Breast cyst aspiration Bilateral     "several; both sides"  . Squamous cell carcinoma excision      "10-12 removed; all over my body - nose, predominately legs"  . Cystoscopy w/ stone manipulation  1980's  . Cardioversion  01/16/2012    Procedure: CARDIOVERSION;  Surgeon: Evans Lance, MD;  Location: [facility] OR;  Service: Cardiovascular;  Laterality: N/A;  . Leg surgery      skin cancer removal    Social History  reports that she quit smoking about 3 years ago. Her smoking use included Cigarettes. She has a 4 pack-year smoking history. She has never used smokeless tobacco. She reports that she does not drink alcohol or use illicit drugs.  Family History family history includes Alzheimer's disease in her mother; Diabetes in her father; Heart disease in her father; Irritable bowel syndrome in her father; Kidney disease in her father. There is no history of Colon cancer.   Review of Systems  Constitutional: Negative.    Respiratory: Positive for chest tightness and shortness of breath.   Cardiovascular: Positive for chest pain.  Gastrointestinal: Negative.   Musculoskeletal: Negative.   Neurological: Negative.   Hematological: Negative.   Psychiatric/Behavioral: Negative.   All other systems reviewed and are negative.   BP 122/62 mmHg  Pulse 58  Ht 5\' 8"  (1.727 m)  Wt 144 lb 8 oz (65.545 kg)  BMI 21.98 kg/m2  Physical Exam  Constitutional: She is oriented to person, place, and time. She appears well-developed and well-nourished.  HENT:  Head: Normocephalic.  Nose: Nose normal.  Mouth/Throat: Oropharynx is clear and moist.  Eyes: Conjunctivae are normal. Pupils are equal, round, and reactive to light.  Neck: Normal range of motion. Neck supple. No JVD present.  Cardiovascular: Normal rate, regular rhythm, S1 normal, S2  normal, normal heart sounds and intact distal pulses.  Exam reveals no gallop and no friction rub.   No murmur heard. Pulmonary/Chest: Effort normal and breath sounds normal. No respiratory distress. She has no wheezes. She has no rales. She exhibits no tenderness.  Abdominal: Soft. Bowel sounds are normal. She exhibits no distension. There is no tenderness.  Musculoskeletal: Normal range of motion. She exhibits no edema or tenderness.  Lymphadenopathy:    She has no cervical adenopathy.  Neurological: She is alert and oriented to person, place, and time. Coordination normal.  Skin: Skin is warm and dry. No rash noted. No erythema.  Psychiatric: She has a normal mood and affect. Her behavior is normal. Judgment and thought content normal.    Assessment and Plan  Nursing note and vitals reviewed.

## 2016-01-04 DIAGNOSIS — L821 Other seborrheic keratosis: Secondary | ICD-10-CM | POA: Diagnosis not present

## 2016-01-04 DIAGNOSIS — D229 Melanocytic nevi, unspecified: Secondary | ICD-10-CM | POA: Diagnosis not present

## 2016-01-04 DIAGNOSIS — D485 Neoplasm of uncertain behavior of skin: Secondary | ICD-10-CM | POA: Diagnosis not present

## 2016-01-04 DIAGNOSIS — L570 Actinic keratosis: Secondary | ICD-10-CM | POA: Diagnosis not present

## 2016-01-04 DIAGNOSIS — L578 Other skin changes due to chronic exposure to nonionizing radiation: Secondary | ICD-10-CM | POA: Diagnosis not present

## 2016-01-04 DIAGNOSIS — B009 Herpesviral infection, unspecified: Secondary | ICD-10-CM | POA: Diagnosis not present

## 2016-01-04 DIAGNOSIS — L812 Freckles: Secondary | ICD-10-CM | POA: Diagnosis not present

## 2016-01-04 DIAGNOSIS — D1800 Hemangioma unspecified site: Secondary | ICD-10-CM | POA: Diagnosis not present

## 2016-01-04 DIAGNOSIS — Z85828 Personal history of other malignant neoplasm of skin: Secondary | ICD-10-CM | POA: Diagnosis not present

## 2016-01-04 DIAGNOSIS — Z1283 Encounter for screening for malignant neoplasm of skin: Secondary | ICD-10-CM | POA: Diagnosis not present

## 2016-01-04 DIAGNOSIS — L820 Inflamed seborrheic keratosis: Secondary | ICD-10-CM | POA: Diagnosis not present

## 2016-01-04 DIAGNOSIS — L819 Disorder of pigmentation, unspecified: Secondary | ICD-10-CM | POA: Diagnosis not present

## 2016-01-13 ENCOUNTER — Other Ambulatory Visit: Payer: Self-pay | Admitting: Cardiovascular Disease

## 2016-02-01 ENCOUNTER — Telehealth: Payer: Self-pay | Admitting: Cardiovascular Disease

## 2016-02-01 NOTE — Telephone Encounter (Signed)
Left voicemail message for patient to call back.

## 2016-02-01 NOTE — Telephone Encounter (Signed)
Pt staets her handicap registration expired on 7/1. She will need Dr. Rockey Situ to renew it. Please call.

## 2016-02-01 NOTE — Telephone Encounter (Signed)
Patient states that she is going to drop off form to front office for Dr. Rockey Situ to sign. She is aware he is out of town at this time. Let her know that someone would call once it is ready for pick up. She verbalized understanding and had no further questions.

## 2016-02-13 ENCOUNTER — Telehealth: Payer: Self-pay

## 2016-02-13 ENCOUNTER — Encounter: Payer: Self-pay | Admitting: Internal Medicine

## 2016-02-13 ENCOUNTER — Ambulatory Visit (INDEPENDENT_AMBULATORY_CARE_PROVIDER_SITE_OTHER): Payer: Medicare Other | Admitting: Internal Medicine

## 2016-02-13 VITALS — BP 112/60 | HR 64 | Ht 68.0 in | Wt 145.8 lb

## 2016-02-13 DIAGNOSIS — R1011 Right upper quadrant pain: Secondary | ICD-10-CM | POA: Diagnosis not present

## 2016-02-13 DIAGNOSIS — K589 Irritable bowel syndrome without diarrhea: Secondary | ICD-10-CM | POA: Diagnosis not present

## 2016-02-13 DIAGNOSIS — Z7901 Long term (current) use of anticoagulants: Secondary | ICD-10-CM | POA: Diagnosis not present

## 2016-02-13 DIAGNOSIS — R1310 Dysphagia, unspecified: Secondary | ICD-10-CM | POA: Diagnosis not present

## 2016-02-13 MED ORDER — FAMOTIDINE 40 MG PO TABS: 40.0000 mg | ORAL_TABLET | Freq: Two times a day (BID) | ORAL | 11 refills | Status: DC

## 2016-02-13 NOTE — Progress Notes (Signed)
Subjective:    Patient ID: [name], female    DOB: June 27, 1946, 70 y.o.   MRN: [id number]  CC: abdominal pain and diarrhea  HPI [name] is a 70 y.o. female with a history of IBS, GERD with strictures, collagenous colitis, and AF on xarelto, presenting to the office with abdominal pain and diarrhea. She states for the past week she has had intermittent RUQ abdominal pain that radiates into her back, classified as sharp and stabbing. She has had a some episodes of awaking with the pain in the pas few days and does not associated it with any food ingestion. Pepcid helps resolve the pain for a little while but will return.  In addition to the pain, for the past year she has had increasing dysphagia with food and liquids, with episodes of regurgitation. She senses that food gets stuck at the level of the manubrium.  She had her last esophageal stretch in 2015 and states "I have to get it stretched every couple of years". Also for the past year she has had intermitient diarrhea described as loose to watery,  that occurs more so when in public. Does also have periods of normal stools. States that she can eat and 0-30 minutes later have urgency to defecate.Imodium helps resolve her diarrhea episodes, but notes no hematochezia, vomiting, fevers, chills, constipation. She expresses concerns about going to [place] next month and having symptoms.   Allergies  Allergen Reactions  . Latex   . Adhesive [Tape] Rash  . Aspirin Other (See Comments)    Aggravates Acid Reflux  . Prednisone Palpitations    Made pts heart race   Outpatient Medications Prior to Visit  Medication Sig Dispense Refill  . Coenzyme Q10 (COQ10 PO) Take 1 tablet by mouth daily.    Marland Kitchen diltiazem (CARDIZEM) 30 MG tablet Take 1 tablet (30 mg total) by mouth 3 (three) times daily as needed (for breakthrough atrial fibrillation). 90 tablet 4  . flecainide (TAMBOCOR) 50 MG tablet TAKE ONE TABLET BY MOUTH TWICE DAILY (Patient taking  differently: Take 50 mg by mouth once. Pt takes bid only if needed) 180 tablet 3  . metoprolol succinate (TOPROL-XL) 25 MG 24 hr tablet Take 1 tablet (25 mg total) by mouth daily. 90 tablet 4  . propranolol (INDERAL) 20 MG tablet Take 1 tablet (20 mg total) by mouth 3 (three) times daily as needed (for breakthrough atrial fibrillation). (Patient taking differently: Take 20 mg by mouth 3 (three) times daily as needed (for breakthrough atrial fibrillation). Pt uses for emergency use only) 90 tablet 4  . rivaroxaban (XARELTO) 20 MG TABS tablet Take 1 tablet (20 mg total) by mouth daily. 30 tablet 11  . rosuvastatin (CRESTOR) 5 MG tablet TAKE 1 TABLET BY MOUTH DAILY. 90 tablet 3  . famotidine (PEPCID) 40 MG tablet Take 1 tablet (40 mg total) by mouth daily. 30 tablet 0   No facility-administered medications prior to visit.    Past Medical History:  Diagnosis Date  . Arthritis    "fingers; right shoulder"  . Atrial fibrillation ([place])    a. diagnosed 12/2011  . Broken ribs   . Cataract   . Collagenous colitis   . Diverticulosis   . Esophageal candidiasis ([place])   . GERD (gastroesophageal reflux disease)   . Heart murmur   . Hiatal hernia 2004  . Hyperlipidemia   . IBS (irritable bowel syndrome)   . Internal hemorrhoids 2010   Colon-Dr. Vira Agar   .  Kidney stones   . Midsternal chest pain    a. 01/2011 Ex Mv: EF 68%, No ischemia.  . Migraine 01/16/12   "none now for several years"  . Mitral valve disorders    a. 12/2011 Echo: EF 55-60%, NRWMA, No evidence of MVP, Mild MR  . Osteoporosis   . Schatzki's ring 2010    EGD- Dr. Vira Agar   . Squamous cell carcinoma of skin    multiple  . Ulcerative colitis [facility])    Past Surgical History:  Procedure Laterality Date  . APPENDECTOMY  1963  . BREAST CYST ASPIRATION Bilateral    "several; both sides"  . CARDIOVERSION  01/16/2012   Procedure: CARDIOVERSION;  Surgeon: Evans Lance, MD;  Location: [place];  Service: Cardiovascular;  Laterality:  N/A;  . COLONOSCOPY  Multiple  . CYSTOSCOPY W/ STONE MANIPULATION  1980's  . LEG SURGERY     skin cancer removal  . OVARIAN CYST REMOVAL  1990's  . SHOULDER ARTHROSCOPY W/ ROTATOR CUFF REPAIR Right ~ 2005  . SQUAMOUS CELL CARCINOMA EXCISION     "10-12 removed; all over my body - nose, predominately legs"  . TONSILLECTOMY AND ADENOIDECTOMY  1975  . UPPER GASTROINTESTINAL ENDOSCOPY  Multiple   w/dilation, hiatal hernia  . VAGINAL HYSTERECTOMY  1972   Social History   Social History  . Marital status: Married    Spouse name: N/A  . Number of children: N/A  . Years of education: N/A   Occupational History  . retired    Social History Main Topics  . Smoking status: Former Smoker    Packs/day: 0.10    Years: 40.00    Types: Cigarettes    Quit date: 01/16/2012  . Smokeless tobacco: Never Used  . Alcohol use No     Comment: rare  . Drug use: No  . Sexual activity: Not Asked   Other Topics Concern  . None   Social History Narrative   Lives in [place] with husband.   She is retired   One daughter, 2 grandsons   Spends a fair amount of leisure time at [place] History  Problem Relation Age of Onset  . Heart disease Father     A Fib and CHF - died in Mid 60  . Diabetes Father   . Kidney disease Father   . Irritable bowel syndrome Father   . Alzheimer's disease Mother     Died in [place] 11's.  . Colon cancer Neg Hx      Review of Systems  See HPI, all other systems negative     Objective:   Physical Exam  @BP  112/60 (BP Location: Left Arm, Patient Position: Sitting, Cuff Size: Normal)   Pulse 64   Ht 5\' 8"  (1.727 m)   Wt 145 lb 12.8 oz (66.1 kg)   BMI 22.17 kg/m @  General:  NAD Eyes:   anicteric Lungs:  clear Heart::  S1S2 no rubs, murmurs or gallops Abdomen:  soft and mildly diffuse tenderness with peak tenderness in the RUQ not worse with abdominal   muscle contraction , BS+,  Ext:   no edema, cyanosis or clubbing   Data Reviewed:  EGD and  Colonoscopy from 03/30/14 Labs from 12/11/15 HIDA scan from 06/29/11 with negative findings     Assessment & Plan:   Encounter Diagnoses  Name Primary?  Marland Kitchen Dysphagia Yes  . RUQ pain   . IBS (irritable bowel syndrome)   . Long term (current)  use of anticoagulants - Xarelto for A fib    Based on her long history esophageal dilation in the presence of dysphagia it will be reasonable to schedule her for an EGD to evaluate for strictures, with the plan to dilate as in past since it helps. She will hold Xarelto taking her last dose tonight in plans for EGD on Thursday 02/16/16.  Will hold Xarelto 1-2  days prior to endoscopic procedures - will instruct when and how to resume after procedure. Benefits and risks of procedure explained including risks of bleeding, perforation, infection, missed lesions, reactions to medications and possible need for hospitalization and surgery for complications. Additional rare but real risk of stroke or other vascular clotting events off xarelto also explained and need to seek urgent help if any signs of these problems occur. Will communicate by phone or EMR with patient's  prescribing provider to confirm that holding Xarelto is reasonable in this case.     Patient present with RUQ pain and tenderness to palpation. Due to her presentation we have ordered an ultrasound abdomen. However, based on the history of not being associated with food makes gall bladder less likely. She has had similar symptoms in the past and had [place] +  HIDA scan with negative findings. Likely gastric in nature as it improves with pepcid. Have asked her to take her pepcid twice a day and will await the results of the EGD and ultrasound.   Diarrhea has been chronic for her and given her history of IBS in addtion the episodes occur more so in the public setting this is likely her IBS flaring up. She says imodium improves her diarrhea and will continue to use as needed.   Grayland Ormond PA-S  I have  seen the patient with Mr. Venetia Maxon and he has served as a Education administrator.  I appreciate the opportunity to care for this patient. [id number], Timoteo Gaul, MD

## 2016-02-13 NOTE — Telephone Encounter (Signed)
I have replied to the sender

## 2016-02-13 NOTE — Assessment & Plan Note (Signed)
Will hold 24-36 hrs priorto EGD/dili - she is aware of stroke risk Will clarify w/ cardiologist also

## 2016-02-13 NOTE — Patient Instructions (Addendum)
  You have been scheduled for an endoscopy. Please follow written instructions given to you at your visit today. If you use inhalers (even only as needed), please bring them with you on the day of your procedure.  Hold your xarelto starting 02/14/16 per Dr Carlean Purl.   Increase your Pepcid to twice daily.    I appreciate the opportunity to care for you. [name], MD, [facility]

## 2016-02-13 NOTE — Telephone Encounter (Signed)
[facility] [address]  [place] [address]   RE: [name] DOB: 1945/11/11 MRN: [id number]   Dear Esmond Plants MD,    We have scheduled the above patient for an endoscopic procedure. Our records show that she is on anticoagulation therapy.   Please advise as to how long the patient may come off her therapy of xarelto prior to the EGD/DIL procedure, which is scheduled for 02/16/16.  Please fax back/ or route the completed form to  [place], [place] at [phone].   Thank you for your quick attention, Leveah is going to [place] soon and we want to get this done prior to her leaving.  Sincerely,    [name], MD, [facility]

## 2016-02-14 NOTE — Telephone Encounter (Signed)
Earnstine informed of time to hold xarelto and verbalized understanding.

## 2016-02-14 NOTE — Progress Notes (Signed)
Jessamine informed of time to hold the xarelto and verbalized understanding.

## 2016-02-16 ENCOUNTER — Telehealth: Payer: Self-pay | Admitting: Internal Medicine

## 2016-02-16 ENCOUNTER — Ambulatory Visit (AMBULATORY_SURGERY_CENTER): Payer: Medicare Other | Admitting: Internal Medicine

## 2016-02-16 ENCOUNTER — Encounter: Payer: Self-pay | Admitting: Internal Medicine

## 2016-02-16 VITALS — BP 114/59 | HR 54 | Temp 97.3°F | Resp 11 | Ht 68.0 in | Wt 145.0 lb

## 2016-02-16 DIAGNOSIS — I4891 Unspecified atrial fibrillation: Secondary | ICD-10-CM | POA: Diagnosis not present

## 2016-02-16 DIAGNOSIS — K219 Gastro-esophageal reflux disease without esophagitis: Secondary | ICD-10-CM | POA: Diagnosis not present

## 2016-02-16 DIAGNOSIS — R1310 Dysphagia, unspecified: Secondary | ICD-10-CM | POA: Diagnosis present

## 2016-02-16 DIAGNOSIS — I10 Essential (primary) hypertension: Secondary | ICD-10-CM | POA: Diagnosis not present

## 2016-02-16 MED ORDER — SODIUM CHLORIDE 0.9 % IV SOLN: INTRAVENOUS | Status: DC

## 2016-02-16 NOTE — Patient Instructions (Addendum)
Things look ok - no damage or obvious narrow areas. I did dilate the esophagus as planned. Restart Xarelto tomorrow - take pepcid twice a day regularly. Have a great trip to [place]!  I appreciate the opportunity to care for you. Gatha Mayer, MD, [facility]  Discharge instructions given. Handout on a dilatation diet. Resume previous medications. Resume Xarelto at prior dose tomorrow. YOU HAD AN ENDOSCOPIC PROCEDURE TODAY AT [facility]:   Refer to the procedure report that was given to you for any specific questions about what was found during the examination.  If the procedure report does not answer your questions, please call your gastroenterologist to clarify.  If you requested that your care partner not be given the details of your procedure findings, then the procedure report has been included in a sealed envelope for you to review at your convenience later.  YOU SHOULD EXPECT: Some feelings of bloating in the abdomen. Passage of more gas than usual.  Walking can help get rid of the air that was put into your GI tract during the procedure and reduce the bloating. If you had a lower endoscopy (such as a colonoscopy or flexible sigmoidoscopy) you may notice spotting of blood in your stool or on the toilet paper. If you underwent a bowel prep for your procedure, you may not have a normal bowel movement for a few days.  Please Note:  You might notice some irritation and congestion in your nose or some drainage.  This is from the oxygen used during your procedure.  There is no need for concern and it should clear up in a day or so.  SYMPTOMS TO REPORT IMMEDIATELY:    Following upper endoscopy (EGD)  Vomiting of blood or coffee ground material  New chest pain or pain under the shoulder blades  Painful or persistently difficult swallowing  New shortness of breath  Fever of 100F or higher  Black, tarry-looking stools  For urgent or emergent issues, a  gastroenterologist can be reached at any hour by calling [phone].   DIET: Your first meal following the procedure should be a small meal and then it is ok to progress to your normal diet. Heavy or fried foods are harder to digest and may make you feel nauseous or bloated.  Likewise, meals heavy in dairy and vegetables can increase bloating.  Drink plenty of fluids but you should avoid alcoholic beverages for 24 hours.  ACTIVITY:  You should plan to take it easy for the rest of today and you should NOT DRIVE or use heavy machinery until tomorrow (because of the sedation medicines used during the test).    FOLLOW UP: Our staff will call the number listed on your records the next business day following your procedure to check on you and address any questions or concerns that you may have regarding the information given to you following your procedure. If we do not reach you, we will leave a message.  However, if you are feeling well and you are not experiencing any problems, there is no need to return our call.  We will assume that you have returned to your regular daily activities without incident.  If any biopsies were taken you will be contacted by phone or by letter within the next 1-3 weeks.  Please call us at [phone] if you have not heard about the biopsies in 3 weeks.    SIGNATURES/CONFIDENTIALITY: You and/or your care partner have signed paperwork which will  be entered into your electronic medical record.  These signatures attest to the fact that that the information above on your After Visit Summary has been reviewed and is understood.  Full responsibility of the confidentiality of this discharge information lies with you and/or your care-partner. 

## 2016-02-16 NOTE — Telephone Encounter (Signed)
Spoke with pt and informed her that report states to restart Xarelto tomorrow.

## 2016-02-16 NOTE — Progress Notes (Signed)
Report to PACU, RN, vss, BBS= Clear.  

## 2016-02-16 NOTE — Op Note (Signed)
[facility] Patient Name: [name] Procedure Date: 02/16/2016 7:37 AM MRN: [id number] Endoscopist: Gatha Mayer , MD Age: 70 Referring MD:  Date of Birth: 03-May-1946 Gender: Female Account #: [id number] Procedure:                Upper GI endoscopy Indications:              Epigastric abdominal pain, Dysphagia Medicines:                Propofol per Anesthesia, Monitored Anesthesia Care Procedure:                Pre-Anesthesia Assessment:                           - Prior to the procedure, a History and Physical                            was performed, and patient medications and                            allergies were reviewed. The patient's tolerance of                            previous anesthesia was also reviewed. The risks                            and benefits of the procedure and the sedation                            options and risks were discussed with the patient.                            All questions were answered, and informed consent                            was obtained. Prior Anticoagulants: The patient                            last took Xarelto (rivaroxaban) 2 days prior to the                            procedure. ASA Grade Assessment: II - A patient                            with mild systemic disease. After reviewing the                            risks and benefits, the patient was deemed in                            satisfactory condition to undergo the procedure.                           After obtaining informed consent, the endoscope was  passed under direct vision. Throughout the                            procedure, the patient's blood pressure, pulse, and                            oxygen saturations were monitored continuously. The                            Model GIF-HQ190 [id number]) scope was introduced                            through the mouth, and advanced to the second part           of duodenum. The upper GI endoscopy was                            accomplished without difficulty. The patient                            tolerated the procedure well. Scope In: Scope Out: Findings:                 The esophagus was normal. The scope was withdrawn.                            Dilation was performed with a Maloney dilator with                            no resistance at 75 Fr. Estimated blood loss: none.                           The stomach was normal.                           The examined duodenum was normal. Complications:            No immediate complications. Estimated Blood Loss:     Estimated blood loss: none. Impression:               - Normal esophagus. Dilated.                           - Normal stomach.                           - Normal examined duodenum.                           - No specimens collected. Recommendation:           - Patient has a contact number available for                            emergencies. The signs and symptoms of potential                            delayed complications were  discussed with the                            patient. Return to normal activities tomorrow.                            Written discharge instructions were provided to the                            patient.                           - Clear liquids x 1 hour then soft foods rest of                            day. Start prior diet tomorrow.                           - Continue present medications.                           - Resume Xarelto (rivaroxaban) at prior dose                            tomorrow.                           - Use Pepcid (famotidine) 40 mg PO BID. Gatha Mayer, MD 02/16/2016 7:55:21 AM This report has been signed electronically.

## 2016-02-16 NOTE — Progress Notes (Signed)
Called to room to assist during endoscopic procedure.  Patient ID and intended procedure confirmed with present staff. Received instructions for my participation in the procedure from the performing physician.  

## 2016-02-17 ENCOUNTER — Telehealth: Payer: Self-pay | Admitting: *Deleted

## 2016-02-17 NOTE — Telephone Encounter (Signed)
  Follow up Call-  Call back number 02/16/2016 03/30/2014  Post procedure Call Back phone  # [phone]  Permission to leave phone message Yes Yes  Some recent data might be hidden     Patient questions:  Do you have a fever, pain , or abdominal swelling? No. Pain Score  0 *  Have you tolerated food without any problems? No.  Have you been able to return to your normal activities? Yes.    Do you have any questions about your discharge instructions: Diet   No. Medications  No. Follow up visit  Yes.    Do you have questions or concerns about your Care? No.  Actions: * If pain score is 4 or above: No action needed, pain <4.   FYI, Pt. States that she had an egg sandwich for dinner Last night.  She had some problems getting it down. She stated that the bread was probably too much and Felt that this was a bad decision for this meal.  She Took a pill last evening and in a while she brought it Back up.  States she feels like it is better today. Advised to call if problem becomes worse.

## 2016-02-28 ENCOUNTER — Telehealth: Payer: Self-pay | Admitting: Internal Medicine

## 2016-02-28 DIAGNOSIS — R1010 Upper abdominal pain, unspecified: Secondary | ICD-10-CM

## 2016-02-28 DIAGNOSIS — R10819 Abdominal tenderness, unspecified site: Secondary | ICD-10-CM

## 2016-02-28 NOTE — Telephone Encounter (Signed)
Please have her do Ct abdomen with contrast to evaluate upper abdominal pain and tenderness I do not think we need a pelvis CT

## 2016-02-28 NOTE — Telephone Encounter (Signed)
Patient with continued RUQ pain and epigastric pain that is also radiating to her left side as well..  She states that she is taking BID pepcid with no relief.  She is asking what the next step is?  Please advise she is concerned that she is leaving on a cruise next week and she is still having symptoms.

## 2016-02-28 NOTE — Telephone Encounter (Signed)
Left message for patient to call back  CT scheduled for 03/01/16 9:30 at [facility].  Patient will need to come in for labs tomorrow. Will only need one bottle of contrast at 8:30

## 2016-02-29 ENCOUNTER — Other Ambulatory Visit (INDEPENDENT_AMBULATORY_CARE_PROVIDER_SITE_OTHER): Payer: Medicare Other

## 2016-02-29 DIAGNOSIS — R10819 Abdominal tenderness, unspecified site: Secondary | ICD-10-CM | POA: Diagnosis not present

## 2016-02-29 DIAGNOSIS — R1010 Upper abdominal pain, unspecified: Secondary | ICD-10-CM

## 2016-02-29 LAB — BUN: BUN: 18 mg/dL (ref 6–23)

## 2016-02-29 LAB — CREATININE, SERUM: Creatinine, Ser: 0.95 mg/dL (ref 0.40–1.20)

## 2016-02-29 NOTE — Telephone Encounter (Signed)
Patient notified of appt details.  

## 2016-02-29 NOTE — Telephone Encounter (Signed)
Left message for patient to call back  

## 2016-03-01 ENCOUNTER — Ambulatory Visit (INDEPENDENT_AMBULATORY_CARE_PROVIDER_SITE_OTHER)
Admission: RE | Admit: 2016-03-01 | Discharge: 2016-03-01 | Disposition: A | Discharge status: Home / Self Care | Payer: Medicare Other | Source: Ambulatory Visit | Attending: Internal Medicine | Admitting: Internal Medicine

## 2016-03-01 DIAGNOSIS — R1010 Upper abdominal pain, unspecified: Secondary | ICD-10-CM

## 2016-03-01 DIAGNOSIS — R10819 Abdominal tenderness, unspecified site: Secondary | ICD-10-CM

## 2016-03-01 MED ORDER — IOPAMIDOL (ISOVUE-300) INJECTION 61%
100.0000 mL | Freq: Once | INTRAVENOUS | Status: AC | PRN
  Administered 2016-03-01: 100 mL via INTRAVENOUS

## 2016-03-02 NOTE — Progress Notes (Signed)
This study does not show a cause for her pain which is reassuring. She has a rare congenital abnormality of pancreatic ducts which should not be a problem. I hope this reassures her and that she is able to have a good trip to [place]. Let me know if any other ?'s  If she needs a prescription for tramadol 50 mg every 6 hrs prn pain # 30 that is ok no refills  If she has persistent problems she should let me know

## 2016-03-29 ENCOUNTER — Ambulatory Visit (INDEPENDENT_AMBULATORY_CARE_PROVIDER_SITE_OTHER): Payer: Medicare Other | Admitting: Internal Medicine

## 2016-03-29 ENCOUNTER — Encounter: Payer: Self-pay | Admitting: Internal Medicine

## 2016-03-29 ENCOUNTER — Encounter (INDEPENDENT_AMBULATORY_CARE_PROVIDER_SITE_OTHER): Payer: Self-pay

## 2016-03-29 VITALS — BP 112/52 | HR 82 | Ht 68.0 in | Wt 148.0 lb

## 2016-03-29 DIAGNOSIS — K52831 Collagenous colitis: Secondary | ICD-10-CM | POA: Diagnosis not present

## 2016-03-29 DIAGNOSIS — K589 Irritable bowel syndrome without diarrhea: Secondary | ICD-10-CM | POA: Diagnosis not present

## 2016-03-29 DIAGNOSIS — R197 Diarrhea, unspecified: Secondary | ICD-10-CM

## 2016-03-29 MED ORDER — PREDNISONE 10 MG PO TABS: 10.0000 mg | ORAL_TABLET | Freq: Every day | ORAL | 0 refills | Status: DC

## 2016-03-29 MED ORDER — DIPHENOXYLATE-ATROPINE 2.5-0.025 MG PO TABS: 1.0000 | ORAL_TABLET | Freq: Four times a day (QID) | ORAL | 0 refills | Status: DC | PRN

## 2016-03-29 NOTE — Patient Instructions (Addendum)
Your physician has requested that you go to the basement for stool test before leaving today.  We have sent the following medications to your pharmacy for you to pick up at your convenience: prednisone, lomotil (faxed to pharmacy)  You have been scheduled for a return appt with Dr. Carlean Purl on 06/04/16 at 9:45 am.  Thank you for choosing me and [facility].  Gatha Mayer, M.D., [facility]

## 2016-03-29 NOTE — Progress Notes (Signed)
   Assessment & Plan:   Encounter Diagnoses  Name Primary?  . Diarrhea, unspecified type Yes  . Collagenous colitis   . IBS (irritable bowel syndrome)    It's looking like her main problem is collagenous colitis flare at this point though I know she has IBS also. She had some antibiotics in April, C. difficile seems possible. She could have picked up a parasite on her [place] cruise though symptoms predated that seems unlikely but possible. Dysphagia symptoms and pain in the abdomen are much better.   Plans for treatment are: Prednisone 10 mgqd - in donut hole so Entocort not an option, ago with low-dose prednisone because of palpitations and jitteriness with higher doses. Hopefully she'll tolerate 10 mg.  Lomotil 1-4 times a day as needed replacing loperamide  Her medication list does not seem to have meds typical for triggering collagenous colitis.  Additional testing:  C diff, giardia/crypto I don't think endoscopic evaluation would provide any new information at this point.  Follow-up: See me Nov  I appreciate the opportunity to care for this patient. CC: [name], MD   Subjective:    Patient ID: [name], female    DOB: 07/26/1945, 70 y.o.   MRN: [id number]  HPI The patient returns. She is continued to have diarrhea up to 12 times over the weekend on Saturday. Urgent defecation, proceeded by upper abdominal cramping and urgent loose watery stools. She made it to [place] but was having a lot of diarrhea still, using Imodium to try to relieve symptoms somewhat. Gets bloated as the day goes on. Stools are watery and foul smelling. Had antibiotics in April. Abdominal pains otherwise and dysphagia which she was having earlier in the summer or much better. She wonders if she needs another colonoscopy, she had one in late 2015 which showed diverticulosis and biopsies demonstrated collagenous colitis again. At that point I treated her with prednisone as she could not get  Entocort due to cost and availability she was in the doughnut hole then. She is back in the donut hole now. Wt Readings from Last 3 Encounters:  03/29/16 148 lb (67.1 kg)  02/16/16 145 lb (65.8 kg)  02/13/16 145 lb 12.8 oz (66.1 kg)   Medications, allergies, past medical history, past surgical history, family history and social history are reviewed and updated in the EMR.   Review of Systems As above    Objective:   Physical Exam BP (!) 112/52 (BP Location: Left Arm, Patient Position: Sitting, Cuff Size: Normal)   Pulse 82   Ht 5\' 8"  (1.727 m)   Wt 148 lb (67.1 kg)   BMI 22.50 kg/m  Eyes anicteric No Acute distress Mildly anxious   15 minutes time spent with patient > half in counseling coordination of care

## 2016-03-30 ENCOUNTER — Other Ambulatory Visit: Payer: Medicare Other

## 2016-03-30 ENCOUNTER — Telehealth: Payer: Self-pay | Admitting: Cardiovascular Disease

## 2016-03-30 DIAGNOSIS — R197 Diarrhea, unspecified: Secondary | ICD-10-CM

## 2016-03-30 NOTE — Telephone Encounter (Signed)
Pt is in donut hole  Is asking for Xarelto samples Please call back when we can provide some samples

## 2016-03-31 LAB — CLOSTRIDIUM DIFFICILE BY PCR: CDIFFPCR: DETECTED — AB

## 2016-04-01 ENCOUNTER — Telehealth: Payer: Self-pay | Admitting: Internal Medicine

## 2016-04-01 DIAGNOSIS — A0472 Enterocolitis due to Clostridium difficile, not specified as recurrent: Secondary | ICD-10-CM

## 2016-04-01 HISTORY — DX: Enterocolitis due to Clostridium difficile, not specified as recurrent: A04.72

## 2016-04-01 MED ORDER — VANCOMYCIN HCL 125 MG PO CAPS: 125.0000 mg | ORAL_CAPSULE | Freq: Four times a day (QID) | ORAL | 0 refills | Status: AC

## 2016-04-01 NOTE — Assessment & Plan Note (Signed)
Start vancomycin 125 mg qid x 10d

## 2016-04-01 NOTE — Progress Notes (Signed)
Has C diff I called results - will start vanco 125 mg qid x 10d She will stop prednisone She will call me Thurs or sooner before she heads out on vacation

## 2016-04-01 NOTE — Telephone Encounter (Signed)
C diff +\ Stop prednisone Start vancomycin 125 mg qid x 10 d  She will call me w/ update in 4-5 d She is going on 10 d vacation on 9/15

## 2016-04-02 NOTE — Telephone Encounter (Signed)
Xarelto 15 mg samples at front desk for pick up.

## 2016-04-05 LAB — GIARDIA/CRYPTOSPORIDIUM (EIA)

## 2016-04-06 ENCOUNTER — Telehealth: Payer: Self-pay | Admitting: Internal Medicine

## 2016-04-06 NOTE — Telephone Encounter (Signed)
She should continue the vancomycin to complete the 10 day course.  Also recommend that she take one OTC imodium now and every morning shortly after waking for the next 2-3 weeks, only stop if she has no BM for 1-2 days.  Can stop the lomotil. No need for florastor for now.  Thanks

## 2016-04-06 NOTE — Telephone Encounter (Signed)
Patient notified

## 2016-04-06 NOTE — Telephone Encounter (Signed)
Patient was diagnosed with c-diff last week.  She was started on Vanc 125 mg qid on Monday.  She was to call with an update before she leaves out of town today for the beach per Dr. Carlean Purl instructions.  She reports that she is still having some urgency and about 10 stools a day.  She reports that stools are more solid, but still having urgency after each meal.  She also has a hx of collagenous colitis.  She has completed 5 days of Vanc.  He also gave her lomotil, but she said it stopped the stools but it gave her terrible gas pains so she has not been taking it.  She is not on Florastor.  Dr. Ardis Hughs does she need to extend the vanc rx and start on Florastor.  You are doc of the day

## 2016-04-17 ENCOUNTER — Telehealth: Payer: Self-pay | Admitting: Internal Medicine

## 2016-04-17 NOTE — Telephone Encounter (Signed)
Patient reports that diarrhea has stopped.  She has completed all of her vancomycin.  She is using imodium prn.  Diarrhea has stopped.

## 2016-04-17 NOTE — Telephone Encounter (Signed)
We wait and hope it stays gone.

## 2016-04-23 ENCOUNTER — Emergency Department
Admission: EM | Admit: 2016-04-23 | Discharge: 2016-04-23 | Disposition: A | Discharge status: Home / Self Care | Payer: Medicare Other

## 2016-04-23 DIAGNOSIS — R05 Cough: Secondary | ICD-10-CM | POA: Diagnosis not present

## 2016-04-23 DIAGNOSIS — B379 Candidiasis, unspecified: Secondary | ICD-10-CM | POA: Diagnosis not present

## 2016-04-23 DIAGNOSIS — K219 Gastro-esophageal reflux disease without esophagitis: Secondary | ICD-10-CM | POA: Diagnosis not present

## 2016-04-24 DIAGNOSIS — Z23 Encounter for immunization: Secondary | ICD-10-CM | POA: Diagnosis not present

## 2016-05-07 ENCOUNTER — Telehealth: Payer: Self-pay | Admitting: Internal Medicine

## 2016-05-07 DIAGNOSIS — L820 Inflamed seborrheic keratosis: Secondary | ICD-10-CM | POA: Diagnosis not present

## 2016-05-07 DIAGNOSIS — R197 Diarrhea, unspecified: Secondary | ICD-10-CM

## 2016-05-07 DIAGNOSIS — I788 Other diseases of capillaries: Secondary | ICD-10-CM | POA: Diagnosis not present

## 2016-05-07 DIAGNOSIS — L570 Actinic keratosis: Secondary | ICD-10-CM | POA: Diagnosis not present

## 2016-05-07 DIAGNOSIS — L301 Dyshidrosis [pompholyx]: Secondary | ICD-10-CM | POA: Diagnosis not present

## 2016-05-07 DIAGNOSIS — D692 Other nonthrombocytopenic purpura: Secondary | ICD-10-CM | POA: Diagnosis not present

## 2016-05-07 NOTE — Telephone Encounter (Signed)
Patient reports that diarrhea has returned. She completed her vancomycin on 04/11/16 for c-diff.  ^ episodes of watery diarrhea yesterday.  She feels this is c-diff again.  Please advise next step

## 2016-05-07 NOTE — Telephone Encounter (Signed)
Patient notified of recommendations She would like to be tested for c-diff prior to starting vancomycin again. She was seen by her primary care this am and treated for thrush due to the last round of vancomycin. She was advised to try VSL #3 and protonix. She asks if she needs to be on Protonix or famotidine? If cdiff positive she asks if she needs to take the VSL #3 at the same time.

## 2016-05-07 NOTE — Telephone Encounter (Signed)
Vancomycin 125 mg qid x 10 d   Then  125 mg every 3rd day for 10 doses total  Disp # 50  If not improving in 5 d call back

## 2016-05-08 NOTE — Telephone Encounter (Signed)
Patient notified She will come and pick up the containers today or tomorrow

## 2016-05-08 NOTE — Telephone Encounter (Signed)
OK  C diff PCR

## 2016-05-09 ENCOUNTER — Other Ambulatory Visit: Payer: Medicare Other

## 2016-05-09 DIAGNOSIS — R197 Diarrhea, unspecified: Secondary | ICD-10-CM

## 2016-05-10 ENCOUNTER — Other Ambulatory Visit: Payer: Self-pay

## 2016-05-10 LAB — CLOSTRIDIUM DIFFICILE BY PCR: CDIFFPCR: DETECTED — AB

## 2016-05-10 MED ORDER — VANCOMYCIN 50 MG/ML ORAL SOLUTION: ORAL | 0 refills | Status: DC

## 2016-05-10 NOTE — Progress Notes (Signed)
I called results and explained what I think is going on, Plans  1) Rx the vancomycin w/ taper that I ordered before - send to [facility] 2) She will stay on Nystatin swish/swallow that PCP Rxed - stay on while on the Abx  3) Lomotil prn - she has 4) Please get her a f/u for about 6 weeks from now - hopefully have a work-in spot 5) She will continue Floragen probiotic 6) After she is done with Floragen and the vancomycin ask her to take 1 Florastor each day

## 2016-05-21 ENCOUNTER — Other Ambulatory Visit: Payer: Self-pay | Admitting: Internal Medicine

## 2016-05-21 ENCOUNTER — Telehealth: Payer: Self-pay | Admitting: Internal Medicine

## 2016-05-21 DIAGNOSIS — Z1231 Encounter for screening mammogram for malignant neoplasm of breast: Secondary | ICD-10-CM

## 2016-05-21 NOTE — Telephone Encounter (Signed)
Patient notified that she is to take 125 mg every 3rd day for a total of of 10 doses.  All questions answered .  She will call back for any additional questions or concerns.

## 2016-05-24 DIAGNOSIS — K219 Gastro-esophageal reflux disease without esophagitis: Secondary | ICD-10-CM | POA: Diagnosis not present

## 2016-05-24 DIAGNOSIS — F5101 Primary insomnia: Secondary | ICD-10-CM | POA: Diagnosis not present

## 2016-05-24 DIAGNOSIS — B379 Candidiasis, unspecified: Secondary | ICD-10-CM | POA: Diagnosis not present

## 2016-06-04 ENCOUNTER — Ambulatory Visit (INDEPENDENT_AMBULATORY_CARE_PROVIDER_SITE_OTHER): Payer: Medicare Other | Admitting: Internal Medicine

## 2016-06-04 ENCOUNTER — Encounter: Payer: Self-pay | Admitting: Internal Medicine

## 2016-06-04 VITALS — BP 120/60 | HR 64 | Ht 66.75 in | Wt 146.0 lb

## 2016-06-04 DIAGNOSIS — A0471 Enterocolitis due to Clostridium difficile, recurrent: Secondary | ICD-10-CM

## 2016-06-04 DIAGNOSIS — K52831 Collagenous colitis: Secondary | ICD-10-CM | POA: Diagnosis not present

## 2016-06-04 DIAGNOSIS — K594 Anal spasm: Secondary | ICD-10-CM | POA: Diagnosis not present

## 2016-06-04 DIAGNOSIS — K580 Irritable bowel syndrome with diarrhea: Secondary | ICD-10-CM

## 2016-06-04 NOTE — Progress Notes (Signed)
   [name] 70 y.o. 1946-03-18 [id number]  Assessment & Plan:   1. Recurrent colitis due to Clostridium difficile   2. Irritable bowel syndrome with diarrhea   3. Collagenous colitis   4. Proctalgia fugax    She appears to be responding to the current treatment for C. difficile. A background of IBS and collagenous colitis make sorting this out somewhat difficult but she is clearly improved, she was having 12 watery stools a day at the onset of treatment. Down to 2 or 3 soft stools a day. She is using Lomotil on a prophylactic basis 1 at a time for a day when she travels or is in the social situation. That seems to be working well. I reviewed IBS and proctalgia fugax with the patient. I have explained transmission risk factors etc. and how to use good hand hygiene to reduce the risk that I didn't think there was contact precautions situation needed anywhere she went to be very unlikely she would transmit this to anybody and cause illness. Seemed reassured.  I will see her again in January. I have given her handouts about C. difficile risk factors treatment transmission what to do at home etc. We have touched on an introduced the concept of fecal microbiology transplant should she have another episode. I appreciate the opportunity to care for this patient.  CC: [name], MD    Subjective:   Chief Complaint: C. difficile diarrhea/colitis  HPI The patient is here with her husband. She is having about 3 soft stools a day now. She is still scared because she has a heightened gastrocolic reflex. She is about 3 pills or doses of liquid vancomycin into a 10 dose 125 mg every 3 days taper. This is her third treatment for C. difficile. She is very concerned about the possibility of transmission, she has had stories from people about contact precautions etc. when they're in the hospital. She has an active social schedule and her symptoms and these worries make it difficult. Her husband is  about to see the doctor at the [facility] today because of some acute diarrhea that is actually resolving. She notes that she has had off and on for a number of years occasional intense crampy rectal pain. If she sits on the toilet and strains the symptoms break. It often wakes in her at night. Medications, allergies, past medical history, past surgical history, family history and social history are reviewed and updated in the EMR.   Review of Systems As above  Objective:   Physical Exam BP 120/60 (BP Location: Left Arm, Patient Position: Sitting, Cuff Size: Normal)   Pulse 64   Ht 5' 6.75" (1.695 m) Comment: height measured without shoes  Wt 146 lb (66.2 kg)   BMI 23.04 kg/m  No acute distress  25 minutes time spent with patient > half in counseling coordination of care

## 2016-06-04 NOTE — Patient Instructions (Signed)
  We are giving you C-Diff information to read today.    We will see you again 08/06/16 at 10:45AM.     I appreciate the opportunity to care for you. [name], MD, [facility]

## 2016-06-11 ENCOUNTER — Encounter: Payer: Self-pay | Admitting: Cardiovascular Disease

## 2016-06-11 ENCOUNTER — Ambulatory Visit (INDEPENDENT_AMBULATORY_CARE_PROVIDER_SITE_OTHER): Payer: Medicare Other | Admitting: Cardiovascular Disease

## 2016-06-11 VITALS — BP 110/62 | HR 58 | Ht 68.0 in | Wt 143.5 lb

## 2016-06-11 DIAGNOSIS — I4891 Unspecified atrial fibrillation: Secondary | ICD-10-CM

## 2016-06-11 DIAGNOSIS — F17200 Nicotine dependence, unspecified, uncomplicated: Secondary | ICD-10-CM

## 2016-06-11 DIAGNOSIS — Z7189 Other specified counseling: Secondary | ICD-10-CM

## 2016-06-11 DIAGNOSIS — K589 Irritable bowel syndrome without diarrhea: Secondary | ICD-10-CM

## 2016-06-11 DIAGNOSIS — R079 Chest pain, unspecified: Secondary | ICD-10-CM

## 2016-06-11 MED ORDER — FLECAINIDE ACETATE 50 MG PO TABS: ORAL_TABLET | ORAL | 3 refills | Status: DC

## 2016-06-11 NOTE — Progress Notes (Signed)
Cardiology Office Note  Date:  06/11/2016   ID:  Shonika, Werlein 01-30-1946, MRN [id number]  PCP:  [name], MD   Chief Complaint  Patient presents with  . other    6 month f/u no complaints. Meds reviewed verbally with pt.    HPI:  Mrs Overy is a 70 year old woman with a history of palpitations and tachycardia, paroxysmal atrial fibrillation who presents for routine followup of her atrial fibrillation. In the past, she has had Several episodes of atrial fibrillation, one requiring cardioversion. Previous smoking history, stopped in 2012 CT coronary calcium score showing mid LAD coronary artery disease, score of 50  In follow-up today she reports it has been a difficult year She reports that she Dropped box on her left leg, It developed a severe Bruise, turning into cellulitis Went to the hospital, stopped xarelto She was given ABX for her leg, but developed C.Diff, Seen by GI, ABX given for C.Diff. Currently Every third day on vancomycin liquid Reports symptoms are much improved  xarelto up to >150$ , in the donut hole rare episodes of atrial fib Went to the ER once, keep for a few hours but converted to normal sinus rhythm on her own  EKG on today's visit shows normal sinus rhythm with rate 58 bpm, no significant ST or T-wave changes  Other past medical history reviewed Previous CT coronary calcium score of 50, most of her disease in the mid LAD Mild aorta atherosclerosis Images reviewed with her in the office in detail  Previously reported having diverticulitis, ulcerative colitis. Given prednisone Tolerating anticoagulation  No recent lipid panel available.    echocardiogram done at [facility] 01/17/2012 - Left ventricle: The cavity size was normal. Wall thickness was normal. Systolic function was normal. The estimated   ejection fraction was in the range of 55% to 60%. Wall motion was normal; there were no regional wall motion abnormalities. - Mitral  valve: Mild regurgitation.     PMH:   has a past medical history of Arthritis; Atrial fibrillation ([place]); Broken ribs; C. difficile diarrhea; Cataract; Collagenous colitis; Diverticulosis; Esophageal candidiasis ([place]); GERD (gastroesophageal reflux disease); Heart murmur; Hiatal hernia (2004); Hyperlipidemia; IBS (irritable bowel syndrome); Internal hemorrhoids (2010); Kidney stones; Midsternal chest pain; Migraine (01/16/12); Mitral valve disorders(424.0); Osteoporosis; Schatzki's ring (2010); Squamous cell carcinoma of skin; and Ulcerative colitis ([place]).  PSH:    Past Surgical History:  Procedure Laterality Date  . APPENDECTOMY  1963  . BREAST CYST ASPIRATION Bilateral    "several; both sides"  . CARDIOVERSION  01/16/2012   Procedure: CARDIOVERSION;  Surgeon: Evans Lance, MD;  Location: [place];  Service: Cardiovascular;  Laterality: N/A;  . COLONOSCOPY  Multiple  . CYSTOSCOPY W/ STONE MANIPULATION  1980's  . LEG SURGERY     skin cancer removal  . OVARIAN CYST REMOVAL  1990's  . SHOULDER ARTHROSCOPY W/ ROTATOR CUFF REPAIR Right ~ 2005  . SQUAMOUS CELL CARCINOMA EXCISION     "10-12 removed; all over my body - nose, predominately legs"  . TONSILLECTOMY AND ADENOIDECTOMY  1975  . UPPER GASTROINTESTINAL ENDOSCOPY  Multiple   w/dilation, hiatal hernia  . VAGINAL HYSTERECTOMY  1972    Current Outpatient Prescriptions  Medication Sig Dispense Refill  . ALPRAZolam (XANAX) 0.5 MG tablet Take 0.5 mg by mouth at bedtime.     . Coenzyme Q10 (COQ10 PO) Take 1 tablet by mouth daily.    Marland Kitchen diltiazem (CARDIZEM) 30 MG tablet Take 1 tablet (30  mg total) by mouth 3 (three) times daily as needed (for breakthrough atrial fibrillation). 90 tablet 4  . diphenoxylate-atropine (LOMOTIL) 2.5-0.025 MG tablet Take 1 tablet by mouth 4 (four) times daily as needed for diarrhea or loose stools. 90 tablet 0  . famotidine (PEPCID) 40 MG tablet Take 1 tablet (40 mg total) by mouth 2 (two) times daily before a meal.  Breakfast and supper 60 tablet 11  . flecainide (TAMBOCOR) 50 MG tablet TAKE ONE TABLET BY MOUTH TWICE DAILY 180 tablet 3  . metoprolol succinate (TOPROL-XL) 25 MG 24 hr tablet Take 1 tablet (25 mg total) by mouth daily. 90 tablet 4  . propranolol (INDERAL) 20 MG tablet Take 1 tablet (20 mg total) by mouth 3 (three) times daily as needed (for breakthrough atrial fibrillation). 90 tablet 4  . rivaroxaban (XARELTO) 20 MG TABS tablet Take 1 tablet (20 mg total) by mouth daily. 30 tablet 11  . rosuvastatin (CRESTOR) 5 MG tablet TAKE 1 TABLET BY MOUTH DAILY. 90 tablet 3  . vancomycin (VANCOCIN) 50 mg/mL oral solution Take 2.5 ml by mouth 4 times a day for 10 days, then 2.5 ml every 3rd day for 10 doses total 160 mL 0   Current Facility-Administered Medications  Medication Dose Route Frequency Provider Last Rate Last Dose  . 0.9 %  sodium chloride infusion   Intravenous Continuous Gatha Mayer, MD         Allergies:   Latex; Adhesive [tape]; Aspirin; and Prednisone   Social History:  The patient  reports that she quit smoking about 4 years ago. Her smoking use included Cigarettes. She has a 4.00 pack-year smoking history. She has never used smokeless tobacco. She reports that she drinks alcohol. She reports that she does not use drugs.   Family History:   family history includes Alzheimer's disease in her mother; Diabetes in her father; Heart disease in her father; Irritable bowel syndrome in her father; Kidney disease in her father.    Review of Systems: Review of Systems  Constitutional: Negative.   Respiratory: Negative.   Cardiovascular: Negative.   Gastrointestinal: Negative.   Musculoskeletal: Negative.   Neurological: Negative.   Psychiatric/Behavioral: Negative.   All other systems reviewed and are negative.    PHYSICAL EXAM: VS:  BP 110/62 (BP Location: Left Arm, Patient Position: Sitting, Cuff Size: Normal)   Pulse (!) 58   Ht 5\' 8"  (1.727 m)   Wt 143 lb 8 oz (65.1 kg)    BMI 21.82 kg/m  , BMI Body mass index is 21.82 kg/m. GEN: Well nourished, well developed, in no acute distress  HEENT: normal  Neck: no JVD, carotid bruits, or masses Cardiac: RRR; no murmurs, rubs, or gallops,no edema  Respiratory:  clear to auscultation bilaterally, normal work of breathing GI: soft, nontender, nondistended, + BS MS: no deformity or atrophy  Skin: warm and dry, no rash Neuro:  Strength and sensation are intact Psych: euthymic mood, full affect    Recent Labs: 12/11/2015: Hemoglobin 12.5; Magnesium 2.0; Platelets 193; Potassium 3.3; Sodium 139 12/12/2015: ALT 17 02/29/2016: BUN 18; Creatinine, Ser 0.95    Lipid Panel Lab Results  Component Value Date   CHOL 145 12/12/2015   HDL 71 12/12/2015   LDLCALC 67 12/12/2015   TRIG 37 12/12/2015      Wt Readings from Last 3 Encounters:  06/11/16 143 lb 8 oz (65.1 kg)  06/04/16 146 lb (66.2 kg)  03/29/16 148 lb (67.1 kg)  ASSESSMENT AND PLAN:  Atrial fibrillation with rapid ventricular response ([place]) - Plan: EKG 12-Lead History of paroxysmal atrial fibrillation Stable rare episodes Typically resolve on their own without intervention, sometimes takes extra flecainide, propranolol or diltiazem Tolerating anticoagulation  Chest pain, unspecified type No active chest pain, no further workup at this time  Smoking We have encouraged her to continue to work on weaning her cigarettes and smoking cessation. She will continue to work on this and does not want any assistance with chantix.   Encounter for anticoagulation discussion and counseling Long discussion about anticoagulation, recent events with injury to her left leg resulting in hematoma, cellulitis . stressed importance of stopping her anticoagulation for any traumatic injury.   Irritable bowel syndrome, unspecified type Recent C. difficile, has follow-up with GI   takes antibiotics every third day Long discussion concerning recent events   Total  encounter time more than 25 minutes  Greater than 50% was spent in counseling and coordination of care with the patient   Disposition:   F/U  6 months   Orders Placed This Encounter  Procedures  . EKG 12-Lead     Signed, Esmond Plants, M.D., Ph.D. 06/11/2016  [place], [place]

## 2016-06-11 NOTE — Patient Instructions (Signed)

## 2016-07-04 ENCOUNTER — Ambulatory Visit
Admission: RE | Admit: 2016-07-04 | Discharge: 2016-07-04 | Disposition: A | Discharge status: Home / Self Care | Payer: Medicare Other | Source: Ambulatory Visit | Attending: Internal Medicine | Admitting: Internal Medicine

## 2016-07-04 DIAGNOSIS — Z1231 Encounter for screening mammogram for malignant neoplasm of breast: Secondary | ICD-10-CM | POA: Diagnosis not present

## 2016-07-25 ENCOUNTER — Other Ambulatory Visit: Payer: Self-pay | Admitting: *Deleted

## 2016-07-25 MED ORDER — RIVAROXABAN 20 MG PO TABS: 20.0000 mg | ORAL_TABLET | Freq: Every day | ORAL | 3 refills | Status: DC

## 2016-07-25 NOTE — Telephone Encounter (Signed)
Requested Prescriptions   Signed Prescriptions Disp Refills  . rivaroxaban (XARELTO) 20 MG TABS tablet 30 tablet 3    Sig: Take 1 tablet (20 mg total) by mouth daily.    Authorizing Provider: Minna Merritts    Ordering User: Britt Bottom

## 2016-08-06 ENCOUNTER — Encounter: Payer: Self-pay | Admitting: Internal Medicine

## 2016-08-06 ENCOUNTER — Ambulatory Visit (INDEPENDENT_AMBULATORY_CARE_PROVIDER_SITE_OTHER): Payer: Medicare Other | Admitting: Internal Medicine

## 2016-08-06 VITALS — BP 84/56 | HR 60 | Ht 66.75 in | Wt 146.0 lb

## 2016-08-06 DIAGNOSIS — F411 Generalized anxiety disorder: Secondary | ICD-10-CM

## 2016-08-06 DIAGNOSIS — Z8619 Personal history of other infectious and parasitic diseases: Secondary | ICD-10-CM

## 2016-08-06 DIAGNOSIS — K582 Mixed irritable bowel syndrome: Secondary | ICD-10-CM

## 2016-08-06 DIAGNOSIS — E739 Lactose intolerance, unspecified: Secondary | ICD-10-CM | POA: Insufficient documentation

## 2016-08-06 NOTE — Progress Notes (Signed)
[name] 71 y.o. 10/21/1945 [id number]  Assessment & Plan:   Encounter Diagnoses  Name Primary?  . Irritable bowel syndrome with both constipation and diarrhea Yes  . History of Clostridium difficile colitis   . Anxiety state     I think she is having IBS problems now. Will try VSL# 3 DS daily x 1 month and ask her to call back with results to see if that alleviates some of her sxs.  Asked her to discuss possible other anxiety Txs with her PCP as there is anxiety overlay which contributes to her problems I think  F/u prn  Cc:KHAN, FOZIA M, MD    Subjective:   Chief Complaint: altered bowel habits, bloating and gas  HPI Here for f/u recurrent C diff. Has had 2 episodes both Tx vancomycin. Not having C diff now "I could tell if I was" but having irregular soft/hard defecation. Some bloating also. Unsure if she will have flatulence vs stool or both and is somewhat worried about being away from home and where bathrooms are. Wonders if anxiety making things worse. Also recently met 2 old friends whose spouses died related to C diff and that scared her.  Medications, allergies, past medical history, past surgical history, family history and social history are reviewed and updated in the EMR.  Wt Readings from Last 3 Encounters:  08/06/16 146 lb (66.2 kg)  06/11/16 143 lb 8 oz (65.1 kg)  06/04/16 146 lb (66.2 kg)   Review of Systems As above  Objective:   Physical Exam BP (!) 84/56   Pulse 60   Ht 5' 6.75" (1.695 m)   Wt 146 lb (66.2 kg)   BMI 23.04 kg/m  NAD eyes anicteric Appropriate mood and affect  15 minutes time spent with patient > half in counseling coordination of care  

## 2016-08-06 NOTE — Patient Instructions (Addendum)
Instructions per Dr. Carlean Purl:  Talk to PCP about anxiety treatment.  Take Probiotic Medical Food (VSL #3 DS)  once daily times one month (keep refrigerated), samples provided for a month's worth.  I appreciate the opportunity to care for you.   Dr. [name], [facility]

## 2016-08-08 ENCOUNTER — Encounter: Payer: Self-pay | Admitting: Internal Medicine

## 2016-08-19 ENCOUNTER — Other Ambulatory Visit: Payer: Self-pay | Admitting: Cardiovascular Disease

## 2016-09-22 ENCOUNTER — Telehealth: Payer: Self-pay | Admitting: Internal Medicine

## 2016-09-22 NOTE — Telephone Encounter (Signed)
Patient called to tell that she had an episode of atrial fib and she took 3 flecainides and she came out of it. Today it happened again and she took one flecainide and she came out of it.

## 2016-09-23 NOTE — Telephone Encounter (Signed)
We could consider increase the flecainide up to 100 mg twice a day on a regular basisi

## 2016-09-24 MED ORDER — FLECAINIDE ACETATE 100 MG PO TABS: ORAL_TABLET | ORAL | 6 refills | Status: DC

## 2016-09-24 NOTE — Telephone Encounter (Addendum)
Spoke w/ pt.  She reports that on Friday, she was lying in bed watching TV, she rolled over and felt when her HR went out of rhythm. She took another flecainide and after 5 hours, she felt her HR return to normal. She returned to abnormal rhythm on Saturday.  Advised her of Dr. Donivan Scull recommendation.  She is agreeable and will call back w/ any further questions or concerns.

## 2016-09-24 NOTE — Addendum Note (Signed)
Addended by: Dede Query R on: 09/24/2016 10:36 AM   Modules accepted: Orders

## 2016-10-03 DIAGNOSIS — L99 Other disorders of skin and subcutaneous tissue in diseases classified elsewhere: Secondary | ICD-10-CM | POA: Diagnosis not present

## 2016-10-03 DIAGNOSIS — L853 Xerosis cutis: Secondary | ICD-10-CM | POA: Diagnosis not present

## 2016-10-03 DIAGNOSIS — L821 Other seborrheic keratosis: Secondary | ICD-10-CM | POA: Diagnosis not present

## 2016-10-03 DIAGNOSIS — L570 Actinic keratosis: Secondary | ICD-10-CM | POA: Diagnosis not present

## 2016-10-03 DIAGNOSIS — L820 Inflamed seborrheic keratosis: Secondary | ICD-10-CM | POA: Diagnosis not present

## 2016-10-03 DIAGNOSIS — Z85828 Personal history of other malignant neoplasm of skin: Secondary | ICD-10-CM | POA: Diagnosis not present

## 2016-10-23 DIAGNOSIS — K209 Esophagitis, unspecified: Secondary | ICD-10-CM | POA: Diagnosis not present

## 2016-10-23 DIAGNOSIS — J439 Emphysema, unspecified: Secondary | ICD-10-CM | POA: Diagnosis not present

## 2016-10-23 DIAGNOSIS — R079 Chest pain, unspecified: Secondary | ICD-10-CM | POA: Diagnosis not present

## 2016-10-23 DIAGNOSIS — F17200 Nicotine dependence, unspecified, uncomplicated: Secondary | ICD-10-CM | POA: Diagnosis not present

## 2016-10-23 DIAGNOSIS — I4891 Unspecified atrial fibrillation: Secondary | ICD-10-CM | POA: Diagnosis not present

## 2016-10-23 DIAGNOSIS — R0789 Other chest pain: Secondary | ICD-10-CM | POA: Diagnosis not present

## 2016-10-23 DIAGNOSIS — Z886 Allergy status to analgesic agent status: Secondary | ICD-10-CM | POA: Diagnosis not present

## 2016-10-26 ENCOUNTER — Other Ambulatory Visit: Payer: Self-pay | Admitting: *Deleted

## 2016-10-26 MED ORDER — RIVAROXABAN 20 MG PO TABS: 20.0000 mg | ORAL_TABLET | Freq: Every day | ORAL | 5 refills | Status: DC

## 2016-11-22 DIAGNOSIS — K219 Gastro-esophageal reflux disease without esophagitis: Secondary | ICD-10-CM | POA: Diagnosis not present

## 2016-11-22 DIAGNOSIS — F5102 Adjustment insomnia: Secondary | ICD-10-CM | POA: Diagnosis not present

## 2016-11-22 DIAGNOSIS — I482 Chronic atrial fibrillation: Secondary | ICD-10-CM | POA: Diagnosis not present

## 2016-11-22 DIAGNOSIS — G2581 Restless legs syndrome: Secondary | ICD-10-CM | POA: Diagnosis not present

## 2016-11-28 ENCOUNTER — Other Ambulatory Visit: Payer: Self-pay

## 2016-11-28 MED ORDER — METOPROLOL SUCCINATE ER 25 MG PO TB24: ORAL_TABLET | ORAL | 0 refills | Status: DC

## 2016-12-08 DIAGNOSIS — I480 Paroxysmal atrial fibrillation: Secondary | ICD-10-CM | POA: Insufficient documentation

## 2016-12-08 NOTE — Progress Notes (Signed)
Cardiology Office Note  Date:  12/10/2016   ID:  [name], [name] 1946/02/08, MRN [id number]  PCP:  [name], MD   Chief Complaint  Patient presents with  . other    6 month follow up. Meds reviewed by the pt. verbally. Pt. c/o being at [facility] about 3 weeks ago with chest pain that radiated through to her back.     HPI:  [name] is a 71 year old woman with a history of  palpitations and tachycardia,  paroxysmal atrial fibrillation , one episode requiring cardioversion. 2013 smoking history, stopped in 2012 CT coronary calcium score showing Mid LAD coronary calcification, score of 50 in 2016 who presents for routine followup of her atrial fibrillation.  Recent hospital evaluation at [facility] about 3 weeks ago with chest pain that radiated through to her back Records have been requested Was at [place], developed chest pain on the left Very painful, lasted couple hours, 10/10 Through to back Hurt to take a breath in Received IV meds (morphine?)  enz negative per the patient scan looked ok  (CT? Or V/Q ?). Was told she had thickening of her esophagus  No recurrent chest pain since that time in the past month even with exertion  Problem with Insomnia, chronic issue Waking up with sweats Started on xanax and ropinirole  dr. Humphrey Rolls  02/2016 CT ABD: Images reviewed with her in detail Aortic atherosclerosis.  More "attacks" with paroxysmal atrial fib Takes extra flecainide as needed  EKG personally reviewed by myself on todays visit Shows sinus bradycardia rate 53 bpm no significant ST or T-wave changes   Other past medical history reviewed  Dropped box on her left leg, It developed a severe Bruise, turning into cellulitis Went to the hospital, stopped xarelto She was given ABX for her leg, but developed C.Diff, Seen by GI, ABX given for C.Diff. Currently Every third day on vancomycin liquid Reports symptoms are much improved  xarelto  up to >150$ , in the donut hole Went to the ER once, keep for a few hours but converted to normal sinus rhythm on her own  Previous CT coronary calcium score of 50, most of her disease in the mid LAD Mild aorta atherosclerosis Images reviewed with her in the office in detail  Previously reported having diverticulitis, ulcerative colitis. Given prednisone Tolerating anticoagulation   echocardiogram done at [facility] 01/17/2012 - Left ventricle: The cavity size was normal. Wall thickness was normal. Systolic function was normal. The estimated   ejection fraction was in the range of 55% to 60%. Wall motion was normal; there were no regional wall motion abnormalities. - Mitral valve: Mild regurgitation.     PMH:   has a past medical history of Arthritis; Atrial fibrillation ([place]); Broken ribs; C. difficile diarrhea; Cataract; Collagenous colitis; Diverticulosis; Esophageal candidiasis ([place]); GERD (gastroesophageal reflux disease); Heart murmur; Hiatal hernia (2004); Hyperlipidemia; IBS (irritable bowel syndrome); Internal hemorrhoids (2010); Kidney stones; Midsternal chest pain; Migraine (01/16/12); Mitral valve disorders(424.0); Osteoporosis; Pancreas divisum of native pancreas; Schatzki's ring (2010); Squamous cell carcinoma of skin; and Ulcerative colitis ([place]).  PSH:    Past Surgical History:  Procedure Laterality Date  . APPENDECTOMY  1963  . BREAST CYST ASPIRATION Bilateral    "several; both sides"  . CARDIOVERSION  01/16/2012   Procedure: CARDIOVERSION;  Surgeon: Evans Lance, MD;  Location: [place];  Service: Cardiovascular;  Laterality: N/A;  . COLONOSCOPY  Multiple  . CYSTOSCOPY W/ STONE MANIPULATION  1980's  . LEG SURGERY     skin cancer removal  . OVARIAN CYST REMOVAL  1990's  . SHOULDER ARTHROSCOPY W/ ROTATOR CUFF REPAIR Right ~ 2005  . SQUAMOUS CELL CARCINOMA EXCISION     "10-12 removed; all over my body - nose, predominately legs"  . TONSILLECTOMY AND ADENOIDECTOMY   1975  . UPPER GASTROINTESTINAL ENDOSCOPY  Multiple   w/dilation, hiatal hernia  . VAGINAL HYSTERECTOMY  1972    Current Outpatient Prescriptions  Medication Sig Dispense Refill  . ALPRAZolam (XANAX) 0.5 MG tablet Take 0.5 mg by mouth at bedtime.     . Coenzyme Q10 (COQ10 PO) Take 1 tablet by mouth daily.    Marland Kitchen diltiazem (CARDIZEM) 30 MG tablet Take 1 tablet (30 mg total) by mouth 3 (three) times daily as needed (for breakthrough atrial fibrillation). (Patient taking differently: Take 30 mg by mouth as needed (for breakthrough atrial fibrillation). ) 90 tablet 4  . diphenoxylate-atropine (LOMOTIL) 2.5-0.025 MG tablet Take 1 tablet by mouth 4 (four) times daily as needed for diarrhea or loose stools. (Patient taking differently: Take 1 tablet by mouth as needed for diarrhea or loose stools. ) 90 tablet 0  . famotidine (PEPCID) 40 MG tablet Take 1 tablet (40 mg total) by mouth 2 (two) times daily before a meal. Breakfast and supper 60 tablet 11  . flecainide (TAMBOCOR) 100 MG tablet TAKE ONE TABLET BY MOUTH TWICE DAILY 30 tablet 6  . metoprolol succinate (TOPROL-XL) 25 MG 24 hr tablet TAKE 1 TABLET(25 MG) BY MOUTH DAILY 90 tablet 0  . pantoprazole (PROTONIX) 40 MG tablet Take 40 mg by mouth daily.     . Probiotic Product (VSL#3 DS PO) Take by mouth. One pack daily for a month    . propranolol (INDERAL) 20 MG tablet Take 1 tablet (20 mg total) by mouth 3 (three) times daily as needed (for breakthrough atrial fibrillation). (Patient taking differently: Take 40 mg by mouth as needed (for breakthrough atrial fibrillation). ) 90 tablet 4  . rivaroxaban (XARELTO) 20 MG TABS tablet Take 1 tablet (20 mg total) by mouth daily with supper. 30 tablet 5  . rosuvastatin (CRESTOR) 5 MG tablet TAKE 1 TABLET BY MOUTH DAILY. 90 tablet 3  . rOPINIRole (REQUIP) 0.5 MG tablet Take 0.5 mg by mouth as needed.     No current facility-administered medications for this visit.      Allergies:   Latex; Adhesive [tape];  Aspirin; and Prednisone   Social History:  The patient  reports that she quit smoking about 4 years ago. Her smoking use included Cigarettes. She has a 4.00 pack-year smoking history. She has never used smokeless tobacco. She reports that she drinks alcohol. She reports that she does not use drugs.   Family History:   family history includes Alzheimer's disease in her mother; Diabetes in her father; Heart disease in her father; Irritable bowel syndrome in her father; Kidney disease in her father.    Review of Systems: Review of Systems  Constitutional: Negative.   Respiratory: Negative.   Cardiovascular: Positive for chest pain.  Gastrointestinal: Negative.   Musculoskeletal: Negative.   Neurological: Negative.   Psychiatric/Behavioral: Negative.   All other systems reviewed and are negative.    PHYSICAL EXAM: VS:  BP 120/68 (BP Location: Left Arm, Patient Position: Sitting, Cuff Size: Normal)   Pulse (!) 53   Ht 5\' 7"  (1.702 m)   Wt 147 lb 12 oz (67 kg)   BMI 23.14 kg/m  ,  BMI Body mass index is 23.14 kg/m.  GEN: Well nourished, well developed, in no acute distress  HEENT: normal  Neck: no JVD, carotid bruits, or masses Cardiac: RRR; no murmurs, rubs, or gallops,no edema  Respiratory:  clear to auscultation bilaterally, normal work of breathing GI: soft, nontender, nondistended, + BS MS: no deformity or atrophy  Skin: warm and dry, no rash Neuro:  Strength and sensation are intact Psych: euthymic mood, full affect    Recent Labs: 12/11/2015: Hemoglobin 12.5; Magnesium 2.0; Platelets 193; Potassium 3.3; Sodium 139 12/12/2015: ALT 17 02/29/2016: BUN 18; Creatinine, Ser 0.95    Lipid Panel Lab Results  Component Value Date   CHOL 145 12/12/2015   HDL 71 12/12/2015   LDLCALC 67 12/12/2015   TRIG 37 12/12/2015      Wt Readings from Last 3 Encounters:  12/10/16 147 lb 12 oz (67 kg)  08/06/16 146 lb (66.2 kg)  06/11/16 143 lb 8 oz (65.1 kg)       ASSESSMENT AND  PLAN:  Atrial fibrillation with rapid ventricular response ([facility]) - Plan: EKG 12-Lead Rare episodes of paroxysmal atrial fibrillation Typically resolve on their own without intervention, sometimes takes extra flecainide, propranolol or diltiazem Tolerating anticoagulation  Chest pain, unspecified type We have requested recent hospital records No active chest pain, even on exertion Atypical in nature given low calcium score, and other previous workup Recommended if she has additional episodes that she call our office Prescription for Nitroglycerin sublingual was provided  Smoking We have encouraged her to continue to work on weaning her cigarettes and smoking cessation. She will continue to work on this and does not want any assistance with chantix.   Encounter for anticoagulation discussion and counseling Long discussion about anticoagulation,  stressed importance of stopping her anticoagulation for any traumatic injury.   Irritable bowel syndrome, unspecified type Previous C. difficile,    Total encounter time more than 25 minutes  Greater than 50% was spent in counseling and coordination of care with the patient   Disposition:   F/U  12 months   Orders Placed This Encounter  Procedures  . EKG 12-Lead     Signed, Esmond Plants, M.D., Ph.D. 12/10/2016  [place], [place]

## 2016-12-10 ENCOUNTER — Encounter: Payer: Self-pay | Admitting: Cardiovascular Disease

## 2016-12-10 ENCOUNTER — Ambulatory Visit (INDEPENDENT_AMBULATORY_CARE_PROVIDER_SITE_OTHER): Payer: Medicare Other | Admitting: Cardiovascular Disease

## 2016-12-10 VITALS — BP 120/68 | HR 53 | Ht 67.0 in | Wt 147.8 lb

## 2016-12-10 DIAGNOSIS — I480 Paroxysmal atrial fibrillation: Secondary | ICD-10-CM | POA: Diagnosis not present

## 2016-12-10 DIAGNOSIS — F17200 Nicotine dependence, unspecified, uncomplicated: Secondary | ICD-10-CM | POA: Diagnosis not present

## 2016-12-10 DIAGNOSIS — Z7189 Other specified counseling: Secondary | ICD-10-CM | POA: Diagnosis not present

## 2016-12-10 MED ORDER — NITROGLYCERIN 0.4 MG SL SUBL: 0.4000 mg | SUBLINGUAL_TABLET | SUBLINGUAL | 3 refills | Status: DC | PRN

## 2016-12-10 NOTE — Patient Instructions (Addendum)
We will get records from [facility] Scan, labs  Medication Instructions:   No medication changes made  Try Nitro as needed for chest pain For continued episodes, call the office  Labwork:  No new labs needed  Testing/Procedures:  No further testing at this time   I recommend watching educational videos on topics of interest to you at:       [email or web address]  Enter code: [email or web address]    Follow-Up: It was a pleasure seeing you in the office today. Please call us if you have new issues that need to be addressed before your next appt.  [phone]  Your physician wants you to follow-up in: 6 months.  Please call for any further chest pain You will receive a reminder letter in the mail two months in advance. If you don't receive a letter, please call our office to schedule the follow-up appointment.  If you need a refill on your cardiac medications before your next appointment, please call your pharmacy.

## 2017-02-06 DIAGNOSIS — F5102 Adjustment insomnia: Secondary | ICD-10-CM | POA: Diagnosis not present

## 2017-02-06 DIAGNOSIS — I482 Chronic atrial fibrillation: Secondary | ICD-10-CM | POA: Diagnosis not present

## 2017-02-06 DIAGNOSIS — F411 Generalized anxiety disorder: Secondary | ICD-10-CM | POA: Diagnosis not present

## 2017-02-15 DIAGNOSIS — H2511 Age-related nuclear cataract, right eye: Secondary | ICD-10-CM | POA: Diagnosis not present

## 2017-02-26 ENCOUNTER — Other Ambulatory Visit: Payer: Self-pay | Admitting: Internal Medicine

## 2017-03-11 ENCOUNTER — Telehealth: Payer: Self-pay | Admitting: Cardiovascular Disease

## 2017-03-11 DIAGNOSIS — L578 Other skin changes due to chronic exposure to nonionizing radiation: Secondary | ICD-10-CM | POA: Diagnosis not present

## 2017-03-11 DIAGNOSIS — Z85828 Personal history of other malignant neoplasm of skin: Secondary | ICD-10-CM | POA: Diagnosis not present

## 2017-03-11 DIAGNOSIS — L820 Inflamed seborrheic keratosis: Secondary | ICD-10-CM | POA: Diagnosis not present

## 2017-03-11 DIAGNOSIS — L821 Other seborrheic keratosis: Secondary | ICD-10-CM | POA: Diagnosis not present

## 2017-03-11 NOTE — Telephone Encounter (Signed)
Xarelto 20 mg tablet placed at front desk for pick up. 

## 2017-03-11 NOTE — Telephone Encounter (Signed)
Pt calling stating this Thursday she will be going on a cruise for 10 days And is going to run out on her Xarelto 2 days before  She is asking since she is in donut hole if we had any samples she could have  Please advise.

## 2017-04-04 DIAGNOSIS — H25813 Combined forms of age-related cataract, bilateral: Secondary | ICD-10-CM | POA: Diagnosis not present

## 2017-04-19 DIAGNOSIS — N390 Urinary tract infection, site not specified: Secondary | ICD-10-CM | POA: Diagnosis not present

## 2017-04-19 DIAGNOSIS — F411 Generalized anxiety disorder: Secondary | ICD-10-CM | POA: Diagnosis not present

## 2017-04-19 DIAGNOSIS — H2513 Age-related nuclear cataract, bilateral: Secondary | ICD-10-CM | POA: Diagnosis not present

## 2017-04-19 DIAGNOSIS — I10 Essential (primary) hypertension: Secondary | ICD-10-CM | POA: Diagnosis not present

## 2017-04-19 DIAGNOSIS — H11009 Unspecified pterygium of unspecified eye: Secondary | ICD-10-CM | POA: Diagnosis not present

## 2017-04-19 DIAGNOSIS — H18413 Arcus senilis, bilateral: Secondary | ICD-10-CM | POA: Diagnosis not present

## 2017-04-23 ENCOUNTER — Ambulatory Visit: Admit: 2017-04-23 | Payer: Medicare Other | Admitting: Ophthalmology

## 2017-04-23 SURGERY — PHACOEMULSIFICATION, CATARACT, WITH IOL INSERTION
Anesthesia: Choice | Laterality: Left

## 2017-04-27 ENCOUNTER — Other Ambulatory Visit: Payer: Self-pay | Admitting: Cardiovascular Disease

## 2017-05-09 DIAGNOSIS — H2513 Age-related nuclear cataract, bilateral: Secondary | ICD-10-CM | POA: Diagnosis not present

## 2017-05-09 DIAGNOSIS — H2512 Age-related nuclear cataract, left eye: Secondary | ICD-10-CM | POA: Diagnosis not present

## 2017-05-10 DIAGNOSIS — Z23 Encounter for immunization: Secondary | ICD-10-CM | POA: Diagnosis not present

## 2017-05-10 DIAGNOSIS — R2231 Localized swelling, mass and lump, right upper limb: Secondary | ICD-10-CM | POA: Diagnosis not present

## 2017-05-10 DIAGNOSIS — F411 Generalized anxiety disorder: Secondary | ICD-10-CM | POA: Diagnosis not present

## 2017-05-10 DIAGNOSIS — I482 Chronic atrial fibrillation: Secondary | ICD-10-CM | POA: Diagnosis not present

## 2017-05-10 DIAGNOSIS — Z0001 Encounter for general adult medical examination with abnormal findings: Secondary | ICD-10-CM | POA: Diagnosis not present

## 2017-05-13 ENCOUNTER — Other Ambulatory Visit: Payer: Self-pay | Admitting: Internal Medicine

## 2017-05-13 ENCOUNTER — Telehealth: Payer: Self-pay | Admitting: Cardiovascular Disease

## 2017-05-13 DIAGNOSIS — H2513 Age-related nuclear cataract, bilateral: Secondary | ICD-10-CM | POA: Diagnosis not present

## 2017-05-13 DIAGNOSIS — H11003 Unspecified pterygium of eye, bilateral: Secondary | ICD-10-CM | POA: Diagnosis not present

## 2017-05-13 DIAGNOSIS — Z1231 Encounter for screening mammogram for malignant neoplasm of breast: Secondary | ICD-10-CM

## 2017-05-13 DIAGNOSIS — H18413 Arcus senilis, bilateral: Secondary | ICD-10-CM | POA: Diagnosis not present

## 2017-05-13 DIAGNOSIS — H25013 Cortical age-related cataract, bilateral: Secondary | ICD-10-CM | POA: Diagnosis not present

## 2017-05-13 NOTE — Telephone Encounter (Signed)
Received cardiac clearance from [place] scheduled for 06/10/17 with Dr. Talbert Forest. Procedure is Cataract Extraction with Intraocular Lens implantation of the left eye followed by the Right eye. Patient is on Xarelto and form does states Topical Anesthesia. Please route clearance to [phone] to attention Sharyn Lull.

## 2017-05-13 NOTE — Telephone Encounter (Signed)
Acceptable risk for procedure IF xarelto needs to be help (she should check with eye doctor), it is ok to hold for 2 days prior.

## 2017-05-14 NOTE — Telephone Encounter (Signed)
Clearance routed to number provided.  

## 2017-05-21 DIAGNOSIS — M25521 Pain in right elbow: Secondary | ICD-10-CM | POA: Diagnosis not present

## 2017-05-21 DIAGNOSIS — M79601 Pain in right arm: Secondary | ICD-10-CM | POA: Diagnosis not present

## 2017-05-21 DIAGNOSIS — S46211A Strain of muscle, fascia and tendon of other parts of biceps, right arm, initial encounter: Secondary | ICD-10-CM | POA: Diagnosis not present

## 2017-05-22 ENCOUNTER — Other Ambulatory Visit: Payer: Self-pay | Admitting: Orthopedic Surgery

## 2017-05-22 ENCOUNTER — Telehealth: Payer: Self-pay

## 2017-05-22 ENCOUNTER — Telehealth: Payer: Self-pay | Admitting: Cardiovascular Disease

## 2017-05-22 DIAGNOSIS — M25521 Pain in right elbow: Secondary | ICD-10-CM

## 2017-05-22 DIAGNOSIS — S46211A Strain of muscle, fascia and tendon of other parts of biceps, right arm, initial encounter: Secondary | ICD-10-CM

## 2017-05-22 DIAGNOSIS — M79601 Pain in right arm: Secondary | ICD-10-CM

## 2017-05-22 NOTE — Telephone Encounter (Signed)
Spoke with patient and reviewed that she should check with her eye surgeon and that we sent over clearance already to their office. Reviewed that if they want her to hold it should be 2 days before but to check with them first because typically they do not hold this medication for that type of surgery. She did want to know about other pain medication options but advised she may want to check with her PCP or eye doctor for other options. She verbalized understanding with no further questions at this time.

## 2017-05-22 NOTE — Telephone Encounter (Signed)
Patient notified samples available of Xarelto 20 mg Lot# [id number] Exp. 3/21, gave 3 bottles of 7 tablets in each.

## 2017-05-22 NOTE — Telephone Encounter (Signed)
Patient calling the office for samples of medication:   1.  What medication and dosage are you requesting samples for? Xarelto  20 MG  2.  Are you currently out of this medication? Not yet, very close - called last week and we were out   Pt is also having surgery next week and has some questions about medication Please call to advise

## 2017-05-22 NOTE — Telephone Encounter (Signed)
Patient is having eye surgery for Pterygium next Thursday, Nov. 8, 2018.  She was told to take Percocet but she is not sure if she should be taking with Xarelto.  She also was told she may need to come off prior to the eye surgery.   Please advise what recommendations are.

## 2017-05-23 ENCOUNTER — Other Ambulatory Visit: Payer: Self-pay | Admitting: Unknown Physician Specialty

## 2017-05-25 ENCOUNTER — Ambulatory Visit
Admission: RE | Admit: 2017-05-25 | Discharge: 2017-05-25 | Disposition: A | Discharge status: Home / Self Care | Payer: Medicare Other | Source: Ambulatory Visit | Attending: Orthopedic Surgery | Admitting: Orthopedic Surgery

## 2017-05-25 DIAGNOSIS — M25521 Pain in right elbow: Secondary | ICD-10-CM

## 2017-05-25 DIAGNOSIS — M79601 Pain in right arm: Secondary | ICD-10-CM

## 2017-05-25 DIAGNOSIS — S46211A Strain of muscle, fascia and tendon of other parts of biceps, right arm, initial encounter: Secondary | ICD-10-CM | POA: Diagnosis not present

## 2017-05-25 DIAGNOSIS — X58XXXA Exposure to other specified factors, initial encounter: Secondary | ICD-10-CM | POA: Diagnosis not present

## 2017-06-07 DIAGNOSIS — S46211A Strain of muscle, fascia and tendon of other parts of biceps, right arm, initial encounter: Secondary | ICD-10-CM | POA: Diagnosis not present

## 2017-06-09 DIAGNOSIS — H11002 Unspecified pterygium of left eye: Secondary | ICD-10-CM | POA: Diagnosis not present

## 2017-06-17 DIAGNOSIS — Z85828 Personal history of other malignant neoplasm of skin: Secondary | ICD-10-CM | POA: Diagnosis not present

## 2017-06-17 DIAGNOSIS — L578 Other skin changes due to chronic exposure to nonionizing radiation: Secondary | ICD-10-CM | POA: Diagnosis not present

## 2017-06-17 DIAGNOSIS — L820 Inflamed seborrheic keratosis: Secondary | ICD-10-CM | POA: Diagnosis not present

## 2017-06-17 DIAGNOSIS — D692 Other nonthrombocytopenic purpura: Secondary | ICD-10-CM | POA: Diagnosis not present

## 2017-06-20 DIAGNOSIS — H11001 Unspecified pterygium of right eye: Secondary | ICD-10-CM | POA: Diagnosis not present

## 2017-07-05 DIAGNOSIS — H2512 Age-related nuclear cataract, left eye: Secondary | ICD-10-CM | POA: Diagnosis not present

## 2017-07-05 DIAGNOSIS — H2511 Age-related nuclear cataract, right eye: Secondary | ICD-10-CM | POA: Diagnosis not present

## 2017-07-08 ENCOUNTER — Ambulatory Visit
Admission: RE | Admit: 2017-07-08 | Discharge: 2017-07-08 | Disposition: A | Discharge status: Home / Self Care | Payer: Medicare Other | Source: Ambulatory Visit | Attending: Internal Medicine | Admitting: Internal Medicine

## 2017-07-08 DIAGNOSIS — Z1231 Encounter for screening mammogram for malignant neoplasm of breast: Secondary | ICD-10-CM | POA: Diagnosis not present

## 2017-07-11 ENCOUNTER — Other Ambulatory Visit: Payer: Self-pay | Admitting: Cardiovascular Disease

## 2017-07-15 ENCOUNTER — Other Ambulatory Visit: Payer: Self-pay | Admitting: Nurse Practitioner

## 2017-07-15 ENCOUNTER — Other Ambulatory Visit: Payer: Self-pay | Admitting: Cardiovascular Disease

## 2017-07-15 DIAGNOSIS — F419 Anxiety disorder, unspecified: Secondary | ICD-10-CM

## 2017-07-15 MED ORDER — ALPRAZOLAM 0.5 MG PO TABS: ORAL_TABLET | ORAL | 2 refills | Status: DC

## 2017-07-15 NOTE — Progress Notes (Signed)
approfed alprazolam 0.25mg  tablets 1/2 to 1 tablet BID prn #45 tab;ets  With 2 refills

## 2017-07-18 DIAGNOSIS — H10021 Other mucopurulent conjunctivitis, right eye: Secondary | ICD-10-CM | POA: Diagnosis not present

## 2017-07-22 DIAGNOSIS — H2511 Age-related nuclear cataract, right eye: Secondary | ICD-10-CM | POA: Diagnosis not present

## 2017-07-26 ENCOUNTER — Other Ambulatory Visit: Payer: Self-pay | Admitting: Cardiovascular Disease

## 2017-07-26 DIAGNOSIS — S46211D Strain of muscle, fascia and tendon of other parts of biceps, right arm, subsequent encounter: Secondary | ICD-10-CM | POA: Diagnosis not present

## 2017-07-26 NOTE — Telephone Encounter (Signed)
Refill Request.  

## 2017-08-13 DIAGNOSIS — R29898 Other symptoms and signs involving the musculoskeletal system: Secondary | ICD-10-CM | POA: Diagnosis not present

## 2017-08-13 DIAGNOSIS — M25521 Pain in right elbow: Secondary | ICD-10-CM | POA: Diagnosis not present
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
## 2017-08-14 ENCOUNTER — Ambulatory Visit (INDEPENDENT_AMBULATORY_CARE_PROVIDER_SITE_OTHER): Payer: Medicare Other | Admitting: Cardiovascular Disease

## 2017-08-14 ENCOUNTER — Encounter: Payer: Self-pay | Admitting: Cardiovascular Disease

## 2017-08-14 VITALS — BP 110/56 | HR 63 | Ht 68.0 in | Wt 132.5 lb

## 2017-08-14 DIAGNOSIS — Z7189 Other specified counseling: Secondary | ICD-10-CM

## 2017-08-14 DIAGNOSIS — I4891 Unspecified atrial fibrillation: Secondary | ICD-10-CM

## 2017-08-14 DIAGNOSIS — R000 Tachycardia, unspecified: Secondary | ICD-10-CM

## 2017-08-14 DIAGNOSIS — I480 Paroxysmal atrial fibrillation: Secondary | ICD-10-CM | POA: Diagnosis not present

## 2017-08-14 DIAGNOSIS — R079 Chest pain, unspecified: Secondary | ICD-10-CM | POA: Diagnosis not present

## 2017-08-14 DIAGNOSIS — F17200 Nicotine dependence, unspecified, uncomplicated: Secondary | ICD-10-CM | POA: Diagnosis not present

## 2017-08-14 MED ORDER — FLECAINIDE ACETATE 50 MG PO TABS: 75.0000 mg | ORAL_TABLET | Freq: Two times a day (BID) | ORAL | 3 refills | Status: DC

## 2017-08-14 MED ORDER — RIVAROXABAN 20 MG PO TABS: 20.0000 mg | ORAL_TABLET | Freq: Every day | ORAL | 4 refills | Status: DC

## 2017-08-14 NOTE — Patient Instructions (Addendum)
Medication Instructions:   No medication changes made  Consider increasing the flecainide up to 75 mg twice a day for atrial fibrillation  Labwork:  No new labs needed  Testing/Procedures:  We will schedule a liver and lipids Fasting    Follow-Up: It was a pleasure seeing you in the office today. Please call us if you have new issues that need to be addressed before your next appt.  [phone]  Your physician wants you to follow-up in: 6 months.  You will receive a reminder letter in the mail two months in advance. If you don't receive a letter, please call our office to schedule the follow-up appointment.  If you need a refill on your cardiac medications before your next appointment, please call your pharmacy.

## 2017-08-20 DIAGNOSIS — M25521 Pain in right elbow: Secondary | ICD-10-CM | POA: Diagnosis not present

## 2017-08-22 DIAGNOSIS — M25521 Pain in right elbow: Secondary | ICD-10-CM | POA: Diagnosis not present

## 2017-08-26 DIAGNOSIS — M25521 Pain in right elbow: Secondary | ICD-10-CM | POA: Diagnosis not present

## 2017-08-29 DIAGNOSIS — M25521 Pain in right elbow: Secondary | ICD-10-CM | POA: Diagnosis not present

## 2017-08-30 DIAGNOSIS — H04123 Dry eye syndrome of bilateral lacrimal glands: Secondary | ICD-10-CM | POA: Diagnosis not present

## 2017-09-02 DIAGNOSIS — L820 Inflamed seborrheic keratosis: Secondary | ICD-10-CM | POA: Diagnosis not present

## 2017-09-02 DIAGNOSIS — L821 Other seborrheic keratosis: Secondary | ICD-10-CM | POA: Diagnosis not present

## 2017-09-02 DIAGNOSIS — L578 Other skin changes due to chronic exposure to nonionizing radiation: Secondary | ICD-10-CM | POA: Diagnosis not present

## 2017-09-02 DIAGNOSIS — L812 Freckles: Secondary | ICD-10-CM | POA: Diagnosis not present

## 2017-09-02 DIAGNOSIS — D229 Melanocytic nevi, unspecified: Secondary | ICD-10-CM | POA: Diagnosis not present

## 2017-09-02 DIAGNOSIS — Z85828 Personal history of other malignant neoplasm of skin: Secondary | ICD-10-CM | POA: Diagnosis not present

## 2017-09-02 DIAGNOSIS — Z1283 Encounter for screening for malignant neoplasm of skin: Secondary | ICD-10-CM | POA: Diagnosis not present

## 2017-09-02 DIAGNOSIS — L720 Epidermal cyst: Secondary | ICD-10-CM | POA: Diagnosis not present

## 2017-09-03 DIAGNOSIS — M25521 Pain in right elbow: Secondary | ICD-10-CM | POA: Diagnosis not present

## 2017-09-05 DIAGNOSIS — M25521 Pain in right elbow: Secondary | ICD-10-CM | POA: Diagnosis not present

## 2017-09-09 ENCOUNTER — Telehealth: Payer: Self-pay

## 2017-09-09 ENCOUNTER — Other Ambulatory Visit: Payer: Self-pay

## 2017-09-09 MED ORDER — FAMCICLOVIR 500 MG PO TABS: 500.0000 mg | ORAL_TABLET | Freq: Two times a day (BID) | ORAL | 0 refills | Status: AC | PRN

## 2017-09-09 NOTE — Telephone Encounter (Signed)
Pt called saying she has a fever blister on her bottom lip.  Spoke with Dr. Clayborn Bigness and as per [facility] an rx for famvir 500 mg BID prn for 7 days #30 was sent in.

## 2017-09-09 NOTE — Telephone Encounter (Signed)
There was an error in my message previously.   As per Dr. Clayborn Bigness, the medication that was sent in was Famvir 500 mg BID for 7 days and then as needed with #30.

## 2017-09-11 DIAGNOSIS — M25521 Pain in right elbow: Secondary | ICD-10-CM | POA: Diagnosis not present

## 2017-09-11 IMAGING — CT CT ABDOMEN W/ CM
2 of 5 series · 16 of 46 positions shown, 18 images · IV contrast (ISOVUE 300)
Comparison: No prior CT abdomen.  12/10/2014 coronary CT.

CLINICAL DATA: 70-year-old female with intermittent upper abdominal
pain and tenderness for 3 weeks. Prior appendectomy and
hysterectomy.

EXAM:
CT ABDOMEN WITH CONTRAST
TECHNIQUE: Multidetector CT imaging of the abdomen was performed using the
standard protocol following bolus administration of intravenous
contrast.
CONTRAST:  100mL [id number] IOPAMIDOL (8F169D-Y22) INJECTION 61%

[Series 2: abd/ pelvis · axial · 0.74mm/px · z∈[+986,+1180]mm · 13 of 45 slices shown, 15 images]
[im 3/45  soft-tissue]
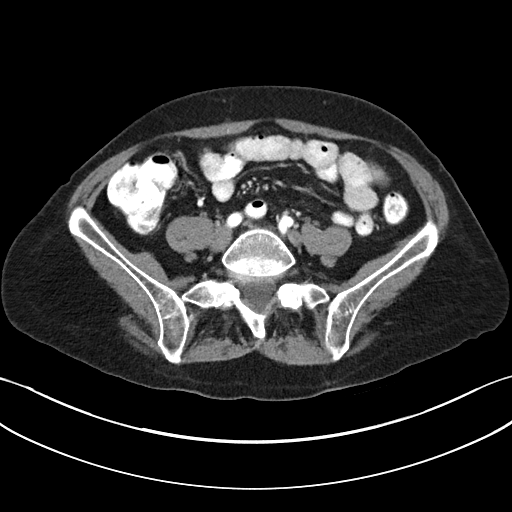
[im 3/45  bone]
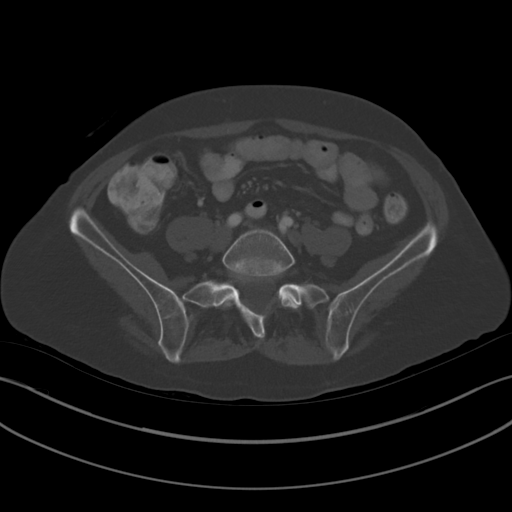
[im 6/45  soft-tissue]
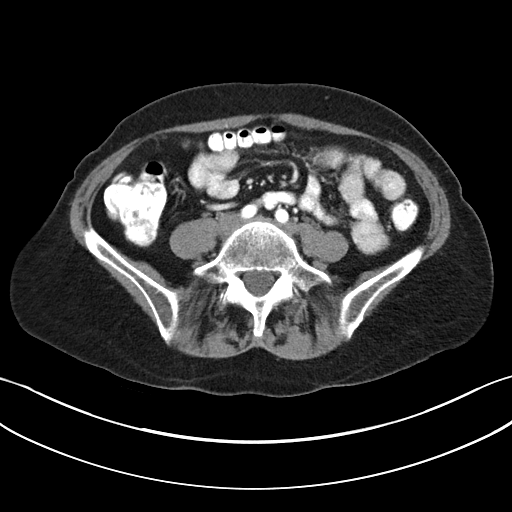
[im 9/45  soft-tissue]
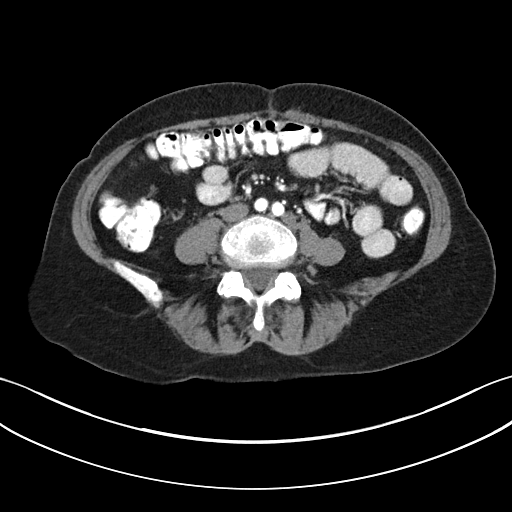
[im 14/45  soft-tissue]
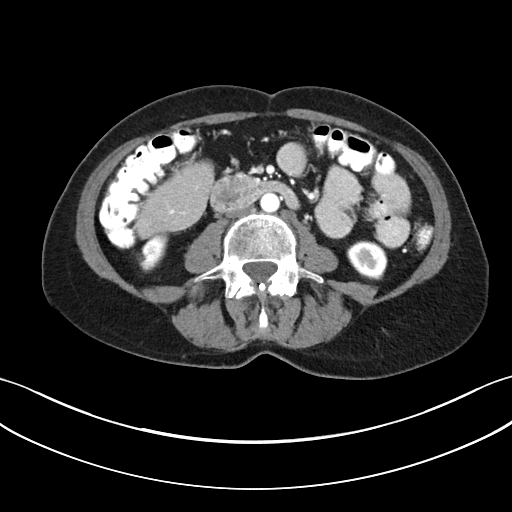
[im 17/45  soft-tissue]
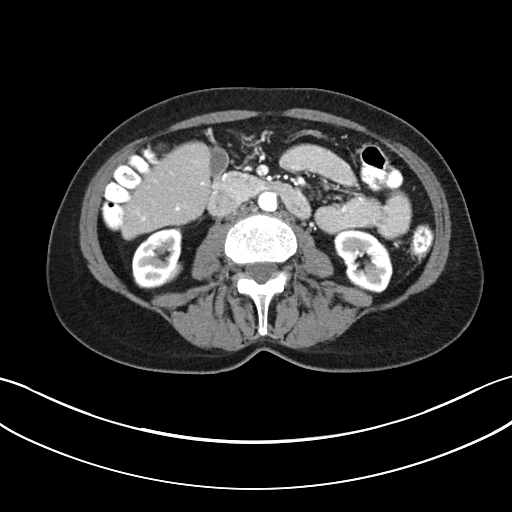
[im 20/45  soft-tissue]
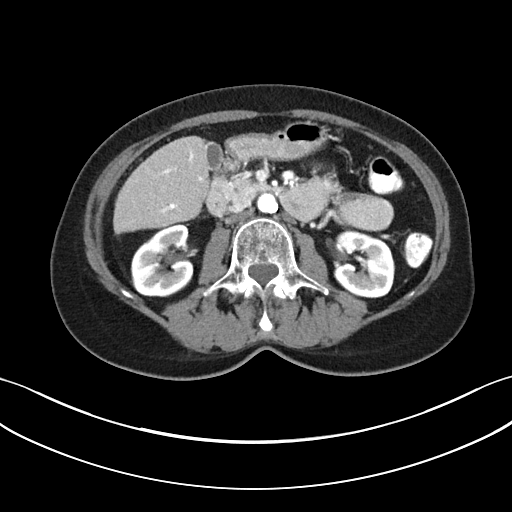
[im 23/45  soft-tissue]
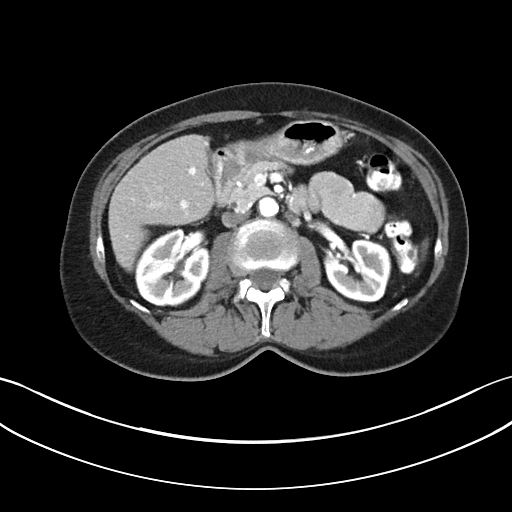
[im 25/45  soft-tissue]
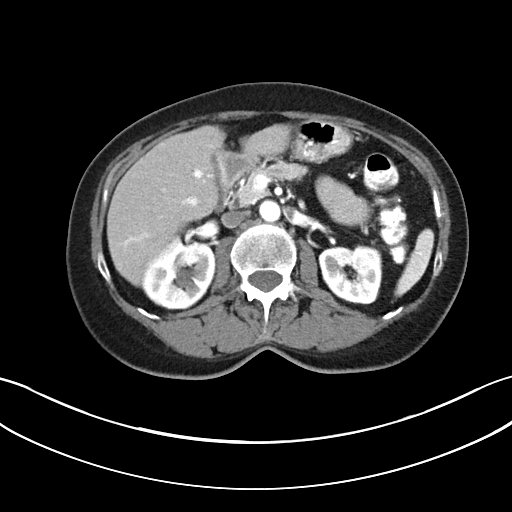
[im 28/45  soft-tissue]
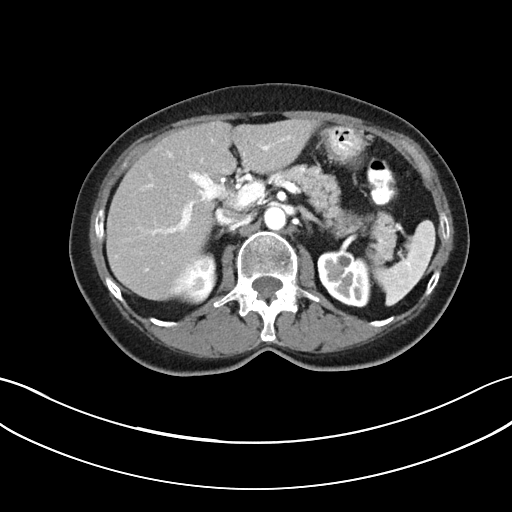
[im 28/45  bone]
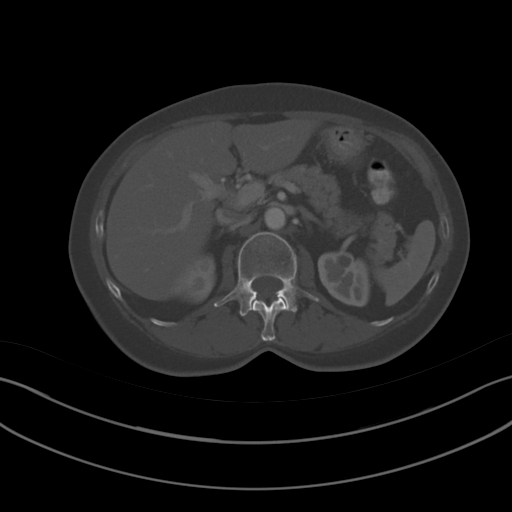
[im 31/45  soft-tissue]
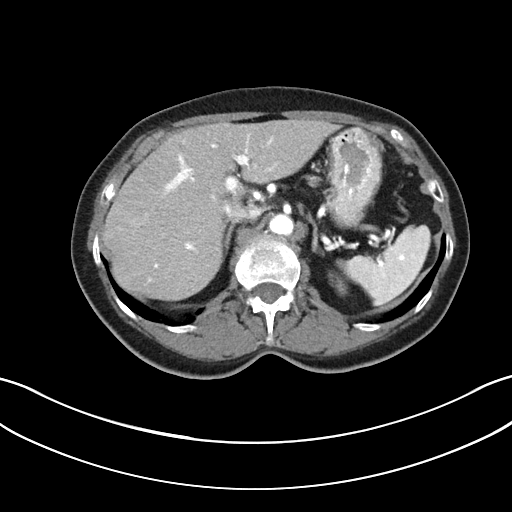
[im 36/45  soft-tissue]
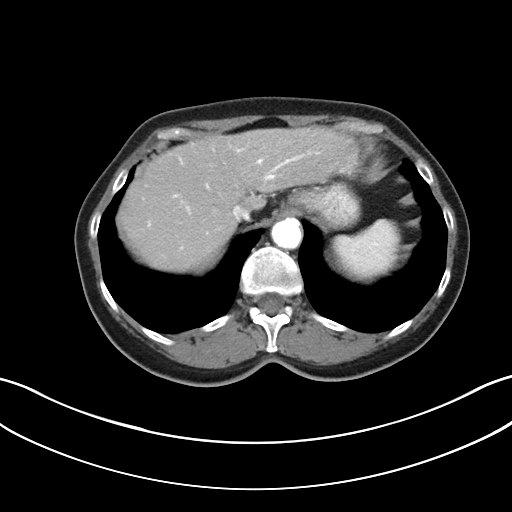
[im 39/45  soft-tissue]
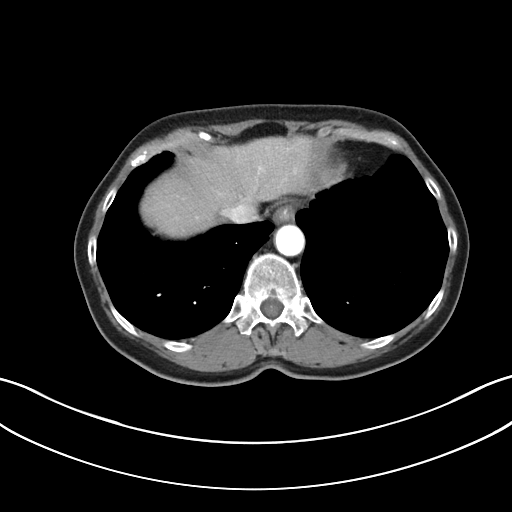
[im 42/45  soft-tissue]
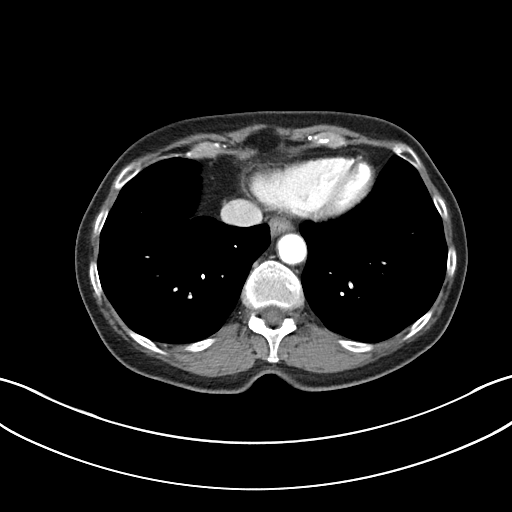

[Series 5: coronal soft tissue · coronal · 0.45mm/px · 3 of 101 slices shown]
[im 34/101  soft-tissue]
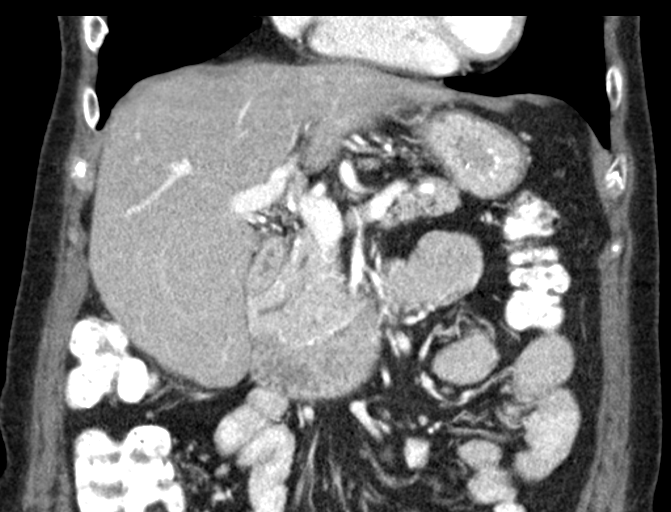
[im 45/101  soft-tissue]
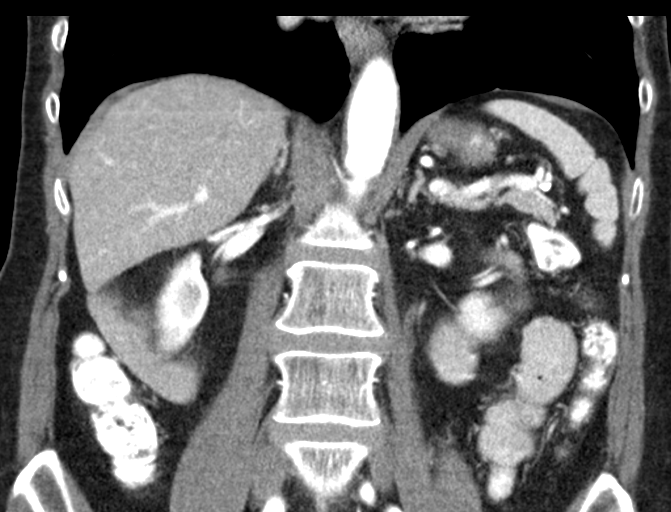
[im 56/101  soft-tissue]
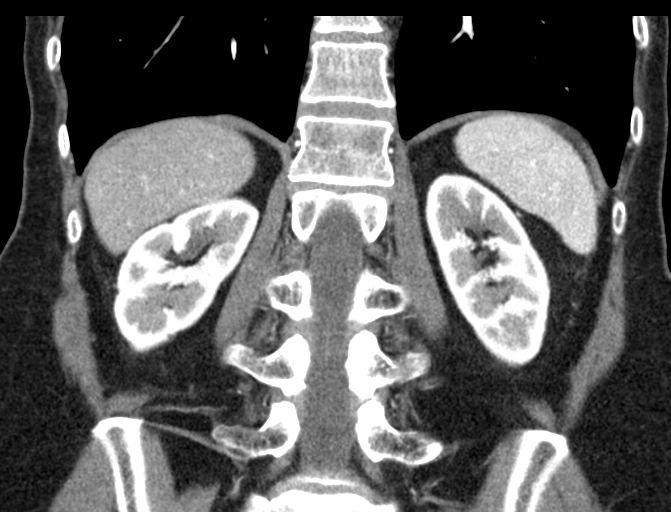

[16 of 46 positions shown; findings below may reference images not displayed]

FINDINGS: Lower chest: No significant pulmonary nodules or acute consolidative
airspace disease.

Hepatobiliary: Normal liver with no liver mass. Normal gallbladder
with no radiopaque cholelithiasis. No biliary ductal dilatation.

Pancreas: Probable pancreas divisum, with drainage of the main
pancreatic duct via an accessory duct of Krzyszfof, with no evidence
of communication between the main pancreatic duct and common bile
duct. Otherwise normal pancreas with no pancreatic mass or
pancreatic duct dilation.

Spleen: Normal size. No mass.

Adrenals/Urinary Tract: Normal adrenals. Simple 1.1 cm lower right
renal cyst. Simple 1.0 cm upper left renal cyst. No hydronephrosis.

Stomach/Bowel: Grossly normal stomach. Visualized small and large
bowel is normal caliber, with no bowel wall thickening.

Vascular/Lymphatic: Atherosclerotic nonaneurysmal abdominal aorta.
Patent portal, splenic, hepatic and renal veins. Duplicated IVC
below the level of the renal veins. No pathologically enlarged lymph
nodes in the abdomen.

Other: No pneumoperitoneum, ascites or focal fluid collection.

Musculoskeletal: No aggressive appearing focal osseous lesions.
IMPRESSION: 1. No acute abnormality in the abdomen. No evidence of bowel
obstruction or acute bowel inflammation. No hydronephrosis. No
radiopaque cholelithiasis. No biliary ductal dilatation.
2. Probable pancreas divisum.
3. Aortic atherosclerosis.

## 2017-09-13 DIAGNOSIS — M25521 Pain in right elbow: Secondary | ICD-10-CM | POA: Diagnosis not present

## 2017-09-16 DIAGNOSIS — M25521 Pain in right elbow: Secondary | ICD-10-CM | POA: Diagnosis not present

## 2017-09-17 DIAGNOSIS — F4323 Adjustment disorder with mixed anxiety and depressed mood: Secondary | ICD-10-CM | POA: Diagnosis not present

## 2017-09-19 ENCOUNTER — Other Ambulatory Visit: Payer: Self-pay | Admitting: Internal Medicine

## 2017-09-19 DIAGNOSIS — F4323 Adjustment disorder with mixed anxiety and depressed mood: Secondary | ICD-10-CM | POA: Diagnosis not present

## 2017-11-04 ENCOUNTER — Telehealth: Payer: Self-pay | Admitting: Nurse Practitioner

## 2017-11-04 DIAGNOSIS — F419 Anxiety disorder, unspecified: Secondary | ICD-10-CM

## 2017-11-04 MED ORDER — ALPRAZOLAM 0.5 MG PO TABS: ORAL_TABLET | ORAL | 2 refills | Status: DC

## 2017-11-04 NOTE — Telephone Encounter (Signed)
rx refill

## 2017-11-11 ENCOUNTER — Ambulatory Visit (INDEPENDENT_AMBULATORY_CARE_PROVIDER_SITE_OTHER): Payer: Medicare Other | Admitting: Nurse Practitioner

## 2017-11-11 ENCOUNTER — Encounter: Payer: Self-pay | Admitting: Nurse Practitioner

## 2017-11-11 VITALS — BP 114/59 | HR 52 | Resp 16 | Ht 68.0 in | Wt 132.0 lb

## 2017-11-11 DIAGNOSIS — R319 Hematuria, unspecified: Secondary | ICD-10-CM | POA: Diagnosis not present

## 2017-11-11 DIAGNOSIS — F411 Generalized anxiety disorder: Secondary | ICD-10-CM | POA: Diagnosis not present

## 2017-11-11 DIAGNOSIS — R300 Dysuria: Secondary | ICD-10-CM | POA: Diagnosis not present

## 2017-11-11 DIAGNOSIS — F419 Anxiety disorder, unspecified: Secondary | ICD-10-CM | POA: Diagnosis not present

## 2017-11-11 DIAGNOSIS — N390 Urinary tract infection, site not specified: Secondary | ICD-10-CM | POA: Diagnosis not present

## 2017-11-11 LAB — POCT URINALYSIS DIPSTICK
BILIRUBIN UA: NEGATIVE
GLUCOSE UA: NEGATIVE
Ketones, UA: NEGATIVE
LEUKOCYTES UA: NEGATIVE
Nitrite, UA: NEGATIVE
PH UA: 6 (ref 5.0–8.0)
Protein, UA: NEGATIVE
Spec Grav, UA: 1.02 (ref 1.010–1.025)
UROBILINOGEN UA: 0.2 U/dL

## 2017-11-11 MED ORDER — ALPRAZOLAM 0.5 MG PO TABS: ORAL_TABLET | ORAL | 3 refills | Status: DC

## 2017-11-11 MED ORDER — ESCITALOPRAM OXALATE 10 MG PO TABS: 10.0000 mg | ORAL_TABLET | Freq: Every day | ORAL | 5 refills | Status: DC

## 2017-11-11 NOTE — Progress Notes (Signed)
[facility] [place], [place] [address]  Internal MEDICINE  Office Visit Note  Patient Name: [name]  [id number]  [id number]  Date of Service: 11/27/2017    Pt is here for routine follow up.   Chief Complaint  Patient presents with  . Urinary Tract Infection  . Depression    The patient is here for routine follow up visit. Today, she is reporting improvement in her depression. She and her husband recently returned from 37 week trip out [place] them to work on marital issues. Adding lexapro has helped a great deal. Helps to take the edge of her anxiety and depression. Still takes alprazolam at night to help her sleep. She does need to have refills today.      Current Medication: Outpatient Encounter Medications as of 11/11/2017  Medication Sig  . ALPRAZolam (XANAX) 0.5 MG tablet Take 1/2 to 1 tablet po BID prn anxiety  . Coenzyme Q10 (COQ10 PO) Take 1 tablet by mouth daily.  Marland Kitchen diltiazem (CARDIZEM) 30 MG tablet Take 1 tablet (30 mg total) by mouth 3 (three) times daily as needed (for breakthrough atrial fibrillation). (Patient taking differently: Take 30 mg by mouth as needed (for breakthrough atrial fibrillation). )  . escitalopram (LEXAPRO) 10 MG tablet Take 1 tablet (10 mg total) by mouth daily.  . famotidine (PEPCID) 20 MG tablet TAKE 2 TABLETS BY MOUTH TWICE A DAY BEFORE BREAKFAST AND SUPPER.  . flecainide (TAMBOCOR) 50 MG tablet Take 1.5 tablets (75 mg total) by mouth 2 (two) times daily.  . metoprolol succinate (TOPROL-XL) 25 MG 24 hr tablet TAKE 1 TABLET BY MOUTH ONCE DAILY  . pantoprazole (PROTONIX) 40 MG tablet Take 40 mg by mouth as needed.   . propranolol (INDERAL) 20 MG tablet Take 1 tablet (20 mg total) by mouth 3 (three) times daily as needed (for breakthrough atrial fibrillation). (Patient taking differently: Take 40 mg by mouth as needed (for breakthrough atrial fibrillation). )  . rivaroxaban (XARELTO) 20 MG TABS tablet Take 1 tablet  (20 mg total) by mouth daily with supper.  . rosuvastatin (CRESTOR) 5 MG tablet TAKE 1 TABLET BY MOUTH ONCE DAILY (Patient taking differently: TAKE 1 TABLET BY MOUTH EVERY OTHER DAY.)  . [DISCONTINUED] ALPRAZolam (XANAX) 0.5 MG tablet Take 1/2 to 1 tablet po BID prn anxiety  . [DISCONTINUED] ALPRAZolam (XANAX) 0.5 MG tablet Take 1/2 to 1 tablet po BID prn anxiety  . [DISCONTINUED] escitalopram (LEXAPRO) 10 MG tablet   . [DISCONTINUED] escitalopram (LEXAPRO) 10 MG tablet Take 1 tablet (10 mg total) by mouth daily.  Marland Kitchen amoxicillin-clavulanate (AUGMENTIN) 875-125 MG tablet Take 1 tablet by mouth 2 (two) times daily.  . diphenoxylate-atropine (LOMOTIL) 2.5-0.025 MG tablet Take 1 tablet by mouth 4 (four) times daily as needed for diarrhea or loose stools. (Patient not taking: Reported on 11/11/2017)  . famotidine (PEPCID) 40 MG tablet Take 1 tablet (40 mg total) by mouth 2 (two) times daily before a meal. Breakfast and supper (Patient not taking: Reported on 11/11/2017)  . nitroGLYCERIN (NITROSTAT) 0.4 MG SL tablet Place 1 tablet (0.4 mg total) under the tongue every 5 (five) minutes as needed for chest pain.  . Probiotic Product (VSL#3 DS PO) Take by mouth as needed.    No facility-administered encounter medications on file as of 11/11/2017.     Surgical History: Past Surgical History:  Procedure Laterality Date  . APPENDECTOMY  1963  . BREAST CYST ASPIRATION Bilateral    "several; both  sides"  . CARDIOVERSION  01/16/2012   Procedure: CARDIOVERSION;  Surgeon: Evans Lance, MD;  Location: [facility];  Service: Cardiovascular;  Laterality: N/A;  . COLONOSCOPY  Multiple  . CYSTOSCOPY W/ STONE MANIPULATION  1980's  . EYE SURGERY     x4  . LEG SURGERY     skin cancer removal  . OVARIAN CYST REMOVAL  1990's  . SHOULDER ARTHROSCOPY W/ ROTATOR CUFF REPAIR Right ~ 2005  . SQUAMOUS CELL CARCINOMA EXCISION     "10-12 removed; all over my body - nose, predominately legs"  . TONSILLECTOMY AND ADENOIDECTOMY   1975  . UPPER GASTROINTESTINAL ENDOSCOPY  Multiple   w/dilation, hiatal hernia  . VAGINAL HYSTERECTOMY  1972    Medical History: Past Medical History:  Diagnosis Date  . Arthritis    "fingers; right shoulder"  . Atrial fibrillation ([place])    a. diagnosed 12/2011  . Broken ribs   . C. difficile diarrhea   . Cataract   . Collagenous colitis   . Diverticulosis   . Esophageal candidiasis ([place])   . GERD (gastroesophageal reflux disease)   . Heart murmur   . Hiatal hernia 2004  . Hyperlipidemia   . IBS (irritable bowel syndrome)   . Internal hemorrhoids 2010   Colon-Dr. Vira Agar   . Kidney stones   . Midsternal chest pain    a. 01/2011 Ex Mv: EF 68%, No ischemia.  . Migraine 01/16/12   "none now for several years"  . Mitral valve disorders(424.0)    a. 12/2011 Echo: EF 55-60%, NRWMA, No evidence of MVP, Mild MR  . Osteoporosis   . Pancreas divisum of native pancreas    probable by CT  . Schatzki's ring 2010    EGD- Dr. Vira Agar   . Squamous cell carcinoma of skin    multiple  . Ulcerative colitis ([place])     Family History: Family History  Problem Relation Age of Onset  . Heart disease Father        A Fib and CHF - died in Mid 49  . Diabetes Father   . Kidney disease Father   . Irritable bowel syndrome Father   . Alzheimer's disease Mother        Died in [place] 55's.  . Colon cancer Neg Hx     Social History   Socioeconomic History  . Marital status: Married    Spouse name: Not on file  . Number of children: 1  . Years of education: Not on file  . Highest education level: Not on file  Occupational History  . Occupation: retired  Scientific laboratory technician  . Financial resource strain: Not on file  . Food insecurity:    Worry: Not on file    Inability: Not on file  . Transportation needs:    Medical: Not on file    Non-medical: Not on file  Tobacco Use  . Smoking status: Former Smoker    Packs/day: 0.10    Years: 40.00    Pack years: 4.00    Types: Cigarettes    Last  attempt to quit: 01/16/2012    Years since quitting: 5.8  . Smokeless tobacco: Never Used  Substance and Sexual Activity  . Alcohol use: Yes    Alcohol/week: 0.0 oz    Comment: rare  . Drug use: No  . Sexual activity: Not on file  Lifestyle  . Physical activity:    Days per week: Not on file    Minutes per session: Not  on file  . Stress: Not on file  Relationships  . Social connections:    Talks on phone: Not on file    Gets together: Not on file    Attends religious service: Not on file    Active member of club or organization: Not on file    Attends meetings of clubs or organizations: Not on file    Relationship status: Not on file  . Intimate partner violence:    Fear of current or ex partner: Not on file    Emotionally abused: Not on file    Physically abused: Not on file    Forced sexual activity: Not on file  Other Topics Concern  . Not on file  Social History Narrative   Lives in [place] with husband.   She is retired   One daughter, 2 grandsons   Spends a fair amount of leisure time at [place]  Constitutional: Negative for activity change, chills, fatigue and unexpected weight change.  HENT: Negative for congestion, postnasal drip, rhinorrhea, sneezing, sore throat and voice change.   Eyes: Negative.  Negative for redness.  Respiratory: Negative for cough, chest tightness, shortness of breath and wheezing.   Cardiovascular: Negative for chest pain and palpitations.  Gastrointestinal: Negative for abdominal pain, constipation, diarrhea, nausea and vomiting.  Endocrine: Negative for cold intolerance, heat intolerance, polydipsia, polyphagia and polyuria.  Genitourinary: Positive for dysuria and frequency.  Musculoskeletal: Positive for back pain. Negative for arthralgias, joint swelling and neck pain.  Skin: Negative for rash.  Allergic/Immunologic: Negative for environmental allergies.  Neurological: Negative for dizziness, tremors,  numbness and headaches.  Hematological: Negative for adenopathy. Does not bruise/bleed easily.  Psychiatric/Behavioral: Positive for dysphoric mood. Negative for behavioral problems (Depression), sleep disturbance and suicidal ideas. The patient is nervous/anxious.     Today's Vitals   11/11/17 1153  BP: (!) 114/59  Pulse: (!) 52  Resp: 16  SpO2: 99%  Weight: 132 lb (59.9 kg)  Height: 5\' 8"  (1.727 m)    Physical Exam  Constitutional: She is oriented to person, place, and time. She appears well-developed and well-nourished. No distress.  HENT:  Head: Normocephalic and atraumatic.  Mouth/Throat: Oropharynx is clear and moist. No oropharyngeal exudate.  Eyes: Pupils are equal, round, and reactive to light. EOM are normal.  Neck: Normal range of motion. Neck supple. No JVD present. No tracheal deviation present. No thyromegaly present.  Cardiovascular: Normal rate, regular rhythm and normal heart sounds. Exam reveals no gallop and no friction rub.  No murmur heard. Pulmonary/Chest: Effort normal and breath sounds normal. No respiratory distress. She has no wheezes. She has no rales. She exhibits no tenderness.  Abdominal: Soft. Bowel sounds are normal. There is no tenderness.  Genitourinary:  Genitourinary Comments: Urine sample positive for trace blood.   Musculoskeletal: Normal range of motion.  Lymphadenopathy:    She has no cervical adenopathy.  Neurological: She is alert and oriented to person, place, and time. No cranial nerve deficit.  Skin: Skin is warm and dry. She is not diaphoretic.  Psychiatric: She has a normal mood and affect. Her behavior is normal. Judgment and thought content normal.  Nursing note and vitals reviewed.  Assessment/Plan: 1. Urinary tract infection with hematuria, site unspecified Trace blood present on u/a. Treat with augmentin 875mg  twice daily for 10 days. Adjust abx as indicated based on results of culture and sensitivity.  - CULTURE, URINE  COMPREHENSIVE - amoxicillin-clavulanate (AUGMENTIN) 875-125 MG  tablet; Take 1 tablet by mouth 2 (two) times daily.  Dispense: 20 tablet; Refill: 0  2. Dysuria - POCT Urinalysis Dipstick  3. Generalized anxiety disorder Stable. Improved. conitnue lexapro 64mmg daily. Refill provided today.  - escitalopram (LEXAPRO) 10 MG tablet; Take 1 tablet (10 mg total) by mouth daily.  Dispense: 30 tablet; Refill: 5  4. Anxiety Ok to continue alprazolam 0.5mg , taking 1/2 to 1 tablet twice daily if needed. New prescription sent to her pharmacy.  - ALPRAZolam (XANAX) 0.5 MG tablet; Take 1/2 to 1 tablet po BID prn anxiety  Dispense: 45 tablet; Refill: 3  General Counseling: xuan mateus understanding of the findings of todays visit and agrees with plan of treatment. I have discussed any further diagnostic evaluation that may be needed or ordered today. We also reviewed her medications today. she has been encouraged to call the office with any questions or concerns that should arise related to todays visit.  This patient was seen by Leretha Pol, FNP- C in Collaboration with Dr [name] as a part of collaborative care agreement    Orders Placed This Encounter  Procedures  . CULTURE, URINE COMPREHENSIVE  . POCT Urinalysis Dipstick    Meds ordered this encounter  Medications  . DISCONTD: ALPRAZolam (XANAX) 0.5 MG tablet    Sig: Take 1/2 to 1 tablet po BID prn anxiety    Dispense:  45 tablet    Refill:  3    Order Specific Question:   Supervising Provider    Answer:   [name] [place]  . DISCONTD: escitalopram (LEXAPRO) 10 MG tablet    Sig: Take 1 tablet (10 mg total) by mouth daily.    Dispense:  30 tablet    Refill:  5    Order Specific Question:   Supervising Provider    Answer:   [name] [[id number]]  . ALPRAZolam (XANAX) 0.5 MG tablet    Sig: Take 1/2 to 1 tablet po BID prn anxiety    Dispense:  45 tablet    Refill:  3    Order Specific Question:   Supervising Provider     Answer:   [name] [place]  . escitalopram (LEXAPRO) 10 MG tablet    Sig: Take 1 tablet (10 mg total) by mouth daily.    Dispense:  30 tablet    Refill:  5    Order Specific Question:   Supervising Provider    Answer:   [name] [[id number]]  . amoxicillin-clavulanate (AUGMENTIN) 875-125 MG tablet    Sig: Take 1 tablet by mouth 2 (two) times daily.    Dispense:  20 tablet    Refill:  0    Order Specific Question:   Supervising Provider    Answer:   [name] [[id number]]    Time spent: 48 Minutes     Dr [name] Internal medicine

## 2017-11-13 LAB — CULTURE, URINE COMPREHENSIVE

## 2017-11-18 MED ORDER — AMOXICILLIN-POT CLAVULANATE 875-125 MG PO TABS: 1.0000 | ORAL_TABLET | Freq: Two times a day (BID) | ORAL | 0 refills | Status: DC

## 2017-11-18 NOTE — Progress Notes (Signed)
Please let the patient know that urine taken at her last appointment did show infection with normal flora. I added augmentin 875mg  for her to take twice daily for 10 days. Sent this to [facility]. thanks

## 2017-11-26 DIAGNOSIS — C44722 Squamous cell carcinoma of skin of right lower limb, including hip: Secondary | ICD-10-CM | POA: Diagnosis not present

## 2017-11-26 DIAGNOSIS — L578 Other skin changes due to chronic exposure to nonionizing radiation: Secondary | ICD-10-CM | POA: Diagnosis not present

## 2017-11-26 DIAGNOSIS — D485 Neoplasm of uncertain behavior of skin: Secondary | ICD-10-CM | POA: Diagnosis not present

## 2017-11-26 DIAGNOSIS — Z85828 Personal history of other malignant neoplasm of skin: Secondary | ICD-10-CM | POA: Diagnosis not present

## 2017-11-27 DIAGNOSIS — N390 Urinary tract infection, site not specified: Secondary | ICD-10-CM | POA: Insufficient documentation

## 2017-11-27 DIAGNOSIS — R319 Hematuria, unspecified: Secondary | ICD-10-CM

## 2017-11-27 DIAGNOSIS — R300 Dysuria: Secondary | ICD-10-CM | POA: Insufficient documentation

## 2017-11-27 DIAGNOSIS — F411 Generalized anxiety disorder: Secondary | ICD-10-CM | POA: Insufficient documentation

## 2017-11-27 NOTE — Progress Notes (Signed)
Pt was notified.  

## 2017-12-18 DIAGNOSIS — H16223 Keratoconjunctivitis sicca, not specified as Sjogren's, bilateral: Secondary | ICD-10-CM | POA: Diagnosis not present

## 2017-12-18 DIAGNOSIS — Z9889 Other specified postprocedural states: Secondary | ICD-10-CM | POA: Diagnosis not present

## 2018-01-02 DIAGNOSIS — L578 Other skin changes due to chronic exposure to nonionizing radiation: Secondary | ICD-10-CM | POA: Diagnosis not present

## 2018-01-02 DIAGNOSIS — D485 Neoplasm of uncertain behavior of skin: Secondary | ICD-10-CM | POA: Diagnosis not present

## 2018-01-02 DIAGNOSIS — Z85828 Personal history of other malignant neoplasm of skin: Secondary | ICD-10-CM | POA: Diagnosis not present

## 2018-01-02 DIAGNOSIS — D692 Other nonthrombocytopenic purpura: Secondary | ICD-10-CM | POA: Diagnosis not present

## 2018-01-02 DIAGNOSIS — L820 Inflamed seborrheic keratosis: Secondary | ICD-10-CM | POA: Diagnosis not present

## 2018-01-02 DIAGNOSIS — L570 Actinic keratosis: Secondary | ICD-10-CM | POA: Diagnosis not present

## 2018-01-02 DIAGNOSIS — L821 Other seborrheic keratosis: Secondary | ICD-10-CM | POA: Diagnosis not present

## 2018-01-14 ENCOUNTER — Other Ambulatory Visit: Payer: Self-pay | Admitting: Cardiovascular Disease

## 2018-02-03 DIAGNOSIS — F4323 Adjustment disorder with mixed anxiety and depressed mood: Secondary | ICD-10-CM | POA: Diagnosis not present

## 2018-02-17 DIAGNOSIS — F4323 Adjustment disorder with mixed anxiety and depressed mood: Secondary | ICD-10-CM | POA: Diagnosis not present

## 2018-03-03 DIAGNOSIS — F4323 Adjustment disorder with mixed anxiety and depressed mood: Secondary | ICD-10-CM | POA: Diagnosis not present

## 2018-03-11 DIAGNOSIS — F4323 Adjustment disorder with mixed anxiety and depressed mood: Secondary | ICD-10-CM | POA: Diagnosis not present

## 2018-03-18 ENCOUNTER — Telehealth: Payer: Self-pay

## 2018-03-18 ENCOUNTER — Other Ambulatory Visit: Payer: Self-pay | Admitting: Cardiovascular Disease

## 2018-03-18 DIAGNOSIS — F4323 Adjustment disorder with mixed anxiety and depressed mood: Secondary | ICD-10-CM | POA: Diagnosis not present

## 2018-03-18 NOTE — Telephone Encounter (Signed)
Pt would like Xarelto samples.  

## 2018-03-18 NOTE — Telephone Encounter (Signed)
I spoke with the patient and advised her that I have placed Xarelto samples at the front desk for her.  Confirmed with the patient that she is on the 20 mg tablets.  Xarelto 20 mg Lot: [id number] Exp: 6/21 # 3 bottles given

## 2018-03-21 ENCOUNTER — Telehealth: Payer: Self-pay | Admitting: Internal Medicine

## 2018-03-21 NOTE — Telephone Encounter (Signed)
Patient with RUQ pain.  She will come in and see Tye Savoy RNP on 03/25/18 1:30.  She will go to the ED if her symptoms worsen between now and appt next week.

## 2018-03-25 ENCOUNTER — Encounter: Payer: Self-pay | Admitting: Nurse Practitioner

## 2018-03-25 ENCOUNTER — Telehealth: Payer: Self-pay | Admitting: Nurse Practitioner

## 2018-03-25 ENCOUNTER — Other Ambulatory Visit (INDEPENDENT_AMBULATORY_CARE_PROVIDER_SITE_OTHER): Payer: Medicare Other

## 2018-03-25 ENCOUNTER — Ambulatory Visit (INDEPENDENT_AMBULATORY_CARE_PROVIDER_SITE_OTHER): Payer: Medicare Other | Admitting: Nurse Practitioner

## 2018-03-25 VITALS — BP 110/60 | HR 64 | Ht 66.75 in | Wt 133.5 lb

## 2018-03-25 DIAGNOSIS — R143 Flatulence: Secondary | ICD-10-CM

## 2018-03-25 DIAGNOSIS — R197 Diarrhea, unspecified: Secondary | ICD-10-CM | POA: Diagnosis not present

## 2018-03-25 DIAGNOSIS — R1011 Right upper quadrant pain: Secondary | ICD-10-CM

## 2018-03-25 DIAGNOSIS — R140 Abdominal distension (gaseous): Secondary | ICD-10-CM | POA: Diagnosis not present

## 2018-03-25 LAB — HEPATIC FUNCTION PANEL
ALK PHOS: 44 U/L (ref 39–117)
ALT: 11 U/L (ref 0–35)
AST: 15 U/L (ref 0–37)
Albumin: 4 g/dL (ref 3.5–5.2)
BILIRUBIN DIRECT: 0.1 mg/dL (ref 0.0–0.3)
TOTAL PROTEIN: 6.6 g/dL (ref 6.0–8.3)
Total Bilirubin: 0.3 mg/dL (ref 0.2–1.2)

## 2018-03-25 NOTE — Patient Instructions (Signed)
If you are age 72 or older, your body mass index should be between 23-30. Your Body mass index is 21.07 kg/m. If this is out of the aforementioned range listed, please consider follow up with your Primary Care Provider.  If you are age 86 or younger, your body mass index should be between 19-25. Your Body mass index is 21.07 kg/m. If this is out of the aformentioned range listed, please consider follow up with your Primary Care Provider.   Your provider has requested that you go to the basement level for lab work before leaving today. Press "B" on the elevator. The lab is located at the first door on the left as you exit the elevator.  You have been scheduled for an abdominal ultrasound at [facility] (1st floor of hospital) on 03/28/18 at 10:30 am. Please arrive 15 minutes prior to your appointment for registration. Make certain not to have anything to eat or drink 6-8 hours prior to your appointment. Should you need to reschedule your appointment, please contact radiology at [phone]. This test typically takes about 30 minutes to perform.  We have sent the following medications to your pharmacy for you to pick up at your convenience: VSL#3 - over the counter at [place] two twice daily for 2 weeks, then 2 daily for one month.  Thank you for choosing me and [facility].   Tye Savoy, NP

## 2018-03-25 NOTE — Progress Notes (Signed)
CBC   P Primary GI:   [name], MD       Chief Complaint:    Abdominal pain  / gas / bowel changes.   IMPRESSION and PLAN:    #1.  72 yo female with acute RUQ pain radiating around to back with associated nausea. Pain not positional, it awoke her from sleep 5 days ago. Hard to know if worse with eating though recurrent pain today after eating lunch. Need to consider gallbladder etiology.  -check liver chemistries -RUQ ultrasound -will call with results and any further recommendations  #2. Loose, malodorous stools / bloating and very malodorous gas. Not sure if related in some way to #1 though present for several weeks now. She has a history of C-diff and odor reminds her of such. Hx of collagenous colitis with biopsy proven flare in 2015 but the foul odor seems unusual. SIBO another consideration.  She has IBS with chronic, intermittent loose stool and this could be an exacerbation. -stool for c-diff -Will call with results, in interim recommend trial of probiotic Visbiome. If not available in stores she will try Align, coupon given.  -further workup pending above  #3. PAF, on Xarelto      HPI:     Patient is a 72 yo female with GI history of GERD with stricture and esophageal dilation, chronic IBS, collagenous colitis, and hx of C-diff.  She comes in with 5 days of RUQ pain radiating around to her back with associated nausea. At onset of pain she tried Pepto Bismul, 80 mg of Pepcid,  MOM, and GAS-X. Nothing helped, she suffered for three days. On Saturday she finally began feeling better. Because of longstandinng IBS she is very careful about diet and hadn't eaten anything unusual the night before. She isn't sure about fever but was "burning up" at times. No chills. She has chronic alternating bowels, avoids trigger foods but over the last several weeks she has noticed increased frequency of loose and malodorous stool. She feels bloated and lately having very malodorous gas. Odor  reminds her of when she had C-diff. No recent antibiotics.   Review of systems:     No chest pain, no SOB, no fevers, no urinary sx   Past Medical History:  Diagnosis Date  . Arthritis    "fingers; right shoulder"  . Atrial fibrillation ([place])    a. diagnosed 12/2011  . Broken ribs   . C. difficile diarrhea   . Cataract   . Collagenous colitis   . Diverticulosis   . Esophageal candidiasis ([place])   . GERD (gastroesophageal reflux disease)   . Heart murmur   . Hiatal hernia 2004  . Hyperlipidemia   . IBS (irritable bowel syndrome)   . Internal hemorrhoids 2010   Colon-Dr. Vira Agar   . Kidney stones   . Midsternal chest pain    a. 01/2011 Ex Mv: EF 68%, No ischemia.  . Migraine 01/16/12   "none now for several years"  . Mitral valve disorders(424.0)    a. 12/2011 Echo: EF 55-60%, NRWMA, No evidence of MVP, Mild MR  . Osteoporosis   . Pancreas divisum of native pancreas    probable by CT  . Pterygium eye, bilateral   . Schatzki's ring 2010    EGD- Dr. Vira Agar   . Squamous cell carcinoma of skin    multiple  . Ulcerative colitis ([place])     Patient's surgical history, family medical history, social history, medications and allergies were all reviewed in  Epic   Creatinine clearance cannot be calculated (Patient's most recent lab result is older than the maximum 21 days allowed.)  Current Outpatient Medications  Medication Sig Dispense Refill  . ALPRAZolam (XANAX) 0.5 MG tablet Take 1/2 to 1 tablet po BID prn anxiety 45 tablet 3  . Coenzyme Q10 (COQ10 PO) Take 1 tablet by mouth daily.    . diphenoxylate-atropine (LOMOTIL) 2.5-0.025 MG tablet Take 1 tablet by mouth 4 (four) times daily as needed for diarrhea or loose stools. 90 tablet 0  . escitalopram (LEXAPRO) 10 MG tablet Take 1 tablet (10 mg total) by mouth daily. 30 tablet 5  . famotidine (PEPCID) 40 MG tablet Take 1 tablet (40 mg total) by mouth 2 (two) times daily before a meal. Breakfast and supper 60 tablet 11  .  flecainide (TAMBOCOR) 50 MG tablet Take 1.5 tablets (75 mg total) by mouth 2 (two) times daily. 270 tablet 3  . metoprolol succinate (TOPROL-XL) 25 MG 24 hr tablet TAKE 1 TABLET BY MOUTH ONCE A DAY 30 tablet 0  . pantoprazole (PROTONIX) 40 MG tablet Take 40 mg by mouth as needed.     . Probiotic Product (VSL#3 DS PO) Take by mouth as needed.     . propranolol (INDERAL) 20 MG tablet Take 1 tablet (20 mg total) by mouth 3 (three) times daily as needed (for breakthrough atrial fibrillation). (Patient taking differently: Take 40 mg by mouth as needed (for breakthrough atrial fibrillation). ) 90 tablet 4  . rivaroxaban (XARELTO) 20 MG TABS tablet Take 1 tablet (20 mg total) by mouth daily with supper. 90 tablet 4  . rosuvastatin (CRESTOR) 5 MG tablet TAKE 1 TABLET BY MOUTH ONCE DAILY (Patient taking differently: TAKE 1 TABLET BY MOUTH EVERY OTHER DAY.) 90 tablet 3  . diltiazem (CARDIZEM) 30 MG tablet Take 1 tablet (30 mg total) by mouth 3 (three) times daily as needed (for breakthrough atrial fibrillation). (Patient not taking: Reported on 03/25/2018) 90 tablet 4  . nitroGLYCERIN (NITROSTAT) 0.4 MG SL tablet Place 1 tablet (0.4 mg total) under the tongue every 5 (five) minutes as needed for chest pain. 25 tablet 3   No current facility-administered medications for this visit.     Physical Exam:     BP 110/60 (BP Location: Left Arm, Patient Position: Sitting, Cuff Size: Normal)   Pulse 64   Ht 5' 6.75" (1.695 m)   Wt 133 lb 8 oz (60.6 kg)   BMI 21.07 kg/m   GENERAL:  Pleasant female in NAD PSYCH: : Cooperative, normal affect EENT:  conjunctiva pink, mucous membranes moist, neck supple without masses CARDIAC:  RRR, no murmur heard, no peripheral edema PULM: Normal respiratory effort, lungs CTA bilaterally, no wheezing ABDOMEN:  Nondistended, soft, mild RUQ tenderness.  No obvious masses, no hepatomegaly,  normal bowel sounds SKIN:  turgor, no lesions seen Musculoskeletal:  Normal muscle tone,  normal strength NEURO: Alert and oriented x 3, no focal neurologic deficits   [name] , NP 03/25/2018, 1:55 PM

## 2018-03-25 NOTE — Telephone Encounter (Signed)
Returned patients call regarding Align.  Patient instructed to take Align as directed on box.  Patient verbalized understanding.

## 2018-03-26 ENCOUNTER — Encounter: Payer: Self-pay | Admitting: Nurse Practitioner

## 2018-03-27 DIAGNOSIS — F4323 Adjustment disorder with mixed anxiety and depressed mood: Secondary | ICD-10-CM | POA: Diagnosis not present

## 2018-03-28 ENCOUNTER — Ambulatory Visit (HOSPITAL_COMMUNITY)
Admission: RE | Admit: 2018-03-28 | Discharge: 2018-03-28 | Disposition: A | Discharge status: Home / Self Care | Payer: Medicare Other | Source: Ambulatory Visit | Attending: Nurse Practitioner | Admitting: Nurse Practitioner

## 2018-03-28 DIAGNOSIS — R1011 Right upper quadrant pain: Secondary | ICD-10-CM

## 2018-03-31 ENCOUNTER — Telehealth: Payer: Self-pay

## 2018-03-31 NOTE — Telephone Encounter (Deleted)
-----   Message from Willia Craze, NP sent at 03/30/2018  4:29 PM EDT ----- [name], please let patient know that liver labs and ultrasound were okay. If pain not getting better then let me know. If getting better than no further work up needed. Thanks

## 2018-03-31 NOTE — Telephone Encounter (Signed)
Left message to call.

## 2018-03-31 NOTE — Telephone Encounter (Signed)
[name] The patient reports the Humphrey Rolls has made her feel less bloated. She remains very gassy.  The pain around her waist and her unpredictable bowel movements remain unchanged. She has "been like this for 90 percent of my life." Does not have daily diarrhea. Has not done test for c-diff and was not given test supplies by the lab for stool collection. She wants to know what else can be done. She also asks for a refill of Lomotil. Uses rarely. Last filled in Sept. Of 2017.

## 2018-03-31 NOTE — Telephone Encounter (Signed)
Beth, if still having diarrhea then needs c-diff done. The align will not work this quickly, it may take a month to good optimal results. Lomotil is fine to refill IF we know that C-diff is negative. Thanks

## 2018-03-31 NOTE — Telephone Encounter (Signed)
-----   Message from Willia Craze, NP sent at 03/30/2018  4:29 PM EDT ----- [name], please let patient know that liver labs and ultrasound were okay. If pain not getting better then let me know. If getting better than no further work up needed. Thanks

## 2018-04-01 DIAGNOSIS — F4323 Adjustment disorder with mixed anxiety and depressed mood: Secondary | ICD-10-CM | POA: Diagnosis not present

## 2018-04-02 ENCOUNTER — Other Ambulatory Visit: Payer: Self-pay

## 2018-04-02 DIAGNOSIS — R197 Diarrhea, unspecified: Secondary | ICD-10-CM

## 2018-04-02 DIAGNOSIS — L905 Scar conditions and fibrosis of skin: Secondary | ICD-10-CM | POA: Diagnosis not present

## 2018-04-02 DIAGNOSIS — L820 Inflamed seborrheic keratosis: Secondary | ICD-10-CM | POA: Diagnosis not present

## 2018-04-02 DIAGNOSIS — D485 Neoplasm of uncertain behavior of skin: Secondary | ICD-10-CM | POA: Diagnosis not present

## 2018-04-02 DIAGNOSIS — L720 Epidermal cyst: Secondary | ICD-10-CM | POA: Diagnosis not present

## 2018-04-02 DIAGNOSIS — Z85828 Personal history of other malignant neoplasm of skin: Secondary | ICD-10-CM | POA: Diagnosis not present

## 2018-04-02 DIAGNOSIS — L821 Other seborrheic keratosis: Secondary | ICD-10-CM | POA: Diagnosis not present

## 2018-04-02 DIAGNOSIS — L578 Other skin changes due to chronic exposure to nonionizing radiation: Secondary | ICD-10-CM | POA: Diagnosis not present

## 2018-04-02 NOTE — Telephone Encounter (Signed)
Explained to the patient. She is in agreement with this plan of care.

## 2018-04-03 ENCOUNTER — Other Ambulatory Visit: Payer: Medicare Other

## 2018-04-03 DIAGNOSIS — R197 Diarrhea, unspecified: Secondary | ICD-10-CM

## 2018-04-14 DIAGNOSIS — F4323 Adjustment disorder with mixed anxiety and depressed mood: Secondary | ICD-10-CM | POA: Diagnosis not present

## 2018-04-19 LAB — SPECIMEN STATUS REPORT

## 2018-04-24 ENCOUNTER — Telehealth: Payer: Self-pay | Admitting: Nurse Practitioner

## 2018-04-24 LAB — CLOSTRIDIUM DIFFICILE BY PCR

## 2018-04-24 NOTE — Telephone Encounter (Signed)
The patient was aware the specimen may not be accepted for testing when she turned it in. She states she has diarrhea off and on. She will have up to 3 stools a day. She will take Lomotil if she is bothered by the diarrhea. States this is how she has been for years. Appointment made for follow up with Dr Carlean Purl

## 2018-05-01 ENCOUNTER — Other Ambulatory Visit: Payer: Self-pay | Admitting: Cardiovascular Disease

## 2018-05-05 ENCOUNTER — Other Ambulatory Visit: Payer: Self-pay | Admitting: Cardiovascular Disease

## 2018-05-05 MED ORDER — METOPROLOL SUCCINATE ER 25 MG PO TB24: 25.0000 mg | ORAL_TABLET | Freq: Every day | ORAL | 1 refills | Status: DC

## 2018-05-05 NOTE — Telephone Encounter (Signed)
°*  STAT* If patient is at the pharmacy, call can be transferred to refill team.   1. Which medications need to be refilled? (please list name of each medication and dose if known) metoprolol   2. Which pharmacy/location (including street and city if local pharmacy) is medication to be sent to? [facility]   3. Do they need a 30 day or 90 day supply? 30 day

## 2018-05-07 DIAGNOSIS — Z961 Presence of intraocular lens: Secondary | ICD-10-CM | POA: Diagnosis not present

## 2018-05-16 ENCOUNTER — Other Ambulatory Visit: Payer: Self-pay | Admitting: Nurse Practitioner

## 2018-05-16 ENCOUNTER — Encounter: Payer: Self-pay | Admitting: Nurse Practitioner

## 2018-05-16 ENCOUNTER — Ambulatory Visit (INDEPENDENT_AMBULATORY_CARE_PROVIDER_SITE_OTHER): Payer: Medicare Other | Admitting: Nurse Practitioner

## 2018-05-16 VITALS — BP 133/62 | HR 58 | Resp 16 | Ht 67.5 in | Wt 136.4 lb

## 2018-05-16 DIAGNOSIS — Z1231 Encounter for screening mammogram for malignant neoplasm of breast: Secondary | ICD-10-CM

## 2018-05-16 DIAGNOSIS — I482 Chronic atrial fibrillation, unspecified: Secondary | ICD-10-CM | POA: Diagnosis not present

## 2018-05-16 DIAGNOSIS — R300 Dysuria: Secondary | ICD-10-CM | POA: Diagnosis not present

## 2018-05-16 DIAGNOSIS — Z0001 Encounter for general adult medical examination with abnormal findings: Secondary | ICD-10-CM | POA: Diagnosis not present

## 2018-05-16 DIAGNOSIS — E559 Vitamin D deficiency, unspecified: Secondary | ICD-10-CM | POA: Insufficient documentation

## 2018-05-16 DIAGNOSIS — E2839 Other primary ovarian failure: Secondary | ICD-10-CM | POA: Diagnosis not present

## 2018-05-16 DIAGNOSIS — Z23 Encounter for immunization: Secondary | ICD-10-CM | POA: Diagnosis not present

## 2018-05-16 DIAGNOSIS — Z1239 Encounter for other screening for malignant neoplasm of breast: Secondary | ICD-10-CM | POA: Diagnosis not present

## 2018-05-16 DIAGNOSIS — F419 Anxiety disorder, unspecified: Secondary | ICD-10-CM | POA: Diagnosis not present

## 2018-05-16 DIAGNOSIS — F411 Generalized anxiety disorder: Secondary | ICD-10-CM | POA: Diagnosis not present

## 2018-05-16 DIAGNOSIS — E782 Mixed hyperlipidemia: Secondary | ICD-10-CM

## 2018-05-16 MED ORDER — ALPRAZOLAM 0.5 MG PO TABS: ORAL_TABLET | ORAL | 1 refills | Status: DC

## 2018-05-16 MED ORDER — ESCITALOPRAM OXALATE 10 MG PO TABS: 10.0000 mg | ORAL_TABLET | Freq: Every day | ORAL | 3 refills | Status: AC

## 2018-05-16 NOTE — Progress Notes (Signed)
[facility] [place], [place] [address]  Internal MEDICINE  Office Visit Note  Patient Name: [name]  [id number]  [id number]  Date of Service: 05/16/2018   Pt is here for routine health maintenance examination   Chief Complaint  Patient presents with  . Medicare Wellness    well visit  . Hyperlipidemia  . Atrial Fibrillation  . Diarrhea    pt sees a Copy in [place] and has an appointment in november with the doctor.  but has been having issues, the rx given was not available until november  . Quality Metric Gaps    flu vaccine, pneumonia vaccine     The patient has long history of high blood pressure and a-fib. She sees cardiology. Currently on eliquis. Does need to have routine, fasting labs done. Has been several years since this has been done. She also has had generalized osteopenia. Tried to take oral biphosphates in the past, but was unable to tolerate them due to GI issues. Did get IV medication for osteoporosis in the past, but after a few years, this medication was d/c'd by insurance. Has not had bone density done since 2013.    Current Medication: Outpatient Encounter Medications as of 05/16/2018  Medication Sig Note  . ALPRAZolam (XANAX) 0.5 MG tablet Take 1/2 to 1 tablet po BID prn anxiety   . Coenzyme Q10 (COQ10 PO) Take 1 tablet by mouth daily.   . diphenoxylate-atropine (LOMOTIL) 2.5-0.025 MG tablet Take 1 tablet by mouth 4 (four) times daily as needed for diarrhea or loose stools.   Marland Kitchen escitalopram (LEXAPRO) 10 MG tablet Take 1 tablet (10 mg total) by mouth daily.   . famotidine (PEPCID) 40 MG tablet Take 1 tablet (40 mg total) by mouth 2 (two) times daily before a meal. Breakfast and supper   . flecainide (TAMBOCOR) 50 MG tablet Take 1.5 tablets (75 mg total) by mouth 2 (two) times daily.   . metoprolol succinate (TOPROL-XL) 25 MG 24 hr tablet Take 1 tablet (25 mg total) by mouth daily.   . pantoprazole (PROTONIX) 40 MG  tablet Take 40 mg by mouth as needed.    . Probiotic Product (VSL#3 DS PO) Take by mouth as needed.    . propranolol (INDERAL) 20 MG tablet Take 1 tablet (20 mg total) by mouth 3 (three) times daily as needed (for breakthrough atrial fibrillation). (Patient taking differently: Take 40 mg by mouth as needed (for breakthrough atrial fibrillation). )   . rivaroxaban (XARELTO) 20 MG TABS tablet Take 1 tablet (20 mg total) by mouth daily with supper.   . rosuvastatin (CRESTOR) 5 MG tablet TAKE 1 TABLET BY MOUTH ONCE DAILY (Patient taking differently: TAKE 1 TABLET BY MOUTH EVERY OTHER DAY.)   . [DISCONTINUED] ALPRAZolam (XANAX) 0.5 MG tablet Take 1/2 to 1 tablet po BID prn anxiety   . [DISCONTINUED] escitalopram (LEXAPRO) 10 MG tablet Take 1 tablet (10 mg total) by mouth daily.   Marland Kitchen diltiazem (CARDIZEM) 30 MG tablet Take 1 tablet (30 mg total) by mouth 3 (three) times daily as needed (for breakthrough atrial fibrillation). (Patient not taking: Reported on 03/25/2018) 03/25/2018: On hand   . nitroGLYCERIN (NITROSTAT) 0.4 MG SL tablet Place 1 tablet (0.4 mg total) under the tongue every 5 (five) minutes as needed for chest pain.   . [DISCONTINUED] famotidine (PEPCID) 20 MG tablet TAKE 2 TABLETS BY MOUTH TWICE A DAY BEFORE BREAKFAST AND SUPPER.   . [DISCONTINUED] metoprolol succinate (TOPROL-XL) 25 MG  24 hr tablet TAKE 1 TABLET BY MOUTH ONCE DAILY    No facility-administered encounter medications on file as of 05/16/2018.     Surgical History: Past Surgical History:  Procedure Laterality Date  . APPENDECTOMY  1963  . BREAST CYST ASPIRATION Bilateral    "several; both sides"  . CARDIOVERSION  01/16/2012   Procedure: CARDIOVERSION;  Surgeon: Evans Lance, MD;  Location: [facility];  Service: Cardiovascular;  Laterality: N/A;  . COLONOSCOPY  Multiple  . CYSTOSCOPY W/ STONE MANIPULATION  1980's  . EYE SURGERY     x4  . LEG SURGERY     skin cancer removal  . OVARIAN CYST REMOVAL  1990's  . SHOULDER  ARTHROSCOPY W/ ROTATOR CUFF REPAIR Right ~ 2005  . SQUAMOUS CELL CARCINOMA EXCISION     "10-12 removed; all over my body - nose, predominately legs"  . TONSILLECTOMY AND ADENOIDECTOMY  1975  . UPPER GASTROINTESTINAL ENDOSCOPY  Multiple   w/dilation, hiatal hernia  . VAGINAL HYSTERECTOMY  1972    Medical History: Past Medical History:  Diagnosis Date  . Arthritis    "fingers; right shoulder"  . Atrial fibrillation ([place])    a. diagnosed 12/2011  . Broken ribs   . C. difficile diarrhea   . Cataract   . Collagenous colitis   . Diverticulosis   . Esophageal candidiasis ([place])   . GERD (gastroesophageal reflux disease)   . Heart murmur   . Hiatal hernia 2004  . Hyperlipidemia   . IBS (irritable bowel syndrome)   . Internal hemorrhoids 2010   Colon-Dr. Vira Agar   . Kidney stones   . Midsternal chest pain    a. 01/2011 Ex Mv: EF 68%, No ischemia.  . Migraine 01/16/12   "none now for several years"  . Mitral valve disorders(424.0)    a. 12/2011 Echo: EF 55-60%, NRWMA, No evidence of MVP, Mild MR  . Osteoporosis   . Pancreas divisum of native pancreas    probable by CT  . Pterygium eye, bilateral   . Schatzki's ring 2010    EGD- Dr. Vira Agar   . Squamous cell carcinoma of skin    multiple  . Ulcerative colitis ([place])     Family History: Family History  Problem Relation Age of Onset  . Heart disease Father        A Fib and CHF - died in Mid 28  . Diabetes Father   . Kidney disease Father   . Irritable bowel syndrome Father   . Alzheimer's disease Mother        Died in [place] 71's.  . Colon cancer Neg Hx       Review of Systems  Constitutional: Negative for chills, fatigue and unexpected weight change.  HENT: Negative for congestion, postnasal drip, rhinorrhea, sneezing and sore throat.   Eyes: Negative.  Negative for redness.  Respiratory: Negative for cough, chest tightness, shortness of breath and wheezing.   Cardiovascular: Negative for chest pain and palpitations.   Gastrointestinal: Positive for diarrhea. Negative for abdominal pain, constipation, nausea and vomiting.       Currently seeing GI provider. Has improved with increased fiber intake.   Endocrine: Negative for cold intolerance, heat intolerance, polydipsia, polyphagia and polyuria.  Genitourinary: Negative.  Negative for dysuria and frequency.  Musculoskeletal: Negative for arthralgias, back pain, joint swelling and neck pain.  Skin: Negative for rash.  Allergic/Immunologic: Negative for environmental allergies.  Neurological: Negative for dizziness, tremors, numbness and headaches.  Hematological: Negative for adenopathy.  Does not bruise/bleed easily.  Psychiatric/Behavioral: Negative for behavioral problems (Depression), sleep disturbance and suicidal ideas. The patient is nervous/anxious.        Well-managed with current medications.      Today's Vitals   05/16/18 1017  BP: 133/62  Pulse: (!) 58  Resp: 16  SpO2: 99%  Weight: 136 lb 6.4 oz (61.9 kg)  Height: 5' 7.5" (1.715 m)    Physical Exam  Constitutional: She is oriented to person, place, and time. She appears well-developed and well-nourished. No distress.  HENT:  Head: Normocephalic and atraumatic.  Nose: Nose normal.  Mouth/Throat: Oropharynx is clear and moist. No oropharyngeal exudate.  Eyes: Pupils are equal, round, and reactive to light. Conjunctivae and EOM are normal.  Neck: Normal range of motion. Neck supple. No JVD present. Carotid bruit is not present. No tracheal deviation present. No thyromegaly present.  Cardiovascular: Normal rate, regular rhythm, normal heart sounds and intact distal pulses. Exam reveals no gallop and no friction rub.  No murmur heard. Pulmonary/Chest: Effort normal and breath sounds normal. No respiratory distress. She has no wheezes. She has no rales. She exhibits no tenderness. Right breast exhibits no inverted nipple, no mass, no nipple discharge, no skin change and no tenderness. Left  breast exhibits no inverted nipple, no mass, no nipple discharge, no skin change and no tenderness.  Abdominal: Soft. Bowel sounds are normal. There is no tenderness.  Musculoskeletal: Normal range of motion.  Lymphadenopathy:    She has no cervical adenopathy.  Neurological: She is alert and oriented to person, place, and time. No cranial nerve deficit.  Skin: Skin is warm and dry. Capillary refill takes less than 2 seconds. She is not diaphoretic.  Psychiatric: She has a normal mood and affect. Her behavior is normal. Judgment and thought content normal.  Nursing note and vitals reviewed.  Depression screen PHQ 2/9 11/11/2017  Decreased Interest 0  Down, Depressed, Hopeless 0  PHQ - 2 Score 0    Functional Status Survey: Is the patient deaf or have difficulty hearing?: No Does the patient have difficulty seeing, even when wearing glasses/contacts?: No(dry eyes but can see better than ever) Does the patient have difficulty concentrating, remembering, or making decisions?: No Does the patient have difficulty walking or climbing stairs?: No Does the patient have difficulty dressing or bathing?: No Does the patient have difficulty doing errands alone such as visiting a doctor's office or shopping?: No  MMSE - [place] Exam 05/16/2018  Orientation to time 5  Orientation to Place 5  Registration 3  Attention/ Calculation 5  Recall 3  Language- name 2 objects 2  Language- repeat 1  Language- follow 3 step command 3  Language- read & follow direction 1  Write a sentence 1  Copy design 1  Total score 30    Fall Risk  05/16/2018 11/11/2017  Falls in the past year? No No     LABS: Recent Results (from the past 2160 hour(s))  Hepatic function panel     Status: None   Collection Time: 03/25/18  2:32 PM  Result Value Ref Range   Total Bilirubin 0.3 0.2 - 1.2 mg/dL   Bilirubin, Direct 0.1 0.0 - 0.3 mg/dL   Alkaline Phosphatase 44 39 - 117 U/L   AST 15 0 - 37 U/L   ALT  11 0 - 35 U/L   Total Protein 6.6 6.0 - 8.3 g/dL   Albumin 4.0 3.5 - 5.2 g/dL  Clostridium Difficile by PCR  Status: None   Collection Time: 04/03/18 11:58 AM  Result Value Ref Range   Toxigenic C. Difficile by PCR CANCELED     Comment: Specimen frozen. Please resubmit at room temperature if clinically indicated.  Result canceled by the ancillary.   Specimen status report     Status: None (Preliminary result)   Collection Time: 04/03/18 11:58 AM  Result Value Ref Range   specimen status report Comment     Comment: No Clean Vial Stool Received    Assessment/Plan: 1. Encounter for general adult medical examination with abnormal findings Annual health maintenance exam today. Routine, fasting labs ordered.  - CBC with Differential/Platelet - Comprehensive metabolic panel - T4, free - TSH  2. Chronic atrial fibrillation Stable. Continue regular visits with cardiology as scheduled.  - CBC with Differential/Platelet - Comprehensive metabolic panel  3. Generalized anxiety disorder Continue lexapro 10mg  daily.  - escitalopram (LEXAPRO) 10 MG tablet; Take 1 tablet (10 mg total) by mouth daily.  Dispense: 90 tablet; Refill: 3  4. Ovarian failure - DG Bone Density; Future - T4, free - TSH  5. Screening for breast cancer Screening mammogram to be ordered.   6. Need for vaccination against Streptococcus pneumoniae using pneumococcal conjugate vaccine 13 - Pneumococcal conjugate vaccine 13-valent IM  7. Dysuria - UA/M w/rflx Culture, Routine  8. Anxiety May take alprazolam 0.5mg  up to twice daiy as needed for acute anxiety. New prescription sent to her pharmacy.  - ALPRAZolam (XANAX) 0.5 MG tablet; Take 1/2 to 1 tablet po BID prn anxiety  Dispense: 135 tablet; Refill: 1  9. Vitamin D deficiency - Vitamin D 1,25 dihydroxy  10. Mixed hyperlipidemia - Lipid panel  General Counseling: [name] understanding of the findings of todays visit and agrees with plan of  treatment. I have discussed any further diagnostic evaluation that may be needed or ordered today. We also reviewed her medications today. she has been encouraged to call the office with any questions or concerns that should arise related to todays visit.    Counseling:  This patient was seen by Leretha Pol FNP Collaboration with Dr [name] as a part of collaborative care agreement  Orders Placed This Encounter  Procedures  . DG Bone Density  . Pneumococcal conjugate vaccine 13-valent IM  . UA/M w/rflx Culture, Routine  . CBC with Differential/Platelet  . Comprehensive metabolic panel  . T4, free  . TSH  . Lipid panel  . Vitamin D 1,25 dihydroxy    Meds ordered this encounter  Medications  . escitalopram (LEXAPRO) 10 MG tablet    Sig: Take 1 tablet (10 mg total) by mouth daily.    Dispense:  90 tablet    Refill:  3    Order Specific Question:   Supervising Provider    Answer:   [name] [[id number]]  . ALPRAZolam (XANAX) 0.5 MG tablet    Sig: Take 1/2 to 1 tablet po BID prn anxiety    Dispense:  135 tablet    Refill:  1    This is for 90 day prescription.    Order Specific Question:   Supervising Provider    Answer:   [name] [[id number]]    Time spent: [place], MD  Internal Medicine

## 2018-05-17 LAB — UA/M W/RFLX CULTURE, ROUTINE
Bilirubin, UA: NEGATIVE
Glucose, UA: NEGATIVE
Ketones, UA: NEGATIVE
LEUKOCYTES UA: NEGATIVE
Nitrite, UA: NEGATIVE
PH UA: 5.5 (ref 5.0–7.5)
Protein, UA: NEGATIVE
RBC, UA: NEGATIVE
SPEC GRAV UA: 1.022 (ref 1.005–1.030)
Urobilinogen, Ur: 0.2 mg/dL (ref 0.2–1.0)

## 2018-05-17 LAB — MICROSCOPIC EXAMINATION: Casts: NONE SEEN /lpf

## 2018-05-19 ENCOUNTER — Other Ambulatory Visit
Admission: RE | Admit: 2018-05-19 | Discharge: 2018-05-19 | Disposition: A | Discharge status: Home / Self Care | Payer: Medicare Other | Source: Ambulatory Visit | Attending: Nurse Practitioner | Admitting: Nurse Practitioner

## 2018-05-19 DIAGNOSIS — Z0001 Encounter for general adult medical examination with abnormal findings: Secondary | ICD-10-CM | POA: Diagnosis not present

## 2018-05-19 DIAGNOSIS — E2839 Other primary ovarian failure: Secondary | ICD-10-CM | POA: Insufficient documentation

## 2018-05-19 DIAGNOSIS — I482 Chronic atrial fibrillation, unspecified: Secondary | ICD-10-CM | POA: Diagnosis not present

## 2018-05-19 DIAGNOSIS — E782 Mixed hyperlipidemia: Secondary | ICD-10-CM | POA: Diagnosis not present

## 2018-05-19 LAB — COMPREHENSIVE METABOLIC PANEL
ALBUMIN: 3.9 g/dL (ref 3.5–5.0)
ALK PHOS: 44 U/L (ref 38–126)
ALT: 17 U/L (ref 0–44)
ANION GAP: 7 (ref 5–15)
AST: 21 U/L (ref 15–41)
BUN: 19 mg/dL (ref 8–23)
CO2: 30 mmol/L (ref 22–32)
Calcium: 8.9 mg/dL (ref 8.9–10.3)
Chloride: 105 mmol/L (ref 98–111)
Creatinine, Ser: 0.72 mg/dL (ref 0.44–1.00)
GFR calc Af Amer: >60 mL/min (ref >60)
GFR calc non Af Amer: >60 mL/min (ref >60)
Glucose, Bld: 90 mg/dL (ref 70–99)
POTASSIUM: 4.2 mmol/L (ref 3.5–5.1)
SODIUM: 142 mmol/L (ref 135–145)
Total Bilirubin: 0.6 mg/dL (ref 0.3–1.2)
Total Protein: 6.8 g/dL (ref 6.5–8.1)

## 2018-05-19 LAB — CBC
HCT: 38.4 % (ref 36.0–46.0)
HEMOGLOBIN: 12.4 g/dL (ref 12.0–15.0)
MCH: 30.2 pg (ref 26.0–34.0)
MCHC: 32.3 g/dL (ref 30.0–36.0)
MCV: 93.4 fL (ref 80.0–100.0)
NRBC: 0 % (ref 0.0–0.2)
Platelets: 183 10*3/uL (ref 150–400)
RBC: 4.11 MIL/uL (ref 3.87–5.11)
RDW: 13.2 % (ref 11.5–15.5)
WBC: 6.9 10*3/uL (ref 4.0–10.5)

## 2018-05-19 LAB — LIPID PANEL
Cholesterol: 161 mg/dL (ref 0–200)
HDL: 65 mg/dL
LDL Cholesterol: 84 mg/dL (ref 0–99)
Total CHOL/HDL Ratio: 2.5 ratio
Triglycerides: 61 mg/dL
VLDL: 12 mg/dL (ref 0–40)

## 2018-05-19 LAB — TSH: TSH: 3.85 u[IU]/mL (ref 0.350–4.500)

## 2018-05-19 LAB — T4, FREE: Free T4: 0.89 ng/dL (ref 0.82–1.77)

## 2018-05-20 LAB — VITAMIN D 25 HYDROXY (VIT D DEFICIENCY, FRACTURES): VIT D 25 HYDROXY: 36 ng/mL (ref 30.0–100.0)

## 2018-05-21 ENCOUNTER — Telehealth: Payer: Self-pay

## 2018-05-21 NOTE — Telephone Encounter (Signed)
Pt advised labs came back normal  

## 2018-06-04 ENCOUNTER — Ambulatory Visit (INDEPENDENT_AMBULATORY_CARE_PROVIDER_SITE_OTHER): Payer: Medicare Other | Admitting: Internal Medicine

## 2018-06-04 ENCOUNTER — Encounter: Payer: Self-pay | Admitting: Internal Medicine

## 2018-06-04 VITALS — BP 98/60 | HR 74 | Ht 66.0 in | Wt 135.0 lb

## 2018-06-04 DIAGNOSIS — K52831 Collagenous colitis: Secondary | ICD-10-CM

## 2018-06-04 DIAGNOSIS — R142 Eructation: Secondary | ICD-10-CM | POA: Diagnosis not present

## 2018-06-04 DIAGNOSIS — R143 Flatulence: Secondary | ICD-10-CM

## 2018-06-04 DIAGNOSIS — R141 Gas pain: Secondary | ICD-10-CM | POA: Diagnosis not present

## 2018-06-04 DIAGNOSIS — F4323 Adjustment disorder with mixed anxiety and depressed mood: Secondary | ICD-10-CM | POA: Diagnosis not present

## 2018-06-04 DIAGNOSIS — K580 Irritable bowel syndrome with diarrhea: Secondary | ICD-10-CM

## 2018-06-04 MED ORDER — DIPHENOXYLATE-ATROPINE 2.5-0.025 MG PO TABS: 1.0000 | ORAL_TABLET | Freq: Four times a day (QID) | ORAL | 0 refills | Status: DC | PRN

## 2018-06-04 NOTE — Patient Instructions (Signed)
  We have sent the following medications to your pharmacy for you to pick up at your convenience: Lomotil   I'll call you when the SIBO kits come in, below is the information for this test.   You have been given a testing kit to check for small intestine bacterial overgrowth (SIBO) which is completed by a company named [employer]. Make sure to return your test in the mail using the return mailing label given you along with the kit. Your demographic and insurance information have already been sent to the company and they should be in contact with you over the next week regarding this test. Please keep in mind that you will be getting a call from phone number [phone] or a similar number. If you do not hear from them within this time frame, please call our office at [phone].    I appreciate the opportunity to care for you. [name], MD, [facility]

## 2018-06-04 NOTE — Progress Notes (Signed)
[name] 72 y.o. [birth year] [id number]  Assessment & Plan:  IRRITABLE BOWEL SYNDROME Refill Lomotil SIBO test Hard to tell how much of her problems are IBS diarrhea predominant versus her collagenous colitis versus both  Collagenous colitis ? retx collagenous colitis but is in the donut hole   I appreciate the opportunity to care for this patient.  CC: Heather Guise, MD  Subjective:   Chief Complaint: Diarrhea bloating and reflux  HPI [name] is here for follow-up, she was in and saw [name] in September, the notes say right upper quadrant pain but she says she had left upper quadrant pain.  A right upper quadrant ultrasound was ordered and was negative.  She has a long history of IBS problems diarrhea predominant also has a history of collagenous colitis.  There is also a history of C. difficile.  [name] had recommended VS L #3 but it was too expensive so she told her to take a probiotic of her choice.  [name] is currently in the donut hole so medicines are very expensive.  She uses Lomotil intermittently but sparingly and says that helps when she needs to control things.  Last colonoscopy in 2015 demonstrated persistent collagenous colitis.  She was treated with steroids at that time.  EGD in 2017 for dysphagia and abdominal pain was negative and empiric Maloney dilation was done. Allergies  Allergen Reactions  . Latex   . Adhesive [Tape] Rash  . Aspirin Other (See Comments)    Aggravates Acid Reflux  . Prednisone Palpitations    Made pts heart race   Current Meds  Medication Sig  . ALPRAZolam (XANAX) 0.5 MG tablet Take 1/2 to 1 tablet po BID prn anxiety  . Coenzyme Q10 (COQ10 PO) Take 1 tablet by mouth daily.  Marland Kitchen diltiazem (CARDIZEM) 30 MG tablet Take 1 tablet (30 mg total) by mouth 3 (three) times daily as needed (for breakthrough atrial fibrillation).  . diphenoxylate-atropine (LOMOTIL) 2.5-0.025 MG tablet Take 1 tablet by mouth 4 (four) times daily as needed for  diarrhea or loose stools.  Marland Kitchen escitalopram (LEXAPRO) 10 MG tablet Take 1 tablet (10 mg total) by mouth daily.  . famotidine (PEPCID) 40 MG tablet Take 1 tablet (40 mg total) by mouth 2 (two) times daily before a meal. Breakfast and supper  . flecainide (TAMBOCOR) 50 MG tablet Take 1.5 tablets (75 mg total) by mouth 2 (two) times daily.  . metoprolol succinate (TOPROL-XL) 25 MG 24 hr tablet Take 1 tablet (25 mg total) by mouth daily.  . Probiotic Product (VSL#3 DS PO) Take by mouth as needed.   . rivaroxaban (XARELTO) 20 MG TABS tablet Take 1 tablet (20 mg total) by mouth daily with supper.  . rosuvastatin (CRESTOR) 5 MG tablet TAKE 1 TABLET BY MOUTH ONCE DAILY (Patient taking differently: Take 5 mg by mouth every other day. )   Past Medical History:  Diagnosis Date  . Arthritis    "fingers; right shoulder"  . Broken ribs   . C. difficile diarrhea   . Cataract   . Collagenous colitis   . Coronary Calcium    a. 11/2014 Cardiac CT: Ca2+ score = 50 - all in prox LAD. 69th %'ile for age/sex; b. 05/2015 Ex MV: EF 73%. No infarct/ischemia-->Low risk.  . Diverticulosis   . Esophageal candidiasis ([place])   . GERD (gastroesophageal reflux disease)   . Heart murmur   . Hiatal hernia 2004  . Hyperlipidemia   . IBS (irritable bowel syndrome)   .  Internal hemorrhoids 2010   Colon-Dr. Vira Agar   . Kidney stones   . Midsternal chest pain    a. 01/2011 Ex Mv: EF 68%, No ischemia.  . Migraine 01/16/12   "none now for several years"  . Mild Mitral regurgitation    a. 12/2011 Echo: EF 55-60%, no rwma, No evidence of MVP, Mild MR  . Osteoporosis   . PAF (paroxysmal atrial fibrillation) ([facility])    a. diagnosed 12/2011-->s/p DCCV-->flecainide/Xarelto (CHA2DS2VASc = 2).  . Pancreas divisum of native pancreas    probable by CT  . Pterygium eye, bilateral   . Schatzki's ring 2010    EGD- Dr. Vira Agar   . Squamous cell carcinoma of skin    multiple  . Ulcerative colitis [facility])    Past Surgical History:    Procedure Laterality Date  . APPENDECTOMY  1963  . BREAST CYST ASPIRATION Bilateral    "several; both sides"  . CARDIOVERSION  01/16/2012   Procedure: CARDIOVERSION;  Surgeon: Evans Lance, MD;  Location: [facility];  Service: Cardiovascular;  Laterality: N/A;  . COLONOSCOPY  Multiple  . CYSTOSCOPY W/ STONE MANIPULATION  1980's  . EYE SURGERY     x4  . LEG SURGERY     skin cancer removal  . OVARIAN CYST REMOVAL  1990's  . SHOULDER ARTHROSCOPY W/ ROTATOR CUFF REPAIR Right ~ 2005  . SQUAMOUS CELL CARCINOMA EXCISION     "10-12 removed; all over my body - nose, predominately legs"  . TONSILLECTOMY AND ADENOIDECTOMY  1975  . UPPER GASTROINTESTINAL ENDOSCOPY  Multiple   w/dilation, hiatal hernia  . VAGINAL HYSTERECTOMY  1972   Social History   Social History Narrative   Lives in [place] with husband.   She is retired   One daughter, 2 grandsons   Spends a fair amount of leisure time at [employer]   family history includes Alzheimer's disease in her mother; Diabetes in her father; Heart disease in her father; Irritable bowel syndrome in her father; Kidney disease in her father.   Review of Systems As per HPI  Objective:   Physical Exam BP 98/60   Pulse 74   Ht 5\' 6"  (1.676 m)   Wt 135 lb (61.2 kg)   BMI 21.79 kg/m   No acute distress

## 2018-06-04 NOTE — Assessment & Plan Note (Addendum)
Refill Lomotil SIBO test Hard to tell how much of her problems are IBS diarrhea predominant versus her collagenous colitis versus both

## 2018-06-05 ENCOUNTER — Encounter: Payer: Self-pay | Admitting: Nurse Practitioner

## 2018-06-05 ENCOUNTER — Ambulatory Visit (INDEPENDENT_AMBULATORY_CARE_PROVIDER_SITE_OTHER): Payer: Medicare Other | Admitting: Nurse Practitioner

## 2018-06-05 VITALS — BP 100/56 | HR 63 | Ht 66.5 in | Wt 136.8 lb

## 2018-06-05 DIAGNOSIS — I480 Paroxysmal atrial fibrillation: Secondary | ICD-10-CM | POA: Diagnosis not present

## 2018-06-05 DIAGNOSIS — E782 Mixed hyperlipidemia: Secondary | ICD-10-CM

## 2018-06-05 NOTE — Progress Notes (Signed)
Office Visit    Patient Name: [name] Date of Encounter: 06/05/2018  Primary Care Provider:  [name], MD Primary Cardiologist:  Ida Rogue, MD  Chief Complaint    72 year old female with a history of paroxysmal atrial fibrillation status post cardioversion in 2013, tobacco abuse, coronary calcium noted on CT, hyperlipidemia, hiatal hernia, mild mitral regurgitation, and GERD, who presents for follow-up related to paroxysmal atrial fibrillation.  Past Medical History    Past Medical History:  Diagnosis Date  . Arthritis    "fingers; right shoulder"  . Broken ribs   . C. difficile diarrhea   . Cataract   . Collagenous colitis   . Coronary Calcium    a. 11/2014 Cardiac CT: Ca2+ score = 50 - all in prox LAD. 69th %'ile for age/sex; b. 05/2015 Ex MV: EF 73%. No infarct/ischemia-->Low risk.  . Diverticulosis   . Esophageal candidiasis ([place])   . GERD (gastroesophageal reflux disease)   . Heart murmur   . Hiatal hernia 2004  . Hyperlipidemia   . IBS (irritable bowel syndrome)   . Internal hemorrhoids 2010   Colon-Dr. Vira Agar   . Kidney stones   . Midsternal chest pain    a. 01/2011 Ex Mv: EF 68%, No ischemia.  . Migraine 01/16/12   "none now for several years"  . Mild Mitral regurgitation    a. 12/2011 Echo: EF 55-60%, no rwma, No evidence of MVP, Mild MR  . Osteoporosis   . PAF (paroxysmal atrial fibrillation) (HCC)    a. diagnosed 12/2011-->s/p DCCV-->flecainide/Xarelto (CHA2DS2VASc = 2).  . Pancreas divisum of native pancreas    probable by CT  . Pterygium eye, bilateral   . Schatzki's ring 2010    EGD- Dr. Vira Agar   . Squamous cell carcinoma of skin    multiple  . Ulcerative colitis [facility])    Past Surgical History:  Procedure Laterality Date  . APPENDECTOMY  1963  . BREAST CYST ASPIRATION Bilateral    "several; both sides"  . CARDIOVERSION  01/16/2012   Procedure: CARDIOVERSION;  Surgeon: Evans Lance, MD;  Location: [facility];  Service:  Cardiovascular;  Laterality: N/A;  . COLONOSCOPY  Multiple  . CYSTOSCOPY W/ STONE MANIPULATION  1980's  . EYE SURGERY     x4  . LEG SURGERY     skin cancer removal  . OVARIAN CYST REMOVAL  1990's  . SHOULDER ARTHROSCOPY W/ ROTATOR CUFF REPAIR Right ~ 2005  . SQUAMOUS CELL CARCINOMA EXCISION     "10-12 removed; all over my body - nose, predominately legs"  . TONSILLECTOMY AND ADENOIDECTOMY  1975  . UPPER GASTROINTESTINAL ENDOSCOPY  Multiple   w/dilation, hiatal hernia  . VAGINAL HYSTERECTOMY  1972    Allergies  Allergies  Allergen Reactions  . Latex   . Adhesive [Tape] Rash  . Aspirin Other (See Comments)    Aggravates Acid Reflux  . Prednisone Palpitations    Made pts heart race    History of Present Illness    72 year old female with a history of palpitations/tachycardia/and paroxysmal atrial fibrillation status post cardioversion in 2013.  Other history includes mild mitral regurgitation, hyperlipidemia, GERD, and chest pain with CT cardiac calcium score of 50, placing her in the 69th percentile.  This was performed in May 2016 and was followed by stress testing in November 2016, which was nonischemic.  Atrial fibrillation has been managed with flecainide and Xarelto therapy in the setting of a CHA2DS2VASc of 2.  She was  last seen in clinic in January 2019, at which time she reported rare episodes of paroxysmal atrial fibrillation, which were generally short-lived.  Periodically, she would take an extra flecainide.  Because of intermittent persistent, her flecainide dose was increased to 75 mg twice daily at that time.  Since her last visit, she reports that she has remained very active and overall doing well.  She thinks that she has a brief paroxysm of atrial fibrillation about once a week, occurring either at rest or with activity, and sometimes at night.  Symptoms typically just last a few seconds and resolve spontaneously.  Every once a while palpitations might last a few  minutes and she would consider taking an extra 50 mg of flecainide if this were to occur however, she has not required any additional doses of flecainide over the past 10 months.  Overall, she is pleased with her low burden of A. fib and does not think that it significantly impacts or limits her lifestyle.  She denies chest pain, dyspnea, PND, orthopnea, dizziness, syncope, edema, or early satiety.  Home Medications    Prior to Admission medications   Medication Sig Start Date End Date Taking? Authorizing Provider  ALPRAZolam Duanne Moron) 0.5 MG tablet Take 1/2 to 1 tablet po BID prn anxiety 05/16/18   Ronnell Freshwater, NP  Coenzyme Q10 (COQ10 PO) Take 1 tablet by mouth daily.    [provider]  diltiazem (CARDIZEM) 30 MG tablet Take 1 tablet (30 mg total) by mouth 3 (three) times daily as needed (for breakthrough atrial fibrillation). Patient not taking: Reported on 03/25/2018 10/14/13   Minna Merritts, MD  diphenoxylate-atropine (LOMOTIL) 2.5-0.025 MG tablet Take 1 tablet by mouth 4 (four) times daily as needed for diarrhea or loose stools. 06/04/18   Gatha Mayer, MD  escitalopram (LEXAPRO) 10 MG tablet Take 1 tablet (10 mg total) by mouth daily. 05/16/18   Ronnell Freshwater, NP  famotidine (PEPCID) 40 MG tablet Take 1 tablet (40 mg total) by mouth 2 (two) times daily before a meal. Breakfast and supper 02/13/16 02/04/20  Gatha Mayer, MD  flecainide (TAMBOCOR) 50 MG tablet Take 1.5 tablets (75 mg total) by mouth 2 (two) times daily. 08/14/17   Minna Merritts, MD  metoprolol succinate (TOPROL-XL) 25 MG 24 hr tablet Take 1 tablet (25 mg total) by mouth daily. 05/05/18   Minna Merritts, MD  nitroGLYCERIN (NITROSTAT) 0.4 MG SL tablet Place 1 tablet (0.4 mg total) under the tongue every 5 (five) minutes as needed for chest pain. 12/10/16 08/14/17  Minna Merritts, MD  Probiotic Product (VSL#3 DS PO) Take by mouth as needed.     [provider]  propranolol (INDERAL) 20 MG  tablet Take 1 tablet (20 mg total) by mouth 3 (three) times daily as needed (for breakthrough atrial fibrillation). Patient taking differently: Take 40 mg by mouth as needed (for breakthrough atrial fibrillation).  10/14/13   Minna Merritts, MD  rivaroxaban (XARELTO) 20 MG TABS tablet Take 1 tablet (20 mg total) by mouth daily with supper. 08/14/17   Minna Merritts, MD  rosuvastatin (CRESTOR) 5 MG tablet TAKE 1 TABLET BY MOUTH ONCE DAILY Patient taking differently: TAKE 1 TABLET BY MOUTH EVERY OTHER DAY. 07/15/17   Minna Merritts, MD    Review of Systems    As above, occasional tachypalpitations in the setting of paroxysmal A. fib but overall these are brief and infrequent.  She denies chest pain, dyspnea, pnd,  orthopnea, n, v, dizziness, syncope, edema, weight gain, or early satiety.  She has reduced her Crestor dose to 5 mg every other day in the setting of leg aching with improvement in symptoms.  All other systems reviewed and are otherwise negative except as noted above.  Physical Exam    VS:  BP (!) 100/56 (BP Location: Left Arm, Patient Position: Sitting, Cuff Size: Normal)   Pulse 63   Ht 5' 6.5" (1.689 m)   Wt 136 lb 12 oz (62 kg)   BMI 21.74 kg/m  , BMI Body mass index is 21.74 kg/m. GEN: Well nourished, well developed, in no acute distress. HEENT: normal. Neck: Supple, no JVD, carotid bruits, or masses. Cardiac: RRR, no murmurs, rubs, or gallops. No clubbing, cyanosis, edema.  Radials/DP/PT 2+ and equal bilaterally.  Respiratory:  Respirations regular and unlabored, clear to auscultation bilaterally. GI: Soft, nontender, nondistended, BS + x 4. MS: no deformity or atrophy. Skin: warm and dry, no rash. Neuro:  Strength and sensation are intact. Psych: Normal affect.  Accessory Clinical Findings    ECG personally reviewed by me today - RSR, PAC, nonspecific ST changes - no acute changes.  Lab Results  Component Value Date   WBC 6.9 05/19/2018   HGB 12.4  05/19/2018   HCT 38.4 05/19/2018   MCV 93.4 05/19/2018   PLT 183 05/19/2018   Lab Results  Component Value Date   CREATININE 0.72 05/19/2018   BUN 19 05/19/2018   NA 142 05/19/2018   K 4.2 05/19/2018   CL 105 05/19/2018   CO2 30 05/19/2018   Lab Results  Component Value Date   ALT 17 05/19/2018   AST 21 05/19/2018   ALKPHOS 44 05/19/2018   BILITOT 0.6 05/19/2018   Lab Results  Component Value Date   CHOL 161 05/19/2018   HDL 65 05/19/2018   LDLCALC 84 05/19/2018   TRIG 61 05/19/2018   CHOLHDL 2.5 05/19/2018    Assessment & Plan    1.  Paroxysmal atrial fibrillation: Overall, patient has been doing well since her last visit in January.  She has occasional, brief paroxysms maybe about once a week, lasting less than a minute and resolving spontaneously.  She leads an active lifestyle does not feel as though her symptoms limit her in any way.  She is quite pleased overall.  She remains on flecainide 75 mg twice daily and does think that the increased dose has reduced A. fib burden overall.  She also remains on beta-blocker and Xarelto therapy.  Recent labs in late October showed normal CBC, be met, LFTs, and lipids.  2.  Hyperlipidemia: She has had some myalgias on daily Crestor and over the past 8 to 9 months, she has been taking it every other day.  Recent lipids in October showed a total cholesterol 161 with an LDL of 84.  LFTs are within normal limits.  Continue current Crestor dosing.  3.  Disposition: Follow-up in 9 to 12 months. Murray Hodgkins, NP 06/05/2018, 9:24 AM

## 2018-06-05 NOTE — Patient Instructions (Addendum)
Medication Instructions:  - Your physician recommends that you continue on your current medications as directed. Please refer to the Current Medication list given to you today.  Samples given today: Xarelto 20 mg Lot: [id number] Exp: 3/21 # 3 bottles  If you need a refill on your cardiac medications before your next appointment, please call your pharmacy.   Lab work: - none ordered  If you have labs (blood work) drawn today and your tests are completely normal, you will receive your results only by: Marland Kitchen MyChart Message (if you have MyChart) OR . A paper copy in the mail If you have any lab test that is abnormal or we need to change your treatment, we will call you to review the results.  Testing/Procedures: - none ordered  Follow-Up: At [facility], you and your health needs are our priority.  As part of our continuing mission to provide you with exceptional heart care, we have created designated Provider Care Teams.  These Care Teams include your primary Cardiologist (physician) and Advanced Practice Providers (APPs -  Physician Assistants and Nurse Practitioners) who all work together to provide you with the care you need, when you need it. You will need a follow up appointment in 9 months.  Please call our office 2 months in advance to schedule this appointment.  You may see Ida Rogue, MD or one of the following Advanced Practice Providers on your designated Care Team:   Murray Hodgkins, NP Christell Faith, PA-C . Marrianne Mood, PA-C  Any Other Special Instructions Will Be Listed Below (If Applicable). - N/A

## 2018-06-08 ENCOUNTER — Encounter: Payer: Self-pay | Admitting: Internal Medicine

## 2018-06-08 NOTE — Assessment & Plan Note (Signed)
?   retx collagenous colitis but is in the donut hole

## 2018-06-13 ENCOUNTER — Encounter: Payer: Self-pay | Admitting: Internal Medicine

## 2018-06-13 DIAGNOSIS — R143 Flatulence: Secondary | ICD-10-CM | POA: Diagnosis not present

## 2018-06-13 DIAGNOSIS — K580 Irritable bowel syndrome with diarrhea: Secondary | ICD-10-CM | POA: Diagnosis not present

## 2018-06-25 ENCOUNTER — Encounter: Payer: Self-pay | Admitting: Internal Medicine

## 2018-06-25 DIAGNOSIS — K638219 Small intestinal bacterial overgrowth, unspecified: Secondary | ICD-10-CM

## 2018-06-25 DIAGNOSIS — K6389 Other specified diseases of intestine: Secondary | ICD-10-CM

## 2018-06-25 HISTORY — DX: Other specified diseases of intestine: K63.89

## 2018-06-25 HISTORY — DX: Small intestinal bacterial overgrowth, unspecified: K63.8219

## 2018-06-26 ENCOUNTER — Telehealth: Payer: Self-pay

## 2018-06-26 MED ORDER — AMOXICILLIN-POT CLAVULANATE 875-125 MG PO TABS: 1.0000 | ORAL_TABLET | Freq: Two times a day (BID) | ORAL | 0 refills | Status: DC

## 2018-06-26 NOTE — Telephone Encounter (Signed)
Patient notified rx sent 

## 2018-06-26 NOTE — Telephone Encounter (Signed)
-----   Message from Gatha Mayer, MD sent at 06/25/2018  6:23 PM EST ----- Regarding: SIBO + Tell her SIBO test was +  Augmentin 875 mg bid x 10 days and she should let us know a few weeks after completing if better

## 2018-07-09 ENCOUNTER — Ambulatory Visit
Admission: RE | Admit: 2018-07-09 | Discharge: 2018-07-09 | Disposition: A | Discharge status: Home / Self Care | Payer: Medicare Other | Source: Ambulatory Visit | Attending: Nurse Practitioner | Admitting: Nurse Practitioner

## 2018-07-09 DIAGNOSIS — D692 Other nonthrombocytopenic purpura: Secondary | ICD-10-CM | POA: Diagnosis not present

## 2018-07-09 DIAGNOSIS — C44729 Squamous cell carcinoma of skin of left lower limb, including hip: Secondary | ICD-10-CM | POA: Diagnosis not present

## 2018-07-09 DIAGNOSIS — Z1231 Encounter for screening mammogram for malignant neoplasm of breast: Secondary | ICD-10-CM | POA: Insufficient documentation

## 2018-07-09 DIAGNOSIS — L821 Other seborrheic keratosis: Secondary | ICD-10-CM | POA: Diagnosis not present

## 2018-07-09 DIAGNOSIS — E2839 Other primary ovarian failure: Secondary | ICD-10-CM | POA: Diagnosis not present

## 2018-07-09 DIAGNOSIS — L820 Inflamed seborrheic keratosis: Secondary | ICD-10-CM | POA: Diagnosis not present

## 2018-07-09 DIAGNOSIS — M810 Age-related osteoporosis without current pathological fracture: Secondary | ICD-10-CM | POA: Diagnosis not present

## 2018-07-09 DIAGNOSIS — L578 Other skin changes due to chronic exposure to nonionizing radiation: Secondary | ICD-10-CM | POA: Diagnosis not present

## 2018-07-09 DIAGNOSIS — D485 Neoplasm of uncertain behavior of skin: Secondary | ICD-10-CM | POA: Diagnosis not present

## 2018-07-14 ENCOUNTER — Other Ambulatory Visit: Payer: Self-pay | Admitting: Nurse Practitioner

## 2018-07-14 DIAGNOSIS — M810 Age-related osteoporosis without current pathological fracture: Secondary | ICD-10-CM

## 2018-07-14 MED ORDER — ALENDRONATE SODIUM 35 MG PO TABS: 35.0000 mg | ORAL_TABLET | ORAL | 5 refills | Status: DC

## 2018-07-14 NOTE — Progress Notes (Signed)
Bone density report showing bone los in the spine but some evidence of osteoporosis in the pelvis. I would like for her to start low dose fosamax 35mg  weekly to help build up bone strength. She should also participate in low impact physical activity regularly. Recheck bone density in two years.

## 2018-07-17 ENCOUNTER — Telehealth: Payer: Self-pay

## 2018-07-17 NOTE — Telephone Encounter (Signed)
Informed pt that [name] wants her to see rheumatologist and [name] will schedule the appointment for her.

## 2018-07-17 NOTE — Progress Notes (Signed)
Can we go ahead and refer her to rheumatology for treatment of osteoporosis with IV reclast. She is unable to tolerate the oral medications for this. Thanks.

## 2018-07-17 NOTE — Telephone Encounter (Signed)
-----   Message from Ronnell Freshwater, NP sent at 07/17/2018 12:35 PM EST ----- Can we go ahead and refer her to rheumatology for treatment of osteoporosis with IV reclast. She is unable to tolerate the oral medications for this. Thanks.

## 2018-07-21 ENCOUNTER — Telehealth: Payer: Self-pay | Admitting: Internal Medicine

## 2018-07-21 MED ORDER — DIPHENOXYLATE-ATROPINE 2.5-0.025 MG PO TABS: 1.0000 | ORAL_TABLET | Freq: Four times a day (QID) | ORAL | 1 refills | Status: DC | PRN

## 2018-07-21 NOTE — Telephone Encounter (Signed)
Refill lomotil x 2  Let's see how she does over next 1-2 months  May yet see effects of the Augmentin

## 2018-07-21 NOTE — Telephone Encounter (Signed)
Pt called to let you know that she has finished her Augmentin. Pt was told to CB when completed.

## 2018-07-21 NOTE — Telephone Encounter (Signed)
[name] finished her Augmentin last week . She said her diet has been different with all the holiday festivities. She went from diarrhea to constipation and now the past two days diarrhea, no blood, no fever. She said she finished the cake last night. The lomotil she had worked great. She would like a refill on this Sir. I told her I will call her back with a game plan.

## 2018-07-21 NOTE — Telephone Encounter (Signed)
Left patient detailed message with the game plan and refilled her Lomotil as approved. Lomotil was faxed into the pharmacy.

## 2018-07-26 ENCOUNTER — Other Ambulatory Visit: Payer: Self-pay | Admitting: Cardiovascular Disease

## 2018-08-08 ENCOUNTER — Telehealth: Payer: Self-pay

## 2018-08-08 ENCOUNTER — Other Ambulatory Visit: Payer: Self-pay | Admitting: Nurse Practitioner

## 2018-08-08 DIAGNOSIS — M810 Age-related osteoporosis without current pathological fracture: Secondary | ICD-10-CM

## 2018-08-08 MED ORDER — IBANDRONATE SODIUM 150 MG PO TABS: 150.0000 mg | ORAL_TABLET | ORAL | 5 refills | Status: DC

## 2018-08-08 NOTE — Progress Notes (Signed)
Changed fosamax to boniva 150mg  once weekly. Sent new prescription to her pharmacy.

## 2018-08-08 NOTE — Telephone Encounter (Signed)
Pt advised we send boniva try that once a month

## 2018-08-08 NOTE — Telephone Encounter (Signed)
Changed fosamax to boniva 150mg  once weekly. Sent new prescription to her pharmacy.

## 2018-08-25 ENCOUNTER — Other Ambulatory Visit: Payer: Self-pay | Admitting: Cardiovascular Disease

## 2018-08-25 NOTE — Telephone Encounter (Signed)
Please review for refill, Thanks !  

## 2018-09-02 DIAGNOSIS — F4323 Adjustment disorder with mixed anxiety and depressed mood: Secondary | ICD-10-CM | POA: Diagnosis not present

## 2018-09-09 ENCOUNTER — Other Ambulatory Visit: Payer: Self-pay | Admitting: Cardiovascular Disease

## 2018-10-03 ENCOUNTER — Telehealth: Payer: Self-pay | Admitting: Interventional Cardiology

## 2018-10-03 NOTE — Telephone Encounter (Signed)
    Patient called in because she accidentally took an extra dose of her flecainide, metoprolol, and Xarelto.  She feels well.  She does not feel that she is in AFib.  Her pulse is regular.  She is at a [place].    She does not see any bleeding issues.  No lightheadedness or palpitations.    I advised her to watch for any bleeding and seek attention if this happens.  I don't think she will have a problem.  She should skip her Xarelto on 3/14 and she will resume it on the 3/15.  She will let us know if she has any problems.    Heather Booze, MD

## 2018-10-15 ENCOUNTER — Other Ambulatory Visit: Payer: Self-pay | Admitting: Cardiovascular Disease

## 2018-11-17 ENCOUNTER — Ambulatory Visit: Payer: Self-pay | Admitting: Nurse Practitioner

## 2018-11-20 ENCOUNTER — Other Ambulatory Visit: Payer: Self-pay | Admitting: Cardiovascular Disease

## 2018-11-20 NOTE — Telephone Encounter (Signed)
Refill Request.  

## 2018-11-20 NOTE — Telephone Encounter (Signed)
Xarelto 20mg  refill requested. Pt is 73yo female. Crea .72 (05/19/18), Weight 62 kg,  Last seen by Ignacia Bayley (06/05/18), CrCl,  68.34ml/min. Spoke with [facility], pt had a 6 month supply sent in February and they stated the were putting this refill on hold. Pt will get in 3 months.

## 2018-11-21 ENCOUNTER — Encounter: Payer: Self-pay | Admitting: Nurse Practitioner

## 2018-11-21 ENCOUNTER — Ambulatory Visit (INDEPENDENT_AMBULATORY_CARE_PROVIDER_SITE_OTHER): Payer: PPO | Admitting: Nurse Practitioner

## 2018-11-21 ENCOUNTER — Other Ambulatory Visit: Payer: Self-pay

## 2018-11-21 VITALS — BP 142/64 | HR 66 | Resp 16 | Ht 68.0 in | Wt 136.0 lb

## 2018-11-21 DIAGNOSIS — I482 Chronic atrial fibrillation, unspecified: Secondary | ICD-10-CM

## 2018-11-21 DIAGNOSIS — F419 Anxiety disorder, unspecified: Secondary | ICD-10-CM

## 2018-11-21 MED ORDER — ALPRAZOLAM 0.5 MG PO TABS: ORAL_TABLET | ORAL | 1 refills | Status: DC

## 2018-11-21 NOTE — Progress Notes (Signed)
[facility] [place], [place] [address]  Internal MEDICINE  Telephone Visit  Patient Name: [name]  [id number]  [id number]  Date of Service: 11/21/2018  I connected with the patient at 10:29am by webcam and verified the patients identity using two identifiers.   I discussed the limitations, risks, security and privacy concerns of performing an evaluation and management service by webcam and the availability of in person appointments. I also discussed with the patient that there may be a patient responsible charge related to the service.  The patient expressed understanding and agrees to proceed.    Chief Complaint  Patient presents with  . Telephone Assessment  . Telephone Screen  . Hyperlipidemia  . Gastroesophageal Reflux    The patient has been contacted via webcam for follow up visit due to concerns for spread of novel coronavirus. Patient continues to do well on lexapro daily. Takes alprazolam 0.5mg  twice daily if needed for acute anxiety. She reports she needs to have a new prescription for this. She generally gets a 90 day prescription with 1 refill. She is already scheduled for wellness visit 04/2019.       Current Medication: Outpatient Encounter Medications as of 11/21/2018  Medication Sig Note  . ALPRAZolam (XANAX) 0.5 MG tablet Take 1/2 to 1 tablet po BID prn anxiety   . Coenzyme Q10 (COQ10 PO) Take 1 tablet by mouth daily.   Marland Kitchen diltiazem (CARDIZEM) 30 MG tablet Take 1 tablet (30 mg total) by mouth 3 (three) times daily as needed (for breakthrough atrial fibrillation). 03/25/2018: On hand   . diphenoxylate-atropine (LOMOTIL) 2.5-0.025 MG tablet Take 1 tablet by mouth 4 (four) times daily as needed for diarrhea or loose stools.   Marland Kitchen escitalopram (LEXAPRO) 10 MG tablet Take 1 tablet (10 mg total) by mouth daily.   . famotidine (PEPCID) 40 MG tablet Take 1 tablet (40 mg total) by mouth 2 (two) times daily before a meal. Breakfast and supper   .  flecainide (TAMBOCOR) 50 MG tablet TAKE 1 AND 1/2 TABLETS (75MG  TOTAL) BY MOUTH TWICE A DAY   . ibandronate (BONIVA) 150 MG tablet Take 1 tablet (150 mg total) by mouth every 30 (thirty) days.   . metoprolol succinate (TOPROL-XL) 25 MG 24 hr tablet Take 1 tablet (25 mg total) by mouth daily.   . Probiotic Product (VSL#3 DS PO) Take by mouth as needed.    . rosuvastatin (CRESTOR) 5 MG tablet TAKE 1 TABLET BY MOUTH ONCE DAILY   . XARELTO 20 MG TABS tablet TAKE 1 TABLET BY MOUTH ONCE DAILY WITH SUPPER   . [DISCONTINUED] ALPRAZolam (XANAX) 0.5 MG tablet Take 1/2 to 1 tablet po BID prn anxiety   . nitroGLYCERIN (NITROSTAT) 0.4 MG SL tablet Place 1 tablet (0.4 mg total) under the tongue every 5 (five) minutes as needed for chest pain.   . [DISCONTINUED] amoxicillin-clavulanate (AUGMENTIN) 875-125 MG tablet Take 1 tablet by mouth 2 (two) times daily.    No facility-administered encounter medications on file as of 11/21/2018.     Surgical History: Past Surgical History:  Procedure Laterality Date  . APPENDECTOMY  1963  . BREAST CYST ASPIRATION Bilateral    "several; both sides"  . CARDIOVERSION  01/16/2012   Procedure: CARDIOVERSION;  Surgeon: Evans Lance, MD;  Location: [facility];  Service: Cardiovascular;  Laterality: N/A;  . COLONOSCOPY  Multiple  . CYSTOSCOPY W/ STONE MANIPULATION  1980's  . EYE SURGERY     x4  .  LEG SURGERY     skin cancer removal  . OVARIAN CYST REMOVAL  1990's  . SHOULDER ARTHROSCOPY W/ ROTATOR CUFF REPAIR Right ~ 2005  . SQUAMOUS CELL CARCINOMA EXCISION     "10-12 removed; all over my body - nose, predominately legs"  . TONSILLECTOMY AND ADENOIDECTOMY  1975  . UPPER GASTROINTESTINAL ENDOSCOPY  Multiple   w/dilation, hiatal hernia  . VAGINAL HYSTERECTOMY  1972    Medical History: Past Medical History:  Diagnosis Date  . Arthritis    "fingers; right shoulder"  . Broken ribs   . C. difficile diarrhea   . Cataract   . Collagenous colitis   . Coronary Calcium     a. 11/2014 Cardiac CT: Ca2+ score = 50 - all in prox LAD. 69th %'ile for age/sex; b. 05/2015 Ex MV: EF 73%. No infarct/ischemia-->Low risk.  . Diverticulosis   . Esophageal candidiasis ([place])   . GERD (gastroesophageal reflux disease)   . Heart murmur   . Hiatal hernia 2004  . Hyperlipidemia   . IBS (irritable bowel syndrome)   . Internal hemorrhoids 2010   Colon-Dr. Vira Agar   . Kidney stones   . Midsternal chest pain    a. 01/2011 Ex Mv: EF 68%, No ischemia.  . Migraine 01/16/12   "none now for several years"  . Mild Mitral regurgitation    a. 12/2011 Echo: EF 55-60%, no rwma, No evidence of MVP, Mild MR  . Osteoporosis   . PAF (paroxysmal atrial fibrillation) (HCC)    a. diagnosed 12/2011-->s/p DCCV-->flecainide/Xarelto (CHA2DS2VASc = 2).  . Pancreas divisum of native pancreas    probable by CT  . Pterygium eye, bilateral   . Schatzki's ring 2010    EGD- Dr. Vira Agar   . Small intestinal bacterial overgrowth 06/25/2018  . Squamous cell carcinoma of skin    multiple  . Ulcerative colitis ([place])     Family History: Family History  Problem Relation Age of Onset  . Heart disease Father        A Fib and CHF - died in Mid 26  . Diabetes Father   . Kidney disease Father   . Irritable bowel syndrome Father   . Alzheimer's disease Mother        Died in [place] 25's.  . Colon cancer Neg Hx     Social History   Socioeconomic History  . Marital status: Married    Spouse name: Not on file  . Number of children: 1  . Years of education: Not on file  . Highest education level: Not on file  Occupational History  . Occupation: retired  Scientific laboratory technician  . Financial resource strain: Not on file  . Food insecurity:    Worry: Not on file    Inability: Not on file  . Transportation needs:    Medical: Not on file    Non-medical: Not on file  Tobacco Use  . Smoking status: Former Smoker    Packs/day: 0.10    Years: 40.00    Pack years: 4.00    Types: Cigarettes    Last attempt to quit:  01/16/2012    Years since quitting: 6.8  . Smokeless tobacco: Never Used  Substance and Sexual Activity  . Alcohol use: Not Currently    Alcohol/week: 0.0 standard drinks    Comment: rarely  . Drug use: No  . Sexual activity: Not on file  Lifestyle  . Physical activity:    Days per week: Not on file  Minutes per session: Not on file  . Stress: Not on file  Relationships  . Social connections:    Talks on phone: Not on file    Gets together: Not on file    Attends religious service: Not on file    Active member of club or organization: Not on file    Attends meetings of clubs or organizations: Not on file    Relationship status: Not on file  . Intimate partner violence:    Fear of current or ex partner: Not on file    Emotionally abused: Not on file    Physically abused: Not on file    Forced sexual activity: Not on file  Other Topics Concern  . Not on file  Social History Narrative   Lives in [place] with husband.   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  HENT: Negative for congestion, postnasal drip, rhinorrhea, sneezing, sore throat and voice change.   Respiratory: Negative for cough, chest tightness, shortness of breath and wheezing.   Cardiovascular: Negative for chest pain and palpitations.  Gastrointestinal: Negative for abdominal pain, constipation, diarrhea, nausea and vomiting.  Endocrine: Negative for cold intolerance, heat intolerance, polydipsia and polyuria.  Musculoskeletal: Negative for arthralgias, back pain, joint swelling and neck pain.  Skin: Negative for rash.  Allergic/Immunologic: Negative for environmental allergies.  Neurological: Negative for dizziness, tremors,  numbness and headaches.  Hematological: Negative for adenopathy. Does not bruise/bleed easily.  Psychiatric/Behavioral: Positive for dysphoric mood. Negative for behavioral problems (Depression), sleep disturbance and suicidal ideas. The patient is nervous/anxious.     Today's Vitals   11/21/18 1009  BP: (!) 142/64  Pulse: 66  Resp: 16  Weight: 136 lb (61.7 kg)  Height: 5\' 8"  (1.727 m)   Body mass index is 20.68 kg/m.  Observation/Objective:  The patient is alert and oriented. She is pleasant and answers all questions appropriately. She is in no acute distress at this time.    Assessment/Plan: 1. Chronic atrial fibrillation Stable on current medications. Continue as prescribed and regular visits with cardiology.   2. Anxiety Continue lexapro every day. Ok to continue alprazolam 0.5mg  tablets, taking 1/1 to 1 tablet twice daily when needed. New prescription sent to her pharmacy today.  - ALPRAZolam (XANAX) 0.5 MG tablet; Take 1/2 to 1 tablet po BID prn anxiety  Dispense: 135 tablet; Refill: 1  General Counseling: emrey thornley understanding of the findings of today's phone visit and agrees with plan of treatment. I have discussed any further diagnostic evaluation that may be needed or ordered today. We also reviewed her medications today. she has been encouraged to call the office with any questions or concerns that should arise related to todays visit.  Reviewed risks and possible side effects associated with taking opiates, benzodiazepines and other CNS depressants. Combination of these could cause dizziness and drowsiness. Advised patient not to drive or operate machinery when taking these medications, as patient's and other's life can be at risk and will have consequences. Patient verbalized understanding in this matter. Dependence and abuse for these drugs will be monitored closely. A Controlled substance policy and procedure is on file which  allows [facility] to  order a urine drug screen test at any visit. Patient understands and agrees with the plan  This patient was seen by Leretha Pol FNP Collaboration with Dr [name] as a part of collaborative care agreement  Meds ordered this encounter  Medications  . ALPRAZolam (XANAX) 0.5 MG tablet    Sig: Take 1/2 to 1 tablet po BID prn anxiety    Dispense:  135 tablet    Refill:  1    This is for 90 day prescription.    Order Specific Question:   Supervising Provider    Answer:   [name] [[id number]]    Time spent: 37 Minutes    Dr [name] Internal medicine

## 2018-12-02 DIAGNOSIS — L578 Other skin changes due to chronic exposure to nonionizing radiation: Secondary | ICD-10-CM | POA: Diagnosis not present

## 2018-12-02 DIAGNOSIS — D485 Neoplasm of uncertain behavior of skin: Secondary | ICD-10-CM | POA: Diagnosis not present

## 2018-12-02 DIAGNOSIS — C44729 Squamous cell carcinoma of skin of left lower limb, including hip: Secondary | ICD-10-CM | POA: Diagnosis not present

## 2018-12-02 DIAGNOSIS — L814 Other melanin hyperpigmentation: Secondary | ICD-10-CM | POA: Diagnosis not present

## 2018-12-26 ENCOUNTER — Other Ambulatory Visit: Payer: Self-pay | Admitting: Nurse Practitioner

## 2018-12-26 MED ORDER — FAMCICLOVIR 500 MG PO TABS: 500.0000 mg | ORAL_TABLET | Freq: Two times a day (BID) | ORAL | 0 refills | Status: DC

## 2019-01-02 ENCOUNTER — Other Ambulatory Visit: Payer: Self-pay | Admitting: Nurse Practitioner

## 2019-01-02 ENCOUNTER — Telehealth: Payer: Self-pay

## 2019-01-02 DIAGNOSIS — K121 Other forms of stomatitis: Secondary | ICD-10-CM

## 2019-01-02 DIAGNOSIS — K1230 Oral mucositis (ulcerative), unspecified: Secondary | ICD-10-CM

## 2019-01-02 MED ORDER — FIRST-DUKES MOUTHWASH MT SUSP: OROMUCOSAL | 1 refills | Status: DC

## 2019-01-02 NOTE — Telephone Encounter (Signed)
Prescription for duke's magic mouthwash sent to her pharmacy. Swish and swallow four times daily as needed.

## 2019-01-02 NOTE — Telephone Encounter (Signed)
Informed pt that mouthwash was sent in to pharmacy and advised her of the instructions per [facility].

## 2019-01-02 NOTE — Progress Notes (Signed)
Prescription for duke's magic mouthwash sent to her pharmacy. Swish and swallow four times daily as needed.

## 2019-01-06 DIAGNOSIS — H0012 Chalazion right lower eyelid: Secondary | ICD-10-CM | POA: Diagnosis not present

## 2019-01-20 ENCOUNTER — Other Ambulatory Visit: Payer: Self-pay

## 2019-01-20 DIAGNOSIS — M810 Age-related osteoporosis without current pathological fracture: Secondary | ICD-10-CM

## 2019-01-20 MED ORDER — IBANDRONATE SODIUM 150 MG PO TABS: 150.0000 mg | ORAL_TABLET | ORAL | 5 refills | Status: DC

## 2019-03-03 DIAGNOSIS — H16223 Keratoconjunctivitis sicca, not specified as Sjogren's, bilateral: Secondary | ICD-10-CM | POA: Diagnosis not present

## 2019-03-12 ENCOUNTER — Other Ambulatory Visit: Payer: Self-pay

## 2019-03-12 DIAGNOSIS — Z0000 Encounter for general adult medical examination without abnormal findings: Secondary | ICD-10-CM | POA: Diagnosis not present

## 2019-03-12 DIAGNOSIS — Z1159 Encounter for screening for other viral diseases: Secondary | ICD-10-CM | POA: Diagnosis not present

## 2019-03-12 DIAGNOSIS — Z7689 Persons encountering health services in other specified circumstances: Secondary | ICD-10-CM | POA: Diagnosis not present

## 2019-03-12 DIAGNOSIS — M816 Localized osteoporosis [Lequesne]: Secondary | ICD-10-CM | POA: Diagnosis not present

## 2019-03-12 DIAGNOSIS — F419 Anxiety disorder, unspecified: Secondary | ICD-10-CM | POA: Diagnosis not present

## 2019-03-12 DIAGNOSIS — K219 Gastro-esophageal reflux disease without esophagitis: Secondary | ICD-10-CM | POA: Diagnosis not present

## 2019-03-12 DIAGNOSIS — I482 Chronic atrial fibrillation, unspecified: Secondary | ICD-10-CM | POA: Diagnosis not present

## 2019-03-12 DIAGNOSIS — E782 Mixed hyperlipidemia: Secondary | ICD-10-CM | POA: Diagnosis not present

## 2019-03-12 DIAGNOSIS — R7309 Other abnormal glucose: Secondary | ICD-10-CM | POA: Diagnosis not present

## 2019-03-12 DIAGNOSIS — Z1239 Encounter for other screening for malignant neoplasm of breast: Secondary | ICD-10-CM | POA: Diagnosis not present

## 2019-03-12 MED ORDER — FAMCICLOVIR 500 MG PO TABS: 500.0000 mg | ORAL_TABLET | Freq: Two times a day (BID) | ORAL | 0 refills | Status: DC

## 2019-03-18 ENCOUNTER — Other Ambulatory Visit: Payer: Self-pay | Admitting: Physician Assistant

## 2019-03-18 DIAGNOSIS — Z1231 Encounter for screening mammogram for malignant neoplasm of breast: Secondary | ICD-10-CM

## 2019-03-25 ENCOUNTER — Other Ambulatory Visit: Payer: Self-pay | Admitting: Cardiovascular Disease

## 2019-03-25 NOTE — Telephone Encounter (Signed)
Patient calling the office for samples of medication:   1.  What medication and dosage are you requesting samples for? Xarelto 20 mg daily  2.  Are you currently out of this medication? Has a few days left but in the doughnut hole.  Patient is okay with having a voicemail left. Patient will be in town tomorrow and has been scheduled with Christell Faith 9/17

## 2019-03-25 NOTE — Telephone Encounter (Signed)
Patient notified samples are available to pick up.  Medication Samples have been provided to the patient.  Drug name: Xarelto       Strength: 20 mg       Qty: 7 tablets   LOT: [id number]   Exp.Date: 10/2020    Dolores Lory 2:45 PM 03/25/2019

## 2019-03-26 DIAGNOSIS — M810 Age-related osteoporosis without current pathological fracture: Secondary | ICD-10-CM | POA: Diagnosis not present

## 2019-04-06 NOTE — Progress Notes (Signed)
Cardiology Office Note    Date:  04/09/2019   ID:  [name], DOB 11/04/1945, MRN [id number]  PCP:  Marinda Elk, MD  Cardiologist:  Ida Rogue, MD  Electrophysiologist:  None   Chief Complaint: Follow up  History of Present Illness:   [name] is a 73 y.o. female with history of PAF status post DCCV in 2013 on flecainide and Xarelto, coronary artery calcium noted on prior CT imaging, mild mitral regurgitation, hyperlipidemia, hiatal hernia, migraine disorder, Schatzki's ring, and GERD who presents for follow-up of her PAF.  Patient underwent coronary artery calcium scoring in 11/2014 with a score of 50 with all calcium being noted in the proximal LAD which placed her in the 69th percentile for age/sex with follow-up exercise Myoview in 05/2015 showing no infarct or ischemia with an EF of 73% and was overall a low risk study.  Her A. fib has been managed with flecainide and Xarelto.  When she was noted in early 2019 she noted occasional episodes of paroxysms of A. Fib, which were generally short-lived, and she would periodically take an extra flecainide.  In this setting, her flecainide was increased to 75 mg twice daily at that time.  She was most recently seen in the office in 05/2018 and reported that she remained very active and was overall doing well.  She felt like she would have brief paroxysms of A. fib about once per week either occurring at rest or with activity.  Symptoms would last for just a few seconds and spontaneously resolved.  She did not feel like her overall A. fib burden affected her quality of life.  She was maintaining sinus rhythm on EKG.  She was continued on flecainide 75 mg twice daily, beta-blocker, and Xarelto.  It was noted she had been having some myalgias on Crestor and had been taking this every other day.  She comes in doing well.  She continues to note intermittent palpitations, particularly at night, which she attributes to possible  breakthrough A. fib.  She continues to live a very active lifestyle working in an outside of her house.  She indicates just yesterday she ambulated greater than 9000 steps while planting numerous bushes at her [place].  She remains compliant with all medications and is tolerating them without issues.  No falls, BRBPR, or melena.  No lower extremity swelling, abdominal surgeon, orthopnea, PND, early satiety.  No chest pain.  No dizziness, presyncope, or syncope.  She does wonder if she may have underlying sleep apnea and has never undergone a sleep study.  Otherwise, she does not have any issues or concerns today.   Labs: 02/2019 - Hgb 12.4, PLT 182, potassium 4.2, serum creatinine 0.8, AST/ALT normal, albumin 4.0, A1c 5.7, total cholesterol 152, triglyceride 58, HDL 71, LDL 69 04/2018 - TSH normal   Past Medical History:  Diagnosis Date  . Arthritis    "fingers; right shoulder"  . Broken ribs   . C. difficile diarrhea   . Cataract   . Collagenous colitis   . Coronary Calcium    a. 11/2014 Cardiac CT: Ca2+ score = 50 - all in prox LAD. 69th %'ile for age/sex; b. 05/2015 Ex MV: EF 73%. No infarct/ischemia-->Low risk.  . Diverticulosis   . Esophageal candidiasis ([place])   . GERD (gastroesophageal reflux disease)   . Heart murmur   . Hiatal hernia 2004  . Hyperlipidemia   . IBS (irritable bowel syndrome)   . Internal hemorrhoids 2010  Colon-Dr. Vira Agar   . Kidney stones   . Midsternal chest pain    a. 01/2011 Ex Mv: EF 68%, No ischemia.  . Migraine 01/16/12   "none now for several years"  . Mild Mitral regurgitation    a. 12/2011 Echo: EF 55-60%, no rwma, No evidence of MVP, Mild MR  . Osteoporosis   . PAF (paroxysmal atrial fibrillation) (HCC)    a. diagnosed 12/2011-->s/p DCCV-->flecainide/Xarelto (CHA2DS2VASc = 2).  . Pancreas divisum of native pancreas    probable by CT  . Pterygium eye, bilateral   . Schatzki's ring 2010    EGD- Dr. Vira Agar   . Small intestinal bacterial  overgrowth 06/25/2018  . Squamous cell carcinoma of skin    multiple  . Ulcerative colitis [facility])     Past Surgical History:  Procedure Laterality Date  . APPENDECTOMY  1963  . BREAST CYST ASPIRATION Bilateral    "several; both sides"  . CARDIOVERSION  01/16/2012   Procedure: CARDIOVERSION;  Surgeon: Evans Lance, MD;  Location: [place];  Service: Cardiovascular;  Laterality: N/A;  . COLONOSCOPY  Multiple  . CYSTOSCOPY W/ STONE MANIPULATION  1980's  . EYE SURGERY     x4  . LEG SURGERY     skin cancer removal  . OVARIAN CYST REMOVAL  1990's  . SHOULDER ARTHROSCOPY W/ ROTATOR CUFF REPAIR Right ~ 2005  . SQUAMOUS CELL CARCINOMA EXCISION     "10-12 removed; all over my body - nose, predominately legs"  . TONSILLECTOMY AND ADENOIDECTOMY  1975  . UPPER GASTROINTESTINAL ENDOSCOPY  Multiple   w/dilation, hiatal hernia  . VAGINAL HYSTERECTOMY  1972    Current Medications: Current Meds  Medication Sig  . ALPRAZolam (XANAX) 0.5 MG tablet Take 1/2 to 1 tablet po BID prn anxiety  . Coenzyme Q10 (COQ10 PO) Take 1 tablet by mouth daily.  Marland Kitchen diltiazem (CARDIZEM) 30 MG tablet Take 1 tablet (30 mg total) by mouth 3 (three) times daily as needed (for breakthrough atrial fibrillation).  . diphenoxylate-atropine (LOMOTIL) 2.5-0.025 MG tablet Take 1 tablet by mouth 4 (four) times daily as needed for diarrhea or loose stools.  Marland Kitchen escitalopram (LEXAPRO) 10 MG tablet Take 1 tablet (10 mg total) by mouth daily.  . famciclovir (FAMVIR) 500 MG tablet Take 1 tablet (500 mg total) by mouth 2 (two) times daily.  . famotidine (PEPCID) 40 MG tablet Take 1 tablet (40 mg total) by mouth 2 (two) times daily before a meal. Breakfast and supper  . flecainide (TAMBOCOR) 50 MG tablet TAKE 1 AND 1/2 TABLETS (75MG  TOTAL) BY MOUTH TWICE A DAY  . ibandronate (BONIVA) 150 MG tablet Take 1 tablet (150 mg total) by mouth every 30 (thirty) days.  . metoprolol succinate (TOPROL-XL) 25 MG 24 hr tablet Take 1 tablet (25 mg total)  by mouth daily.  . Probiotic Product (VSL#3 DS PO) Take by mouth as needed.   . rosuvastatin (CRESTOR) 5 MG tablet TAKE 1 TABLET BY MOUTH ONCE DAILY    Allergies:   Latex, Adhesive [tape], Aspirin, and Prednisone   Social History   Socioeconomic History  . Marital status: Married    Spouse name: Not on file  . Number of children: 1  . Years of education: Not on file  . Highest education level: Not on file  Occupational History  . Occupation: retired  Scientific laboratory technician  . Financial resource strain: Not on file  . Food insecurity    Worry: Not on file    Inability:  Not on file  . Transportation needs    Medical: Not on file    Non-medical: Not on file  Tobacco Use  . Smoking status: Former Smoker    Packs/day: 0.10    Years: 40.00    Pack years: 4.00    Types: Cigarettes    Quit date: 01/16/2012    Years since quitting: 7.2  . Smokeless tobacco: Never Used  Substance and Sexual Activity  . Alcohol use: Not Currently    Alcohol/week: 0.0 standard drinks    Comment: rarely  . Drug use: No  . Sexual activity: Not on file  Lifestyle  . Physical activity    Days per week: Not on file    Minutes per session: Not on file  . Stress: Not on file  Relationships  . Social Herbalist on phone: Not on file    Gets together: Not on file    Attends religious service: Not on file    Active member of club or organization: Not on file    Attends meetings of clubs or organizations: Not on file    Relationship status: Not on file  Other Topics Concern  . Not on file  Social History Narrative   Lives in [place] with husband.   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 There is no history of Colon cancer.  ROS:   Review of Systems  Constitutional: Negative for chills, diaphoresis, fever, malaise/fatigue and weight loss.  HENT: Negative for congestion.   Eyes: Negative for discharge and redness.  Respiratory: Negative for cough, hemoptysis, sputum production, shortness of breath and wheezing.   Cardiovascular: Positive for palpitations. Negative for chest pain, orthopnea, claudication, leg swelling and PND.  Gastrointestinal: Negative for abdominal pain, blood in stool, heartburn, melena, nausea and vomiting.  Genitourinary: Negative for hematuria.  Musculoskeletal: Negative for falls and myalgias.  Skin: Negative for rash.  Neurological: Negative for dizziness, tingling, tremors, sensory change, speech change, focal weakness, loss of consciousness and weakness.  Endo/Heme/Allergies: Does not bruise/bleed easily.  Psychiatric/Behavioral: Negative for substance abuse. The patient has insomnia. The patient is not nervous/anxious.   All other systems reviewed and are negative.    EKGs/Labs/Other Studies Reviewed:    Studies reviewed were summarized above. The additional studies were reviewed today:  2D echo 12/2011: - Left ventricle: The cavity size was normal. Wall thickness  was normal. Systolic function was normal. The estimated  ejection fraction was in the range of 55% to 60%. Wall  motion was normal; there were no regional wall motion  abnormalities.  - Mitral valve: Mild regurgitation.  __________  Nuclear stress test 05/2015: Exercise myocardial perfusion imaging study with no significant  ischemia Normal wall motion, EF estimated at 73% ST depressions concerning for ischemia at peak stress.  Target heart rate achieved, adequate exercise tolerance Low risk scan  Given EKG changes at peak stress, close clinical observation suggested with aggressive risk factor  modification    EKG:  EKG is ordered today.  The EKG ordered today  demonstrates sinus bradycardia, 53 bpm, nonspecific st/t changes  Recent Labs: 05/19/2018: ALT 17; BUN 19; Creatinine, Ser 0.72; Hemoglobin 12.4; Platelets 183; Potassium 4.2; Sodium 142; TSH 3.850  Recent Lipid Panel    Component Value Date/Time   CHOL 161 05/19/2018 0936   CHOL 185 12/22/2014 1008   TRIG 61 05/19/2018 0936   HDL 65 05/19/2018 0936   HDL 72 12/22/2014 1008   CHOLHDL 2.5 05/19/2018 0936   VLDL 12 05/19/2018 0936   LDLCALC 84 05/19/2018 0936   LDLCALC 100 (H) 12/22/2014 1008    PHYSICAL EXAM:    VS:  BP 110/60 (BP Location: Left Arm, Patient Position: Sitting, Cuff Size: Normal)   Pulse (!) 52   Ht 5\' 6"  (1.676 m)   Wt 142 lb 8 oz (64.6 kg)   BMI 23.00 kg/m   BMI: Body mass index is 23 kg/m.  Physical Exam  Constitutional: She is oriented to person, place, and time. She appears well-developed and well-nourished.  HENT:  Head: Normocephalic and atraumatic.  Eyes: Right eye exhibits no discharge. Left eye exhibits no discharge.  Neck: Normal range of motion. No JVD present.  Cardiovascular: Regular rhythm, S1 normal, S2 normal and normal heart sounds. Bradycardia present. Exam reveals no distant heart sounds, no friction rub, no midsystolic click and no opening snap.  No murmur heard. Pulses:      Posterior tibial pulses are 2+ on the right side and 2+ on the left side.  Pulmonary/Chest: Effort normal and breath sounds normal. No respiratory distress. She has no decreased breath sounds. She has no wheezes. She has no rales. She exhibits no tenderness.  Abdominal: Soft. She exhibits no distension. There is no abdominal tenderness.  Musculoskeletal:        General: No edema.  Neurological: She is alert and oriented to person, place, and time.  Skin: Skin is warm and dry. No cyanosis. Nails show no clubbing.  Psychiatric: She has a normal mood and affect. Her speech is normal and behavior is  normal. Judgment and thought content normal.    Wt Readings from Last 3 Encounters:  04/09/19 142 lb 8 oz (64.6 kg)  11/21/18 136 lb (61.7 kg)  06/05/18 136 lb 12 oz (62 kg)     ASSESSMENT & PLAN:   1. PAF: Maintaining sinus rhythm with a bradycardic heart rate.  She continues to note paroxysms of palpitations concerning for A. fib, particularly at night when lying down.  In this setting, we have agreed to place a ZIO monitor to evaluate for paroxysms of A. fib and A. fib burden.  If she is found to have significant breakthrough A. fib burden we will need to readdress her flecainide.  With her bradycardic heart rate we did discuss tapering of metoprolol though she would prefer to monitor this at this time.  In this setting, she will continue current doses of flecainide, metoprolol, and Xarelto.  No concerns of bleeding.  Recent CBC demonstrated stable hemoglobin as outlined above.  2. Coronary artery calcium: She continues to live a very active lifestyle without any symptoms concerning for angina.  LDL at goal as outlined above.  Tolerating Crestor with addition of co-Q10.  No plans for ischemic evaluation at this time.  Continue risk factor modification.  3. Mitral regurgitation: No murmur noted on exam today.  Update echo.  4. HLD: LDL of 69 from 02/2019 with normal liver function at that time.  Continue Crestor with CoQ10.  5. Insomnia: Patient is concerned she  may have sleep apnea.  We have agreed to update echo initially as outlined above to evaluate her RV and right-sided pressure.  If these are significantly abnormal we will proceed with referral to pulmonology for consideration of sleep study.  Disposition: F/u with Dr. Rockey Situ or an APP in 2 months.   Medication Adjustments/Labs and Tests Ordered: Current medicines are reviewed at length with the patient today.  Concerns regarding medicines are outlined above. Medication changes, Labs and Tests ordered today are summarized above and  listed in the Patient Instructions accessible in Encounters.   Signed, Christell Faith, PA-C 04/09/2019 11:45 AM     [address], [place] [address] [phone]

## 2019-04-07 DIAGNOSIS — L814 Other melanin hyperpigmentation: Secondary | ICD-10-CM | POA: Diagnosis not present

## 2019-04-07 DIAGNOSIS — L820 Inflamed seborrheic keratosis: Secondary | ICD-10-CM | POA: Diagnosis not present

## 2019-04-07 DIAGNOSIS — L821 Other seborrheic keratosis: Secondary | ICD-10-CM | POA: Diagnosis not present

## 2019-04-07 DIAGNOSIS — L578 Other skin changes due to chronic exposure to nonionizing radiation: Secondary | ICD-10-CM | POA: Diagnosis not present

## 2019-04-09 ENCOUNTER — Ambulatory Visit (INDEPENDENT_AMBULATORY_CARE_PROVIDER_SITE_OTHER): Payer: PPO

## 2019-04-09 ENCOUNTER — Encounter: Payer: Self-pay | Admitting: Physician Assistant

## 2019-04-09 ENCOUNTER — Ambulatory Visit (INDEPENDENT_AMBULATORY_CARE_PROVIDER_SITE_OTHER): Payer: PPO | Admitting: Physician Assistant

## 2019-04-09 ENCOUNTER — Other Ambulatory Visit: Payer: Self-pay

## 2019-04-09 VITALS — BP 110/60 | HR 52 | Ht 66.0 in | Wt 142.5 lb

## 2019-04-09 DIAGNOSIS — M816 Localized osteoporosis [Lequesne]: Secondary | ICD-10-CM | POA: Diagnosis not present

## 2019-04-09 DIAGNOSIS — I480 Paroxysmal atrial fibrillation: Secondary | ICD-10-CM | POA: Diagnosis not present

## 2019-04-09 DIAGNOSIS — E782 Mixed hyperlipidemia: Secondary | ICD-10-CM | POA: Diagnosis not present

## 2019-04-09 DIAGNOSIS — K219 Gastro-esophageal reflux disease without esophagitis: Secondary | ICD-10-CM | POA: Diagnosis not present

## 2019-04-09 DIAGNOSIS — E785 Hyperlipidemia, unspecified: Secondary | ICD-10-CM

## 2019-04-09 DIAGNOSIS — R931 Abnormal findings on diagnostic imaging of heart and coronary circulation: Secondary | ICD-10-CM

## 2019-04-09 DIAGNOSIS — I340 Nonrheumatic mitral (valve) insufficiency: Secondary | ICD-10-CM | POA: Diagnosis not present

## 2019-04-09 DIAGNOSIS — G4700 Insomnia, unspecified: Secondary | ICD-10-CM | POA: Diagnosis not present

## 2019-04-09 DIAGNOSIS — Z0000 Encounter for general adult medical examination without abnormal findings: Secondary | ICD-10-CM | POA: Diagnosis not present

## 2019-04-09 DIAGNOSIS — F419 Anxiety disorder, unspecified: Secondary | ICD-10-CM | POA: Diagnosis not present

## 2019-04-09 DIAGNOSIS — I482 Chronic atrial fibrillation, unspecified: Secondary | ICD-10-CM | POA: Diagnosis not present

## 2019-04-09 NOTE — Patient Instructions (Addendum)
Medication Instructions:  Your physician recommends that you continue on your current medications as directed. Please refer to the Current Medication list given to you today.  If you need a refill on your cardiac medications before your next appointment, please call your pharmacy.   Lab work: None ordered If you have labs (blood work) drawn today and your tests are completely normal, you will receive your results only by: Marland Kitchen MyChart Message (if you have MyChart) OR . A paper copy in the mail If you have any lab test that is abnormal or we need to change your treatment, we will call you to review the results.  Testing/Procedures: Your physician has requested that you have an echocardiogram. Echocardiography is a painless test that uses sound waves to create images of your heart. It provides your doctor with information about the size and shape of your heart and how well your heart's chambers and valves are working. This procedure takes approximately one hour. There are no restrictions for this procedure.  Your physician has recommended that you wear an zio monitor. Zio monitors are medical devices that record the heart's electrical activity. Doctors most often [place] these monitors to diagnose arrhythmias. Arrhythmias are problems with the speed or rhythm of the heartbeat. The monitor is a small, portable device. You can wear one while you do your normal daily activities. This is usually used to diagnose what is causing palpitations/syncope (passing out).     Follow-Up: At [facility], you and your health needs are our priority.  As part of our continuing mission to provide you with exceptional heart care, we have created designated Provider Care Teams.  These Care Teams include your primary Cardiologist (physician) and Advanced Practice Providers (APPs -  Physician Assistants and Nurse Practitioners) who all work together to provide you with the care you need, when you need it. You will need a  follow up appointment in 2 months.   You may see Ida Rogue, MD or one of the following Advanced Practice Providers on your designated Care Team:   Murray Hodgkins, NP Christell Faith, PA-C . Marrianne Mood, PA-C  Any Other Special Instructions Will Be Listed Below (If Applicable).  Your physician has recommended that you wear a Zio monitor. This monitor is a medical device that records the heart's electrical activity. Doctors most often use these monitors to diagnose arrhythmias. Arrhythmias are problems with the speed or rhythm of the heartbeat. The monitor is a small device applied to your chest. You can wear one while you do your normal daily activities. While wearing this monitor if you have any symptoms to push the button and record what you felt. Once you have worn this monitor for the period of time provider prescribed (Usually 14 days), you will return the monitor device in the postage paid box. Once it is returned they will download the data collected and provide [place] with a report which the provider will then review and we will call you with those results. Important tips:  1. Avoid showering during the first 24 hours of wearing the monitor. 2. Avoid excessive sweating to help maximize wear time. 3. Do not submerge the device, no hot tubs, and no swimming pools. 4. Keep any lotions or oils away from the patch. 5. After 24 hours you may shower with the patch on. Take brief showers with your back facing the shower head.  6. Do not remove patch once it has been placed because that will interrupt data and decrease adhesive  wear time. 7. Push the button when you have any symptoms and write down what you were feeling. 8. Once you have completed wearing your monitor, remove and place into box which has postage paid and place in your outgoing mailbox.  9. If for some reason you have misplaced your box then call our office and we can provide another box and/or mail it off for you.

## 2019-04-22 ENCOUNTER — Telehealth: Payer: Self-pay | Admitting: Cardiovascular Disease

## 2019-04-22 DIAGNOSIS — Z23 Encounter for immunization: Secondary | ICD-10-CM | POA: Diagnosis not present

## 2019-04-22 NOTE — Telephone Encounter (Signed)
To office manager to advise.

## 2019-04-22 NOTE — Telephone Encounter (Signed)
Patient wants over 65 flu vaccine and was unable to get at pcp .  Patient just had ov but could not have 2 vaccines so close together .

## 2019-04-23 ENCOUNTER — Ambulatory Visit: Payer: PPO | Admitting: Physician Assistant

## 2019-04-24 NOTE — Telephone Encounter (Signed)
Spoke with the pt and she has already received her Flu Shot at [facility].

## 2019-04-28 DIAGNOSIS — F4323 Adjustment disorder with mixed anxiety and depressed mood: Secondary | ICD-10-CM | POA: Diagnosis not present

## 2019-05-01 DIAGNOSIS — I482 Chronic atrial fibrillation, unspecified: Secondary | ICD-10-CM | POA: Diagnosis not present

## 2019-05-12 ENCOUNTER — Ambulatory Visit (INDEPENDENT_AMBULATORY_CARE_PROVIDER_SITE_OTHER): Payer: PPO

## 2019-05-12 ENCOUNTER — Other Ambulatory Visit: Payer: Self-pay

## 2019-05-12 DIAGNOSIS — I340 Nonrheumatic mitral (valve) insufficiency: Secondary | ICD-10-CM

## 2019-05-18 DIAGNOSIS — F4323 Adjustment disorder with mixed anxiety and depressed mood: Secondary | ICD-10-CM | POA: Diagnosis not present

## 2019-05-21 ENCOUNTER — Ambulatory Visit: Payer: Self-pay | Admitting: Nurse Practitioner

## 2019-05-27 ENCOUNTER — Ambulatory Visit: Payer: PPO | Admitting: Internal Medicine

## 2019-06-02 ENCOUNTER — Encounter: Payer: Self-pay | Admitting: Cardiovascular Disease

## 2019-06-02 ENCOUNTER — Ambulatory Visit (INDEPENDENT_AMBULATORY_CARE_PROVIDER_SITE_OTHER): Payer: PPO | Admitting: Cardiovascular Disease

## 2019-06-02 ENCOUNTER — Other Ambulatory Visit: Payer: Self-pay

## 2019-06-02 VITALS — BP 174/70 | HR 65 | Ht 66.0 in | Wt 143.0 lb

## 2019-06-02 DIAGNOSIS — I340 Nonrheumatic mitral (valve) insufficiency: Secondary | ICD-10-CM

## 2019-06-02 DIAGNOSIS — I480 Paroxysmal atrial fibrillation: Secondary | ICD-10-CM

## 2019-06-02 DIAGNOSIS — R931 Abnormal findings on diagnostic imaging of heart and coronary circulation: Secondary | ICD-10-CM | POA: Diagnosis not present

## 2019-06-02 DIAGNOSIS — E785 Hyperlipidemia, unspecified: Secondary | ICD-10-CM | POA: Diagnosis not present

## 2019-06-02 NOTE — Progress Notes (Signed)
Cardiology Office Note  Date:  06/02/2019   ID:  [name] [birth year], MRN [id number]  PCP:  Heather Elk, MD   Chief Complaint  Patient presents with  . Other    2 month follow up. Meds reviewed verbally with patient.     HPI:  [name] is a 73 year old woman with a history of  palpitations and tachycardia,  paroxysmal atrial fibrillation ,cardioversion. 2013 smoking history, stopped in 2012 CT coronary calcium score showing Mid LAD coronary calcification, score of 50 in 2016 who presents for routine followup of her atrial fibrillation.  Living at the [place], very active Relaxed  Did not get right arm fixed  suffered a torn biceps, husband with car accident  Sits down when heart rate near 100 Relaxes, catches it before atrial fib comes on Rare atrial fib spells Previously reported Stress may be triggering more episodes Xanax evetry night for sleep Taking flecainide 50 twice daily Rarely taking extra flecainide only after 30 minutes of atrial fibrillation  Denies any chest pain concerning for angina Rare sweats/diaphoresis Never had sleep study, she has chronic insomnia  Echo: 05/12/19  1. Left ventricular ejection fraction, by visual estimation, is 55 to 60%. The left ventricle has normal function. Normal left ventricular size. There is no left ventricular hypertrophy.  2. Global right ventricle has normal systolic function.The right ventricular size is normal. No increase in right ventricular wall thickness.  3. Left atrial size was normal.  4. Right atrial size was normal.  5. Mild mitral valve prolapse.  6. The mitral valve is myxomatous. Mild to moderate mitral valve regurgitation.  Monitor 05/06/2019 results reviewed with her avg HR of 62 bpm.  ---1 run of Ventricular Tachycardia occurred lasting 7 beats with a max rate of 158 bpm (avg 136 bpm).  ---95 Supraventricular Tachycardia runs occurred, the run with the fastest interval lasting 7 beats  with a max rate of 214 bpm, the longest lasting 13 beats with an avg rate of 115 bpm.   EKG personally reviewed by myself on todays visit Shows sinus bradycardia rate 65 bpm no significant ST or T-wave changes  Other past medical history reviewed Previous hospital evaluation at [facility] about 3 weeks ago with chest pain that radiated through to her back Records have been requested Was at [place], developed chest pain on the left Very painful, lasted couple hours, 10/10 Through to back Hurt to take a breath in Received IV meds (morphine?)  enz negative per the patient scan looked ok  (CT? Or V/Q ?). Was told she had thickening of her esophagus  02/2016  CT ABD:  Aortic atherosclerosis.  Previous CT coronary calcium score of 50, most of her disease in the mid LAD Mild aorta atherosclerosis  Previously reported having diverticulitis, ulcerative colitis.   PMH:   has a past medical history of Arthritis, Broken ribs, C. difficile diarrhea, Cataract, Collagenous colitis, Coronary Calcium, Diverticulosis, Esophageal candidiasis ([place]), GERD (gastroesophageal reflux disease), Heart murmur, Hiatal hernia (2004), Hyperlipidemia, IBS (irritable bowel syndrome), Internal hemorrhoids (2010), Kidney stones, Midsternal chest pain, Migraine (01/16/12), Mild Mitral regurgitation, Osteoporosis, PAF (paroxysmal atrial fibrillation) ([place]), Pancreas divisum of native pancreas, Pterygium eye, bilateral, Schatzki's ring (2010), Small intestinal bacterial overgrowth (06/25/2018), Squamous cell carcinoma of skin, and Ulcerative colitis ([place]).  PSH:    Past Surgical History:  Procedure Laterality Date  . APPENDECTOMY  1963  . BREAST CYST ASPIRATION Bilateral    "several; both sides"  . CARDIOVERSION  01/16/2012  Procedure: CARDIOVERSION;  Surgeon: Evans Lance, MD;  Location: [facility];  Service: Cardiovascular;  Laterality: N/A;  . COLONOSCOPY  Multiple  . CYSTOSCOPY W/ STONE MANIPULATION  1980's   . EYE SURGERY     x4  . LEG SURGERY     skin cancer removal  . OVARIAN CYST REMOVAL  1990's  . SHOULDER ARTHROSCOPY W/ ROTATOR CUFF REPAIR Right ~ 2005  . SQUAMOUS CELL CARCINOMA EXCISION     "10-12 removed; all over my body - nose, predominately legs"  . TONSILLECTOMY AND ADENOIDECTOMY  1975  . UPPER GASTROINTESTINAL ENDOSCOPY  Multiple   w/dilation, hiatal hernia  . VAGINAL HYSTERECTOMY  1972    Current Outpatient Medications  Medication Sig Dispense Refill  . ALPRAZolam (XANAX) 0.5 MG tablet Take 1/2 to 1 tablet po BID prn anxiety 135 tablet 1  . Coenzyme Q10 (COQ10 PO) Take 1 tablet by mouth daily.    Marland Kitchen diltiazem (CARDIZEM) 30 MG tablet Take 1 tablet (30 mg total) by mouth 3 (three) times daily as needed (for breakthrough atrial fibrillation). 90 tablet 4  . diphenoxylate-atropine (LOMOTIL) 2.5-0.025 MG tablet Take 1 tablet by mouth 4 (four) times daily as needed for diarrhea or loose stools. 90 tablet 1  . escitalopram (LEXAPRO) 10 MG tablet Take 1 tablet (10 mg total) by mouth daily. 90 tablet 3  . famciclovir (FAMVIR) 500 MG tablet Take 1 tablet (500 mg total) by mouth 2 (two) times daily. 30 tablet 0  . famotidine (PEPCID) 40 MG tablet Take 1 tablet (40 mg total) by mouth 2 (two) times daily before a meal. Breakfast and supper 60 tablet 11  . flecainide (TAMBOCOR) 50 MG tablet TAKE 1 AND 1/2 TABLETS (75MG  TOTAL) BY MOUTH TWICE A DAY 270 tablet 1  . ibandronate (BONIVA) 150 MG tablet Take 1 tablet (150 mg total) by mouth every 30 (thirty) days. 1 tablet 5  . metoprolol succinate (TOPROL-XL) 25 MG 24 hr tablet Take 1 tablet (25 mg total) by mouth daily. 30 tablet 5  . nitroGLYCERIN (NITROSTAT) 0.4 MG SL tablet Place 1 tablet (0.4 mg total) under the tongue every 5 (five) minutes as needed for chest pain. 25 tablet 3  . Probiotic Product (VSL#3 DS PO) Take by mouth as needed.     . rosuvastatin (CRESTOR) 5 MG tablet TAKE 1 TABLET BY MOUTH ONCE DAILY 90 tablet 3  . XARELTO 20 MG  TABS tablet TAKE 1 TABLET BY MOUTH ONCE DAILY WITH SUPPER 90 tablet 1   No current facility-administered medications for this visit.      Allergies:   Latex, Adhesive [tape], Aspirin, and Prednisone   Social History:  The patient  reports that she quit smoking about 7 years ago. Her smoking use included cigarettes. She has a 4.00 pack-year smoking history. She has never used smokeless tobacco. She reports previous alcohol use. She reports that she does not use drugs.   Family History:   family history includes Alzheimer's disease in her mother; Diabetes in her father; Heart disease in her father; Irritable bowel syndrome in her father; Kidney disease in her father.    Review of Systems: Review of Systems  Constitutional: Negative.   Respiratory: Negative.   Cardiovascular: Positive for palpitations.  Gastrointestinal: Negative.   Musculoskeletal: Negative.   Neurological: Negative.   Psychiatric/Behavioral: Negative.   All other systems reviewed and are negative.   PHYSICAL EXAM: VS:  BP (!) 174/70 (BP Location: Left Arm, Patient Position: Sitting, Cuff Size: Normal)  Pulse 65   Ht 5\' 6"  (1.676 m)   Wt 143 lb (64.9 kg)   BMI 23.08 kg/m  , BMI Body mass index is 23.08 kg/m.  GEN: Well nourished, well developed, in no acute distress  HEENT: normal  Neck: no JVD, carotid bruits, or masses Cardiac: RRR; no murmurs, rubs, or gallops,no edema  Respiratory:  clear to auscultation bilaterally, normal work of breathing GI: soft, nontender, nondistended, + BS MS: no deformity or atrophy  Skin: warm and dry, no rash Neuro:  Strength and sensation are intact Psych: euthymic mood, full affect   Recent Labs: No results found for requested labs within last 8760 hours.    Lipid Panel Lab Results  Component Value Date   CHOL 161 05/19/2018   HDL 65 05/19/2018   LDLCALC 84 05/19/2018   TRIG 61 05/19/2018      Wt Readings from Last 3 Encounters:  06/02/19 143 lb (64.9 kg)   04/09/19 142 lb 8 oz (64.6 kg)  11/21/18 136 lb (61.7 kg)     ASSESSMENT AND PLAN:  Atrial fibrillation with rapid ventricular response (HCC) - Rare episodes of paroxysmal atrial fibrillation No medication changes Recommend she call us if symptoms get worse  Chest pain, unspecified type Low calcium score 2 years ago No further testing Very active at baseline with no anginal symptoms  Smoking We have encouraged her to continue to work on weaning her cigarettes and smoking cessation. She will continue to work on this and does not want any assistance with chantix.   Anxiety Reports that she is very relaxed, lives at the [facility]   Total encounter time more than 25 minutes  Greater than 50% was spent in counseling and coordination of care with the patient   Disposition:   F/U  12 months   No orders of the defined types were placed in this encounter.    Signed, Esmond Plants, M.D., Ph.D. 06/02/2019  [place], [place]

## 2019-06-02 NOTE — Patient Instructions (Signed)

## 2019-06-08 DIAGNOSIS — L570 Actinic keratosis: Secondary | ICD-10-CM | POA: Diagnosis not present

## 2019-06-08 DIAGNOSIS — L814 Other melanin hyperpigmentation: Secondary | ICD-10-CM | POA: Diagnosis not present

## 2019-06-08 DIAGNOSIS — L821 Other seborrheic keratosis: Secondary | ICD-10-CM | POA: Diagnosis not present

## 2019-06-08 DIAGNOSIS — L578 Other skin changes due to chronic exposure to nonionizing radiation: Secondary | ICD-10-CM | POA: Diagnosis not present

## 2019-06-08 DIAGNOSIS — L720 Epidermal cyst: Secondary | ICD-10-CM | POA: Diagnosis not present

## 2019-06-08 DIAGNOSIS — D485 Neoplasm of uncertain behavior of skin: Secondary | ICD-10-CM | POA: Diagnosis not present

## 2019-06-08 DIAGNOSIS — L820 Inflamed seborrheic keratosis: Secondary | ICD-10-CM | POA: Diagnosis not present

## 2019-06-23 DIAGNOSIS — Z20828 Contact with and (suspected) exposure to other viral communicable diseases: Secondary | ICD-10-CM | POA: Diagnosis not present

## 2019-06-24 ENCOUNTER — Telehealth: Payer: Self-pay | Admitting: Cardiovascular Disease

## 2019-06-24 NOTE — Telephone Encounter (Signed)
Patient currently takes Xarelto medication Patient has heard of a generic for Eliquis which is more affordable Patient would like to know if she may switch medications Please call to discuss

## 2019-06-24 NOTE — Telephone Encounter (Signed)
Spoke with patient at [employer] about insurance, medicare, and cost of her medications. She states that she is in the donut hole and saw commercial on TV that Eliquis would be going generic in January. Advised that she should call us a week or two before she runs out of Xarelto and we can check the price for Eliquis and also review if this switch is ok with provider. She verbalized understanding with no further questions at this time.

## 2019-06-29 DIAGNOSIS — F4323 Adjustment disorder with mixed anxiety and depressed mood: Secondary | ICD-10-CM | POA: Diagnosis not present

## 2019-06-30 ENCOUNTER — Ambulatory Visit: Payer: PPO | Admitting: Internal Medicine

## 2019-07-01 ENCOUNTER — Telehealth: Payer: Self-pay | Admitting: Internal Medicine

## 2019-07-01 NOTE — Telephone Encounter (Signed)
Left message for pt to call back  °

## 2019-07-01 NOTE — Telephone Encounter (Signed)
Pt requested to reschedule appt with PA, but schedule is blocked.

## 2019-07-02 NOTE — Telephone Encounter (Signed)
Pt had an appt with Carlean Purl for Tuesday of next week but cannot come that day. Pt scheduled to see Nicoletta Ba PA Wednesday at 2pm. Pt aware of appt.

## 2019-07-07 ENCOUNTER — Ambulatory Visit: Payer: PPO | Admitting: Internal Medicine

## 2019-07-08 ENCOUNTER — Encounter: Payer: Self-pay | Admitting: Physician Assistant

## 2019-07-08 ENCOUNTER — Other Ambulatory Visit (INDEPENDENT_AMBULATORY_CARE_PROVIDER_SITE_OTHER): Payer: PPO

## 2019-07-08 ENCOUNTER — Ambulatory Visit: Payer: PPO | Admitting: Physician Assistant

## 2019-07-08 VITALS — BP 128/62 | HR 68 | Temp 97.9°F | Ht 66.0 in | Wt 146.0 lb

## 2019-07-08 DIAGNOSIS — R197 Diarrhea, unspecified: Secondary | ICD-10-CM | POA: Diagnosis not present

## 2019-07-08 DIAGNOSIS — K52831 Collagenous colitis: Secondary | ICD-10-CM | POA: Diagnosis not present

## 2019-07-08 LAB — CBC WITH DIFFERENTIAL/PLATELET
Basophils Absolute: 0.1 10*3/uL (ref 0.0–0.1)
Basophils Relative: 0.8 % (ref 0.0–3.0)
Eosinophils Absolute: 0.1 10*3/uL (ref 0.0–0.7)
Eosinophils Relative: 1.4 % (ref 0.0–5.0)
HCT: 39.6 % (ref 36.0–46.0)
Hemoglobin: 13.1 g/dL (ref 12.0–15.0)
Lymphocytes Relative: 31.5 % (ref 12.0–46.0)
Lymphs Abs: 2.3 10*3/uL (ref 0.7–4.0)
MCHC: 33.2 g/dL (ref 30.0–36.0)
MCV: 90 fl (ref 78.0–100.0)
Monocytes Absolute: 0.7 10*3/uL (ref 0.1–1.0)
Monocytes Relative: 9.1 % (ref 3.0–12.0)
Neutro Abs: 4.1 10*3/uL (ref 1.4–7.7)
Neutrophils Relative %: 57.2 % (ref 43.0–77.0)
Platelets: 199 10*3/uL (ref 150.0–400.0)
RBC: 4.4 Mil/uL (ref 3.87–5.11)
RDW: 13.7 % (ref 11.5–15.5)
WBC: 7.2 10*3/uL (ref 4.0–10.5)

## 2019-07-08 LAB — BASIC METABOLIC PANEL
BUN: 20 mg/dL (ref 6–23)
CO2: 32 mEq/L (ref 19–32)
Calcium: 9.2 mg/dL (ref 8.4–10.5)
Chloride: 104 mEq/L (ref 96–112)
Creatinine, Ser: 0.92 mg/dL (ref 0.40–1.20)
GFR: 59.69 mL/min — ABNORMAL LOW (ref >60.00)
Glucose, Bld: 82 mg/dL (ref 70–99)
Potassium: 3.9 mEq/L (ref 3.5–5.1)
Sodium: 141 mEq/L (ref 135–145)

## 2019-07-08 LAB — SEDIMENTATION RATE: Sed Rate: 14 mm/hr (ref 0–30)

## 2019-07-08 LAB — C-REACTIVE PROTEIN: CRP: <1 mg/dL (ref 0.5–20.0)

## 2019-07-08 MED ORDER — DIPHENOXYLATE-ATROPINE 2.5-0.025 MG PO TABS: ORAL_TABLET | ORAL | 2 refills | Status: DC

## 2019-07-08 NOTE — Progress Notes (Signed)
Subjective:    Patient ID: [name], female    DOB: [birth year], 73 y.o.   MRN: [id number]  HPI [name] is a pleasant 73 year old white female, known to Dr. Carlean Purl with history of collagenous colitis, IBS, history of SIBO for which she was treated in November 2019 with a course of Augmentin, and with history of previous C. difficile 2017.  She also has history of atrial fibrillation and is on Xarelto. She comes in today with a 1 month history of very malodorous stool and gas, mild abdominal cramping and bowel movements that have been ranging from foamy to watery to stringy to occasionally formed.  She has been seeing a lot of mucus.  She is also had 2 episodes of extreme urgency resulting in incontinence of stool.  Once happened when she was outside in her yard and the other last week while sitting in the car.  These obviously have been very distressing to her. She has not had any recent changes in medications, no recent antibiotics.  She cannot remember whether she had much response to the Augmentin in November 2019 or not. Last colonoscopy was done in 2015 which did confirm persistent collagenous colitis.  She did take a course of steroids at that time, she was unable to be treated with Entocort due to cost.  Appetite has been fine recently, no nausea or vomiting, no fever or chills.  Review of Systems Pertinent positive and negative review of systems were noted in the above HPI section.  All other review of systems was otherwise negative.  Outpatient Encounter Medications as of 07/08/2019  Medication Sig  . ALPRAZolam (XANAX) 0.5 MG tablet Take 1/2 to 1 tablet po BID prn anxiety  . Coenzyme Q10 (COQ10 PO) Take 1 tablet by mouth daily.  . diphenoxylate-atropine (LOMOTIL) 2.5-0.025 MG tablet Take 1 q am and then every 4 hours prn  . escitalopram (LEXAPRO) 10 MG tablet Take 1 tablet (10 mg total) by mouth daily.  . famciclovir (FAMVIR) 500 MG tablet Take 1 tablet (500 mg total) by mouth 2  (two) times daily. (Patient taking differently: Take 500 mg by mouth 2 (two) times daily as needed. )  . famotidine (PEPCID) 40 MG tablet Take 1 tablet (40 mg total) by mouth 2 (two) times daily before a meal. Breakfast and supper  . flecainide (TAMBOCOR) 50 MG tablet TAKE 1 AND 1/2 TABLETS ('75MG'$  TOTAL) BY MOUTH TWICE A DAY  . ibandronate (BONIVA) 150 MG tablet Take 1 tablet (150 mg total) by mouth every 30 (thirty) days.  . metoprolol succinate (TOPROL-XL) 25 MG 24 hr tablet Take 1 tablet (25 mg total) by mouth daily.  . nitroGLYCERIN (NITROSTAT) 0.4 MG SL tablet Place 1 tablet (0.4 mg total) under the tongue every 5 (five) minutes as needed for chest pain.  . rosuvastatin (CRESTOR) 5 MG tablet TAKE 1 TABLET BY MOUTH ONCE DAILY  . XARELTO 20 MG TABS tablet TAKE 1 TABLET BY MOUTH ONCE DAILY WITH SUPPER  . [DISCONTINUED] diltiazem (CARDIZEM) 30 MG tablet Take 1 tablet (30 mg total) by mouth 3 (three) times daily as needed (for breakthrough atrial fibrillation).  . [DISCONTINUED] diphenoxylate-atropine (LOMOTIL) 2.5-0.025 MG tablet Take 1 tablet by mouth 4 (four) times daily as needed for diarrhea or loose stools.  . [DISCONTINUED] Probiotic Product (VSL#3 DS PO) Take by mouth as needed.    No facility-administered encounter medications on file as of 07/08/2019.   Allergies  Allergen Reactions  . Latex   . Adhesive [  Tape] Rash  . Aspirin Other (See Comments)    Aggravates Acid Reflux  . Prednisone Palpitations    Made pts heart race   Patient Active Problem List   Diagnosis Date Noted  . Small intestinal bacterial overgrowth 06/25/2018  . Chronic atrial fibrillation ([place]) 05/16/2018  . Ovarian failure 05/16/2018  . Need for vaccination against Streptococcus pneumoniae using pneumococcal conjugate vaccine 13 05/16/2018  . Vitamin D deficiency 05/16/2018  . Urinary tract infection with hematuria 11/27/2017  . Dysuria 11/27/2017  . Generalized anxiety disorder 11/27/2017  . Paroxysmal  atrial fibrillation ([place]) 12/08/2016  . Lactose intolerance 08/06/2016  . Clostridium difficile colitis 04/01/2016  . Cellulitis 11/08/2015  . Screening for breast cancer 02/22/2014  . Anxiety 02/25/2012  . Smoking 01/23/2012  . Chest pain 02/08/2011  . GERD with stricture 12/29/2008  . IRRITABLE BOWEL SYNDROME 12/29/2008  . OSTEOPOROSIS 12/29/2008  . Tachycardia 12/29/2008  . PALPITATIONS 12/29/2008  . Collagenous colitis 10/08/2007   Social History   Socioeconomic History  . Marital status: Married    Spouse name: Not on file  . Number of children: 1  . Years of education: Not on file  . Highest education level: Not on file  Occupational History  . Occupation: retired  Tobacco Use  . Smoking status: Former Smoker    Packs/day: 0.10    Years: 40.00    Pack years: 4.00    Types: Cigarettes    Quit date: 01/16/2012    Years since quitting: 7.4  . Smokeless tobacco: Never Used  Substance and Sexual Activity  . Alcohol use: Not Currently    Alcohol/week: 0.0 standard drinks    Comment: rarely  . Drug use: No  . Sexual activity: Not on file  Other Topics Concern  . Not on file  Social History Narrative   Lives in [place] with husband.   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 Difficulty of Paying Living Expenses: Not on file  Food Insecurity:   . Worried About Charity fundraiser in the Last Year: Not on file  . Ran Out of Food in the Last Year: Not on file  Transportation Needs:   . Lack of Transportation (Medical): Not on file  . Lack of Transportation (Non-Medical): Not on file  Physical Activity:   . Days of Exercise per Week: Not on file  . Minutes of Exercise per Session: Not on file  Stress:   . Feeling of Stress : Not on file   Social Connections:   . Frequency of Communication with Friends and Family: Not on file  . Frequency of Social Gatherings with Friends and Family: Not on file  . Attends Religious Services: Not on file  . Active Member of Clubs or Organizations: Not on file  . Attends Archivist Meetings: Not on file  . Marital Status: Not on file  Intimate Partner Violence:   . Fear of Current or Ex-Partner: Not on file  . Emotionally Abused: Not on file  . Physically Abused: Not on file  . Sexually Abused: Not on file    Ms. Hudock's family history includes Alzheimer's disease in her mother; Diabetes in her father;  Heart disease in her father; Irritable bowel syndrome in her father; Kidney disease in her father.      Objective:    Vitals:   07/08/19 1349  BP: 128/62  Pulse: 68  Temp: 97.9 F (36.6 C)    Physical Exam Well-developed well-nourished older white female in no acute distress.   Weight 146, BMI 23.5  HEENT; nontraumatic normocephalic, EOMI, PER R LA, sclera anicteric. Oropharynx; not examined/mask/Covid Neck; supple, no JVD Cardiovascular; regular rate and rhythm with S1-S2, no murmur rub or gallop Pulmonary; Clear bilaterally Abdomen; soft, mildly tender bilateral lower quadrants no guarding, nondistended, no palpable mass or hepatosplenomegaly, bowel sounds are active Rectal; not done today Skin; benign exam, no jaundice rash or appreciable lesions Extremities; no clubbing cyanosis or edema skin warm and dry Neuro/Psych; alert and oriented x4, grossly nonfocal mood and affect appropriate       Assessment & Plan:   #24  73 year old white female with history of collagenous colitis, IBS, prior C. difficile 2017 and previously documented SIBO who comes in with 1 month history of change in bowel habits with very malodorous stool and gas, and change in bowel habits with stools ranging from foamy to watery to formed.  She has had some mild abdominal cramping, no fever,  no nausea. 2 recent episodes of extreme urgency with complete incontinence of diarrhea stool   #2 colon cancer surveillance-up-to-date with negative colonoscopy 2015 other than biopsy showing persistent collagenous colitis #3 atrial fibrillation 4.  Chronic anticoagulation-on Xarelto 5.  Anxiety  Plan; CBC with differential, be met, sed rate, CRP. GI path panel, C. Difficile PCR  Start Florastor 1 p.o. twice daily. She has been using Lomotil on a as needed basis, will start Lomotil 1 p.o. every morning then 1-2 every 4 hours as needed. We will hold on further treatment until labs and stool studies return.       Amy S Esterwood PA-C 07/08/2019   Cc: Marinda Elk, MD

## 2019-07-08 NOTE — Patient Instructions (Signed)
If you are age 73 or older, your body mass index should be between 23-30. Your Body mass index is 23.57 kg/m. If this is out of the aforementioned range listed, please consider follow up with your Primary Care Provider.  If you are age 56 or younger, your body mass index should be between 19-25. Your Body mass index is 23.57 kg/m. If this is out of the aformentioned range listed, please consider follow up with your Primary Care Provider.   Your provider has requested that you go to the basement level for lab work before leaving today. Press "B" on the elevator. The lab is located at the first door on the left as you exit the elevator.  We have sent the following medications to your pharmacy for you to pick up at your convenience:  Please pick up over the counter Florasor probiotic use 1 tablet twice daily.  We have sent a refill to your pharmacy for your Lomotil.  Thank you for choosing me and [facility]

## 2019-07-09 DIAGNOSIS — R197 Diarrhea, unspecified: Secondary | ICD-10-CM | POA: Diagnosis not present

## 2019-07-09 DIAGNOSIS — K52831 Collagenous colitis: Secondary | ICD-10-CM | POA: Diagnosis not present

## 2019-07-09 LAB — CLOSTRIDIUM DIFFICILE TOXIN B, QUALITATIVE, REAL-TIME PCR: Toxigenic C. Difficile by PCR: NOT DETECTED

## 2019-07-09 NOTE — Progress Notes (Signed)
Please let pt know her labs are normal

## 2019-07-13 ENCOUNTER — Ambulatory Visit
Admission: RE | Admit: 2019-07-13 | Discharge: 2019-07-13 | Disposition: A | Discharge status: Home / Self Care | Payer: PPO | Source: Ambulatory Visit | Attending: Physician Assistant | Admitting: Physician Assistant

## 2019-07-13 DIAGNOSIS — Z1231 Encounter for screening mammogram for malignant neoplasm of breast: Secondary | ICD-10-CM | POA: Diagnosis not present

## 2019-07-13 LAB — GASTROINTESTINAL PATHOGEN PANEL PCR
C. difficile Tox A/B, PCR: NOT DETECTED
Campylobacter, PCR: NOT DETECTED
Cryptosporidium, PCR: NOT DETECTED
E coli (ETEC) LT/ST PCR: NOT DETECTED
E coli (STEC) stx1/stx2, PCR: NOT DETECTED
E coli 0157, PCR: NOT DETECTED
Giardia lamblia, PCR: NOT DETECTED
Norovirus, PCR: NOT DETECTED
Rotavirus A, PCR: NOT DETECTED
Salmonella, PCR: NOT DETECTED
Shigella, PCR: NOT DETECTED

## 2019-07-14 ENCOUNTER — Encounter: Payer: Self-pay | Admitting: Internal Medicine

## 2019-07-14 ENCOUNTER — Other Ambulatory Visit: Payer: Self-pay

## 2019-07-14 ENCOUNTER — Telehealth: Payer: Self-pay | Admitting: Physician Assistant

## 2019-07-14 MED ORDER — AMOXICILLIN-POT CLAVULANATE 875-125 MG PO TABS: 1.0000 | ORAL_TABLET | Freq: Two times a day (BID) | ORAL | 0 refills | Status: AC

## 2019-07-14 NOTE — Telephone Encounter (Signed)
Patient aware.

## 2019-07-14 NOTE — Telephone Encounter (Signed)
Pt called stating that she was able to see her results from labs that she had done and they are all negative. However, she still has same sxs. She will be traveling out of town for the next two months so pt would like to know what she can do. Pls call her.

## 2019-07-14 NOTE — Telephone Encounter (Signed)
She apologizes for not getting your call. She is with her spouse at the [facility]. Spotty reception inside the building. She does feel this is very similar to what she had in 2019. She is interested in using the Augmentin again. She does remember it being effective.  Shall I send that to her pharmacy?

## 2019-07-14 NOTE — Telephone Encounter (Signed)
I think she is suffering from small intestinal bacterial overgrowth (again)  I had treated her in 2019 and we had not heard since so ? If she thinks the Augmentin Rx in 2019 had helped  I cannot tell from what is written in the chart if so (had asked her to follow-up but we have not heard from her since then until now)  So - see how she answers this ?  I tried to call both # but could not get her

## 2019-07-14 NOTE — Telephone Encounter (Signed)
Augmentin 875 mg bid x 10 days  She should let me/us know if that works (or not)  [place] so

## 2019-07-14 NOTE — Telephone Encounter (Signed)
Patient seen by Nicoletta Ba, PAC on 07/08/19 for c/o loose stools with complete incontinence once and malodorous intestinal gas. See that note for details. GI pathogen panel negative. Labs wnl's. Patient is taking Florastor BID. She is taking Lomotil PRN. States this is because if she takes it once, she will not have any bowel movement for 24 to 48 hours.  Her issues are unformed, foaming stools 2 to 3 times a day. Not related to meals. Urgent at times. "Gas that will clear the room." She stresses that she has always had IBS issues, but that this is different.  Please advise.  Thanks

## 2019-07-15 NOTE — Progress Notes (Signed)
Please let patient know GI pathogen panel and stool for C. difficile have resulted negative.  Hopefully diarrhea has improved, please get an update.

## 2019-07-22 ENCOUNTER — Ambulatory Visit: Payer: PPO | Attending: Internal Medicine

## 2019-07-22 DIAGNOSIS — Z20822 Contact with and (suspected) exposure to covid-19: Secondary | ICD-10-CM

## 2019-07-22 DIAGNOSIS — Z20828 Contact with and (suspected) exposure to other viral communicable diseases: Secondary | ICD-10-CM | POA: Diagnosis not present

## 2019-07-23 LAB — NOVEL CORONAVIRUS, NAA: SARS-CoV-2, NAA: NOT DETECTED

## 2019-07-27 ENCOUNTER — Other Ambulatory Visit: Payer: Self-pay | Admitting: Cardiovascular Disease

## 2019-07-27 NOTE — Telephone Encounter (Signed)
Xarelto 20mg  refill request received. Pt is 74years old, weight-66.2 kg, Crea-0.92 on 07/08/2019, last seen by Dr. Rockey Situ on 06/02/2019, Diagnosis-Afib, CrCl-56.52ml/min; Dose is appropriate based on dosing criteria. Will send in refill to requested pharmacy.

## 2019-07-27 NOTE — Telephone Encounter (Signed)
Please review for refill. Thanks!  

## 2019-07-30 DIAGNOSIS — F4323 Adjustment disorder with mixed anxiety and depressed mood: Secondary | ICD-10-CM | POA: Diagnosis not present

## 2019-07-31 DIAGNOSIS — K219 Gastro-esophageal reflux disease without esophagitis: Secondary | ICD-10-CM | POA: Diagnosis not present

## 2019-07-31 DIAGNOSIS — I482 Chronic atrial fibrillation, unspecified: Secondary | ICD-10-CM | POA: Diagnosis not present

## 2019-07-31 DIAGNOSIS — E782 Mixed hyperlipidemia: Secondary | ICD-10-CM | POA: Diagnosis not present

## 2019-07-31 DIAGNOSIS — F419 Anxiety disorder, unspecified: Secondary | ICD-10-CM | POA: Diagnosis not present

## 2019-07-31 DIAGNOSIS — M816 Localized osteoporosis [Lequesne]: Secondary | ICD-10-CM | POA: Diagnosis not present

## 2019-09-23 ENCOUNTER — Telehealth: Payer: Self-pay | Admitting: Cardiovascular Disease

## 2019-09-23 NOTE — Telephone Encounter (Signed)
Pt c/o medication issue:  1. Name of Medication: Xarelto   2. How are you currently taking this medication (dosage and times per day)? 20 mg po q d   3. Are you having a reaction (difficulty breathing--STAT)? no  4. What is your medication issue? Patient wants to know if there is an interaction with covid vaccine and xarelto

## 2019-09-23 NOTE — Telephone Encounter (Signed)
Spoke with patient and she is currently in [place] trying to get her vaccine. She wanted to make sure it was OK to get vaccine on the Xarelto. Advised that our providers are encouraging everyone to get vaccine. She verbalized understanding with no further questions at this time.

## 2019-09-24 ENCOUNTER — Other Ambulatory Visit: Payer: Self-pay

## 2019-09-24 DIAGNOSIS — M810 Age-related osteoporosis without current pathological fracture: Secondary | ICD-10-CM

## 2019-09-24 MED ORDER — IBANDRONATE SODIUM 150 MG PO TABS: 150.0000 mg | ORAL_TABLET | ORAL | 0 refills | Status: AC

## 2019-09-24 NOTE — Telephone Encounter (Signed)
Spoke with pt she on vacation she will call back for appt

## 2019-10-07 ENCOUNTER — Other Ambulatory Visit: Payer: Self-pay | Admitting: Cardiovascular Disease

## 2019-10-19 ENCOUNTER — Other Ambulatory Visit: Payer: Self-pay | Admitting: Cardiovascular Disease

## 2019-10-19 MED ORDER — ROSUVASTATIN CALCIUM 5 MG PO TABS: 5.0000 mg | ORAL_TABLET | Freq: Every day | ORAL | 1 refills | Status: DC

## 2019-10-19 NOTE — Telephone Encounter (Signed)
*  STAT* If patient is at the pharmacy, call can be transferred to refill team.   1. Which medications need to be refilled? (please list name of each medication and dose if known) crestor 5mg    2. Which pharmacy/location (including street and city if local pharmacy) is medication to be sent to?  [place] Phone# [phone]. Do they need a 30 day or 90 day supply? [place]

## 2019-10-19 NOTE — Telephone Encounter (Signed)
Requested Prescriptions   Signed Prescriptions Disp Refills   rosuvastatin (CRESTOR) 5 MG tablet 90 tablet 1    Sig: Take 1 tablet (5 mg total) by mouth daily.    Authorizing Provider: Minna Merritts    Ordering User: Raelene Bott, BRANDY L

## 2019-11-17 ENCOUNTER — Other Ambulatory Visit: Payer: Self-pay | Admitting: Cardiovascular Disease

## 2019-11-17 ENCOUNTER — Other Ambulatory Visit: Payer: Self-pay | Admitting: *Deleted

## 2019-11-17 MED ORDER — RIVAROXABAN 20 MG PO TABS: ORAL_TABLET | ORAL | 1 refills | Status: DC

## 2019-11-17 MED ORDER — METOPROLOL SUCCINATE ER 25 MG PO TB24: 25.0000 mg | ORAL_TABLET | Freq: Every day | ORAL | 1 refills | Status: DC

## 2019-11-17 NOTE — Telephone Encounter (Signed)
Prescription refill request for Xarelto received.   Last office visit: [place], 06/24/2019 Weight: 66.2kg Age: 74 y.o. Scr: 0.92, 07/08/2019 CrCl: 56.07 ml/min   Prescription refill sent.

## 2019-11-17 NOTE — Telephone Encounter (Signed)
Refill request for Xarelto  Requested Prescriptions   Signed Prescriptions Disp Refills   metoprolol succinate (TOPROL-XL) 25 MG 24 hr tablet 90 tablet 1    Sig: Take 1 tablet (25 mg total) by mouth daily.    Authorizing Provider: Minna Merritts    Ordering User: Raelene Bott,  L

## 2019-11-17 NOTE — Telephone Encounter (Signed)
*  STAT* If patient is at the pharmacy, call can be transferred to refill team.   1. Which medications need to be refilled? (please list name of each medication and dose if known) xarelto 20 mg and metoprolo succinate 25 mg  2. Which pharmacy/location (including street and city if local pharmacy) is medication to be sent to? [place]  3. Do they need a 30 day or 90 day supply? [place]

## 2019-11-26 ENCOUNTER — Encounter: Payer: Self-pay | Admitting: Dermatology

## 2019-11-26 ENCOUNTER — Ambulatory Visit: Payer: PPO | Admitting: Dermatology

## 2019-11-26 ENCOUNTER — Other Ambulatory Visit: Payer: Self-pay

## 2019-11-26 DIAGNOSIS — D692 Other nonthrombocytopenic purpura: Secondary | ICD-10-CM | POA: Diagnosis not present

## 2019-11-26 DIAGNOSIS — C44722 Squamous cell carcinoma of skin of right lower limb, including hip: Secondary | ICD-10-CM

## 2019-11-26 DIAGNOSIS — W57XXXA Bitten or stung by nonvenomous insect and other nonvenomous arthropods, initial encounter: Secondary | ICD-10-CM

## 2019-11-26 DIAGNOSIS — S30860A Insect bite (nonvenomous) of lower back and pelvis, initial encounter: Secondary | ICD-10-CM

## 2019-11-26 DIAGNOSIS — L578 Other skin changes due to chronic exposure to nonionizing radiation: Secondary | ICD-10-CM

## 2019-11-26 DIAGNOSIS — L821 Other seborrheic keratosis: Secondary | ICD-10-CM | POA: Diagnosis not present

## 2019-11-26 DIAGNOSIS — L820 Inflamed seborrheic keratosis: Secondary | ICD-10-CM | POA: Diagnosis not present

## 2019-11-26 DIAGNOSIS — D1800 Hemangioma unspecified site: Secondary | ICD-10-CM | POA: Diagnosis not present

## 2019-11-26 DIAGNOSIS — Z86007 Personal history of in-situ neoplasm of skin: Secondary | ICD-10-CM

## 2019-11-26 DIAGNOSIS — Z1283 Encounter for screening for malignant neoplasm of skin: Secondary | ICD-10-CM

## 2019-11-26 DIAGNOSIS — L814 Other melanin hyperpigmentation: Secondary | ICD-10-CM | POA: Diagnosis not present

## 2019-11-26 DIAGNOSIS — D229 Melanocytic nevi, unspecified: Secondary | ICD-10-CM

## 2019-11-26 DIAGNOSIS — D485 Neoplasm of uncertain behavior of skin: Secondary | ICD-10-CM

## 2019-11-26 NOTE — Patient Instructions (Signed)

## 2019-11-26 NOTE — Progress Notes (Signed)
Follow-Up Visit   Subjective  [name] is a 74 y.o. female who presents for the following: Annual Exam (History of multiple SCC).  The patient presents for total body skin examination for skin cancer screening and mole check.  The following portions of the chart were reviewed this encounter and updated as appropriate:  Tobacco  Allergies  Meds  Problems  Med Hx  Surg Hx  Fam Hx      Review of Systems:  No other skin or systemic complaints except as noted in HPI or Assessment and Plan.  Objective  Well appearing patient in no apparent distress; mood and affect are within normal limits.  A full examination was performed including scalp, head, eyes, ears, nose, lips, neck, chest, axillae, abdomen, back, buttocks, bilateral upper extremities, bilateral lower extremities, hands, feet, fingers, toes, fingernails, and toenails. All findings within normal limits unless otherwise noted below.  Objective  Multiple: Well healed scar with no evidence of recurrence, no lymphadenopathy.   Objective  arms, legs (7): Erythematous keratotic or waxy stuck-on papule or plaque.   Objective  Right Lower Leg - Posterior: 1.0 cm Hyperkeratotic papule  Objective  Mid Back: Pink papule.   Assessment & Plan    Skin cancer screening performed today.  Actinic Damage - diffuse scaly erythematous macules with underlying dyspigmentation - Recommend daily broad spectrum sunscreen SPF 30+ to sun-exposed areas, reapply every 2 hours as needed.  - Call for new or changing lesions.  Purpura - Violaceous macules and patches - Benign - Related to age, sun damage and/or use of blood thinners - Observe - Can use OTC arnica containing moisturizer such as Dermend Bruise Formula if desired - Call for worsening or other concerns  Seborrheic Keratoses - Stuck-on, waxy, tan-brown papules and plaques  - Discussed benign etiology and prognosis. - Observe - Call for any  changes  Lentigines - Scattered tan macules - Discussed due to sun exposure - Benign, observe - Call for any changes  Hemangiomas - Red papules - Discussed benign nature - Observe - Call for any changes  Melanocytic Nevi - Tan-brown and/or pink-flesh-colored symmetric macules and papules - Benign appearing on exam today - Observation - Call clinic for new or changing moles - Recommend daily use of broad spectrum spf 30+ sunscreen to sun-exposed areas.    History of squamous cell carcinoma in situ (SCCIS) of skin Multiple  Clear today. Observe   Inflamed seborrheic keratosis (7) arms, legs  Destruction of lesion - arms, legs Complexity: simple   Destruction method: cryotherapy   Informed consent: discussed and consent obtained   Timeout:  patient name, date of birth, surgical site, and procedure verified Lesion destroyed using liquid nitrogen: Yes   Region frozen until ice ball extended beyond lesion: Yes   Outcome: patient tolerated procedure well with no complications   Post-procedure details: wound care instructions given    Neoplasm of uncertain behavior of skin Right Lower Leg - Posterior  Epidermal / dermal shaving  Lesion length (cm):  1 Lesion width (cm):  1 Margin per side (cm):  0.3 Total excision diameter (cm):  1.6 Informed consent: discussed and consent obtained   Timeout: patient name, date of birth, surgical site, and procedure verified   Procedure prep:  Patient was prepped and draped in usual sterile fashion Prep type:  Isopropyl alcohol Anesthesia: the lesion was anesthetized in a standard fashion   Anesthetic:  1% lidocaine w/ epinephrine 1-100,000 buffered w/ 8.4% NaHCO3 Instrument used: flexible  razor blade   Hemostasis achieved with: pressure, aluminum chloride and electrodesiccation   Outcome: patient tolerated procedure well   Post-procedure details: sterile dressing applied and wound care instructions given   Dressing type: bandage  and petrolatum    Destruction of lesion Complexity: extensive   Destruction method: electrodesiccation and curettage   Informed consent: discussed and consent obtained   Timeout:  patient name, date of birth, surgical site, and procedure verified Procedure prep:  Patient was prepped and draped in usual sterile fashion Prep type:  Isopropyl alcohol Anesthesia: the lesion was anesthetized in a standard fashion   Anesthetic:  1% lidocaine w/ epinephrine 1-100,000 buffered w/ 8.4% NaHCO3 Curettage performed in three different directions: Yes   Electrodesiccation performed over the curetted area: Yes   Lesion length (cm):  1 Lesion width (cm):  1 Margin per side (cm):  0.3 Final wound size (cm):  1.6 Hemostasis achieved with:  pressure, aluminum chloride and electrodesiccation Outcome: patient tolerated procedure well with no complications   Post-procedure details: sterile dressing applied and wound care instructions given   Dressing type: bandage and petrolatum    Specimen 1 - Surgical pathology Differential Diagnosis: SCC vs other  Check Margins: No 1.0 cm Hyperkeratotic papule EDC today  Reaction to insect bite Mid Back Hydrocortisone cream Observe   Return in about 6 months (around 05/28/2020).  I, Ashok Cordia, CMA, am acting as scribe for Sarina Ser, MD .  Documentation: I have reviewed the above documentation for accuracy and completeness, and I agree with the above.  Sarina Ser, MD

## 2019-11-27 ENCOUNTER — Encounter: Payer: Self-pay | Admitting: Dermatology

## 2019-12-02 ENCOUNTER — Telehealth: Payer: Self-pay

## 2019-12-02 NOTE — Telephone Encounter (Signed)
-----   Message from Ralene Bathe, MD sent at 12/01/2019  1:22 PM EDT ----- Skin , right lower leg - posterior ATYPICAL SQUAMOUS PROLIFERATION, EVOLVING SQUAMOUS CELL CARCINOMA  Cancer- SCC Already treated Recheck next visit

## 2019-12-02 NOTE — Telephone Encounter (Signed)
Advised patient of results. She says her biopsy site was still bleeding as of yesterday. Advised her if she takes her bandage off today and is still bleeding, call the office and we will work her in this afternoon and cauterize.

## 2020-01-11 ENCOUNTER — Other Ambulatory Visit: Payer: Self-pay | Admitting: Cardiovascular Disease

## 2020-01-18 ENCOUNTER — Other Ambulatory Visit: Payer: Self-pay | Admitting: Cardiovascular Disease

## 2020-01-28 DIAGNOSIS — K529 Noninfective gastroenteritis and colitis, unspecified: Secondary | ICD-10-CM | POA: Diagnosis not present

## 2020-01-28 DIAGNOSIS — I482 Chronic atrial fibrillation, unspecified: Secondary | ICD-10-CM | POA: Diagnosis not present

## 2020-01-28 DIAGNOSIS — F419 Anxiety disorder, unspecified: Secondary | ICD-10-CM | POA: Diagnosis not present

## 2020-01-28 DIAGNOSIS — Z0000 Encounter for general adult medical examination without abnormal findings: Secondary | ICD-10-CM | POA: Diagnosis not present

## 2020-01-28 DIAGNOSIS — K219 Gastro-esophageal reflux disease without esophagitis: Secondary | ICD-10-CM | POA: Diagnosis not present

## 2020-01-28 DIAGNOSIS — M816 Localized osteoporosis [Lequesne]: Secondary | ICD-10-CM | POA: Diagnosis not present

## 2020-01-28 DIAGNOSIS — E782 Mixed hyperlipidemia: Secondary | ICD-10-CM | POA: Diagnosis not present

## 2020-01-29 DIAGNOSIS — K529 Noninfective gastroenteritis and colitis, unspecified: Secondary | ICD-10-CM | POA: Diagnosis not present

## 2020-02-09 ENCOUNTER — Other Ambulatory Visit: Payer: Self-pay | Admitting: Internal Medicine

## 2020-02-09 MED ORDER — AMOXICILLIN-POT CLAVULANATE 875-125 MG PO TABS: 1.0000 | ORAL_TABLET | Freq: Two times a day (BID) | ORAL | 0 refills | Status: DC

## 2020-02-13 IMAGING — US US ABDOMEN LIMITED
1 series · 14 of 25 positions shown · non-contrast
Comparison: CT 03/01/2016.

CLINICAL DATA: Right upper quadrant pain.

EXAM:
ULTRASOUND ABDOMEN LIMITED RIGHT UPPER QUADRANT

[Series 1: us abdomen limited · 0.15mm/px · 14 of 41 slices shown]
[im 1/41]
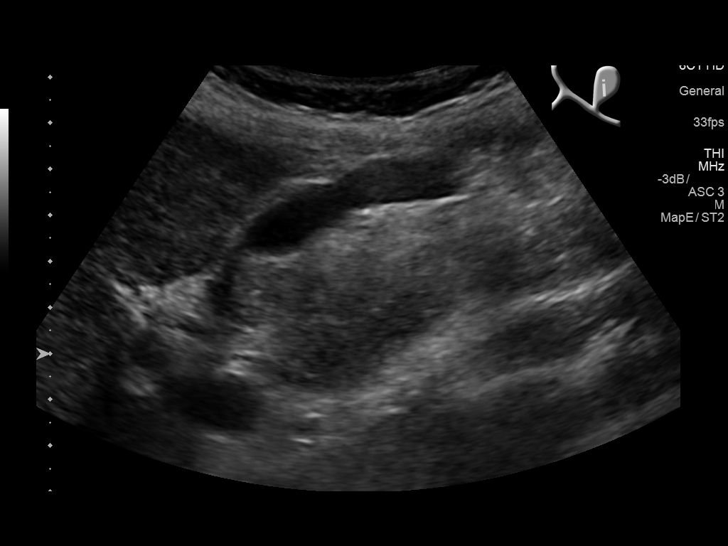
[im 4/41]
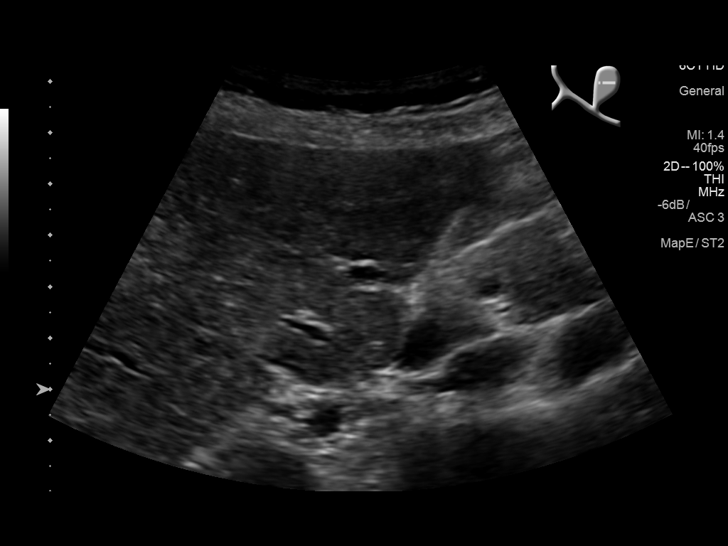
[im 7/41]
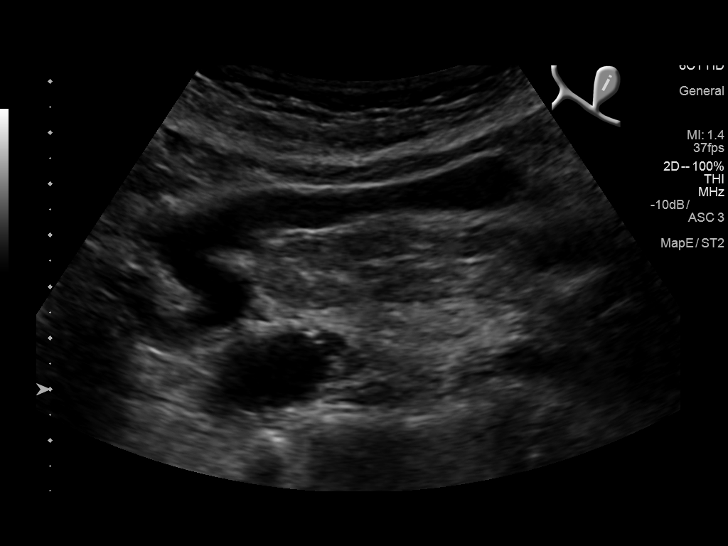
[im 11/41]
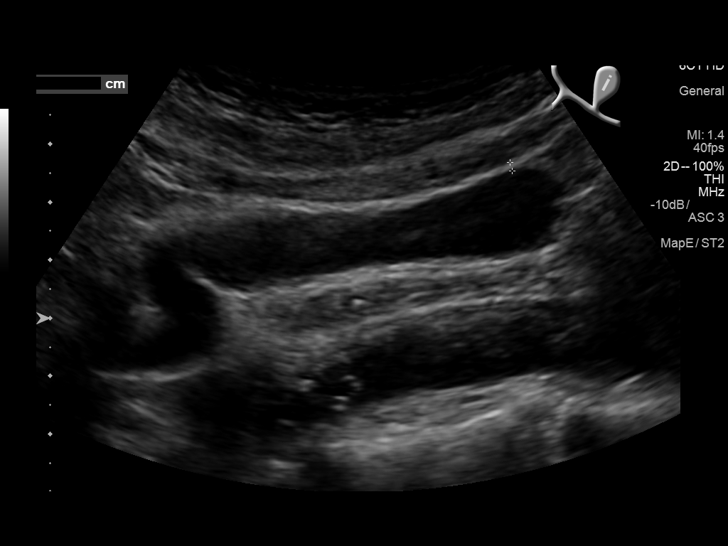
[im 14/41]
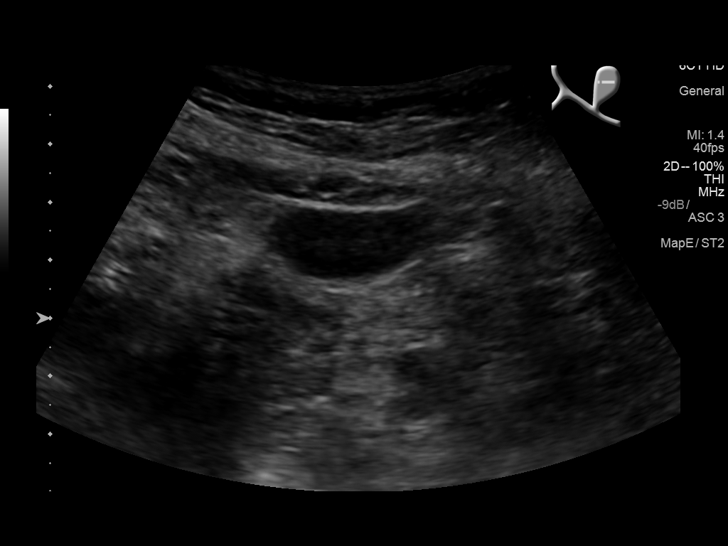
[im 16/41]
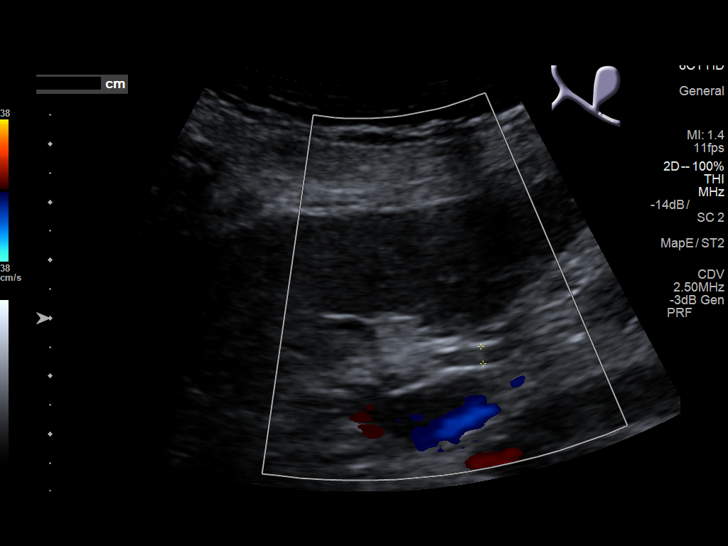
[im 19/41]
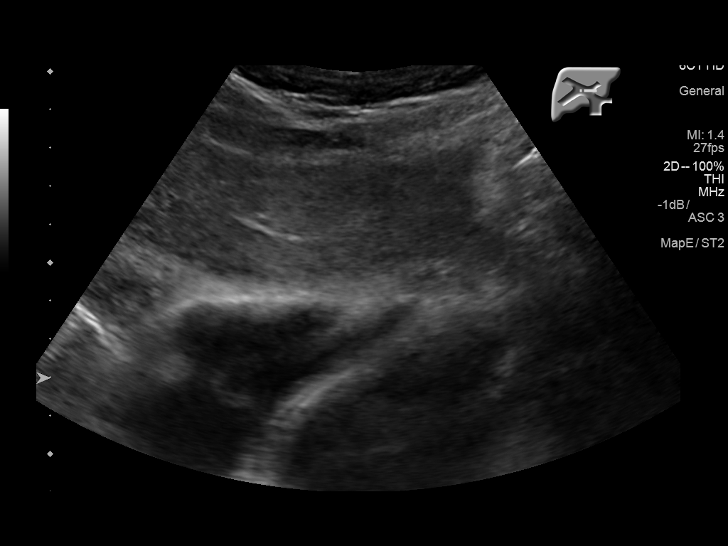
[im 22/41]
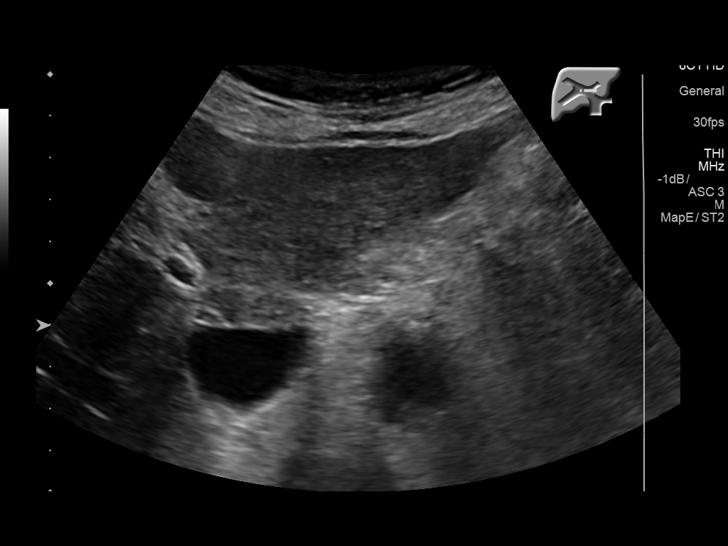
[im 26/41]
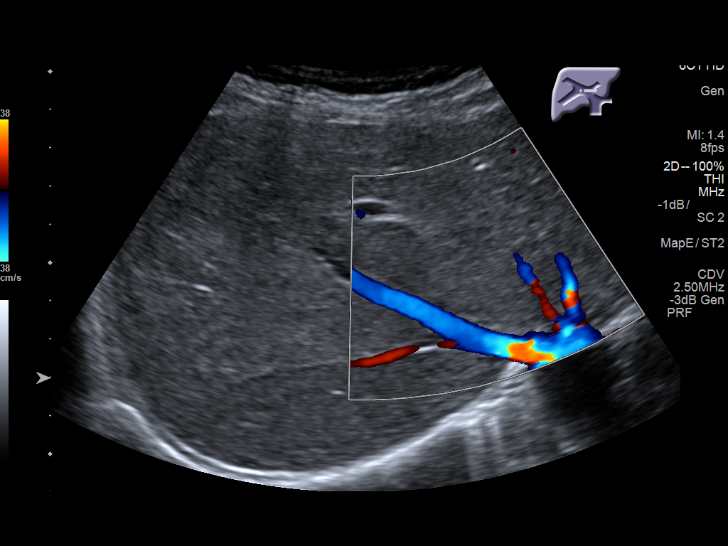
[im 27/41]
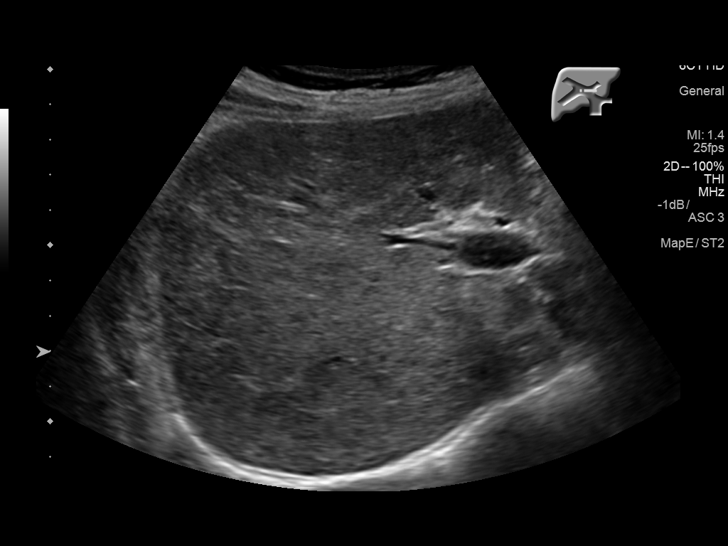
[im 31/41]
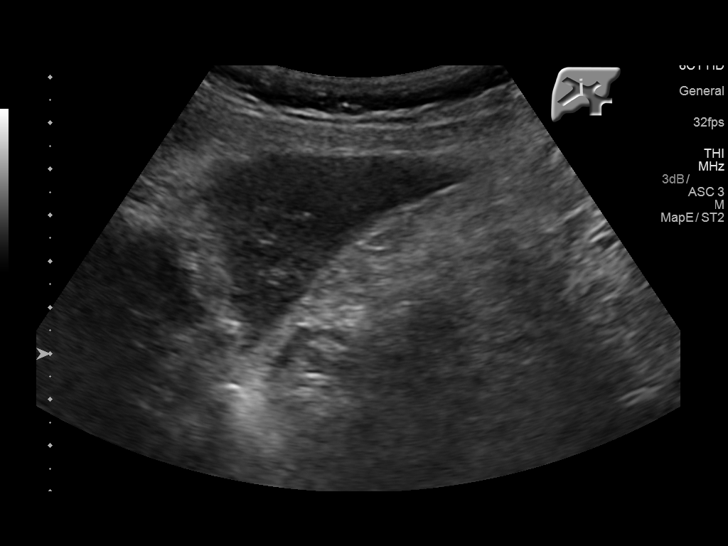
[im 34/41]
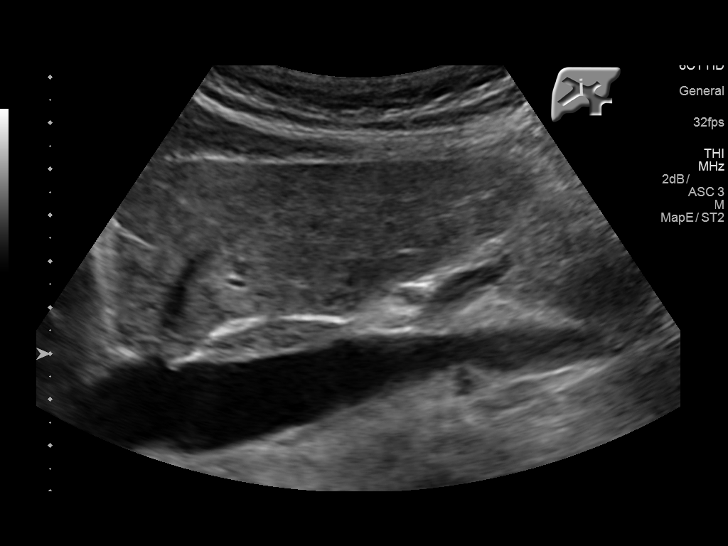
[im 37/41]
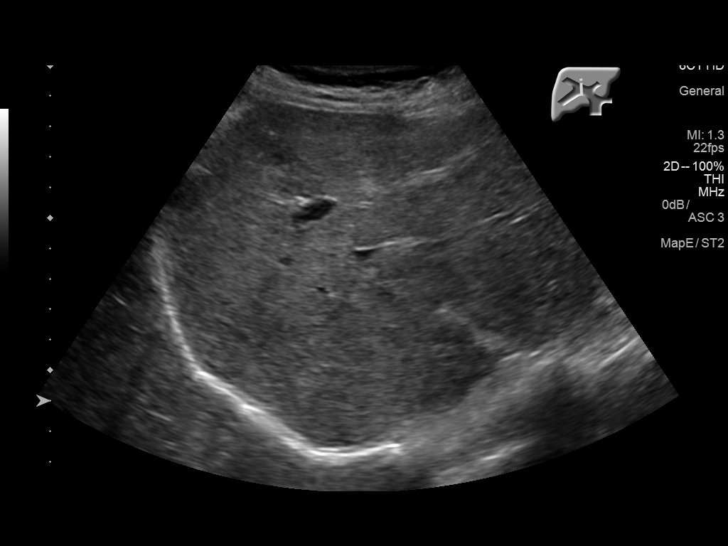
[im 41/41]
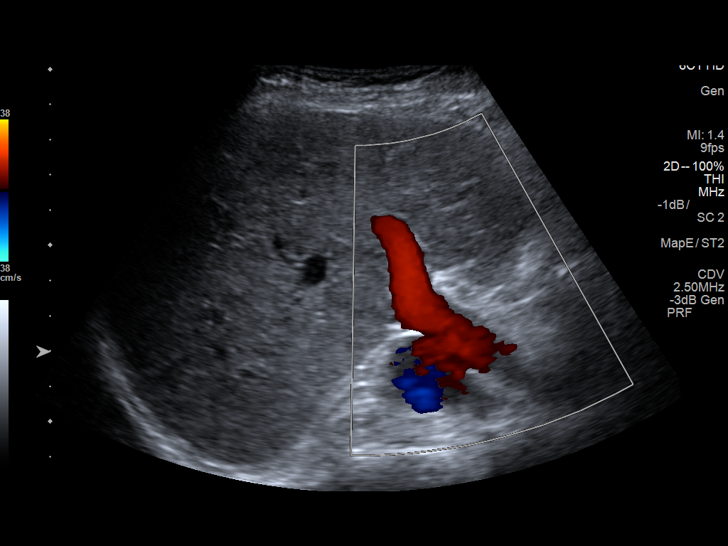

[14 of 25 positions shown; findings below may reference images not displayed]

FINDINGS: Gallbladder:

No gallstones or wall thickening visualized. No sonographic Murphy
sign noted by sonographer.

Common bile duct:

Diameter: 3 mm

Liver:

No focal lesion identified. Within normal limits in parenchymal
echogenicity. Portal vein is patent on color Doppler imaging with
normal direction of blood flow towards the liver.
IMPRESSION: No acute or focal abnormality identified.

## 2020-02-15 ENCOUNTER — Other Ambulatory Visit: Payer: Self-pay | Admitting: Cardiovascular Disease

## 2020-02-18 ENCOUNTER — Ambulatory Visit: Payer: PPO | Admitting: Physician Assistant

## 2020-02-18 ENCOUNTER — Other Ambulatory Visit: Payer: Self-pay

## 2020-02-18 ENCOUNTER — Ambulatory Visit: Payer: PPO | Admitting: Dermatology

## 2020-02-18 ENCOUNTER — Encounter: Payer: Self-pay | Admitting: Physician Assistant

## 2020-02-18 VITALS — BP 128/50 | HR 56 | Ht 66.0 in | Wt 145.8 lb

## 2020-02-18 DIAGNOSIS — L820 Inflamed seborrheic keratosis: Secondary | ICD-10-CM

## 2020-02-18 DIAGNOSIS — L578 Other skin changes due to chronic exposure to nonionizing radiation: Secondary | ICD-10-CM | POA: Diagnosis not present

## 2020-02-18 DIAGNOSIS — L720 Epidermal cyst: Secondary | ICD-10-CM

## 2020-02-18 DIAGNOSIS — K52831 Collagenous colitis: Secondary | ICD-10-CM

## 2020-02-18 MED ORDER — BUDESONIDE 3 MG PO CPEP
9.0000 mg | ORAL_CAPSULE | Freq: Every morning | ORAL | 0 refills | Status: DC
Start: 2020-02-18 — End: 2020-03-22

## 2020-02-18 MED ORDER — DICYCLOMINE HCL 10 MG PO CAPS
10.0000 mg | ORAL_CAPSULE | Freq: Three times a day (TID) | ORAL | 0 refills | Status: DC | PRN
Start: 2020-02-18 — End: 2022-02-12

## 2020-02-18 NOTE — Patient Instructions (Signed)
If you are age 74 or older, your body mass index should be between 23-30. Your Body mass index is 23.53 kg/m. If this is out of the aforementioned range listed, please consider follow up with your Primary Care Provider.  If you are age 22 or younger, your body mass index should be between 19-25. Your Body mass index is 23.53 kg/m. If this is out of the aformentioned range listed, please consider follow up with your Primary Care Provider.   START Budesumide 3 mg 3 tablets every morning START Bentyl 10 mg 1 tablet 3 times daily as needed for abdominal cramping and diarrhea.  You have been scheduled to follow up with Dr. Carlean Purl on 03/24/20 at 2:50 pm

## 2020-02-18 NOTE — Patient Instructions (Addendum)
  Cryotherapy Aftercare  . Wash gently with soap and water everyday.   . Apply Vaseline and Band-Aid daily until healed. Recommend daily broad spectrum sunscreen SPF 30+ to sun-exposed areas, reapply every 2 hours as needed. Call for new or changing lesions.  

## 2020-02-18 NOTE — Progress Notes (Signed)
Subjective:    Patient ID: [name], female    DOB: 06-19-1946, 74 y.o.   MRN: [id number]  HPI  [name] is a pleasant 74 year old white female, established with Dr. Carlean Purl who comes in today with complaints of diarrhea.  She was last seen by myself in December 2020, also with diarrhea.  At that time stool studies were done and negative and she was ultimately treated with a course of Augmentin for SIBO. Patient has history of IBS, collagenous colitis, prior history of C. difficile in 2017, history of SIBO, generalized anxiety disorder, lactose intolerance, atrial fibrillation and GERD.  She did have stool for C. difficile toxin and stool culture done by her PCP on 01/29/2020 and these were negative. She feels that her current symptoms have been coming on over the past couple of months with some increase in cramping and gas pains, occasional nausea.  She had started taking Lomotil and has generally been taking a couple per day.  She describes her stool as consistently loose to diarrhea and is not having any formed stools.  With the Lomotil it sounds as if she is having 1-3 bowel movements per day.  She had been called in a course of Augmentin late last week. This past weekend she says despite eating fairly bland, she had gone out to eat and then later that evening had multiple liquid bowel movements.  The following day after a couple of Lomotil she did not have any further diarrhea, and within 24 hours the diarrhea returned and she had an episode of incontinence.  She says she is tired of "living on Lomotil".  She has not had any fever or chills.  Does feel sort of weak.  She says her stool does not have a C. difficile odor.  She has not been on any recent antibiotics and no new meds.  Review of Systems Pertinent positive and negative review of systems were noted in the above HPI section.  All other review of systems was otherwise negative.  Outpatient Encounter Medications as of 02/18/2020  Medication  Sig  . ALPRAZolam (XANAX) 0.5 MG tablet Take 1/2 to 1 tablet po BID prn anxiety  . Coenzyme Q10 (COQ10 PO) Take 1 tablet by mouth daily.  . diphenoxylate-atropine (LOMOTIL) 2.5-0.025 MG tablet Take 1 q am and then every 4 hours prn  . escitalopram (LEXAPRO) 10 MG tablet Take 1 tablet (10 mg total) by mouth daily.  . famciclovir (FAMVIR) 500 MG tablet Take 1 tablet (500 mg total) by mouth 2 (two) times daily. (Patient taking differently: Take 500 mg by mouth 2 (two) times daily as needed. )  . flecainide (TAMBOCOR) 50 MG tablet TAKE 1 AND 1/2 TABS BY MOUTH TWICE A DAY  . ibandronate (BONIVA) 150 MG tablet Take 1 tablet (150 mg total) by mouth every 30 (thirty) days.  . metoprolol succinate (TOPROL-XL) 25 MG 24 hr tablet Take 1 tablet (25 mg total) by mouth daily.  . rivaroxaban (XARELTO) 20 MG TABS tablet TAKE 1 TABLET BY MOUTH ONCE A DAY WITH SUPPER.  . rosuvastatin (CRESTOR) 5 MG tablet TAKE 1 TABLET BY MOUTH ONCE DAILY  . budesonide (ENTOCORT EC) 3 MG 24 hr capsule Take 3 capsules (9 mg total) by mouth in the morning.  . dicyclomine (BENTYL) 10 MG capsule Take 1 capsule (10 mg total) by mouth 3 (three) times daily between meals as needed for spasms.  . famotidine (PEPCID) 40 MG tablet Take 1 tablet (40 mg total) by mouth  2 (two) times daily before a meal. Breakfast and supper  . nitroGLYCERIN (NITROSTAT) 0.4 MG SL tablet Place 1 tablet (0.4 mg total) under the tongue every 5 (five) minutes as needed for chest pain.  . [DISCONTINUED] amoxicillin-clavulanate (AUGMENTIN) 875-125 MG tablet Take 1 tablet by mouth 2 (two) times daily.   No facility-administered encounter medications on file as of 02/18/2020.   Allergies  Allergen Reactions  . Latex   . Adhesive [Tape] Rash  . Aspirin Other (See Comments)    Aggravates Acid Reflux  . Prednisone Palpitations    Made pts heart race   Patient Active Problem List   Diagnosis Date Noted  . Small intestinal bacterial overgrowth 06/25/2018  .  Chronic atrial fibrillation ([place]) 05/16/2018  . Ovarian failure 05/16/2018  . Need for vaccination against Streptococcus pneumoniae using pneumococcal conjugate vaccine 13 05/16/2018  . Vitamin D deficiency 05/16/2018  . Urinary tract infection with hematuria 11/27/2017  . Dysuria 11/27/2017  . Generalized anxiety disorder 11/27/2017  . Paroxysmal atrial fibrillation ([place]) 12/08/2016  . Lactose intolerance 08/06/2016  . Cellulitis 11/08/2015  . Screening for breast cancer 02/22/2014  . Anxiety 02/25/2012  . Smoking 01/23/2012  . Chest pain 02/08/2011  . GERD with stricture 12/29/2008  . IRRITABLE BOWEL SYNDROME 12/29/2008  . OSTEOPOROSIS 12/29/2008  . Tachycardia 12/29/2008  . PALPITATIONS 12/29/2008  . Collagenous colitis 10/08/2007   Social History   Socioeconomic History  . Marital status: Married    Spouse name: Not on file  . Number of children: 1  . Years of education: Not on file  . Highest education level: Not on file  Occupational History  . Occupation: retired  Tobacco Use  . Smoking status: Former Smoker    Packs/day: 0.10    Years: 40.00    Pack years: 4.00    Types: Cigarettes    Quit date: 01/16/2012    Years since quitting: 8.0  . Smokeless tobacco: Never Used  Vaping Use  . Vaping Use: Never used  Substance and Sexual Activity  . Alcohol use: Not Currently    Alcohol/week: 0.0 standard drinks    Comment: rarely  . Drug use: No  . Sexual activity: Not on file  Other Topics Concern  . Not on file  Social History Narrative   Lives in [place] with husband.   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 Difficulty of Paying Living Expenses:   Food Insecurity:   . Worried About Charity fundraiser in the Last Year:   . Youth worker in the Last Year:   Transportation Needs:   . Film/video editor (Medical):   Marland Kitchen Lack of Transportation (Non-Medical):   Physical Activity:   . Days of Exercise per Week:   . Minutes of Exercise per Session:   Stress:   . Feeling of Stress :   Social Connections:   . Frequency of Communication with Friends and Family:   . Frequency of Social Gatherings with Friends and Family:   . Attends Religious Services:   . Active Member of Clubs or Organizations:   . Attends Archivist Meetings:   Marland Kitchen Marital Status:  Intimate Partner Violence:   . Fear of Current or Ex-Partner:   . Emotionally Abused:   Marland Kitchen Physically Abused:   . Sexually Abused:     [name] family history includes Alzheimer's disease in her mother; Diabetes in her father; Heart disease in her father; Irritable bowel syndrome in her father; Kidney disease in her father.      Objective:    Vitals:   02/18/20 1331  BP: (!) 128/50  Pulse: 56    Physical Exam Well-developed well-nourished female/female in no acute distress.  Height, Weight,145 BMI 23.5  HEENT; nontraumatic normocephalic, EOMI, PER R LA, sclera anicteric. Oropharynx; not done Neck; supple, no JVD Cardiovascular; regular rate and rhythm with S1-S2, no murmur rub or gallop Pulmonary; Clear bilaterally Abdomen; soft,  Nondistended,mild tenderness upper abdomen,  And mid,  no palpable mass or hepatosplenomegaly, bowel sounds are active Rectal;not done Skin; benign exam, no jaundice rash or appreciable lesions Extremities; no clubbing cyanosis or edema skin warm and dry Neuro/Psych; alert and oriented x4, grossly nonfocal mood and affect appropriate      Assessment & Plan:   #13  74 year old white female with history of collagenous colitis, IBS, prior history of C. difficile and SIBO who has had recurrence of diarrhea over the past couple of months, now requiring Lomotil on a fairly regular basis.  She has had some associated  abdominal cramping and occasional nausea and bloating. I suspect her current symptoms are secondary to reactivation of collagenous colitis. Short trial of Augmentin seem to worsen symptoms  #2 GERD 3.  Lactose intolerance 4.  Anxiety disorder 5.  Atrial fibrillation  Plan; start budesonide 9 mg p.o. daily.  We will plan to taper slowly over the next few months. Start Bentyl 10 mg 3 times daily as needed Continue Pepcid 40 mg p.o. twice daily Office follow-up with Dr. Carlean Purl or myself in 1 month.   S  PA-C 02/18/2020   Cc: Marinda Elk, MD

## 2020-02-18 NOTE — Progress Notes (Signed)
   Follow-Up Visit   Subjective  [name] is a 74 y.o. female who presents for the following: Cyst that is growing and symptomatic below the right eye. She also has spots on her legs she would like checked that are sensitive and growing and irritating and traumatized. She would like these treated.  Patient here today for a cyst by right eye. Present for about 1 year but comes and goes. No drainage but it is sore to touch.   The following portions of the chart were reviewed this encounter and updated as appropriate:  Tobacco  Allergies  Meds  Problems  Med Hx  Surg Hx  Fam Hx      Review of Systems:  No other skin or systemic complaints except as noted in HPI or Assessment and Plan.  Objective  Well appearing patient in no apparent distress; mood and affect are within normal limits.  A focused examination was performed including face, legs. Relevant physical exam findings are noted in the Assessment and Plan.  Objective  Right medial infraorbital: 0.3cm Subcutaneous nodule.   Images    Objective  R lateral pretibial x 1, R popliteal x 1, L knee x 1, L pretibial x 1 (4): Erythematous keratotic or waxy stuck-on papule or plaque.    Assessment & Plan    Epidermal inclusion cyst Right medial infraorbital Benign, observe.  Discussed excision with 68mm punch if becomes bothersome. She will consider in the future.  Inflamed seborrheic keratosis (4) R lateral pretibial x 1, R popliteal x 1, L knee x 1, L pretibial x 1  Destruction of lesion - R lateral pretibial x 1, R popliteal x 1, L knee x 1, L pretibial x 1 Complexity: simple   Destruction method: cryotherapy   Informed consent: discussed and consent obtained   Timeout:  patient name, date of birth, surgical site, and procedure verified Lesion destroyed using liquid nitrogen: Yes   Region frozen until ice ball extended beyond lesion: Yes   Outcome: patient tolerated procedure well with no complications     Post-procedure details: wound care instructions given    Actinic Damage - diffuse scaly erythematous macules with underlying dyspigmentation - Recommend daily broad spectrum sunscreen SPF 30+ to sun-exposed areas, reapply every 2 hours as needed.  - Call for new or changing lesions.  Return for as scheduled.  Graciella Belton, RMA, am acting as scribe for Sarina Ser, MD . Documentation: I have reviewed the above documentation for accuracy and completeness, and I agree with the above.  Sarina Ser, MD

## 2020-02-19 ENCOUNTER — Encounter: Payer: Self-pay | Admitting: Dermatology

## 2020-02-23 ENCOUNTER — Encounter: Payer: Self-pay | Admitting: Physician Assistant

## 2020-02-25 ENCOUNTER — Telehealth: Payer: Self-pay | Admitting: Cardiovascular Disease

## 2020-02-25 NOTE — Telephone Encounter (Signed)
Spoke with patient. Patient sts that she had an episode of AFIB that awoke her from her sleep this morning.  Patient sts that she could feel the pounding in her chest and had some dizziness. Patient checked her BP and HR 149/9 137bpm. Patients sts that her BP machine read "irregular beat." Patient called her Engineer, civil (consulting). By the tome EMS arrived she converted back to NSR.  Patient taking Xarelto, Flecainide, and Metoprolol as prescribed. She takes her medications at night.  Patient is currently asymptomatic. She has been dx with UC and takes budesonide daily. Adv the patient to keep hydrated. Adv the patient that she is back in NSR and she is on the appropriated medications. Adv the patient that I will fwd the update to Dr. Rockey Situ. Patient adv to contact the interim if symptoms reoccur. Patient agreeable with the plan and voiced appreciation for the call.

## 2020-02-25 NOTE — Telephone Encounter (Signed)
Consider increasing metoprolol up to 37.5 mg daily Flecainide up to 100 twice daily Would monitor blood pressure at home We have mixed office notes some with low blood pressure My last visit with high blood pressure If she has recurrent atrial fibrillation spells would take a dose of diltiazem 30 mg Wait 1 hour and if not normal sinus rhythm would take additional diltiazem

## 2020-02-25 NOTE — Telephone Encounter (Signed)
Patient calling  Last night and into the morning patient was in afib - HR 137 BP 149/90 EMT came and stayed with patient until symptoms were maintained - HR now at 62 Patient calling to discuss and see if she should make an appt

## 2020-02-26 MED ORDER — DILTIAZEM HCL 30 MG PO TABS
30.0000 mg | ORAL_TABLET | Freq: Three times a day (TID) | ORAL | 3 refills | Status: DC | PRN
Start: 2020-02-26 — End: 2021-05-23

## 2020-02-26 MED ORDER — FLECAINIDE ACETATE 100 MG PO TABS
100.0000 mg | ORAL_TABLET | Freq: Two times a day (BID) | ORAL | 3 refills | Status: DC
Start: 2020-02-26 — End: 2022-02-23

## 2020-02-26 MED ORDER — METOPROLOL SUCCINATE ER 25 MG PO TB24
37.5000 mg | ORAL_TABLET | Freq: Every day | ORAL | 3 refills | Status: DC
Start: 2020-02-26 — End: 2021-03-30

## 2020-02-26 NOTE — Telephone Encounter (Signed)
Spoke with patient and reviewed provider recommendations. She was agreeable with these changes and updated prescriptions and confirmed all changes with her. She discussed how that they have been doing some renovations and thinks this is also a factor in her abnormal readings. She verbalized understanding of changes, agreement with plan, and had no further questions. [place] Wished her safe travels and no further questions.

## 2020-03-02 ENCOUNTER — Ambulatory Visit: Payer: PPO | Admitting: Nurse Practitioner

## 2020-03-22 ENCOUNTER — Other Ambulatory Visit: Payer: Self-pay | Admitting: Internal Medicine

## 2020-03-22 ENCOUNTER — Telehealth: Payer: Self-pay

## 2020-03-22 MED ORDER — BUDESONIDE 3 MG PO CPEP: 9.0000 mg | ORAL_CAPSULE | Freq: Every morning | ORAL | 0 refills | Status: DC

## 2020-03-22 NOTE — Telephone Encounter (Signed)
-----   Message from Gatha Mayer, MD sent at 03/22/2020  1:12 PM EDT ----- Regarding: needs appt Please try to find a late Sept early Oct spot for her and let her know  Could do one of our add ons or use a banding spot

## 2020-03-22 NOTE — Telephone Encounter (Signed)
I spoke with [name] and she will go pick up the Entocort sent in today. She said it has helped her a lot. She has made an appointment for October 1st at 2:50pm.

## 2020-03-24 ENCOUNTER — Ambulatory Visit: Payer: PPO | Admitting: Internal Medicine

## 2020-03-25 ENCOUNTER — Ambulatory Visit: Payer: PPO | Attending: Internal Medicine

## 2020-03-25 DIAGNOSIS — Z23 Encounter for immunization: Secondary | ICD-10-CM

## 2020-03-25 NOTE — Progress Notes (Signed)
   Covid-19 Vaccination Clinic  Name:  [name]    MRN: [id number] DOB: May 18, 1946  03/25/2020  [name] was observed post Covid-19 immunization for 15 minutes without incident. She was provided with Vaccine Information Sheet and instruction to access the V-Safe system.   [name] was instructed to call 911 with any severe reactions post vaccine: Marland Kitchen Difficulty breathing  . Swelling of face and throat  . A fast heartbeat  . A bad rash all over body  . Dizziness and weakness

## 2020-04-01 ENCOUNTER — Ambulatory Visit: Payer: PPO | Admitting: Internal Medicine

## 2020-04-22 ENCOUNTER — Encounter: Payer: Self-pay | Admitting: Internal Medicine

## 2020-04-22 ENCOUNTER — Ambulatory Visit (INDEPENDENT_AMBULATORY_CARE_PROVIDER_SITE_OTHER): Payer: PPO | Admitting: Internal Medicine

## 2020-04-22 VITALS — BP 104/60 | HR 63 | Ht 67.0 in | Wt 143.0 lb

## 2020-04-22 DIAGNOSIS — R1319 Other dysphagia: Secondary | ICD-10-CM | POA: Diagnosis not present

## 2020-04-22 DIAGNOSIS — K594 Anal spasm: Secondary | ICD-10-CM

## 2020-04-22 DIAGNOSIS — K582 Mixed irritable bowel syndrome: Secondary | ICD-10-CM | POA: Diagnosis not present

## 2020-04-22 DIAGNOSIS — K638219 Small intestinal bacterial overgrowth, unspecified: Secondary | ICD-10-CM

## 2020-04-22 DIAGNOSIS — K52831 Collagenous colitis: Secondary | ICD-10-CM | POA: Diagnosis not present

## 2020-04-22 DIAGNOSIS — K6389 Other specified diseases of intestine: Secondary | ICD-10-CM

## 2020-04-22 DIAGNOSIS — K219 Gastro-esophageal reflux disease without esophagitis: Secondary | ICD-10-CM

## 2020-04-22 NOTE — Patient Instructions (Signed)
Per Dr Carlean Purl finish your Budesonide and then stop it.   Take your pepcid twice daily, 30 minutes prior to breakfast and supper.   Normal BMI (Body Mass Index- based on height and weight) is between 23 and 30. Your BMI today is Body mass index is 22.4 kg/m. Marland Kitchen Please consider follow up  regarding your BMI with your Primary Care Provider.   Due to recent changes in healthcare laws, you may see the results of your imaging and laboratory studies on MyChart before your provider has had a chance to review them.  We understand that in some cases there may be results that are confusing or concerning to you. Not all laboratory results come back in the same time frame and the provider may be waiting for multiple results in order to interpret others.  Please give [place] 48 hours in order for your provider to thoroughly review all the results before contacting the office for clarification of your results.    You have been given a testing kit to check for small intestine bacterial overgrowth (SIBO) which is completed by a company named [facility]. Make sure to return your test in the mail using the return mailing label given to you along with the kit. Your demographic and insurance information have already been sent to the company and they should be in contact with you over the next week regarding this test. [facility] will collect an upfront charge of $99.74 for commercial insurance plans and $209.74 is you are paying cash. Make sure to discuss with [facility] PRIOR to having the test if they have gotten informatoin from your insurance company as to how much your testing will cost out of pocket, if any. Please keep in mind that you will be getting a call from phone number [phone] or a similar number. If you do not hear from them within this time frame, please call our office at [phone].   We are giving you information to read on collagenous colitis.   You may try over the counter Devrom  for your gas.   I appreciate the opportunity to care for you. [name], MD, [facility]

## 2020-04-22 NOTE — Progress Notes (Signed)
[name] 74 y.o. 08/06/1945 [id number]  Assessment & Plan:   Encounter Diagnoses  Name Primary?  . Collagenous colitis Yes  . Proctalgia fugax   . Irritable bowel syndrome with both constipation and diarrhea   . Small intestinal bacterial overgrowth   . Gastroesophageal reflux disease without esophagitis   . Esophageal dysphagia    It sounds to me that her collagenous colitis is improved.  It has been difficult to sort out what is the primary driving factor in her GI symptoms.  I believe she has IBS mixed pattern and she has had breath test positive for small intestinal bacterial overgrowth which may have responded to treatment in the past.  She gives a pretty classic history for some intermittent proctalgia fugax and is reassured about that.  I have explained to her that she has collagenous colitis not ulcerative colitis as she was under the impression of the latter.   She will finish her budesonide and we will reassess.  I explained there are 3 patterns of treatment and microscopic/collagenous colitis.  Episodic, chronic and 1 and done.  She is one of the first 2.  We would both prefer to use episodic treatment if possible.  Retest for small intestinal bacterial overgrowth with a breath test.  If still positive consider repeat treatment.  Do not swallow as many as 6 pills at a time 1 or 2 at a time  Try over-the-counter Devron to try to alleviate malodorous gas  Take Pepcid twice daily not daily.  No additional work-up of dysphagia that is been chronic for many years with unrevealing EGD status post empiric dilation and unrevealing barium swallow  CC: Marinda Elk, MD   Subjective:   Chief Complaint: Diarrhea  HPI [name] is here for follow-up of several problems.  She has collagenous colitis and has been on a 2-month treatment of budesonide and over time her diarrhea has improved.  Not needing Lomotil anymore.  Now however that she is having less frequent stools  she complains of gas that is frequently malodorous.  She does use Gas-X with slight relief.  In the past she thinks antibiotics have helped when she has had this gaseousness and problems, she did have a positive lactulose hydrogen breath test and was in excess of hydrogen producer in 2019.  She has been treated with several different antibiotics but says they tend to flare her reflux.  She has had chronic recurrent dysphagia type symptoms despite EGD with dilation barium swallow negative.  She has this sometimes and she notices it when she takes a large number of pills at a time.  Takes 6 at a time.  Does drink water prior to that.  Has some vague solid food dysphagia intermittently times years.  This is unchanged.  Is taking her Pepcid daily not twice daily as prescribed.   Wanted to tell me that for years, 5 or 6 times a year she has intense rectal pain that is relieved by going to the toilet and straining as if the stool.   Has a sense of incomplete defecation at times lately.  Plans to travel to [place] for several months in December as she has been doing for a number of years. Allergies  Allergen Reactions  . Latex   . Adhesive [Tape] Rash  . Aspirin Other (See Comments)    Aggravates Acid Reflux  . Prednisone Palpitations    Made pts heart race   Current Meds  Medication Sig  . ALPRAZolam (  XANAX) 0.5 MG tablet Take 1/2 to 1 tablet po BID prn anxiety  . budesonide (ENTOCORT EC) 3 MG 24 hr capsule Take 3 capsules (9 mg total) by mouth in the morning.  . Coenzyme Q10 (COQ10 PO) Take 1 tablet by mouth daily.  Marland Kitchen dicyclomine (BENTYL) 10 MG capsule Take 1 capsule (10 mg total) by mouth 3 (three) times daily between meals as needed for spasms.  Marland Kitchen diltiazem (CARDIZEM) 30 MG tablet Take 1 tablet (30 mg total) by mouth 3 (three) times daily as needed (As needed for atrial fibrillation episodes).  . diphenoxylate-atropine (LOMOTIL) 2.5-0.025 MG tablet Take 1 q am and then every 4 hours prn  .  escitalopram (LEXAPRO) 10 MG tablet Take 1 tablet (10 mg total) by mouth daily.  . famciclovir (FAMVIR) 500 MG tablet Take 1 tablet (500 mg total) by mouth 2 (two) times daily. (Patient taking differently: Take 500 mg by mouth 2 (two) times daily as needed. )  . flecainide (TAMBOCOR) 100 MG tablet Take 1 tablet (100 mg total) by mouth 2 (two) times daily.  Marland Kitchen ibandronate (BONIVA) 150 MG tablet Take 1 tablet (150 mg total) by mouth every 30 (thirty) days.  . metoprolol succinate (TOPROL XL) 25 MG 24 hr tablet Take 1.5 tablets (37.5 mg total) by mouth daily.  . rivaroxaban (XARELTO) 20 MG TABS tablet TAKE 1 TABLET BY MOUTH ONCE A DAY WITH SUPPER.  . rosuvastatin (CRESTOR) 5 MG tablet TAKE 1 TABLET BY MOUTH ONCE DAILY   Past Medical History:  Diagnosis Date  . Arthritis    "fingers; right shoulder"  . Atypical mole 12/10/2012   left sacral (severe)  . Broken ribs   . C. difficile diarrhea   . Cataract   . Clostridium difficile colitis 04/01/2016  . Collagenous colitis   . Coronary Calcium    a. 11/2014 Cardiac CT: Ca2+ score = 50 - all in prox LAD. 69th %'ile for age/sex; b. 05/2015 Ex MV: EF 73%. No infarct/ischemia-->Low risk.  . Diverticulosis   . Esophageal candidiasis ([place])   . GERD (gastroesophageal reflux disease)   . Heart murmur   . Hiatal hernia 2004  . Hyperlipidemia   . IBS (irritable bowel syndrome)   . Internal hemorrhoids 2010   Colon-Dr. Vira Agar   . Kidney stones   . Midsternal chest pain    a. 01/2011 Ex Mv: EF 68%, No ischemia.  . Migraine 01/16/12   "none now for several years"  . Mild Mitral regurgitation    a. 12/2011 Echo: EF 55-60%, no rwma, No evidence of MVP, Mild MR  . Osteoporosis   . PAF (paroxysmal atrial fibrillation) (HCC)    a. diagnosed 12/2011-->s/p DCCV-->flecainide/Xarelto (CHA2DS2VASc = 2).  . Pancreas divisum of native pancreas    probable by CT  . Pterygium eye, bilateral   . Schatzki's ring 2010    EGD- Dr. Vira Agar   . Small intestinal  bacterial overgrowth 06/25/2018  . Squamous cell carcinoma of skin 03/31/2014   left mid pretibial, right mid pretibial sup, right mid pretibial inf  . Squamous cell carcinoma of skin 05/19/2014   right ant pretibial inf  . Squamous cell carcinoma of skin 09/05/2015   left dorsum wrist  . Squamous cell carcinoma of skin 11/10/2015   left ant distal thigh  . Squamous cell carcinoma of skin 11/26/2017   right prox calf  . Squamous cell carcinoma of skin 07/09/2018   left prox lat pretibial  . Squamous cell carcinoma of skin 12/02/2018  left pretibial  . Ulcerative colitis [facility])    Past Surgical History:  Procedure Laterality Date  . APPENDECTOMY  1963  . BREAST CYST ASPIRATION Bilateral    "several; both sides"  . CARDIOVERSION  01/16/2012   Procedure: CARDIOVERSION;  Surgeon: Evans Lance, MD;  Location: [facility];  Service: Cardiovascular;  Laterality: N/A;  . COLONOSCOPY  Multiple  . CYSTOSCOPY W/ STONE MANIPULATION  1980's  . EYE SURGERY     x4  . LEG SURGERY     skin cancer removal  . OVARIAN CYST REMOVAL  1990's  . SHOULDER ARTHROSCOPY W/ ROTATOR CUFF REPAIR Right ~ 2005  . SQUAMOUS CELL CARCINOMA EXCISION     "10-12 removed; all over my body - nose, predominately legs"  . TONSILLECTOMY AND ADENOIDECTOMY  1975  . UPPER GASTROINTESTINAL ENDOSCOPY  Multiple   w/dilation, hiatal hernia  . VAGINAL HYSTERECTOMY  1972   Social History   Social History Narrative   Lives in [place] with husband.   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   Review of Systems See HPI Objective:   Physical Exam BP 104/60   Pulse 63   Ht 5\' 7"   (1.702 m)   Wt 143 lb (64.9 kg)   SpO2 98%   BMI 22.40 kg/m

## 2020-04-29 DIAGNOSIS — K6389 Other specified diseases of intestine: Secondary | ICD-10-CM | POA: Diagnosis not present

## 2020-05-09 DIAGNOSIS — K219 Gastro-esophageal reflux disease without esophagitis: Secondary | ICD-10-CM | POA: Diagnosis not present

## 2020-05-09 DIAGNOSIS — E782 Mixed hyperlipidemia: Secondary | ICD-10-CM | POA: Diagnosis not present

## 2020-05-09 DIAGNOSIS — Z1231 Encounter for screening mammogram for malignant neoplasm of breast: Secondary | ICD-10-CM | POA: Diagnosis not present

## 2020-05-09 DIAGNOSIS — M816 Localized osteoporosis [Lequesne]: Secondary | ICD-10-CM | POA: Diagnosis not present

## 2020-05-09 DIAGNOSIS — F419 Anxiety disorder, unspecified: Secondary | ICD-10-CM | POA: Diagnosis not present

## 2020-05-09 DIAGNOSIS — I482 Chronic atrial fibrillation, unspecified: Secondary | ICD-10-CM | POA: Diagnosis not present

## 2020-05-09 DIAGNOSIS — Z0000 Encounter for general adult medical examination without abnormal findings: Secondary | ICD-10-CM | POA: Diagnosis not present

## 2020-05-09 DIAGNOSIS — K52831 Collagenous colitis: Secondary | ICD-10-CM | POA: Diagnosis not present

## 2020-05-10 ENCOUNTER — Other Ambulatory Visit: Payer: Self-pay | Admitting: Internal Medicine

## 2020-05-10 MED ORDER — NEOMYCIN SULFATE 500 MG PO TABS: 500.0000 mg | ORAL_TABLET | Freq: Two times a day (BID) | ORAL | 0 refills | Status: AC

## 2020-05-25 ENCOUNTER — Ambulatory Visit (INDEPENDENT_AMBULATORY_CARE_PROVIDER_SITE_OTHER): Payer: PPO | Admitting: Dermatology

## 2020-05-25 ENCOUNTER — Other Ambulatory Visit: Payer: Self-pay

## 2020-05-25 ENCOUNTER — Encounter: Payer: Self-pay | Admitting: Dermatology

## 2020-05-25 DIAGNOSIS — D1800 Hemangioma unspecified site: Secondary | ICD-10-CM | POA: Diagnosis not present

## 2020-05-25 DIAGNOSIS — L820 Inflamed seborrheic keratosis: Secondary | ICD-10-CM | POA: Diagnosis not present

## 2020-05-25 DIAGNOSIS — D229 Melanocytic nevi, unspecified: Secondary | ICD-10-CM

## 2020-05-25 DIAGNOSIS — L578 Other skin changes due to chronic exposure to nonionizing radiation: Secondary | ICD-10-CM

## 2020-05-25 DIAGNOSIS — L720 Epidermal cyst: Secondary | ICD-10-CM | POA: Diagnosis not present

## 2020-05-25 DIAGNOSIS — L821 Other seborrheic keratosis: Secondary | ICD-10-CM | POA: Diagnosis not present

## 2020-05-25 DIAGNOSIS — Z85828 Personal history of other malignant neoplasm of skin: Secondary | ICD-10-CM | POA: Diagnosis not present

## 2020-05-25 DIAGNOSIS — Z1283 Encounter for screening for malignant neoplasm of skin: Secondary | ICD-10-CM

## 2020-05-25 DIAGNOSIS — L814 Other melanin hyperpigmentation: Secondary | ICD-10-CM

## 2020-05-25 DIAGNOSIS — L570 Actinic keratosis: Secondary | ICD-10-CM | POA: Diagnosis not present

## 2020-05-25 DIAGNOSIS — D485 Neoplasm of uncertain behavior of skin: Secondary | ICD-10-CM

## 2020-05-25 DIAGNOSIS — C44722 Squamous cell carcinoma of skin of right lower limb, including hip: Secondary | ICD-10-CM

## 2020-05-25 NOTE — Progress Notes (Signed)
Follow-Up Visit   Subjective  [name] is a 74 y.o. female who presents for the following: Annual Exam (Hx SCC - TBSE today) and Other (Spot of right leg x 2-3 weeks). The patient presents for Total-Body Skin Exam (TBSE) for skin cancer screening and mole check.  The following portions of the chart were reviewed this encounter and updated as appropriate:  Tobacco  Allergies  Meds  Problems  Med Hx  Surg Hx  Fam Hx     Review of Systems:  No other skin or systemic complaints except as noted in HPI or Assessment and Plan.  Objective  Well appearing patient in no apparent distress; mood and affect are within normal limits.  A full examination was performed including scalp, head, eyes, ears, nose, lips, neck, chest, axillae, abdomen, back, buttocks, bilateral upper extremities, bilateral lower extremities, hands, feet, fingers, toes, fingernails, and toenails. All findings within normal limits unless otherwise noted below.  Objective  Left ant axillary fold x 1, abdomen x 2, left thigh x 2, right arm x 1 (6): Erythematous keratotic or waxy stuck-on papule or plaque.   Objective  Chest (8): Erythematous thin papules/macules with gritty scale.   Objective  Right Lower Leg - Anterior: 1.0 cm hyperkeratotic papule  Objective  Left Upper Eyelid: Smooth white papule(s).    Assessment & Plan    Lentigines - Scattered tan macules - Discussed due to sun exposure - Benign, observe - Call for any changes  Seborrheic Keratoses - Stuck-on, waxy, tan-brown papules and plaques  - Discussed benign etiology and prognosis. - Observe - Call for any changes  Melanocytic Nevi - Tan-brown and/or pink-flesh-colored symmetric macules and papules - Benign appearing on exam today - Observation - Call clinic for new or changing moles - Recommend daily use of broad spectrum spf 30+ sunscreen to sun-exposed areas.   Hemangiomas - Red papules - Discussed benign nature -  Observe - Call for any changes  Actinic Damage - chronic, secondary to cumulative UV exposure - diffuse scaly erythematous macules with underlying dyspigmentation - Recommend daily broad spectrum sunscreen SPF 30+ to sun-exposed areas, reapply every 2 hours as needed.  - Call for new or changing lesions.  Skin cancer screening performed today.  History of Squamous Cell Carcinoma of the Skin - No evidence of recurrence today - No lymphadenopathy - Recommend regular full body skin exams - Recommend daily broad spectrum sunscreen SPF 30+ to sun-exposed areas, reapply every 2 hours as needed.  - Call if any new or changing lesions are noted between office visits   Inflamed seborrheic keratosis (6) Left ant axillary fold x 1, abdomen x 2, left thigh x 2, right arm x 1  Destruction of lesion - Left ant axillary fold x 1, abdomen x 2, left thigh x 2, right arm x 1 Complexity: simple   Destruction method: cryotherapy   Informed consent: discussed and consent obtained   Timeout:  patient name, date of birth, surgical site, and procedure verified Lesion destroyed using liquid nitrogen: Yes   Region frozen until ice ball extended beyond lesion: Yes   Outcome: patient tolerated procedure well with no complications   Post-procedure details: wound care instructions given    AK (actinic keratosis) (8) Chest  Destruction of lesion - Chest Complexity: simple   Destruction method: cryotherapy   Informed consent: discussed and consent obtained   Timeout:  patient name, date of birth, surgical site, and procedure verified Lesion destroyed using liquid nitrogen: Yes  Region frozen until ice ball extended beyond lesion: Yes   Outcome: patient tolerated procedure well with no complications   Post-procedure details: wound care instructions given    Neoplasm of uncertain behavior of skin Right Lower Leg - Anterior  Epidermal / dermal shaving  Lesion diameter (cm):  1 Informed consent:  discussed and consent obtained   Timeout: patient name, date of birth, surgical site, and procedure verified   Procedure prep:  Patient was prepped and draped in usual sterile fashion Prep type:  Isopropyl alcohol Anesthesia: the lesion was anesthetized in a standard fashion   Anesthetic:  1% lidocaine w/ epinephrine 1-100,000 buffered w/ 8.4% NaHCO3 Instrument used: flexible razor blade   Hemostasis achieved with: pressure, aluminum chloride and electrodesiccation   Outcome: patient tolerated procedure well   Post-procedure details: sterile dressing applied and wound care instructions given   Dressing type: bandage and petrolatum    Destruction of lesion Complexity: extensive   Destruction method: electrodesiccation and curettage   Informed consent: discussed and consent obtained   Timeout:  patient name, date of birth, surgical site, and procedure verified Procedure prep:  Patient was prepped and draped in usual sterile fashion Prep type:  Isopropyl alcohol Anesthesia: the lesion was anesthetized in a standard fashion   Anesthetic:  1% lidocaine w/ epinephrine 1-100,000 buffered w/ 8.4% NaHCO3 Curettage performed in three different directions: Yes   Electrodesiccation performed over the curetted area: Yes   Lesion length (cm):  1 Lesion width (cm):  1 Margin per side (cm):  0.2 Final wound size (cm):  1.4 Hemostasis achieved with:  pressure and aluminum chloride Outcome: patient tolerated procedure well with no complications   Post-procedure details: sterile dressing applied and wound care instructions given   Dressing type: bandage and petrolatum    Specimen 1 - Surgical pathology Differential Diagnosis: SCC vs other  Check Margins: No 1.0 cm hyperkeratotic papule EDC today  [place] Left Upper Eyelid  Benign.  Skin cancer screening  Return in about 6 months (around 11/22/2020).   I, Ashok Cordia, CMA, am acting as scribe for Sarina Ser, MD .  Documentation: I have  reviewed the above documentation for accuracy and completeness, and I agree with the above.  Sarina Ser, MD

## 2020-05-25 NOTE — Patient Instructions (Signed)

## 2020-05-30 ENCOUNTER — Telehealth: Payer: Self-pay

## 2020-05-30 NOTE — Telephone Encounter (Signed)
-----   Message from Ralene Bathe, MD sent at 05/26/2020  5:45 PM EDT ----- Diagnosis Skin , right lower leg-anterior WELL DIFFERENTIATED SQUAMOUS CELL CARCINOMA  Cancer - SCC Already treated Recheck next visit

## 2020-05-30 NOTE — Telephone Encounter (Signed)
Patient informed of pathology results 

## 2020-05-31 DIAGNOSIS — Z961 Presence of intraocular lens: Secondary | ICD-10-CM | POA: Diagnosis not present

## 2020-07-01 ENCOUNTER — Encounter: Payer: Self-pay | Admitting: Family

## 2020-07-01 ENCOUNTER — Ambulatory Visit: Payer: PPO | Admitting: Family

## 2020-07-01 ENCOUNTER — Other Ambulatory Visit: Payer: Self-pay

## 2020-07-01 VITALS — BP 100/60 | HR 57 | Ht 66.0 in | Wt 148.0 lb

## 2020-07-01 DIAGNOSIS — E785 Hyperlipidemia, unspecified: Secondary | ICD-10-CM

## 2020-07-01 DIAGNOSIS — I2510 Atherosclerotic heart disease of native coronary artery without angina pectoris: Secondary | ICD-10-CM

## 2020-07-01 DIAGNOSIS — I2584 Coronary atherosclerosis due to calcified coronary lesion: Secondary | ICD-10-CM | POA: Diagnosis not present

## 2020-07-01 DIAGNOSIS — I340 Nonrheumatic mitral (valve) insufficiency: Secondary | ICD-10-CM

## 2020-07-01 DIAGNOSIS — I480 Paroxysmal atrial fibrillation: Secondary | ICD-10-CM | POA: Diagnosis not present

## 2020-07-01 DIAGNOSIS — Z7901 Long term (current) use of anticoagulants: Secondary | ICD-10-CM | POA: Diagnosis not present

## 2020-07-01 NOTE — Patient Instructions (Addendum)
Medication Instructions:  No medication changes today.   Medication Samples have been provided to the patient.  Drug name: Xarelto        Strength: 20 mg         Qty: 4 bottles LOT: [id number] Exp.Date: 08/23  *If you need a refill on your cardiac medications before your next appointment, please call your pharmacy*  Lab Work: None ordered today.   Testing/Procedures: Your EKG today shows sinus bradycardia.   Follow-Up: At [facility], you and your health needs are our priority.  As part of our continuing mission to provide you with exceptional heart care, we have created designated Provider Care Teams.  These Care Teams include your primary Cardiologist (physician) and Advanced Practice Providers (APPs -  Physician Assistants and Nurse Practitioners) who all work together to provide you with the care you need, when you need it.   Your next appointment:   1 year(s)  The format for your next appointment:   In Person  Provider:   You may see Ida Rogue, MD or one of the following Advanced Practice Providers on your designated Care Team:    Murray Hodgkins, NP  Christell Faith, PA-C  Marrianne Mood, PA-C  Cadence Kathlen Mody, [place]  Laurann Montana, NP  Other Instructions  Exercise recommendations: The American Heart Association recommends 150 minutes of moderate intensity exercise weekly. Try 30 minutes of moderate intensity exercise 4-5 times per week. This could include walking, jogging, or swimming. Your bicycle is great exercise!!  Heart Healthy Diet Recommendations: A low-salt diet is recommended. Meats should be grilled, baked, or boiled. Avoid fried foods. Focus on lean protein sources like fish or chicken with vegetables and fruits. The American Heart Association is a Microbiologist!  American Heart Association Diet and Lifeystyle Recommendations   Assitance with Xarelto if you are paying more than $85 per month for your Xarelto is offered through [employer].  You can register online at [email or web address] or call  888-XARELTO [phone]).

## 2020-07-01 NOTE — Progress Notes (Signed)
Office Visit    Patient Name: [name] Date of Encounter: 07/01/2020  Primary Care Provider:  Marinda Elk, MD Primary Cardiologist:  Ida Rogue, MD Electrophysiologist:  None   Chief Complaint   [name] is a 74 y.o. female with a hx of coronary artery calcification on CT, PAF on chronic anticoagulation, GERD, hyperlipidemia, mild to moderate MR by echo 2020, previous tobacco use presents today for annual follow up of PAF.    Past Medical History    Past Medical History:  Diagnosis Date  . Arthritis    "fingers; right shoulder"  . Atypical mole 12/10/2012   left sacral (severe)  . Broken ribs   . C. difficile diarrhea   . Cataract   . Clostridium difficile colitis 04/01/2016  . Collagenous colitis   . Collagenous colitis   . Coronary Calcium    a. 11/2014 Cardiac CT: Ca2+ score = 50 - all in prox LAD. 69th %'ile for age/sex; b. 05/2015 Ex MV: EF 73%. No infarct/ischemia-->Low risk.  . Diverticulosis   . Esophageal candidiasis ([place])   . GERD (gastroesophageal reflux disease)   . Heart murmur   . Hiatal hernia 2004  . Hyperlipidemia   . IBS (irritable bowel syndrome)   . Internal hemorrhoids 2010   Colon-Dr. Vira Agar   . Kidney stones   . Midsternal chest pain    a. 01/2011 Ex Mv: EF 68%, No ischemia.  . Migraine 01/16/12   "none now for several years"  . Mild Mitral regurgitation    a. 12/2011 Echo: EF 55-60%, no rwma, No evidence of MVP, Mild MR  . Osteoporosis   . PAF (paroxysmal atrial fibrillation) (HCC)    a. diagnosed 12/2011-->s/p DCCV-->flecainide/Xarelto (CHA2DS2VASc = 2).  . Pancreas divisum of native pancreas    probable by CT  . Pterygium eye, bilateral   . Schatzki's ring 2010    EGD- Dr. Vira Agar   . Small intestinal bacterial overgrowth 06/25/2018  . Squamous cell carcinoma of skin 03/31/2014   left mid pretibial, right mid pretibial sup, right mid pretibial inf  . Squamous cell carcinoma of skin 05/19/2014   right ant pretibial  inf  . Squamous cell carcinoma of skin 09/05/2015   left dorsum wrist  . Squamous cell carcinoma of skin 11/10/2015   left ant distal thigh  . Squamous cell carcinoma of skin 11/26/2017   right prox calf  . Squamous cell carcinoma of skin 07/09/2018   left prox lat pretibial  . Squamous cell carcinoma of skin 12/02/2018   left pretibial  . Squamous cell carcinoma of skin 05/25/2020   R lower leg anterior    Past Surgical History:  Procedure Laterality Date  . APPENDECTOMY  1963  . BREAST CYST ASPIRATION Bilateral    "several; both sides"  . CARDIOVERSION  01/16/2012   Procedure: CARDIOVERSION;  Surgeon: Evans Lance, MD;  Location: [facility];  Service: Cardiovascular;  Laterality: N/A;  . COLONOSCOPY  Multiple  . CYSTOSCOPY W/ STONE MANIPULATION  1980's  . EYE SURGERY     x4  . LEG SURGERY     skin cancer removal  . OVARIAN CYST REMOVAL  1990's  . SHOULDER ARTHROSCOPY W/ ROTATOR CUFF REPAIR Right ~ 2005  . SQUAMOUS CELL CARCINOMA EXCISION     "10-12 removed; all over my body - nose, predominately legs"  . TONSILLECTOMY AND ADENOIDECTOMY  1975  . UPPER GASTROINTESTINAL ENDOSCOPY  Multiple   w/dilation, hiatal hernia  . VAGINAL HYSTERECTOMY  1972    Allergies  Allergies  Allergen Reactions  . Latex   . Adhesive [Tape] Rash  . Aspirin Other (See Comments)    Aggravates Acid Reflux  . Prednisone Palpitations    Made pts heart race    History of Present Illness    [name] is a 74 y.o. female with a hx of coronary artery calcification on CT PAF on chronic anticoagulation, GERD, hyperlipidemia, mild to moderate MR by echo 2020, previous tobacco use last seen 05/2019 by Dr. Rockey Situ.  Paroxysmal atrial fibrillation diagnosed in 2013 and treated with cardioversion.  She has been maintained on flecainide as well as metoprolol.  She had a previous coronary calcium score in 2016 of 50 which was noted to all be in the proximal to mid LAD.  This was 69th percentile for age  and sex matched control.  Since that time she has been on Crestor 5 mg daily.  Echocardiogram 04/2019 with LVEF 55 to 60%, RV normal size and function, bilateral atrium normal size, mild mitral valve prolapse, mild to moderate MR.  01/2020 normal liver function with AST 16 and ALT 12. Hb 12.8. Lipid profile 01/28/20 total cholesterol 142, triglyceride 45, HDL 67.7, LDL 65.   She ntoes brief episodes of PAF throughout the last year those these are overall not bothersome and self resolved.  She has not had to take her as needed diltiazem.  She denies chest pain, pressure, tightness.  Reports no shortness of breath at rest no dyspnea on exertion.  She enjoys spending majority of year traveling in her camper and is leaving for [place] next week.  She enjoys exercising on her bicycle.  She denies bleeding complication on her Xarelto.  EKGs/Labs/Other Studies Reviewed:   The following studies were reviewed today:   EKG:  EKG is ordered today.  The ekg ordered today demonstrates sinus bradycardia 57 bpm with no acute ST/T wave changes.  Recent Labs: 07/08/2019: BUN 20; Creatinine, Ser 0.92; Hemoglobin 13.1; Platelets 199.0; Potassium 3.9; Sodium 141  Recent Lipid Panel    Component Value Date/Time   CHOL 161 05/19/2018 0936   CHOL 185 12/22/2014 1008   TRIG 61 05/19/2018 0936   HDL 65 05/19/2018 0936   HDL 72 12/22/2014 1008   CHOLHDL 2.5 05/19/2018 0936   VLDL 12 05/19/2018 0936   LDLCALC 84 05/19/2018 0936   LDLCALC 100 (H) 12/22/2014 1008    Home Medications   Current Meds  Medication Sig  . ALPRAZolam (XANAX) 0.5 MG tablet Take 1/2 to 1 tablet po BID prn anxiety  . budesonide (ENTOCORT EC) 3 MG 24 hr capsule Take 3 capsules (9 mg total) by mouth in the morning.  . Coenzyme Q10 (COQ10 PO) Take 1 tablet by mouth daily.  Marland Kitchen dicyclomine (BENTYL) 10 MG capsule Take 1 capsule (10 mg total) by mouth 3 (three) times daily between meals as needed for spasms.  Marland Kitchen diltiazem (CARDIZEM) 30 MG tablet  Take 1 tablet (30 mg total) by mouth 3 (three) times daily as needed (As needed for atrial fibrillation episodes).  . diphenoxylate-atropine (LOMOTIL) 2.5-0.025 MG tablet Take 1 q am and then every 4 hours prn  . escitalopram (LEXAPRO) 10 MG tablet Take 1 tablet (10 mg total) by mouth daily.  . famciclovir (FAMVIR) 500 MG tablet Take 1 tablet (500 mg total) by mouth 2 (two) times daily. (Patient taking differently: Take 500 mg by mouth 2 (two) times daily as needed.)  . famotidine (PEPCID) 40 MG tablet  Take 1 tablet (40 mg total) by mouth 2 (two) times daily before a meal. Breakfast and supper  . flecainide (TAMBOCOR) 100 MG tablet Take 1 tablet (100 mg total) by mouth 2 (two) times daily.  Marland Kitchen ibandronate (BONIVA) 150 MG tablet Take 1 tablet (150 mg total) by mouth every 30 (thirty) days.  . metoprolol succinate (TOPROL XL) 25 MG 24 hr tablet Take 1.5 tablets (37.5 mg total) by mouth daily.  . nitroGLYCERIN (NITROSTAT) 0.4 MG SL tablet Place 1 tablet (0.4 mg total) under the tongue every 5 (five) minutes as needed for chest pain.  . rivaroxaban (XARELTO) 20 MG TABS tablet TAKE 1 TABLET BY MOUTH ONCE A DAY WITH SUPPER.  . rosuvastatin (CRESTOR) 5 MG tablet TAKE 1 TABLET BY MOUTH ONCE DAILY     Review of Systems   All other systems reviewed and are otherwise negative except as noted above.  Physical Exam    VS:  BP 100/60 (BP Location: Left Arm, Patient Position: Sitting, Cuff Size: Normal)   Pulse (!) 57   Ht 5\' 6"  (1.676 m)   Wt 148 lb (67.1 kg)   SpO2 98%   BMI 23.89 kg/m  , BMI Body mass index is 23.89 kg/m.  Wt Readings from Last 3 Encounters:  07/01/20 148 lb (67.1 kg)  04/22/20 143 lb (64.9 kg)  02/18/20 145 lb 12.8 oz (66.1 kg)    GEN: Well nourished, well developed, in no acute distress. HEENT: normal. Neck: Supple, no JVD, carotid bruits, or masses. Cardiac: RRR, no murmurs, rubs, or gallops. No clubbing, cyanosis, edema.  Radials/DP/PT 2+ and equal bilaterally.   Respiratory:  Respirations regular and unlabored, clear to auscultation bilaterally. GI: Soft, nontender, nondistended. MS: No deformity or atrophy. Skin: Warm and dry, no rash. Neuro:  Strength and sensation are intact. Psych: Normal affect.  Assessment & Plan    1. PAF/anticoagulation-reports only rare episodes of palpitations which are fleeting and self resolved.  Continue flecainide and Toprol at present doses.  Continue as needed diltiazem.  Continue Xarelto 20 mg daily. Creatinine clearance >50.  She did request samples in clinic today and we also gave her information regarding the [facility] select program. 2. GERD-reports symptoms are well controlled.  Continue to follow with primary care provider. 3. HLD-lipid panel earlier this year with LDL 71.  Continue Crestor 5 mg daily. 4. Mild to moderate MR-by echo 04/2019.  No evidence of worsening regurgitation.  Defer repeat echocardiogram at this time.  Continue optimal blood pressure and volume control. 5. Coronary artery calcification on CT -cardiac calcium score of 52,060.  No anginal symptoms.  EKG today without acute.  No indication.  Continue statin, beta-blocker.  No aspirin secondary to chronic anticoagulation.  Disposition: Follow up in 1 year(s) with Dr. Rockey Situ or APP  Signed, Loel Dubonnet, NP 07/01/2020, 10:43 AM [place]

## 2020-07-13 ENCOUNTER — Other Ambulatory Visit: Payer: Self-pay

## 2020-07-13 ENCOUNTER — Ambulatory Visit (INDEPENDENT_AMBULATORY_CARE_PROVIDER_SITE_OTHER): Payer: PPO | Admitting: Dermatology

## 2020-07-13 DIAGNOSIS — C44722 Squamous cell carcinoma of skin of right lower limb, including hip: Secondary | ICD-10-CM | POA: Diagnosis not present

## 2020-07-13 DIAGNOSIS — L570 Actinic keratosis: Secondary | ICD-10-CM

## 2020-07-13 DIAGNOSIS — L820 Inflamed seborrheic keratosis: Secondary | ICD-10-CM | POA: Diagnosis not present

## 2020-07-13 DIAGNOSIS — D485 Neoplasm of uncertain behavior of skin: Secondary | ICD-10-CM

## 2020-07-13 DIAGNOSIS — L03116 Cellulitis of left lower limb: Secondary | ICD-10-CM

## 2020-07-13 HISTORY — DX: Actinic keratosis: L57.0

## 2020-07-13 MED ORDER — MUPIROCIN 2 % EX OINT: 1.0000 "application " | TOPICAL_OINTMENT | Freq: Two times a day (BID) | CUTANEOUS | 1 refills | Status: DC

## 2020-07-13 MED ORDER — DOXYCYCLINE HYCLATE 100 MG PO TABS: 100.0000 mg | ORAL_TABLET | Freq: Two times a day (BID) | ORAL | 0 refills | Status: AC

## 2020-07-13 NOTE — Patient Instructions (Signed)

## 2020-07-13 NOTE — Progress Notes (Signed)
Follow-Up Visit   Subjective  [name] is a 74 y.o. female who presents for the following: Other (Spots of left leg. 2 on her thigh just popped up since last appointment and one on lower leg came up near where skin cancer was treated.).  The following portions of the chart were reviewed this encounter and updated as appropriate:   Tobacco   Allergies   Meds   Problems   Med Hx   Surg Hx   Fam Hx      Review of Systems:  No other skin or systemic complaints except as noted in HPI or Assessment and Plan.  Objective  Well appearing patient in no apparent distress; mood and affect are within normal limits.  A focused examination was performed including legs. Relevant physical exam findings are noted in the Assessment and Plan.  Objective  Right Thigh - Anterior (3): Erythematous keratotic or waxy stuck-on papule or plaque.   Objective  Right Lower Leg - Anterior (10): Erythematous thin papules/macules with gritty scale.   Objective  Right ant thigh: 0.8 cm hyperkeratotic papule  Objective  Right lat thigh: 1.1 cm hyperkeratotic papule  Objective  Left Lower Leg - Anterior: 1.2 x 0.6 cm ulcer   Assessment & Plan  Inflamed seborrheic keratosis (3) Right Thigh - Anterior  Destruction of lesion - Right Thigh - Anterior Complexity: simple   Destruction method: cryotherapy   Informed consent: discussed and consent obtained   Timeout:  patient name, date of birth, surgical site, and procedure verified Lesion destroyed using liquid nitrogen: Yes   Region frozen until ice ball extended beyond lesion: Yes   Outcome: patient tolerated procedure well with no complications   Post-procedure details: wound care instructions given    AK (actinic keratosis) (10) Right Lower Leg - Anterior  Destruction of lesion - Right Lower Leg - Anterior Complexity: simple   Destruction method: cryotherapy   Informed consent: discussed and consent obtained   Timeout:  patient name, date  of birth, surgical site, and procedure verified Lesion destroyed using liquid nitrogen: Yes   Region frozen until ice ball extended beyond lesion: Yes   Outcome: patient tolerated procedure well with no complications   Post-procedure details: wound care instructions given    Neoplasm of uncertain behavior of skin (2) Right ant thigh  Epidermal / dermal shaving  Lesion diameter (cm):  0.8 Informed consent: discussed and consent obtained   Timeout: patient name, date of birth, surgical site, and procedure verified   Procedure prep:  Patient was prepped and draped in usual sterile fashion Prep type:  Isopropyl alcohol Anesthesia: the lesion was anesthetized in a standard fashion   Anesthetic:  1% lidocaine w/ epinephrine 1-100,000 buffered w/ 8.4% NaHCO3 Instrument used: flexible razor blade   Hemostasis achieved with: pressure, aluminum chloride and electrodesiccation   Outcome: patient tolerated procedure well   Post-procedure details: sterile dressing applied and wound care instructions given   Dressing type: bandage and petrolatum    Destruction of lesion Complexity: extensive   Destruction method: electrodesiccation and curettage   Informed consent: discussed and consent obtained   Timeout:  patient name, date of birth, surgical site, and procedure verified Procedure prep:  Patient was prepped and draped in usual sterile fashion Prep type:  Isopropyl alcohol Anesthesia: the lesion was anesthetized in a standard fashion   Anesthetic:  1% lidocaine w/ epinephrine 1-100,000 buffered w/ 8.4% NaHCO3 Curettage performed in three different directions: Yes   Electrodesiccation performed over  the curetted area: Yes   Lesion length (cm):  0.8 Lesion width (cm):  0.8 Margin per side (cm):  0.2 Final wound size (cm):  1.2 Hemostasis achieved with:  pressure and aluminum chloride Outcome: patient tolerated procedure well with no complications   Post-procedure details: sterile dressing  applied and wound care instructions given   Dressing type: bandage and petrolatum    Specimen 1 - Surgical pathology Differential Diagnosis: SCC vs other Check Margins: No 0.8 cm hyperkeratotic papule EDC today  Right lat thigh  Epidermal / dermal shaving  Lesion diameter (cm):  1.1 Informed consent: discussed and consent obtained   Timeout: patient name, date of birth, surgical site, and procedure verified   Procedure prep:  Patient was prepped and draped in usual sterile fashion Prep type:  Isopropyl alcohol Anesthesia: the lesion was anesthetized in a standard fashion   Anesthetic:  1% lidocaine w/ epinephrine 1-100,000 buffered w/ 8.4% NaHCO3 Instrument used: flexible razor blade   Hemostasis achieved with: pressure, aluminum chloride and electrodesiccation   Outcome: patient tolerated procedure well   Post-procedure details: sterile dressing applied and wound care instructions given   Dressing type: bandage and petrolatum    Destruction of lesion Complexity: extensive   Destruction method: electrodesiccation and curettage   Informed consent: discussed and consent obtained   Timeout:  patient name, date of birth, surgical site, and procedure verified Procedure prep:  Patient was prepped and draped in usual sterile fashion Prep type:  Isopropyl alcohol Anesthesia: the lesion was anesthetized in a standard fashion   Anesthetic:  1% lidocaine w/ epinephrine 1-100,000 buffered w/ 8.4% NaHCO3 Curettage performed in three different directions: Yes   Electrodesiccation performed over the curetted area: Yes   Lesion length (cm):  1.1 Lesion width (cm):  1.1 Margin per side (cm):  0.2 Final wound size (cm):  1.5 Hemostasis achieved with:  pressure and aluminum chloride Outcome: patient tolerated procedure well with no complications   Post-procedure details: sterile dressing applied and wound care instructions given   Dressing type: bandage and petrolatum    Specimen 2 -  Surgical pathology Differential Diagnosis: SCC vs other  Check Margins: No 1.1 cm hyperkeratotic papule EDC today  Cellulitis of left lower extremity in site of recent trauma Left Lower Leg - Anterior Area cleansed with Purycin. Mupirocin oint applied followed by telfa and coban.  mupirocin ointment (BACTROBAN) 2 % - Left Lower Leg - Anterior  doxycycline (VIBRA-TABS) 100 MG tablet - Left Lower Leg - Anterior  Return for Follow up as scheduled.   I, Ashok Cordia, CMA, am acting as scribe for Sarina Ser, MD .  Documentation: I have reviewed the above documentation for accuracy and completeness, and I agree with the above.  Sarina Ser, MD

## 2020-07-18 ENCOUNTER — Other Ambulatory Visit: Payer: Self-pay | Admitting: Cardiovascular Disease

## 2020-07-18 NOTE — Telephone Encounter (Signed)
Rx request sent to pharmacy.  

## 2020-07-19 ENCOUNTER — Telehealth: Payer: Self-pay

## 2020-07-19 NOTE — Telephone Encounter (Signed)
Left message on voicemail to return my call.  

## 2020-07-19 NOTE — Telephone Encounter (Signed)
-----   Message from Deirdre Evener, MD sent at 07/18/2020  3:19 PM EST ----- Diagnosis 1. Skin , right ant thigh WELL DIFFERENTIATED SQUAMOUS CELL CARCINOMA WITH SUPERFICIAL INFILTRATION 2. Skin , right lat thigh HYPERTROPHIC ACTINIC KERATOSIS  1- Cancer - SCC Already treated 2- PreCancer Already treated  Recheck at next visit

## 2020-07-19 NOTE — Telephone Encounter (Signed)
-----   Message from David C Kowalski, MD sent at 07/18/2020  3:19 PM EST ----- Diagnosis 1. Skin , right ant thigh WELL DIFFERENTIATED SQUAMOUS CELL CARCINOMA WITH SUPERFICIAL INFILTRATION 2. Skin , right lat thigh HYPERTROPHIC ACTINIC KERATOSIS  1- Cancer - SCC Already treated 2- PreCancer Already treated  Recheck at next visit 

## 2020-07-19 NOTE — Telephone Encounter (Signed)
Patient informed of pathology results 

## 2020-07-21 ENCOUNTER — Encounter: Payer: Self-pay | Admitting: Dermatology

## 2020-08-12 DIAGNOSIS — L03119 Cellulitis of unspecified part of limb: Secondary | ICD-10-CM | POA: Diagnosis not present

## 2020-08-12 DIAGNOSIS — Z6822 Body mass index (BMI) 22.0-22.9, adult: Secondary | ICD-10-CM | POA: Diagnosis not present

## 2020-09-07 DIAGNOSIS — N6313 Unspecified lump in the right breast, lower outer quadrant: Secondary | ICD-10-CM | POA: Diagnosis not present

## 2020-09-07 DIAGNOSIS — N6041 Mammary duct ectasia of right breast: Secondary | ICD-10-CM | POA: Diagnosis not present

## 2020-09-07 DIAGNOSIS — N6001 Solitary cyst of right breast: Secondary | ICD-10-CM | POA: Diagnosis not present

## 2020-09-20 HISTORY — PX: BREAST BIOPSY: SHX20

## 2020-10-03 DIAGNOSIS — Z7901 Long term (current) use of anticoagulants: Secondary | ICD-10-CM | POA: Diagnosis not present

## 2020-10-03 DIAGNOSIS — S81812A Laceration without foreign body, left lower leg, initial encounter: Secondary | ICD-10-CM | POA: Diagnosis not present

## 2020-10-03 DIAGNOSIS — Z859 Personal history of malignant neoplasm, unspecified: Secondary | ICD-10-CM | POA: Diagnosis not present

## 2020-10-05 DIAGNOSIS — Z6822 Body mass index (BMI) 22.0-22.9, adult: Secondary | ICD-10-CM | POA: Diagnosis not present

## 2020-10-05 DIAGNOSIS — S81812A Laceration without foreign body, left lower leg, initial encounter: Secondary | ICD-10-CM | POA: Diagnosis not present

## 2020-10-12 DIAGNOSIS — Z6822 Body mass index (BMI) 22.0-22.9, adult: Secondary | ICD-10-CM | POA: Diagnosis not present

## 2020-10-12 DIAGNOSIS — L03116 Cellulitis of left lower limb: Secondary | ICD-10-CM | POA: Diagnosis not present

## 2020-10-27 ENCOUNTER — Ambulatory Visit: Payer: PPO | Admitting: Dermatology

## 2020-11-02 ENCOUNTER — Encounter: Payer: PPO | Attending: Internal Medicine | Admitting: Internal Medicine

## 2020-11-02 ENCOUNTER — Other Ambulatory Visit: Payer: Self-pay

## 2020-11-02 DIAGNOSIS — Z85828 Personal history of other malignant neoplasm of skin: Secondary | ICD-10-CM | POA: Diagnosis not present

## 2020-11-02 DIAGNOSIS — Y9389 Activity, other specified: Secondary | ICD-10-CM | POA: Insufficient documentation

## 2020-11-02 DIAGNOSIS — L97828 Non-pressure chronic ulcer of other part of left lower leg with other specified severity: Secondary | ICD-10-CM | POA: Diagnosis not present

## 2020-11-02 DIAGNOSIS — S8012XD Contusion of left lower leg, subsequent encounter: Secondary | ICD-10-CM | POA: Diagnosis not present

## 2020-11-02 DIAGNOSIS — W19XXXD Unspecified fall, subsequent encounter: Secondary | ICD-10-CM | POA: Insufficient documentation

## 2020-11-02 NOTE — Progress Notes (Addendum)
[name], [name] ([id number]) Visit Report for 11/02/2020 Allergy List Details Patient Name: [name], [name]. Date of Service: 11/02/2020 2:30 PM Medical Record Number: [id number] Patient Account Number: [id number] Date of Birth/Sex: 05/24/1946 (75 y.o. F) Treating RN: Donnamarie Poag Primary Care : Harold Barban Other Clinician: Jeanine Luz Referring : Referral, Self Treating /Extender: Ricard Dillon Weeks in Treatment: 0 Allergies Active Allergies aspirin latex prednisone adhesive tape Reaction: irritation/thin skin Severity: Mild Allergy Notes Electronic Signature(s) Signed: 11/03/2020 7:52:35 AM By: Donnamarie Poag Previous Signature: 11/02/2020 8:46:40 AM Version By: Donnamarie Poag Entered By: Donnamarie Poag on 11/02/2020 14:46:28 [name] ([id number]) -------------------------------------------------------------------------------- Arrival Information Details Patient Name: [name] Date of Service: 11/02/2020 2:30 PM Medical Record Number: [id number] Patient Account Number: [id number] Date of Birth/Sex: 01-31-1946 (75 y.o. F) Treating RN: Donnamarie Poag Primary Care : Harold Barban Other Clinician: Jeanine Luz Referring : Referral, Self Treating /Extender: Tito Dine in Treatment: 0 Visit Information Patient Arrived: Ambulatory Arrival Time: 14:30 Accompanied By: husband Transfer Assistance: None Patient Identification Verified: Yes Secondary Verification Process Completed: Yes Patient Has Alerts: Yes Patient Alerts: Patient on Blood Thinner NOT diabetic XARELTO Electronic Signature(s) Signed: 11/03/2020 7:52:35 AM By: Donnamarie Poag Entered By: Donnamarie Poag on 11/02/2020 14:36:19 [name] ([id number]) -------------------------------------------------------------------------------- Clinic Level of Care Assessment Details Patient Name: [name] Date of Service: 11/02/2020 2:30  PM Medical Record Number: [id number] Patient Account Number: [id number] Date of Birth/Sex: July 29, 1945 (75 y.o. F) Treating RN: Dolan Amen Primary Care : Harold Barban Other Clinician: Jeanine Luz Referring : Referral, Self Treating /Extender: Tito Dine in Treatment: 0 Clinic Level of Care Assessment Items TOOL 1 Quantity Score X - Use when EandM and Procedure is performed on INITIAL visit 1 0 ASSESSMENTS - Nursing Assessment / Reassessment X - General Physical Exam (combine w/ comprehensive assessment (listed just below) when performed on new 1 20 pt. evals) X- 1 25 Comprehensive Assessment (HX, ROS, Risk Assessments, Wounds Hx, etc.) ASSESSMENTS - Wound and Skin Assessment / Reassessment []  - Dermatologic / Skin Assessment (not related to wound area) 0 ASSESSMENTS - Ostomy and/or Continence Assessment and Care []  - Incontinence Assessment and Management 0 []  - 0 Ostomy Care Assessment and Management (repouching, etc.) PROCESS - Coordination of Care X - Simple Patient / Family Education for ongoing care 1 15 []  - 0 Complex (extensive) Patient / Family Education for ongoing care X- 1 10 Staff obtains Programmer, systems, Records, Test Results / Process Orders []  - 0 Staff telephones HHA, Nursing Homes / Clarify orders / etc []  - 0 Routine Transfer to another Facility (non-emergent condition) []  - 0 Routine Hospital Admission (non-emergent condition) []  - 0 New Admissions / Biomedical engineer / Ordering NPWT, Apligraf, etc. []  - 0 Emergency Hospital Admission (emergent condition) PROCESS - Special Needs []  - Pediatric / Minor Patient Management 0 []  - 0 Isolation Patient Management []  - 0 Hearing / Language / Visual special needs []  - 0 Assessment of Community assistance (transportation, D/C planning, etc.) []  - 0 Additional assistance / Altered mentation []  - 0 Support Surface(s) Assessment (bed, cushion, seat,  etc.) INTERVENTIONS - Miscellaneous []  - External ear exam 0 []  - 0 Patient Transfer (multiple staff / Civil Service fast streamer / Similar devices) []  - 0 Simple Staple / Suture removal (25 or less) []  - 0 Complex Staple / Suture removal (26 or more) []  - 0 Hypo/Hyperglycemic Management (do not check if billed separately) X- 1 15 Ankle / Brachial Index (ABI) -  do not check if billed separately Has the patient been seen at the hospital within the last three years: Yes Total Score: 85 Level Of Care: New/Established - Level 3 [name], [name] ([id number]) Electronic Signature(s) Signed: 11/02/2020 4:47:42 PM By: Georges Mouse, Minus Breeding RN Entered By: Georges Mouse, Minus Breeding on 11/02/2020 15:26:48 [name] ([id number]) -------------------------------------------------------------------------------- Compression Therapy Details Patient Name: [name] Date of Service: 11/02/2020 2:30 PM Medical Record Number: [id number] Patient Account Number: [id number] Date of Birth/Sex: Apr 23, 1946 (75 y.o. F) Treating RN: Dolan Amen Primary Care : Harold Barban Other Clinician: Jeanine Luz Referring : Referral, Self Treating /Extender: Ricard Dillon Weeks in Treatment: 0 Compression Therapy Performed for Wound Assessment: Wound #1 Left,Midline Lower Leg Performed By: Clinician Dolan Amen, RN Compression Type: Three Layer Pre Treatment ABI: 1.2 Post Procedure Diagnosis Same as Pre-procedure Electronic Signature(s) Signed: 11/02/2020 4:47:42 PM By: Georges Mouse, Minus Breeding RN Entered By: Georges Mouse, Kenia on 11/02/2020 15:24:48 [name] ([id number]) -------------------------------------------------------------------------------- Encounter Discharge Information Details Patient Name: [name] Date of Service: 11/02/2020 2:30 PM Medical Record Number: [id number] Patient Account Number: [id number] Date of Birth/Sex: 1946/01/04 (75 y.o. F) Treating  RN: Donnamarie Poag Primary Care : Harold Barban Other Clinician: Jeanine Luz Referring : Referral, Self Treating /Extender: Tito Dine in Treatment: 0 Encounter Discharge Information Items Post Procedure Vitals Discharge Condition: Stable Temperature (F): 97.8 Ambulatory Status: Wheelchair Pulse (bpm): 61 Discharge Destination: Home Respiratory Rate (breaths/min): 16 Transportation: Private Auto Blood Pressure (mmHg): 107/65 Accompanied By: husband Schedule Follow-up Appointment: Yes Clinical Summary of Care: Electronic Signature(s) Signed: 11/03/2020 7:52:35 AM By: Donnamarie Poag Entered By: Donnamarie Poag on 11/02/2020 15:46:34 [name] ([id number]) -------------------------------------------------------------------------------- Lower Extremity Assessment Details Patient Name: [name] Date of Service: 11/02/2020 2:30 PM Medical Record Number: [id number] Patient Account Number: [id number] Date of Birth/Sex: [birth year] (75 y.o. F) Treating RN: Donnamarie Poag Primary Care : Harold Barban Other Clinician: Jeanine Luz Referring : Referral, Self Treating /Extender: Tito Dine in Treatment: 0 Edema Assessment Assessed: [Left: Yes] [Right: No] Edema: [Left: N] [Right: o] Calf Left: Right: Point of Measurement: 22 cm From Medial Instep 35 cm Ankle Left: Right: Point of Measurement: 11 cm From Medial Instep 21 cm Knee To Floor Left: Right: From Medial Instep 38 cm Vascular Assessment Pulses: Dorsalis Pedis Palpable: [Left:Yes] Blood Pressure: Brachial: [Left:160] Dorsalis Pedis: 160 Ankle: Posterior Tibial: 190 Ankle Brachial Index: [Left:1.19] Electronic Signature(s) Signed: 11/03/2020 7:52:35 AM By: Donnamarie Poag Entered By: Donnamarie Poag on 11/02/2020 15:04:40 [name] ([id number]) -------------------------------------------------------------------------------- Multi Wound  Chart Details Patient Name: [name] Date of Service: 11/02/2020 2:30 PM Medical Record Number: [id number] Patient Account Number: [id number] Date of Birth/Sex: 1946-05-21 (75 y.o. F) Treating RN: Dolan Amen Primary Care : Harold Barban Other Clinician: Jeanine Luz Referring : Referral, Self Treating /Extender: Tito Dine in Treatment: 0 Vital Signs Height(in): 68 Pulse(bpm): 61 Weight(lbs): 149 Blood Pressure(mmHg): 107/65 Body Mass Index(BMI): 23 Temperature(F): 98.2 Respiratory Rate(breaths/min): 16 Photos: [N/A:N/A] Wound Location: Left, Midline Lower Leg N/A N/A Wounding Event: Trauma N/A N/A Primary Etiology: Trauma, Other N/A N/A Comorbid History: Cataracts, Arrhythmia, N/A N/A Hypertension, Colitis, Received Chemotherapy Date Acquired: 09/26/2020 N/A N/A Weeks of Treatment: 0 N/A N/A Wound Status: Open N/A N/A Measurements L x W x D (cm) 3x3.5x0.2 N/A N/A Area (cm) : 8.247 N/A N/A Volume (cm) : 1.649 N/A N/A % Reduction in Area: 0.00% N/A N/A % Reduction in Volume: 0.00% N/A N/A Starting Position 1 (  o'clock): 11 Ending Position 1 (o'clock): 1 Maximum Distance 1 (cm): 2.5 Undermining: Yes N/A N/A Classification: Full Thickness Without Exposed N/A N/A Support Structures Exudate Amount: Large N/A N/A Exudate Type: Sanguinous N/A N/A Exudate Color: red N/A N/A Granulation Amount: Large (67-100%) N/A N/A Granulation Quality: Red N/A N/A Necrotic Amount: None Present (0%) N/A N/A Exposed Structures: Fat Layer (Subcutaneous Tissue): N/A N/A Yes Fascia: No Tendon: No Muscle: No Joint: No Bone: No Epithelialization: None N/A N/A Debridement: Debridement - Selective/Open N/A N/A Wound Pre-procedure Verification/Time 15:20 N/A N/A Out Taken: Pain Control: Lidocaine 4% Topical Solution N/A N/A Tissue Debrided: Blood Clots N/A N/A Level: Non-Viable Tissue N/A N/A Debridement Area (sq cm): 10.5 N/A  N/A Instrument: Forceps, Other(scoop curette) N/A N/A [name], [name] ([id number]) Bleeding: Moderate N/A N/A Hemostasis Achieved: Pressure N/A N/A Debridement Treatment Procedure was tolerated well N/A N/A Response: Post Debridement 1.8x1.2x0.6 N/A N/A Measurements L x W x D (cm) Post Debridement Volume: 1.018 N/A N/A (cm) Procedures Performed: Compression Therapy N/A N/A Debridement Treatment Notes Electronic Signature(s) Signed: 11/02/2020 5:03:12 PM By: Linton Ham MD Entered By: Linton Ham on 11/02/2020 15:34:22 [name] ([id number]) -------------------------------------------------------------------------------- [place] Details Patient Name: [name] Date of Service: 11/02/2020 2:30 PM Medical Record Number: [id number] Patient Account Number: [id number] Date of Birth/Sex: Dec 04, 1945 (75 y.o. F) Treating RN: Dolan Amen Primary Care : Harold Barban Other Clinician: Jeanine Luz Referring : Referral, Self Treating /Extender: Tito Dine in Treatment: 0 Active Inactive Necrotic Tissue Nursing Diagnoses: Impaired tissue integrity related to necrotic/devitalized tissue Goals: Necrotic/devitalized tissue will be minimized in the wound bed Date Initiated: 11/02/2020 Target Resolution Date: 11/02/2020 Goal Status: Active Patient/caregiver will verbalize understanding of reason and process for debridement of necrotic tissue Date Initiated: 11/02/2020 Target Resolution Date: 11/02/2020 Goal Status: Active Interventions: Assess patient pain level pre-, during and post procedure and prior to discharge Provide education on necrotic tissue and debridement process Treatment Activities: Apply topical anesthetic as ordered : 11/02/2020 Excisional debridement : 11/02/2020 Notes: Orientation to the Wound Care Program Nursing Diagnoses: Knowledge deficit related to the wound healing center  program Goals: Patient/caregiver will verbalize understanding of the [facility] Date Initiated: 11/02/2020 Target Resolution Date: 11/02/2020 Goal Status: Active Interventions: Provide education on orientation to the wound center Notes: Wound/Skin Impairment Nursing Diagnoses: Impaired tissue integrity Goals: Patient/caregiver will verbalize understanding of skin care regimen Date Initiated: 11/02/2020 Target Resolution Date: 11/02/2020 Goal Status: Active Ulcer/skin breakdown will have a volume reduction of 30% by week 4 Date Initiated: 11/02/2020 Target Resolution Date: 12/02/2020 Goal Status: Active Ulcer/skin breakdown will have a volume reduction of 50% by week 8 Date Initiated: 11/02/2020 Target Resolution Date: 01/02/2021 Goal Status: Active Ulcer/skin breakdown will have a volume reduction of 80% by week 12 [name], [name] ([id number]) Date Initiated: 11/02/2020 Target Resolution Date: 02/01/2021 Goal Status: Active Ulcer/skin breakdown will heal within 14 weeks Date Initiated: 11/02/2020 Target Resolution Date: 03/04/2021 Goal Status: Active Interventions: Assess patient/caregiver ability to obtain necessary supplies Assess patient/caregiver ability to perform ulcer/skin care regimen upon admission and as needed Assess ulceration(s) every visit Provide education on ulcer and skin care Treatment Activities: Referred to DME  for dressing supplies : 11/02/2020 Skin care regimen initiated : 11/02/2020 Topical wound management initiated : 11/02/2020 Notes: Electronic Signature(s) Signed: 11/02/2020 4:47:42 PM By: Georges Mouse, Minus Breeding RN Entered By: Georges Mouse, Minus Breeding on 11/02/2020 15:20:57 [name] ([id number]) -------------------------------------------------------------------------------- Pain Assessment Details Patient Name: [name] Date of  Service: 11/02/2020 2:30 PM Medical Record Number: [id number] Patient Account Number:  [id number] Date of Birth/Sex: January 01, 1946 (75 y.o. F) Treating RN: Donnamarie Poag Primary Care : Harold Barban Other Clinician: Jeanine Luz Referring : Referral, Self Treating /Extender: Tito Dine in Treatment: 0 Active Problems Location of Pain Severity and Description of Pain Patient Has Paino Yes Site Locations Rate the pain. Current Pain Level: 4 Pain Management and Medication Current Pain Management: Electronic Signature(s) Signed: 11/03/2020 7:52:35 AM By: Donnamarie Poag Entered By: Donnamarie Poag on 11/02/2020 14:36:35 [name] ([id number]) -------------------------------------------------------------------------------- Patient/Caregiver Education Details Patient Name: [name] Date of Service: 11/02/2020 2:30 PM Medical Record Number: [id number] Patient Account Number: [id number] Date of Birth/Gender: November 17, 1945 (75 y.o. F) Treating RN: Dolan Amen Primary Care Physician: Harold Barban Other Clinician: Jeanine Luz Referring Physician: Referral, Self Treating Physician/Extender: Tito Dine in Treatment: 0 Education Assessment Education Provided To: Patient Education Topics Provided Wound Debridement: Methods: Explain/Verbal Responses: State content correctly Wound/Skin Impairment: Methods: Explain/Verbal Responses: State content correctly Notes Compression education ,debridement education Electronic Signature(s) Signed: 11/02/2020 4:47:42 PM By: Georges Mouse, Minus Breeding RN Entered By: Georges Mouse, Kenia on 11/02/2020 15:27:34 [name] ([id number]) -------------------------------------------------------------------------------- Wound Assessment Details Patient Name: [name] Date of Service: 11/02/2020 2:30 PM Medical Record Number: [id number] Patient Account Number: [id number] Date of Birth/Sex: [birth year] (75 y.o. F) Treating RN: Donnamarie Poag Primary Care : Harold Barban Other Clinician: Jeanine Luz Referring : Referral, Self Treating /Extender: Tito Dine in Treatment: 0 Wound Status Wound Number: 1 Primary Trauma, Other Etiology: Wound Location: Left, Midline Lower Leg Wound Status: Open Wounding Event: Trauma Comorbid Cataracts, Arrhythmia, Hypertension, Colitis, Received Date Acquired: 09/26/2020 History: Chemotherapy Weeks Of Treatment: 0 Clustered Wound: No Photos Wound Measurements Length: (cm) 3 Width: (cm) 3.5 Depth: (cm) 0.2 Area: (cm) 8.247 Volume: (cm) 1.649 % Reduction in Area: 0% % Reduction in Volume: 0% Epithelialization: None Tunneling: No Undermining: Yes Starting Position (o'clock): 11 Ending Position (o'clock): 1 Maximum Distance: (cm) 2.5 Wound Description Classification: Full Thickness Without Exposed Support Structures Exudate Amount: Large Exudate Type: Sanguinous Exudate Color: red Foul Odor After Cleansing: No Slough/Fibrino Yes Wound Bed Granulation Amount: Large (67-100%) Exposed Structure Granulation Quality: Red Fascia Exposed: No Necrotic Amount: None Present (0%) Fat Layer (Subcutaneous Tissue) Exposed: Yes Tendon Exposed: No Muscle Exposed: No Joint Exposed: No Bone Exposed: No Treatment Notes Wound #1 (Lower Leg) Wound Laterality: Left, Midline Cleanser Soap and Water [name], [name] ([id number]) Discharge Instruction: Gently cleanse wound with antibacterial soap, rinse and pat dry prior to dressing wounds Peri-Wound Care Topical Primary Dressing Silvercel Small 2x2 (in/in) Discharge Instruction: Apply Silvercel Small 2x2 (in/in) as instructed, tuck in undermining Secondary Dressing Xtrasorb Medium 4x5 (in/in) Discharge Instruction: Apply to wound as directed. Do not cut. Secured With Compression Wrap Profore Lite LF 3 Multilayer Compression Bandaging System Discharge Instruction: Apply 3 multi-layer wrap as prescribed. Compression  Stockings Add-Ons Electronic Signature(s) Signed: 11/02/2020 4:47:42 PM By: Georges Mouse, Minus Breeding RN Signed: 11/03/2020 7:52:35 AM By: Donnamarie Poag Entered By: Georges Mouse, Minus Breeding on 11/02/2020 15:30:28 [name] ([id number]) -------------------------------------------------------------------------------- Vitals Details Patient Name: [name] Date of Service: 11/02/2020 2:30 PM Medical Record Number: [id number] Patient Account Number: [id number] Date of Birth/Sex: 1945-12-30 (75 y.o. F) Treating RN: Donnamarie Poag Primary Care : Harold Barban Other Clinician: Jeanine Luz Referring : Referral, Self Treating /Extender: Tito Dine in Treatment: 0 Vital Signs Time Taken: 14:39 Temperature (F): 98.2 Height (in): 68  Pulse (bpm): 61 Source: Stated Respiratory Rate (breaths/min): 16 Weight (lbs): 149 Blood Pressure (mmHg): 107/65 Source: Measured Reference Range: 80 - 120 mg / dl Body Mass Index (BMI): 22.7 Electronic Signature(s) Signed: 11/03/2020 7:52:35 AM By: Donnamarie Poag Entered ByDonnamarie Poag on 11/02/2020 14:38:45

## 2020-11-03 NOTE — Progress Notes (Signed)
[name] ([id number]) Visit Report for 11/02/2020 Debridement Details Patient Name: [name], [name]. Date of Service: 11/02/2020 2:30 PM Medical Record Number: [id number] Patient Account Number: [id number] Date of Birth/Sex: 1945/12/12 (75 y.o. F) Treating RN: Dolan Amen Primary Care Provider: Harold Barban Other Clinician: Jeanine Luz Referring Provider: Referral, Self Treating Provider/Extender: Tito Dine in Treatment: 0 Debridement Performed for Wound #1 Left,Midline Lower Leg Assessment: Performed By: Physician Ricard Dillon, MD Debridement Type: Debridement Level of Consciousness (Pre- Awake and Alert procedure): Pre-procedure Verification/Time Out Yes - 15:20 Taken: Start Time: 15:20 Pain Control: Lidocaine 4% Topical Solution Total Area Debrided (L x W): 3 (cm) x 3.5 (cm) = 10.5 (cm) Tissue and other material Non-Viable, Blood Clots, Subcutaneous, Fibrin/Exudate debrided: Level: Skin/Subcutaneous Tissue Debridement Description: Excisional Instrument: Forceps, Other : scoop curette Bleeding: Moderate Hemostasis Achieved: Pressure Response to Treatment: Procedure was tolerated well Level of Consciousness (Post- Awake and Alert procedure): Post Debridement Measurements of Total Wound Length: (cm) 1.8 Width: (cm) 1.2 Depth: (cm) 0.6 Volume: (cm) 1.018 Character of Wound/Ulcer Post Debridement: Stable Post Procedure Diagnosis Same as Pre-procedure Electronic Signature(s) Signed: 11/02/2020 4:47:42 PM By: Charlett Nose RN Signed: 11/02/2020 5:03:12 PM By: Linton Ham MD Entered By: Linton Ham on 11/02/2020 15:34:37 [name] ([id number]) -------------------------------------------------------------------------------- HPI Details Patient Name: [name] Date of Service: 11/02/2020 2:30 PM Medical Record Number: [id number] Patient Account Number: [id number] Date of Birth/Sex: 12/01/1973 (75 y.o.  F) Treating RN: Dolan Amen Primary Care Provider: Harold Barban Other Clinician: Jeanine Luz Referring Provider: Referral, Self Treating Provider/Extender: Tito Dine in Treatment: 0 History of Present Illness HPI Description: ADMISSION 11/02/2020 This is a 75 year old woman who spent 3 months in [place] just returning yesterday. On March 7 she was picking oranges for the need he had a fall and developed a skin tear on her left lower leg she is also able to show me pictures. She was seen in ER or urgent care. They tried to replace the skin with Steri-Strips. She suddenly developed swelling in the area. She went back to the urgent care. She has been using Bactroban and Steri-Strips to try and reapplied approximate things ever since. She does have atrial fibrillation on anticoagulants. Past medical history includes paroxysmal atrial fibrillation, multiple skin problems followed by Dr. Nehemiah Massed at [facility] including seborrheic keratosis melanotic nevi hemangiomas and actinic damage secondary to chronic UV exposure. She also has a history of squamous cell carcinoma of the skin and has been treated with radiation to her legs at one point as well as I think phototherapy. She also has hyperlipidemia. ABI in our clinic was 1.19 on the left Electronic Signature(s) Signed: 11/02/2020 5:03:12 PM By: Linton Ham MD Entered By: Linton Ham on 11/02/2020 15:37:42 [name] ([id number]) -------------------------------------------------------------------------------- Physical Exam Details Patient Name: [name] Date of Service: 11/02/2020 2:30 PM Medical Record Number: [id number] Patient Account Number: [id number] Date of Birth/Sex: December 24, 1945 (75 y.o. F) Treating RN: Dolan Amen Primary Care Provider: Harold Barban Other Clinician: Jeanine Luz Referring Provider: Referral, Self Treating Provider/Extender: Ricard Dillon Weeks in  Treatment: 0 Constitutional Sitting or standing Blood Pressure is within target range for patient.. Pulse regular and within target range for patient.Marland Kitchen Respirations regular, non- labored and within target range.. Temperature is normal and within the target range for the patient.Marland Kitchen appears in no distress. Cardiovascular Pedal pulses were normal on the left. Integumentary (Hair, Skin) Skin changes of chronic actinic skin damage. Notes Wound exam; the  area in question is on the left anterior mid tibia. The skin over the top of this was necrotic and black. This was removed. This uncovered a however no retained hematoma. We remove this with an open curette unfortunately there is a extending area from about 12-1 o'clock at about 2-1/2 cm. No evidence of infection. The tissue here looks healthy Electronic Signature(s) Signed: 11/02/2020 5:03:12 PM By: Linton Ham MD Entered By: Linton Ham on 11/02/2020 15:41:48 [name] ([id number]) -------------------------------------------------------------------------------- Physician Orders Details Patient Name: [name] Date of Service: 11/02/2020 2:30 PM Medical Record Number: [id number] Patient Account Number: [id number] Date of Birth/Sex: [birth year] (75 y.o. F) Treating RN: Dolan Amen Primary Care Provider: Harold Barban Other Clinician: Jeanine Luz Referring Provider: Referral, Self Treating Provider/Extender: Tito Dine in Treatment: 0 Verbal / Phone Orders: No Diagnosis Coding Follow-up Appointments o Return Appointment in 1 week. Bathing/ Shower/ Hygiene o May shower with wound dressing protected with water repellent cover or cast protector. Edema Control - Lymphedema / Segmental Compressive Device / Other Left Lower Extremity o Optional: One layer of unna paste to top of compression wrap (to act as an anchor). o 3 Layer Compression System for Lymphedema. Wound Treatment Wound #1 - Lower  Leg Wound Laterality: Left, Midline Cleanser: Soap and Water 1 x Per Week/30 Days Discharge Instructions: Gently cleanse wound with antibacterial soap, rinse and pat dry prior to dressing wounds Primary Dressing: Silvercel Small 2x2 (in/in) 1 x Per Week/30 Days Discharge Instructions: Apply Silvercel Small 2x2 (in/in) as instructed, tuck in undermining Secondary Dressing: Xtrasorb Medium 4x5 (in/in) 1 x Per Week/30 Days Discharge Instructions: Apply to wound as directed. Do not cut. Compression Wrap: Profore Lite LF 3 Multilayer Compression Bandaging System 1 x Per Week/30 Days Discharge Instructions: Apply 3 multi-layer wrap as prescribed. Electronic Signature(s) Signed: 11/02/2020 4:47:42 PM By: Georges Mouse, Minus Breeding RN Signed: 11/02/2020 5:03:12 PM By: Linton Ham MD Entered By: Georges Mouse, Minus Breeding on 11/02/2020 15:26:19 [name] ([id number]) -------------------------------------------------------------------------------- Problem List Details Patient Name: [name] Date of Service: 11/02/2020 2:30 PM Medical Record Number: [id number] Patient Account Number: [id number] Date of Birth/Sex: [birth year] (75 y.o. F) Treating RN: Dolan Amen Primary Care Provider: Harold Barban Other Clinician: Jeanine Luz Referring Provider: Referral, Self Treating Provider/Extender: Tito Dine in Treatment: 0 Active Problems ICD-10 Encounter Code Description Active Date MDM Diagnosis S80.12XD Contusion of left lower leg, subsequent encounter 11/02/2020 No Yes L97.828 Non-pressure chronic ulcer of other part of left lower leg with other 11/02/2020 No Yes specified severity Inactive Problems Resolved Problems Electronic Signature(s) Signed: 11/02/2020 5:03:12 PM By: Linton Ham MD Entered By: Linton Ham on 11/02/2020 15:34:13 [name] ([id number]) -------------------------------------------------------------------------------- Progress Note  Details Patient Name: [name] Date of Service: 11/02/2020 2:30 PM Medical Record Number: [id number] Patient Account Number: [id number] Date of Birth/Sex: 1945/08/29 (75 y.o. F) Treating RN: Dolan Amen Primary Care Provider: Harold Barban Other Clinician: Jeanine Luz Referring Provider: Referral, Self Treating Provider/Extender: Tito Dine in Treatment: 0 Subjective History of Present Illness (HPI) ADMISSION 11/02/2020 This is a 75 year old woman who spent 3 months in [place] just returning yesterday. On March 7 she was picking oranges for the need he had a fall and developed a skin tear on her left lower leg she is also able to show me pictures. She was seen in ER or urgent care. They tried to replace the skin with Steri-Strips. She suddenly developed swelling in the area. She went back to  the urgent care. She has been using Bactroban and Steri-Strips to try and reapplied approximate things ever since. She does have atrial fibrillation on anticoagulants. Past medical history includes paroxysmal atrial fibrillation, multiple skin problems followed by Dr. Nehemiah Massed at [facility] including seborrheic keratosis melanotic nevi hemangiomas and actinic damage secondary to chronic UV exposure. She also has a history of squamous cell carcinoma of the skin and has been treated with radiation to her legs at one point as well as I think phototherapy. She also has hyperlipidemia. ABI in our clinic was 1.19 on the left Patient History Information obtained from Patient. Allergies aspirin, latex, prednisone, adhesive tape (Severity: Mild, Reaction: irritation/thin skin) Social History Former smoker, Marital Status - Married, Alcohol Use - Rarely, Drug Use - No History, Caffeine Use - Never. Medical History Eyes Patient has history of Cataracts - bilateral surgery Cardiovascular Patient has history of Arrhythmia - Afib, Hypertension Denies history of Congestive Heart  Failure Gastrointestinal Patient has history of Colitis Endocrine Denies history of Type II Diabetes Oncologic Patient has history of Received Chemotherapy - eye cancers/drops Hospitalization/Surgery History - hysterectomy. - appendectomy. Medical And Surgical History Notes Eyes states cancer in both eyes had surgery Review of Systems (ROS) Constitutional Symptoms (General Health) Denies complaints or symptoms of Fatigue, Fever, Chills, Marked Weight Change. Ear/Nose/Mouth/Throat Denies complaints or symptoms of Difficult clearing ears, Sinusitis. Hematologic/Lymphatic Denies complaints or symptoms of Bleeding / Clotting Disorders, Human Immunodeficiency Virus. Respiratory Complains or has symptoms of Shortness of Breath - during Afib. Denies complaints or symptoms of Chronic or frequent coughs. Cardiovascular Denies complaints or symptoms of Chest pain, LE edema. Gastrointestinal Complains or has symptoms of Frequent diarrhea - IBS, hiatal hernia GERD IBS Endocrine Denies complaints or symptoms of Hepatitis, Thyroid disease, Polydypsia (Excessive Thirst). Genitourinary Denies complaints or symptoms of Kidney failure/ Dialysis, Incontinence/dribbling. Immunological Denies complaints or symptoms of Hives, Itching. Integumentary (Skin) [name], [name] ([id number]) goes to [facility] many skin cancers removed-squamous cell Musculoskeletal Denies complaints or symptoms of Muscle Pain, Muscle Weakness. Neurologic Denies complaints or symptoms of Numbness/parasthesias, Focal/Weakness. Psychiatric Complains or has symptoms of Anxiety. Objective Constitutional Sitting or standing Blood Pressure is within target range for patient.. Pulse regular and within target range for patient.Marland Kitchen Respirations regular, non- labored and within target range.. Temperature is normal and within the target range for the patient.Marland Kitchen appears in no distress. Vitals Time Taken: 2:39 PM, Height: 68  in, Source: Stated, Weight: 149 lbs, Source: Measured, BMI: 22.7, Temperature: 98.2 F, Pulse: 61 bpm, Respiratory Rate: 16 breaths/min, Blood Pressure: 107/65 mmHg. Cardiovascular Pedal pulses were normal on the left. General Notes: Wound exam; the area in question is on the left anterior mid tibia. The skin over the top of this was necrotic and black. This was removed. This uncovered a however no retained hematoma. We remove this with an open curette unfortunately there is a extending area from about 12-1 o'clock at about 2-1/2 cm. No evidence of infection. The tissue here looks healthy Integumentary (Hair, Skin) Skin changes of chronic actinic skin damage. Wound #1 status is Open. Original cause of wound was Trauma. The date acquired was: 09/26/2020. The wound is located on the Left,Midline Lower Leg. The wound measures 3cm length x 3.5cm width x 0.2cm depth; 8.247cm^2 area and 1.649cm^3 volume. There is Fat Layer (Subcutaneous Tissue) exposed. There is no tunneling noted, however, there is undermining starting at 11:00 and ending at 1:00 with a maximum distance of 2.5cm. There is a large amount of  sanguinous drainage noted. There is large (67-100%) red granulation within the wound bed. There is no necrotic tissue within the wound bed. Assessment Active Problems ICD-10 Contusion of left lower leg, subsequent encounter Non-pressure chronic ulcer of other part of left lower leg with other specified severity Procedures Wound #1 Pre-procedure diagnosis of Wound #1 is a Trauma, Other located on the Left,Midline Lower Leg . There was a Excisional Skin/Subcutaneous Tissue Debridement with a total area of 10.5 sq cm performed by Ricard Dillon, MD. With the following instrument(s): Forceps, scoop curette to remove Non-Viable tissue/material. Material removed includes Blood Clots, Subcutaneous Tissue, and Fibrin/Exudate after achieving pain control using Lidocaine 4% Topical Solution. A time out  was conducted at 15:20, prior to the start of the procedure. A Moderate amount of bleeding was controlled with Pressure. The procedure was tolerated well. Post Debridement Measurements: 1.8cm length x 1.2cm width x 0.6cm depth; 1.018cm^3 volume. Character of Wound/Ulcer Post Debridement is stable. Post procedure Diagnosis Wound #1: Same as Pre-Procedure Pre-procedure diagnosis of Wound #1 is a Skin Tear located on the Left,Midline Lower Leg . There was a Three Layer Compression Therapy Procedure with a pre-treatment ABI of 1.2 by Dolan Amen, RN. Post procedure Diagnosis Wound #1: Same as Pre-Procedure [name], [name] ([id number]) Plan Follow-up Appointments: Return Appointment in 1 week. Bathing/ Shower/ Hygiene: May shower with wound dressing protected with water repellent cover or cast protector. Edema Control - Lymphedema / Segmental Compressive Device / Other: Optional: One layer of unna paste to top of compression wrap (to act as an anchor). 3 Layer Compression System for Lymphedema. WOUND #1: - Lower Leg Wound Laterality: Left, Midline Cleanser: Soap and Water 1 x Per Week/30 Days Discharge Instructions: Gently cleanse wound with antibacterial soap, rinse and pat dry prior to dressing wounds Primary Dressing: Silvercel Small 2x2 (in/in) 1 x Per Week/30 Days Discharge Instructions: Apply Silvercel Small 2x2 (in/in) as instructed, tuck in undermining Secondary Dressing: Xtrasorb Medium 4x5 (in/in) 1 x Per Week/30 Days Discharge Instructions: Apply to wound as directed. Do not cut. Compression Wrap: Profore Lite LF 3 Multilayer Compression Bandaging System 1 x Per Week/30 Days Discharge Instructions: Apply 3 multi-layer wrap as prescribed. 1. Skin tear with underlying hematoma 2. The skin over the top of this was necrotic was removed the underlying old hematoma was evacuated. Unfortunately she has an extended area of hematoma 2.5 cm from 11-1 o'clock evacuating this also reveals  wound extended area. 3. All the hematoma was evacuated no need for antibiotics. 4. We are going to use silver alginate under compression. I am going to put particular compression over the undermining area to see if we can get some adherence although I doubt it. 5 this is not going to be an easy wound to heal. We will see how the studies with silver alginate or silver collagen over the next 2 to 3 weeks. A wound VAC would be an option to consider. Ultimately the overhanging skin over the area from 11-1 o'clock may need to be sacrificed here Electronic Signature(s) Signed: 11/02/2020 5:03:12 PM By: Linton Ham MD Entered By: Linton Ham on 11/02/2020 15:43:45 [name] ([id number]) -------------------------------------------------------------------------------- ROS/PFSH Details Patient Name: [name] Date of Service: 11/02/2020 2:30 PM Medical Record Number: [id number] Patient Account Number: [id number] Date of Birth/Sex: Jul 30, 1945 (75 y.o. F) Treating RN: Donnamarie Poag Primary Care Provider: Harold Barban Other Clinician: Jeanine Luz Referring Provider: Referral, Self Treating Provider/Extender: Tito Dine in Treatment: 0 Information Obtained From Patient  Constitutional Symptoms (General Health) Complaints and Symptoms: Negative for: Fatigue; Fever; Chills; Marked Weight Change Ear/Nose/Mouth/Throat Complaints and Symptoms: Negative for: Difficult clearing ears; Sinusitis Hematologic/Lymphatic Complaints and Symptoms: Negative for: Bleeding / Clotting Disorders; Human Immunodeficiency Virus Respiratory Complaints and Symptoms: Positive for: Shortness of Breath - during Afib Negative for: Chronic or frequent coughs Cardiovascular Complaints and Symptoms: Negative for: Chest pain; LE edema Medical History: Positive for: Arrhythmia - Afib; Hypertension Negative for: Congestive Heart Failure Gastrointestinal Complaints and Symptoms: Positive  for: Frequent diarrhea - IBS Review of System Notes: hiatal hernia GERD IBS Medical History: Positive for: Colitis Endocrine Complaints and Symptoms: Negative for: Hepatitis; Thyroid disease; Polydypsia (Excessive Thirst) Medical History: Negative for: Type II Diabetes Genitourinary Complaints and Symptoms: Negative for: Kidney failure/ Dialysis; Incontinence/dribbling Immunological [name], [name] ([id number]) Complaints and Symptoms: Negative for: Hives; Itching Musculoskeletal Complaints and Symptoms: Negative for: Muscle Pain; Muscle Weakness Neurologic Complaints and Symptoms: Negative for: Numbness/parasthesias; Focal/Weakness Psychiatric Complaints and Symptoms: Positive for: Anxiety Eyes Medical History: Positive for: Cataracts - bilateral surgery Past Medical History Notes: states cancer in both eyes had surgery Integumentary (Skin) Complaints and Symptoms: Review of System Notes: goes to [facility] many skin cancers removed-squamous cell Oncologic Medical History: Positive for: Received Chemotherapy - eye cancers/drops HBO Extended History Items Eyes: Cataracts Immunizations Pneumococcal Vaccine: Received Pneumococcal Vaccination: Yes Implantable Devices None Hospitalization / Surgery History Type of Hospitalization/Surgery hysterectomy appendectomy Family and Social History Former smoker; Marital Status - Married; Alcohol Use: Rarely; Drug Use: No History; Caffeine Use: Never; Financial Concerns: No; Food, Clothing or Shelter Needs: No; Support System Lacking: No; Transportation Concerns: No Electronic Signature(s) Signed: 11/02/2020 5:03:12 PM By: Linton Ham MD Signed: 11/03/2020 7:52:35 AM By: Donnamarie Poag Entered By: Donnamarie Poag on 11/02/2020 14:52:23 [name] ([id number]) -------------------------------------------------------------------------------- [place] Details Patient Name: [name] Date of Service:  11/02/2020 Medical Record Number: [id number] Patient Account Number: [id number] Date of Birth/Sex: 1946-04-28 (75 y.o. F) Treating RN: Dolan Amen Primary Care Provider: Harold Barban Other Clinician: Jeanine Luz Referring Provider: Referral, Self Treating Provider/Extender: Tito Dine in Treatment: 0 Diagnosis Coding ICD-10 Codes Code Description S80.12XD Contusion of left lower leg, subsequent encounter L97.828 Non-pressure chronic ulcer of other part of left lower leg with other specified severity Facility Procedures CPT4 Code: [id number] Description: [place] VISIT-LEV 3 EST PT Modifier: Quantity: 1 CPT4 Code: [id number] Description: [place] - DEB SUBQ TISSUE 20 SQ CM/< Modifier: Quantity: 1 CPT4 Code: Description: ICD-10 Diagnosis Description S80.12XD Contusion of left lower leg, subsequent encounter L97.828 Non-pressure chronic ulcer of other part of left lower leg with other speci Modifier: fied severity Quantity: Physician Procedures CPT4 Code: [id number] Description: WC PHYS LEVEL 3 o NEW PT Modifier: 25 Quantity: 1 CPT4 Code: Description: ICD-10 Diagnosis Description S80.12XD Contusion of left lower leg, subsequent encounter L97.828 Non-pressure chronic ulcer of other part of left lower leg with other spec Modifier: ified severity Quantity: CPT4 Code: [id number] Description: 29518 - WC PHYS SUBQ TISS 20 SQ CM Modifier: Quantity: 1 CPT4 Code: Description: ICD-10 Diagnosis Description S80.12XD Contusion of left lower leg, subsequent encounter L97.828 Non-pressure chronic ulcer of other part of left lower leg with other spec Modifier: ified severity Quantity: Electronic Signature(s) Signed: 11/02/2020 5:03:12 PM By: Linton Ham MD Entered By: Linton Ham on 11/02/2020 15:44:26

## 2020-11-03 NOTE — Progress Notes (Signed)
DALLYS, NOWAKOWSKI ([id number]) Visit Report for 11/02/2020 Abuse/Suicide Risk Screen Details Patient Name: [name], [name]. Date of Service: 11/02/2020 2:30 PM Medical Record Number: [id number] Patient Account Number: [id number] Date of Birth/Sex: 22-Apr-1946 (75 y.o. F) Treating RN: Donnamarie Poag Primary Care : Harold Barban Other Clinician: Jeanine Luz Referring : Referral, Self Treating /Extender: Tito Dine in Treatment: 0 Abuse/Suicide Risk Screen Items Answer ABUSE RISK SCREEN: Has anyone close to you tried to hurt or harm you recentlyo No Do you feel uncomfortable with anyone in your familyo No Has anyone forced you do things that you didnot want to doo No Electronic Signature(s) Signed: 11/03/2020 7:52:35 AM By: Donnamarie Poag Entered By: Donnamarie Poag on 11/02/2020 14:52:32 [name] ([id number]) -------------------------------------------------------------------------------- Activities of Daily Living Details Patient Name: [name] Date of Service: 11/02/2020 2:30 PM Medical Record Number: [id number] Patient Account Number: [id number] Date of Birth/Sex: 28-Sep-1945 (75 y.o. F) Treating RN: Donnamarie Poag Primary Care : Harold Barban Other Clinician: Jeanine Luz Referring : Referral, Self Treating /Extender: Tito Dine in Treatment: 0 Activities of Daily Living Items Answer Activities of Daily Living (Please select one for each item) Drive Automobile Completely Able Take Medications Completely Able Use Telephone Completely Able Care for Appearance Completely Able Use Toilet Completely Able Bath / Shower Completely Able Dress Self Completely Able Feed Self Completely Able Walk Completely Able Get In / Out Bed Completely Able Housework Completely Able Prepare Meals Completely [place] for Self Completely Able Electronic Signature(s) Signed: 11/03/2020  7:52:35 AM By: Donnamarie Poag Entered By: Donnamarie Poag on 11/02/2020 14:52:56 [name] ([id number]) -------------------------------------------------------------------------------- Education Screening Details Patient Name: [name] Date of Service: 11/02/2020 2:30 PM Medical Record Number: [id number] Patient Account Number: [id number] Date of Birth/Sex: October 13, 1945 (75 y.o. F) Treating RN: Donnamarie Poag Primary Care : Harold Barban Other Clinician: Jeanine Luz Referring : Referral, Self Treating /Extender: Tito Dine in Treatment: 0 Primary Learner Assessed: Patient Learning Preferences/Education Level/Primary Language Learning Preference: Explanation Highest Education Level: High School Preferred Language: English Cognitive Barrier Language Barrier: No Translator Needed: No Memory Deficit: No Emotional Barrier: No Cultural/Religious Beliefs Affecting Medical Care: No Physical Barrier Impaired Vision: No Impaired Hearing: No Decreased Hand dexterity: No Knowledge/Comprehension Knowledge Level: High Comprehension Level: High Ability to understand written instructions: High Ability to understand verbal instructions: High Motivation Anxiety Level: Calm Cooperation: Cooperative Education Importance: Acknowledges Need Interest in Health Problems: Asks Questions Perception: Coherent Willingness to Engage in Self-Management High Activities: Readiness to Engage in Self-Management High Activities: Electronic Signature(s) Signed: 11/03/2020 7:52:35 AM By: Donnamarie Poag Entered By: Donnamarie Poag on 11/02/2020 14:53:21 [name] ([id number]) -------------------------------------------------------------------------------- Fall Risk Assessment Details Patient Name: [name] Date of Service: 11/02/2020 2:30 PM Medical Record Number: [id number] Patient Account Number: [id number] Date of Birth/Sex: 24-Aug-1945 (75 y.o.  F) Treating RN: Donnamarie Poag Primary Care : Harold Barban Other Clinician: Jeanine Luz Referring : Referral, Self Treating /Extender: Tito Dine in Treatment: 0 Fall Risk Assessment Items Have you had 2 or more falls in the last 12 monthso 0 Yes Have you had any fall that resulted in injury in the last 12 monthso 0 Yes FALLS RISK SCREEN History of falling - immediate or within 3 months 25 Yes Secondary diagnosis (Do you have 2 or more medical diagnoseso) 0 No Ambulatory aid None/bed rest/wheelchair/nurse 0 Yes Crutches/cane/walker 0 No Furniture 0 No Intravenous therapy Access/Saline/Heparin Lock 0 No Gait/Transferring Normal/ bed  rest/ wheelchair 0 Yes Weak (short steps with or without shuffle, stooped but able to lift head while walking, may 0 No seek support from furniture) Impaired (short steps with shuffle, may have difficulty arising from chair, head down, impaired 0 No balance) Mental Status Oriented to own ability 0 Yes Notes fall was due to snow/ice and bad weather camping out of state/travels with husband Electronic Signature(s) Signed: 11/03/2020 7:52:35 AM By: Donnamarie Poag Entered By: Donnamarie Poag on 11/02/2020 14:54:14 [name] ([id number]) -------------------------------------------------------------------------------- Foot Assessment Details Patient Name: [name] Date of Service: 11/02/2020 2:30 PM Medical Record Number: [id number] Patient Account Number: [id number] Date of Birth/Sex: 1946/01/16 (75 y.o. F) Treating RN: Donnamarie Poag Primary Care : Harold Barban Other Clinician: Jeanine Luz Referring : Referral, Self Treating /Extender: Tito Dine in Treatment: 0 Foot Assessment Items Site Locations + = Sensation present, - = Sensation absent, C = Callus, U = Ulcer R = Redness, W = Warmth, M = Maceration, PU = Pre-ulcerative lesion F = Fissure, S = Swelling,  D = Dryness Assessment Right: Left: Other Deformity: No No Prior Foot Ulcer: No No Prior Amputation: No No Charcot Joint: No No Ambulatory Status: Ambulatory Without Help Gait: Steady Electronic Signature(s) Signed: 11/03/2020 7:52:35 AM By: Donnamarie Poag Entered By: Donnamarie Poag on 11/02/2020 14:55:30 [name] ([id number]) -------------------------------------------------------------------------------- Nutrition Risk Screening Details Patient Name: [name] Date of Service: 11/02/2020 2:30 PM Medical Record Number: [id number] Patient Account Number: [id number] Date of Birth/Sex: 1946-06-20 (75 y.o. F) Treating RN: Donnamarie Poag Primary Care : Harold Barban Other Clinician: Jeanine Luz Referring : Referral, Self Treating /Extender: Tito Dine in Treatment: 0 Height (in): 68 Weight (lbs): 149 Body Mass Index (BMI): 22.7 Nutrition Risk Screening Items Score Screening NUTRITION RISK SCREEN: I have an illness or condition that made me change the kind and/or amount of food I eat 0 No I eat fewer than two meals per day 0 No I eat few fruits and vegetables, or milk products 0 No I have three or more drinks of beer, liquor or wine almost every day 0 No I have tooth or mouth problems that make it hard for me to eat 0 No I don't always have enough money to buy the food I need 0 No I eat alone most of the time 0 No I take three or more different prescribed or over-the-counter drugs a day 1 Yes Without wanting to, I have lost or gained 10 pounds in the last six months 0 No I am not always physically able to shop, cook and/or feed myself 0 No Nutrition Protocols Good Risk Protocol 0 No interventions needed Moderate Risk Protocol High Risk Proctocol Risk Level: Good Risk Score: 1 Electronic Signature(s) Signed: 11/03/2020 7:52:35 AM By: Donnamarie Poag Entered ByDonnamarie Poag on 11/02/2020 14:54:30

## 2020-11-08 ENCOUNTER — Encounter: Payer: PPO | Admitting: Physician Assistant

## 2020-11-08 ENCOUNTER — Other Ambulatory Visit: Payer: Self-pay

## 2020-11-08 DIAGNOSIS — L97822 Non-pressure chronic ulcer of other part of left lower leg with fat layer exposed: Secondary | ICD-10-CM | POA: Diagnosis not present

## 2020-11-08 DIAGNOSIS — S8012XD Contusion of left lower leg, subsequent encounter: Secondary | ICD-10-CM | POA: Diagnosis not present

## 2020-11-08 NOTE — Progress Notes (Addendum)
[name], [name] ([id number]) Visit Report for 11/08/2020 Arrival Information Details Patient Name: [name], [name]. Date of Service: 11/08/2020 2:45 PM Medical Record Number: [id number] Patient Account Number: [id number] Date of Birth/Sex: 12-Sep-1945 (75 y.o. F) Treating RN: Carlene Coria Primary Care : Harold Barban Other Clinician: Jeanine Luz Referring : Harold Barban Treating /Extender: Skipper Cliche in Treatment: 0 Visit Information History Since Last Visit All ordered tests and consults were completed: No Patient Arrived: Ambulatory Added or deleted any medications: No Arrival Time: 14:59 Any new allergies or adverse reactions: No Accompanied By: self Had a fall or experienced change in No Transfer Assistance: None activities of daily living that may affect Patient Identification Verified: Yes risk of falls: Secondary Verification Process Completed: Yes Signs or symptoms of abuse/neglect since last visito No Patient Requires Transmission-Based No Hospitalized since last visit: No Precautions: Implantable device outside of the clinic excluding No Patient Has Alerts: Yes cellular tissue based products placed in the center Patient Alerts: Patient on Blood since last visit: Thinner Has Dressing in Place as Prescribed: Yes NOT diabetic Has Compression in Place as Prescribed: Yes XARELTO Pain Present Now: No Electronic Signature(s) Signed: 11/16/2020 3:51:10 PM By: Carlene Coria RN Entered By: Carlene Coria on 11/08/2020 14:59:40 [name] ([id number]) -------------------------------------------------------------------------------- Clinic Level of Care Assessment Details Patient Name: [name] Date of Service: 11/08/2020 2:45 PM Medical Record Number: [id number] Patient Account Number: [id number] Date of Birth/Sex: May 26, 1946 (75 y.o. F) Treating RN: Dolan Amen Primary Care : Harold Barban Other Clinician:  Jeanine Luz Referring : Harold Barban Treating /Extender: Skipper Cliche in Treatment: 0 Clinic Level of Care Assessment Items TOOL 1 Quantity Score []  - Use when EandM and Procedure is performed on INITIAL visit 0 ASSESSMENTS - Nursing Assessment / Reassessment []  - General Physical Exam (combine w/ comprehensive assessment (listed just below) when performed on new 0 pt. evals) []  - 0 Comprehensive Assessment (HX, ROS, Risk Assessments, Wounds Hx, etc.) ASSESSMENTS - Wound and Skin Assessment / Reassessment []  - Dermatologic / Skin Assessment (not related to wound area) 0 ASSESSMENTS - Ostomy and/or Continence Assessment and Care []  - Incontinence Assessment and Management 0 []  - 0 Ostomy Care Assessment and Management (repouching, etc.) PROCESS - Coordination of Care []  - Simple Patient / Family Education for ongoing care 0 []  - 0 Complex (extensive) Patient / Family Education for ongoing care []  - 0 Staff obtains Programmer, systems, Records, Test Results / Process Orders []  - 0 Staff telephones HHA, Nursing Homes / Clarify orders / etc []  - 0 Routine Transfer to another Facility (non-emergent condition) []  - 0 Routine Hospital Admission (non-emergent condition) []  - 0 New Admissions / Biomedical engineer / Ordering NPWT, Apligraf, etc. []  - 0 Emergency Hospital Admission (emergent condition) PROCESS - Special Needs []  - Pediatric / Minor Patient Management 0 []  - 0 Isolation Patient Management []  - 0 Hearing / Language / Visual special needs []  - 0 Assessment of Community assistance (transportation, D/C planning, etc.) []  - 0 Additional assistance / Altered mentation []  - 0 Support Surface(s) Assessment (bed, cushion, seat, etc.) INTERVENTIONS - Miscellaneous []  - External ear exam 0 []  - 0 Patient Transfer (multiple staff / Civil Service fast streamer / Similar devices) []  - 0 Simple Staple / Suture removal (25 or less) []  - 0 Complex Staple / Suture  removal (26 or more) []  - 0 Hypo/Hyperglycemic Management (do not check if billed separately) []  - 0 Ankle / Brachial Index (ABI) - do not check if  billed separately Has the patient been seen at the hospital within the last three years: Yes Total Score: 0 Level Of Care: ____ [name] ([id number]) Electronic Signature(s) Signed: 11/08/2020 5:05:23 PM By: Georges Mouse, Minus Breeding RN Entered By: Georges Mouse, [place] on 11/08/2020 15:51:52 [name] ([id number]) -------------------------------------------------------------------------------- Compression Therapy Details Patient Name: [name] Date of Service: 11/08/2020 2:45 PM Medical Record Number: [id number] Patient Account Number: [id number] Date of Birth/Sex: [birth year] (75 y.o. F) Treating RN: Dolan Amen Primary Care : Harold Barban Other Clinician: Jeanine Luz Referring : Harold Barban Treating /Extender: Skipper Cliche in Treatment: 0 Compression Therapy Performed for Wound Assessment: Wound #1 Left,Midline Lower Leg Performed By: Clinician Dolan Amen, RN Compression Type: Three Layer Pre Treatment ABI: 1.2 Post Procedure Diagnosis Same as Pre-procedure Electronic Signature(s) Signed: 11/08/2020 5:05:23 PM By: Georges Mouse, Minus Breeding RN Entered By: Georges Mouse, [place] on 11/08/2020 15:48:31 [name] ([id number]) -------------------------------------------------------------------------------- Encounter Discharge Information Details Patient Name: [name] Date of Service: 11/08/2020 2:45 PM Medical Record Number: [id number] Patient Account Number: [id number] Date of Birth/Sex: 01/05/1946 (75 y.o. F) Treating RN: Donnamarie Poag Primary Care : Harold Barban Other Clinician: Jeanine Luz Referring : Harold Barban Treating /Extender: Skipper Cliche in Treatment: 0 Encounter Discharge Information Items Discharge  Condition: Stable Ambulatory Status: Ambulatory Discharge Destination: Home Transportation: Private Auto Accompanied By: husband Schedule Follow-up Appointment: Yes Clinical Summary of Care: Electronic Signature(s) Signed: 11/08/2020 4:18:00 PM By: Donnamarie Poag Entered By: Donnamarie Poag on 11/08/2020 16:09:29 [name] ([id number]) -------------------------------------------------------------------------------- Lower Extremity Assessment Details Patient Name: [name] Date of Service: 11/08/2020 2:45 PM Medical Record Number: [id number] Patient Account Number: [id number] Date of Birth/Sex: 1945/09/19 (75 y.o. F) Treating RN: Carlene Coria Primary Care : Harold Barban Other Clinician: Jeanine Luz Referring : Harold Barban Treating /Extender: Skipper Cliche in Treatment: 0 Edema Assessment Assessed: [Left: No] [Right: No] Edema: [Left: Ye] [Right: s] Calf Left: Right: Point of Measurement: 22 cm From Medial Instep 35 cm Ankle Left: Right: Point of Measurement: 11 cm From Medial Instep 21 cm Vascular Assessment Pulses: Dorsalis Pedis Palpable: [Left:Yes] Electronic Signature(s) Signed: 11/16/2020 3:51:10 PM By: Carlene Coria RN Entered By: Carlene Coria on 11/08/2020 15:13:15 [name] ([id number]) -------------------------------------------------------------------------------- Multi Wound Chart Details Patient Name: [name] Date of Service: 11/08/2020 2:45 PM Medical Record Number: [id number] Patient Account Number: [id number] Date of Birth/Sex: 21-Mar-1946 (75 y.o. F) Treating RN: Dolan Amen Primary Care : Harold Barban Other Clinician: Jeanine Luz Referring : Harold Barban Treating /Extender: Skipper Cliche in Treatment: 0 Vital Signs Height(in): 63 Pulse(bpm): 51 Weight(lbs): 149 Blood Pressure(mmHg): 126/74 Body Mass Index(BMI): 23 Temperature(F): 98.2 Respiratory  Rate(breaths/min): 18 Photos: [N/A:N/A] Wound Location: Left, Midline Lower Leg N/A N/A Wounding Event: Trauma N/A N/A Primary Etiology: Trauma, Other N/A N/A Comorbid History: Cataracts, Arrhythmia, N/A N/A Hypertension, Colitis, Received Chemotherapy Date Acquired: 09/26/2020 N/A N/A Weeks of Treatment: 0 N/A N/A Wound Status: Open N/A N/A Measurements L x W x D (cm) 2x3x0.4 N/A N/A Area (cm) : 4.712 N/A N/A Volume (cm) : 1.885 N/A N/A % Reduction in Area: 42.90% N/A N/A % Reduction in Volume: -14.30% N/A N/A Starting Position 1 (o'clock): 11 Ending Position 1 (o'clock): 12 Maximum Distance 1 (cm): 0.6 Undermining: Yes N/A N/A Classification: Full Thickness Without Exposed N/A N/A Support Structures Exudate Amount: Medium N/A N/A Exudate Type: Serosanguineous N/A N/A Exudate Color: red, brown N/A N/A Granulation Amount: Large (67-100%) N/A N/A Granulation Quality: Red N/A N/A  Necrotic Amount: None Present (0%) N/A N/A Exposed Structures: Fat Layer (Subcutaneous Tissue): N/A N/A Yes Fascia: No Tendon: No Muscle: No Joint: No Bone: No Epithelialization: None N/A N/A Treatment Notes Electronic Signature(s) Signed: 11/08/2020 5:05:23 PM By: Georges Mouse, Minus Breeding RN [place], [name] ([id number]) Entered By: Georges Mouse, Minus Breeding on 11/08/2020 15:46:28 [name] ([id number]) -------------------------------------------------------------------------------- [place] Details Patient Name: [name] Date of Service: 11/08/2020 2:45 PM Medical Record Number: [id number] Patient Account Number: [id number] Date of Birth/Sex: 1946-01-13 (75 y.o. F) Treating RN: Dolan Amen Primary Care : Harold Barban Other Clinician: Jeanine Luz Referring : Harold Barban Treating /Extender: Skipper Cliche in Treatment: 0 Active Inactive Necrotic Tissue Nursing Diagnoses: Impaired tissue integrity related to  necrotic/devitalized tissue Goals: Necrotic/devitalized tissue will be minimized in the wound bed Date Initiated: 11/02/2020 Target Resolution Date: 11/02/2020 Goal Status: Active Patient/caregiver will verbalize understanding of reason and process for debridement of necrotic tissue Date Initiated: 11/02/2020 Target Resolution Date: 11/02/2020 Goal Status: Active Interventions: Assess patient pain level pre-, during and post procedure and prior to discharge Provide education on necrotic tissue and debridement process Treatment Activities: Apply topical anesthetic as ordered : 11/02/2020 Excisional debridement : 11/02/2020 Notes: Wound/Skin Impairment Nursing Diagnoses: Impaired tissue integrity Goals: Patient/caregiver will verbalize understanding of skin care regimen Date Initiated: 11/02/2020 Target Resolution Date: 11/02/2020 Goal Status: Active Ulcer/skin breakdown will have a volume reduction of 30% by week 4 Date Initiated: 11/02/2020 Target Resolution Date: 12/02/2020 Goal Status: Active Ulcer/skin breakdown will have a volume reduction of 50% by week 8 Date Initiated: 11/02/2020 Target Resolution Date: 01/02/2021 Goal Status: Active Ulcer/skin breakdown will have a volume reduction of 80% by week 12 Date Initiated: 11/02/2020 Target Resolution Date: 02/01/2021 Goal Status: Active Ulcer/skin breakdown will heal within 14 weeks Date Initiated: 11/02/2020 Target Resolution Date: 03/04/2021 Goal Status: Active Interventions: Assess patient/caregiver ability to obtain necessary supplies Assess patient/caregiver ability to perform ulcer/skin care regimen upon admission and as needed Assess ulceration(s) every visit Provide education on ulcer and skin care Treatment Activities: Referred to DME  for dressing supplies : 11/02/2020 [name], [name] ([id number]) Skin care regimen initiated : 11/02/2020 Topical wound management initiated : 11/02/2020 Notes: Electronic  Signature(s) Signed: 11/08/2020 5:05:23 PM By: Georges Mouse, Minus Breeding RN Entered By: Georges Mouse, Minus Breeding on 11/08/2020 15:46:09 [name] ([id number]) -------------------------------------------------------------------------------- Pain Assessment Details Patient Name: [name] Date of Service: 11/08/2020 2:45 PM Medical Record Number: [id number] Patient Account Number: [id number] Date of Birth/Sex: April 22, 1946 (75 y.o. F) Treating RN: Carlene Coria Primary Care : Harold Barban Other Clinician: Jeanine Luz Referring : Harold Barban Treating /Extender: Skipper Cliche in Treatment: 0 Active Problems Location of Pain Severity and Description of Pain Patient Has Paino No Site Locations Pain Management and Medication Current Pain Management: Electronic Signature(s) Signed: 11/16/2020 3:51:10 PM By: Carlene Coria RN Entered By: Carlene Coria on 11/08/2020 15:10:19 [name] ([id number]) -------------------------------------------------------------------------------- Patient/Caregiver Education Details Patient Name: [name] Date of Service: 11/08/2020 2:45 PM Medical Record Number: [id number] Patient Account Number: [id number] Date of Birth/Gender: May 23, 1946 (75 y.o. F) Treating RN: Dolan Amen Primary Care Physician: Harold Barban Other Clinician: Jeanine Luz Referring Physician: Harold Barban Treating Physician/Extender: Skipper Cliche in Treatment: 0 Education Assessment Education Provided To: Patient Education Topics Provided Wound/Skin Impairment: Methods: Explain/Verbal Responses: State content correctly Electronic Signature(s) Signed: 11/08/2020 5:05:23 PM By: Georges Mouse, Minus Breeding RN Entered By: Georges Mouse, Minus Breeding on 11/08/2020 15:52:08 [name] ([id number]) -------------------------------------------------------------------------------- Wound Assessment Details Patient  Name:  [name], Heather H. Date of Service: 11/08/2020 2:45 PM Medical Record Number: [id number] Patient Account Number: [id number] Date of Birth/Sex: 01-Aug-1945 (75 y.o. F) Treating RN: Carlene Coria Primary Care : Harold Barban Other Clinician: Jeanine Luz Referring : Harold Barban Treating /Extender: Skipper Cliche in Treatment: 0 Wound Status Wound Number: 1 Primary Trauma, Other Etiology: Wound Location: Left, Midline Lower Leg Wound Status: Open Wounding Event: Trauma Comorbid Cataracts, Arrhythmia, Hypertension, Colitis, Received Date Acquired: 09/26/2020 History: Chemotherapy Weeks Of Treatment: 0 Clustered Wound: No Photos Wound Measurements Length: (cm) 2 Width: (cm) 3 Depth: (cm) 0.4 Area: (cm) 4.712 Volume: (cm) 1.885 % Reduction in Area: 42.9% % Reduction in Volume: -14.3% Epithelialization: None Tunneling: No Undermining: Yes Starting Position (o'clock): 11 Ending Position (o'clock): 12 Maximum Distance: (cm) 0.6 Wound Description Classification: Full Thickness Without Exposed Support Structures Exudate Amount: Medium Exudate Type: Serosanguineous Exudate Color: red, brown Foul Odor After Cleansing: No Slough/Fibrino Yes Wound Bed Granulation Amount: Large (67-100%) Exposed Structure Granulation Quality: Red Fascia Exposed: No Necrotic Amount: None Present (0%) Fat Layer (Subcutaneous Tissue) Exposed: Yes Tendon Exposed: No Muscle Exposed: No Joint Exposed: No Bone Exposed: No Electronic Signature(s) Signed: 11/16/2020 3:51:10 PM By: Carlene Coria RN Entered By: Carlene Coria on 11/08/2020 15:14:48 [name] ([id number]) -------------------------------------------------------------------------------- Vitals Details Patient Name: [name] Date of Service: 11/08/2020 2:45 PM Medical Record Number: [id number] Patient Account Number: [id number] Date of Birth/Sex: March 11, 1946 (75 y.o. F) Treating RN: Carlene Coria Primary Care : Harold Barban Other Clinician: Jeanine Luz Referring : Harold Barban Treating /Extender: Skipper Cliche in Treatment: 0 Vital Signs Time Taken: 15:09 Temperature (F): 98.2 Height (in): 68 Pulse (bpm): 56 Weight (lbs): 149 Respiratory Rate (breaths/min): 18 Body Mass Index (BMI): 22.7 Blood Pressure (mmHg): 126/74 Reference Range: 80 - 120 mg / dl Electronic Signature(s) Signed: 11/16/2020 3:51:10 PM By: Carlene Coria RN Entered By: Carlene Coria on 11/08/2020 15:09:41

## 2020-11-08 NOTE — Progress Notes (Addendum)
[name], [name] ([id number]) Visit Report for 11/08/2020 Chief Complaint Document Details Patient Name: [name], [name]. Date of Service: 11/08/2020 2:45 PM Medical Record Number: [id number] Patient Account Number: [id number] Date of Birth/Sex: 1946-02-21 (75 y.o. F) Treating RN: Dolan Amen Primary Care Provider: Harold Barban Other Clinician: Jeanine Luz Referring Provider: Harold Barban Treating Provider/Extender: Skipper Cliche in Treatment: 0 Information Obtained from: Patient Electronic Signature(s) Signed: 11/08/2020 2:53:31 PM By: Worthy Keeler PA-C Entered By: Worthy Keeler on 11/08/2020 14:53:31 [name] ([id number]) -------------------------------------------------------------------------------- HPI Details Patient Name: [name] Date of Service: 11/08/2020 2:45 PM Medical Record Number: [id number] Patient Account Number: [id number] Date of Birth/Sex: [birth year] (75 y.o. F) Treating RN: Dolan Amen Primary Care Provider: Harold Barban Other Clinician: Jeanine Luz Referring Provider: Harold Barban Treating Provider/Extender: Skipper Cliche in Treatment: 0 History of Present Illness HPI Description: ADMISSION 11/02/2020 This is a 75 year old woman who spent 3 months in [place] just returning yesterday. On March 7 she was picking oranges for the need he had a fall and developed a skin tear on her left lower leg she is also able to show me pictures. She was seen in ER or urgent care. They tried to replace the skin with Steri-Strips. She suddenly developed swelling in the area. She went back to the urgent care. She has been using Bactroban and Steri-Strips to try and reapplied approximate things ever since. She does have atrial fibrillation on anticoagulants. Past medical history includes paroxysmal atrial fibrillation, multiple skin problems followed by Dr. Nehemiah Massed at [facility] including seborrheic keratosis melanotic nevi  hemangiomas and actinic damage secondary to chronic UV exposure. She also has a history of squamous cell carcinoma of the skin and has been treated with radiation to her legs at one point as well as I think phototherapy. She also has hyperlipidemia. ABI in our clinic was 1.19 on the left 11/08/2020 upon evaluation today patient appears to be doing well with regard to her wound on her leg. This is showing signs of good improvement which is great news and overall there does not appear to be any signs of active infection at this time which is great news. Obviously this is dramatically improved even compared to last week when Dr. Dellia Nims saw her. Electronic Signature(s) Signed: 11/08/2020 5:52:32 PM By: Worthy Keeler PA-C Entered By: Worthy Keeler on 11/08/2020 17:52:32 [name] ([id number]) -------------------------------------------------------------------------------- Physical Exam Details Patient Name: [name] Date of Service: 11/08/2020 2:45 PM Medical Record Number: [id number] Patient Account Number: [id number] Date of Birth/Sex: 18-Jun-1946 (75 y.o. F) Treating RN: Dolan Amen Primary Care Provider: Harold Barban Other Clinician: Jeanine Luz Referring Provider: Harold Barban Treating Provider/Extender: Skipper Cliche in Treatment: 0 Constitutional Well-nourished and well-hydrated in no acute distress. Respiratory normal breathing without difficulty. Psychiatric this patient is able to make decisions and demonstrates good insight into disease process. Alert and Oriented x 3. pleasant and cooperative. Notes Patient's wound bed showed signs of good granulation epithelization there is no significant slough buildup and no hematoma noted at this point. All of which is excellent news I think she is managing quite nicely. She does seem to be a little concerned about the fact that she does have some scar tissue and this appears to likely be a wound that is going  to heal with a raised area. With that being said I explained there is really not much that can be done to manage that differently. Likely she is going to have  some scarring and this is going to be a little bit raised compared to the other areas of her leg but nonetheless I think that it will not cause any harm long-term which is good news. Electronic Signature(s) Signed: 11/08/2020 5:53:22 PM By: Worthy Keeler PA-C Entered By: Worthy Keeler on 11/08/2020 17:53:22 [name] ([id number]) -------------------------------------------------------------------------------- Physician Orders Details Patient Name: [name] Date of Service: 11/08/2020 2:45 PM Medical Record Number: [id number] Patient Account Number: [id number] Date of Birth/Sex: 12-08-1945 (75 y.o. F) Treating RN: Dolan Amen Primary Care Provider: Harold Barban Other Clinician: Jeanine Luz Referring Provider: Harold Barban Treating Provider/Extender: Skipper Cliche in Treatment: 0 Verbal / Phone Orders: No Diagnosis Coding ICD-10 Coding Code Description S80.12XD Contusion of left lower leg, subsequent encounter L97.828 Non-pressure chronic ulcer of other part of left lower leg with other specified severity Follow-up Appointments o Return Appointment in 1 week. Bathing/ Shower/ Hygiene o May shower with wound dressing protected with water repellent cover or cast protector. Edema Control - Lymphedema / Segmental Compressive Device / Other Left Lower Extremity o 3 Layer Compression System for Lymphedema. - NO UNNA PASTE Wound Treatment Wound #1 - Lower Leg Wound Laterality: Left, Midline Cleanser: Soap and Water 1 x Per Week/30 Days Discharge Instructions: Gently cleanse wound with antibacterial soap, rinse and pat dry prior to dressing wounds Primary Dressing: Silvercel Small 2x2 (in/in) 1 x Per Week/30 Days Discharge Instructions: Apply Silvercel Small 2x2 (in/in) as instructed, tuck in  undermining-leave a "tail" to be able to pull out Secondary Dressing: Xtrasorb Medium 4x5 (in/in) 1 x Per Week/30 Days Discharge Instructions: Apply to wound as directed. Do not cut. Compression Wrap: Profore Lite LF 3 Multilayer Compression Bandaging System 1 x Per Week/30 Days Discharge Instructions: Apply 3 multi-layer wrap as prescribed. Electronic Signature(s) Signed: 11/08/2020 5:05:23 PM By: Georges Mouse, Minus Breeding RN Signed: 11/09/2020 12:58:56 PM By: Worthy Keeler PA-C Entered By: Georges Mouse, Minus Breeding on 11/08/2020 15:55:42 [name] ([id number]) -------------------------------------------------------------------------------- Problem List Details Patient Name: [name] Date of Service: 11/08/2020 2:45 PM Medical Record Number: [id number] Patient Account Number: [id number] Date of Birth/Sex: Jan 29, 1946 (75 y.o. F) Treating RN: Dolan Amen Primary Care Provider: Harold Barban Other Clinician: Jeanine Luz Referring Provider: Harold Barban Treating Provider/Extender: Skipper Cliche in Treatment: 0 Active Problems ICD-10 Encounter Code Description Active Date MDM Diagnosis S80.12XD Contusion of left lower leg, subsequent encounter 11/02/2020 No Yes L97.828 Non-pressure chronic ulcer of other part of left lower leg with other 11/02/2020 No Yes specified severity Inactive Problems Resolved Problems Electronic Signature(s) Signed: 11/08/2020 2:53:24 PM By: Worthy Keeler PA-C Entered By: Worthy Keeler on 11/08/2020 14:53:24 [name] ([id number]) -------------------------------------------------------------------------------- Progress Note Details Patient Name: [name] Date of Service: 11/08/2020 2:45 PM Medical Record Number: [id number] Patient Account Number: [id number] Date of Birth/Sex: 08-29-1945 (75 y.o. F) Treating RN: Dolan Amen Primary Care Provider: Harold Barban Other Clinician: Jeanine Luz Referring Provider:  Harold Barban Treating Provider/Extender: Skipper Cliche in Treatment: 0 Subjective Chief Complaint Information obtained from Patient History of Present Illness (HPI) ADMISSION 11/02/2020 This is a 75 year old woman who spent 3 months in [place] just returning yesterday. On March 7 she was picking oranges for the need he had a fall and developed a skin tear on her left lower leg she is also able to show me pictures. She was seen in ER or urgent care. They tried to replace the skin with Steri-Strips. She suddenly developed swelling in the  area. She went back to the urgent care. She has been using Bactroban and Steri-Strips to try and reapplied approximate things ever since. She does have atrial fibrillation on anticoagulants. Past medical history includes paroxysmal atrial fibrillation, multiple skin problems followed by Dr. Nehemiah Massed at [facility] including seborrheic keratosis melanotic nevi hemangiomas and actinic damage secondary to chronic UV exposure. She also has a history of squamous cell carcinoma of the skin and has been treated with radiation to her legs at one point as well as I think phototherapy. She also has hyperlipidemia. ABI in our clinic was 1.19 on the left 11/08/2020 upon evaluation today patient appears to be doing well with regard to her wound on her leg. This is showing signs of good improvement which is great news and overall there does not appear to be any signs of active infection at this time which is great news. Obviously this is dramatically improved even compared to last week when Dr. Dellia Nims saw her. Objective Constitutional Well-nourished and well-hydrated in no acute distress. Vitals Time Taken: 3:09 PM, Height: 68 in, Weight: 149 lbs, BMI: 22.7, Temperature: 98.2 F, Pulse: 56 bpm, Respiratory Rate: 18 breaths/min, Blood Pressure: 126/74 mmHg. Respiratory normal breathing without difficulty. Psychiatric this patient is able to make decisions and  demonstrates good insight into disease process. Alert and Oriented x 3. pleasant and cooperative. General Notes: Patient's wound bed showed signs of good granulation epithelization there is no significant slough buildup and no hematoma noted at this point. All of which is excellent news I think she is managing quite nicely. She does seem to be a little concerned about the fact that she does have some scar tissue and this appears to likely be a wound that is going to heal with a raised area. With that being said I explained there is really not much that can be done to manage that differently. Likely she is going to have some scarring and this is going to be a little bit raised compared to the other areas of her leg but nonetheless I think that it will not cause any harm long-term which is good news. Integumentary (Hair, Skin) Wound #1 status is Open. Original cause of wound was Trauma. The date acquired was: 09/26/2020. The wound is located on the Left,Midline Lower Leg. The wound measures 2cm length x 3cm width x 0.4cm depth; 4.712cm^2 area and 1.885cm^3 volume. There is Fat Layer (Subcutaneous Tissue) exposed. There is no tunneling noted, however, there is undermining starting at 11:00 and ending at 12:00 with a maximum distance of 0.6cm. There is a medium amount of serosanguineous drainage noted. There is large (67-100%) red granulation within the wound bed. There is no necrotic tissue within the wound bed. Assessment [name], [name] ([id number]) Active Problems ICD-10 Contusion of left lower leg, subsequent encounter Non-pressure chronic ulcer of other part of left lower leg with other specified severity Procedures Wound #1 Pre-procedure diagnosis of Wound #1 is a Trauma, Other located on the Left,Midline Lower Leg . There was a Three Layer Compression Therapy Procedure with a pre-treatment ABI of 1.2 by Dolan Amen, RN. Post procedure Diagnosis Wound #1: Same as  Pre-Procedure Plan Follow-up Appointments: Return Appointment in 1 week. Bathing/ Shower/ Hygiene: May shower with wound dressing protected with water repellent cover or cast protector. Edema Control - Lymphedema / Segmental Compressive Device / Other: 3 Layer Compression System for Lymphedema. - NO UNNA PASTE WOUND #1: - Lower Leg Wound Laterality: Left, Midline Cleanser: Soap and Water 1  x Per Week/30 Days Discharge Instructions: Gently cleanse wound with antibacterial soap, rinse and pat dry prior to dressing wounds Primary Dressing: Silvercel Small 2x2 (in/in) 1 x Per Week/30 Days Discharge Instructions: Apply Silvercel Small 2x2 (in/in) as instructed, tuck in undermining-leave a "tail" to be able to pull out Secondary Dressing: Xtrasorb Medium 4x5 (in/in) 1 x Per Week/30 Days Discharge Instructions: Apply to wound as directed. Do not cut. Compression Wrap: Profore Lite LF 3 Multilayer Compression Bandaging System 1 x Per Week/30 Days Discharge Instructions: Apply 3 multi-layer wrap as prescribed. 1. I would recommend currently that we going continue with the silver alginate dressing I think that still a good way to go and patient is in agreement with that plan. 2. I am also can recommend continuation of the 3 layer compression wrap that does seem to be beneficial for her as well. 3. We will avoid doing a paste at the top as that was somewhat irritating the wound due to the location of the wound. We will see patient back for reevaluation in 1 week here in the clinic. If anything worsens or changes patient will contact our office for additional recommendations. Electronic Signature(s) Signed: 11/08/2020 5:53:45 PM By: Worthy Keeler PA-C Entered By: Worthy Keeler on 11/08/2020 17:53:45 [name] ([id number]) -------------------------------------------------------------------------------- SuperBill Details Patient Name: [name] Date of Service: 11/08/2020 Medical  Record Number: [id number] Patient Account Number: [id number] Date of Birth/Sex: 1945-11-14 (75 y.o. F) Treating RN: Dolan Amen Primary Care Provider: Harold Barban Other Clinician: Jeanine Luz Referring Provider: Harold Barban Treating Provider/Extender: Skipper Cliche in Treatment: 0 Diagnosis Coding ICD-10 Codes Code Description [phone] Contusion of left lower leg, subsequent encounter L97.828 Non-pressure chronic ulcer of other part of left lower leg with other specified severity Facility Procedures CPT4 Code: [id number] Description: (Facility Use Only) [phone] - [facility] L[facility] LT LEG Modifier: Quantity: 1 Physician Procedures CPT4 Code: [id number] Description: 81017 - WC PHYS LEVEL 3 - EST PT Modifier: Quantity: 1 CPT4 Code: Description: ICD-10 Diagnosis Description S80.12XD Contusion of left lower leg, subsequent encounter L97.828 Non-pressure chronic ulcer of other part of left lower leg with other spe Modifier: cified severity Quantity: Electronic Signature(s) Signed: 11/08/2020 5:53:57 PM By: Worthy Keeler PA-C Previous Signature: 11/08/2020 5:05:23 PM Version By: Georges Mouse, Minus Breeding RN Entered By: Worthy Keeler on 11/08/2020 17:53:57

## 2020-11-10 ENCOUNTER — Ambulatory Visit: Payer: PPO | Admitting: Physician Assistant

## 2020-11-16 ENCOUNTER — Encounter: Payer: PPO | Admitting: Internal Medicine

## 2020-11-16 ENCOUNTER — Other Ambulatory Visit: Payer: Self-pay

## 2020-11-16 ENCOUNTER — Ambulatory Visit: Payer: PPO | Admitting: Dermatology

## 2020-11-16 DIAGNOSIS — L97822 Non-pressure chronic ulcer of other part of left lower leg with fat layer exposed: Secondary | ICD-10-CM | POA: Diagnosis not present

## 2020-11-16 DIAGNOSIS — S8012XD Contusion of left lower leg, subsequent encounter: Secondary | ICD-10-CM | POA: Diagnosis not present

## 2020-11-16 NOTE — Progress Notes (Signed)
SEE, BEHARRY ([id number]) Visit Report for 11/16/2020 HPI Details Patient Name: [name], [name]. Date of Service: 11/16/2020 8:30 AM Medical Record Number: [id number] Patient Account Number: [id number] Date of Birth/Sex: January 22, 1946 (75 y.o. F) Treating RN: Cornell Barman Primary Care Provider: Harold Barban Other Clinician: Jeanine Luz Referring Provider: Harold Barban Treating Provider/Extender: Tito Dine in Treatment: 2 History of Present Illness HPI Description: ADMISSION 11/02/2020 This is a 75 year old woman who spent 3 months in [place] just returning yesterday. On March 7 she was picking oranges for the need he had a fall and developed a skin tear on her left lower leg she is also able to show me pictures. She was seen in ER or urgent care. They tried to replace the skin with Steri-Strips. She suddenly developed swelling in the area. She went back to the urgent care. She has been using Bactroban and Steri-Strips to try and reapplied approximate things ever since. She does have atrial fibrillation on anticoagulants. Past medical history includes paroxysmal atrial fibrillation, multiple skin problems followed by Dr. Nehemiah Massed at [facility] including seborrheic keratosis melanotic nevi hemangiomas and actinic damage secondary to chronic UV exposure. She also has a history of squamous cell carcinoma of the skin and has been treated with radiation to her legs at one point as well as I think phototherapy. She also has hyperlipidemia. ABI in our clinic was 1.19 on the left 11/08/2020 upon evaluation today patient appears to be doing well with regard to her wound on her leg. This is showing signs of good improvement which is great news and overall there does not appear to be any signs of active infection at this time which is great news. Obviously this is dramatically improved even compared to last week when Dr. Dellia Nims saw her. 4/27; left anterior leg wound trauma in  the setting of chronic venous insufficiency and likely solar skin damage. In general this is better than last time I saw this. Healthy looking granulation. There is a small tunnel from about 11-12 o'clock at 0.4 cm. Using silver algia Electronic Signature(s) Signed: 11/16/2020 5:04:28 PM By: Linton Ham MD Entered By: Linton Ham on 11/16/2020 09:03:37 [name] ([id number]) -------------------------------------------------------------------------------- Physical Exam Details Patient Name: [name] Date of Service: 11/16/2020 8:30 AM Medical Record Number: [id number] Patient Account Number: [id number] Date of Birth/Sex: 07/19/1946 (75 y.o. F) Treating RN: Cornell Barman Primary Care Provider: Harold Barban Other Clinician: Jeanine Luz Referring Provider: Harold Barban Treating Provider/Extender: Tito Dine in Treatment: 2 Constitutional Sitting or standing Blood Pressure is within target range for patient.. Pulse regular and within target range for patient.Marland Kitchen Respirations regular, non- labored and within target range.. Temperature is normal and within the target range for the patient.Marland Kitchen appears in no distress. Notes Wound exam; good granulation. Debrided with saline and gauze cleans up quite nicely. Tunnel at 10-12 o'clock at 0.4 cm. There is no deep structures here. No evidence of surrounding infection. Mild hypergranulation. Edema control is good. No evidence of surrounding infection Electronic Signature(s) Signed: 11/16/2020 5:04:28 PM By: Linton Ham MD Entered By: Linton Ham on 11/16/2020 09:04:35 [name] ([id number]) -------------------------------------------------------------------------------- Physician Orders Details Patient Name: [name] Date of Service: 11/16/2020 8:30 AM Medical Record Number: [id number] Patient Account Number: [id number] Date of Birth/Sex: Mar 03, 1946 (75 y.o. F) Treating RN: Cornell Barman Primary  Care Provider: Harold Barban Other Clinician: Jeanine Luz Referring Provider: Harold Barban Treating Provider/Extender: Tito Dine in Treatment: 2 Verbal / Phone Orders: No  Diagnosis Coding Follow-up Appointments o Return Appointment in 1 week. Bathing/ Shower/ Hygiene o May shower with wound dressing protected with water repellent cover or cast protector. Edema Control - Lymphedema / Segmental Compressive Device / Other Left Lower Extremity o Elevate, Exercise Daily and Avoid Standing for Long Periods of Time. o Elevate legs to the level of the heart and pump ankles as often as possible o Elevate leg(s) parallel to the floor when sitting. Wound Treatment Wound #1 - Lower Leg Wound Laterality: Left, Midline Cleanser: Soap and Water 1 x Per Week/30 Days Discharge Instructions: Gently cleanse wound with antibacterial soap, rinse and pat dry prior to dressing wounds Primary Dressing: Silvercel Small 2x2 (in/in) 1 x Per Week/30 Days Discharge Instructions: Apply Silvercel Small 2x2 (in/in) as instructed, tuck in undermining-leave a "tail" to be able to pull out Secondary Dressing: Xtrasorb Medium 4x5 (in/in) 1 x Per Week/30 Days Discharge Instructions: Apply to wound as directed. Do not cut. Compression Wrap: Profore Lite LF 3 Multilayer Compression Bandaging System 1 x Per Week/30 Days Discharge Instructions: Apply 3 multi-layer wrap as prescribed. Electronic Signature(s) Signed: 11/16/2020 4:08:37 PM By: Gretta Cool, BSN, RN, CWS, Kim RN, BSN Signed: 11/16/2020 5:04:28 PM By: Linton Ham MD Entered By: Gretta Cool, BSN, RN, CWS, Kim on 11/16/2020 09:00:57 [name] ([id number]) -------------------------------------------------------------------------------- Problem List Details Patient Name: [name], [name] Date of Service: 11/16/2020 8:30 AM Medical Record Number: [id number] Patient Account Number: [id number] Date of Birth/Sex: [birth year] (75 y.o.  F) Treating RN: Cornell Barman Primary Care Provider: Harold Barban Other Clinician: Jeanine Luz Referring Provider: Harold Barban Treating Provider/Extender: Tito Dine in Treatment: 2 Active Problems ICD-10 Encounter Code Description Active Date MDM Diagnosis S80.12XD Contusion of left lower leg, subsequent encounter 11/02/2020 No Yes L97.828 Non-pressure chronic ulcer of other part of left lower leg with other 11/02/2020 No Yes specified severity Inactive Problems Resolved Problems Electronic Signature(s) Signed: 11/16/2020 5:04:28 PM By: Linton Ham MD Entered By: Linton Ham on 11/16/2020 09:01:33 [name] ([id number]) -------------------------------------------------------------------------------- Progress Note Details Patient Name: [name] Date of Service: 11/16/2020 8:30 AM Medical Record Number: [id number] Patient Account Number: [id number] Date of Birth/Sex: 1945/09/06 (75 y.o. F) Treating RN: Cornell Barman Primary Care Provider: Harold Barban Other Clinician: Jeanine Luz Referring Provider: Harold Barban Treating Provider/Extender: Tito Dine in Treatment: 2 Subjective History of Present Illness (HPI) ADMISSION 11/02/2020 This is a 75 year old woman who spent 3 months in [place] just returning yesterday. On March 7 she was picking oranges for the need he had a fall and developed a skin tear on her left lower leg she is also able to show me pictures. She was seen in ER or urgent care. They tried to replace the skin with Steri-Strips. She suddenly developed swelling in the area. She went back to the urgent care. She has been using Bactroban and Steri-Strips to try and reapplied approximate things ever since. She does have atrial fibrillation on anticoagulants. Past medical history includes paroxysmal atrial fibrillation, multiple skin problems followed by Dr. Nehemiah Massed at [facility] including  seborrheic keratosis melanotic nevi hemangiomas and actinic damage secondary to chronic UV exposure. She also has a history of squamous cell carcinoma of the skin and has been treated with radiation to her legs at one point as well as I think phototherapy. She also has hyperlipidemia. ABI in our clinic was 1.19 on the left 11/08/2020 upon evaluation today patient appears to be doing well with regard to her wound on her leg.  This is showing signs of good improvement which is great news and overall there does not appear to be any signs of active infection at this time which is great news. Obviously this is dramatically improved even compared to last week when Dr. Dellia Nims saw her. 4/27; left anterior leg wound trauma in the setting of chronic venous insufficiency and likely solar skin damage. In general this is better than last time I saw this. Healthy looking granulation. There is a small tunnel from about 11-12 o'clock at 0.4 cm. Using silver algia Objective Constitutional Sitting or standing Blood Pressure is within target range for patient.. Pulse regular and within target range for patient.Marland Kitchen Respirations regular, non- labored and within target range.. Temperature is normal and within the target range for the patient.Marland Kitchen appears in no distress. Vitals Time Taken: 8:37 AM, Height: 68 in, Weight: 149 lbs, BMI: 22.7, Temperature: 98.2 F, Pulse: 61 bpm, Respiratory Rate: 16 breaths/min, Blood Pressure: 101/54 mmHg. General Notes: Wound exam; good granulation. Debrided with saline and gauze cleans up quite nicely. Tunnel at 10-12 o'clock at 0.4 cm. There is no deep structures here. No evidence of surrounding infection. Mild hypergranulation. Edema control is good. No evidence of surrounding infection Integumentary (Hair, Skin) Wound #1 status is Open. Original cause of wound was Trauma. The date acquired was: 09/26/2020. The wound has been in treatment 2 weeks. The wound is located on the Left,Midline  Lower Leg. The wound measures 1.3cm length x 2.4cm width x 0.4cm depth; 2.45cm^2 area and 0.98cm^3 volume. There is Fat Layer (Subcutaneous Tissue) exposed. There is no tunneling noted, however, there is undermining starting at 11:00 and ending at 12:00 with a maximum distance of 0.4cm. There is a medium amount of serosanguineous drainage noted. There is large (67- 100%) red granulation within the wound bed. There is no necrotic tissue within the wound bed. Assessment Active Problems ICD-10 Contusion of left lower leg, subsequent encounter Non-pressure chronic ulcer of other part of left lower leg with other specified severity [name], [name] ([id number]) Procedures Wound #1 Pre-procedure diagnosis of Wound #1 is a Trauma, Other located on the Left,Midline Lower Leg . There was a Three Layer Compression Therapy Procedure with a pre-treatment ABI of 1.2 by Cornell Barman, RN. Post procedure Diagnosis Wound #1: Same as Pre-Procedure Plan Follow-up Appointments: Return Appointment in 1 week. Bathing/ Shower/ Hygiene: May shower with wound dressing protected with water repellent cover or cast protector. Edema Control - Lymphedema / Segmental Compressive Device / Other: Elevate, Exercise Daily and Avoid Standing for Long Periods of Time. Elevate legs to the level of the heart and pump ankles as often as possible Elevate leg(s) parallel to the floor when sitting. WOUND #1: - Lower Leg Wound Laterality: Left, Midline Cleanser: Soap and Water 1 x Per Week/30 Days Discharge Instructions: Gently cleanse wound with antibacterial soap, rinse and pat dry prior to dressing wounds Primary Dressing: Silvercel Small 2x2 (in/in) 1 x Per Week/30 Days Discharge Instructions: Apply Silvercel Small 2x2 (in/in) as instructed, tuck in undermining-leave a "tail" to be able to pull out Secondary Dressing: Xtrasorb Medium 4x5 (in/in) 1 x Per Week/30 Days Discharge Instructions: Apply to wound as directed. Do not  cut. Compression Wrap: Profore Lite LF 3 Multilayer Compression Bandaging System 1 x Per Week/30 Days Discharge Instructions: Apply 3 multi-layer wrap as prescribed. 1. I have continued with the silver alginate, Xtrasorb and 3 layer compression. The Lauraine Rinne was to deal with some drainage. This was not so obvious this week 2.  We are changing this weekly 3. Patient is going to [facility] this week although she will be back for her appointment next week 4. Cautioned to keep her leg out of water Electronic Signature(s) Signed: 11/16/2020 5:04:28 PM By: Linton Ham MD Entered By: Linton Ham on 11/16/2020 09:05:33 [name] ([id number]) -------------------------------------------------------------------------------- [place] Details Patient Name: [name] Date of Service: 11/16/2020 Medical Record Number: [id number] Patient Account Number: [id number] Date of Birth/Sex: Mar 15, 1946 (75 y.o. F) Treating RN: Cornell Barman Primary Care Provider: Harold Barban Other Clinician: Jeanine Luz Referring Provider: Harold Barban Treating Provider/Extender: Tito Dine in Treatment: 2 Diagnosis Coding ICD-10 Codes Code Description S80.12XD Contusion of left lower leg, subsequent encounter L97.828 Non-pressure chronic ulcer of other part of left lower leg with other specified severity Facility Procedures CPT4 Code: [id number] Description: (Facility Use Only) [phone] - [facility] LT LEG Modifier: Quantity: 1 Physician Procedures CPT4 Code: [id number] Description: 15056 - WC PHYS LEVEL 3 - EST PT Modifier: Quantity: 1 CPT4 Code: Description: ICD-10 Diagnosis Description L97.828 Non-pressure chronic ulcer of other part of left lower leg with other spe S80.12XD Contusion of left lower leg, subsequent encounter Modifier: cified severity Quantity: Electronic Signature(s) Signed: 11/16/2020 5:04:28 PM By: Linton Ham MD Entered By: Linton Ham on 11/16/2020 09:05:52

## 2020-11-16 NOTE — Progress Notes (Signed)
[name], [name] ([id number]) Visit Report for 11/16/2020 Arrival Information Details Patient Name: [name], [name]. Date of Service: 11/16/2020 8:30 AM Medical Record Number: [id number] Patient Account Number: [id number] Date of Birth/Sex: July 15, 1946 (75 y.o. F) Treating RN: Donnamarie Poag Primary Care : Harold Barban Other Clinician: Jeanine Luz Referring : Harold Barban Treating /Extender: Tito Dine in Treatment: 2 Visit Information History Since Last Visit Added or deleted any medications: No Patient Arrived: Ambulatory Had a fall or experienced change in No Arrival Time: 08:33 activities of daily living that may affect Accompanied By: self risk of falls: Transfer Assistance: None Hospitalized since last visit: No Patient Identification Verified: Yes Has Dressing in Place as Prescribed: Yes Secondary Verification Process Completed: Yes Pain Present Now: No Patient Requires Transmission-Based No Precautions: Patient Has Alerts: Yes Patient Alerts: Patient on Blood Thinner NOT diabetic XARELTO Electronic Signature(s) Signed: 11/16/2020 2:06:04 PM By: Donnamarie Poag Entered By: Donnamarie Poag on 11/16/2020 08:35:30 [name] ([id number]) -------------------------------------------------------------------------------- Compression Therapy Details Patient Name: [name] Date of Service: 11/16/2020 8:30 AM Medical Record Number: [id number] Patient Account Number: [id number] Date of Birth/Sex: 1946-01-03 (75 y.o. F) Treating RN: Cornell Barman Primary Care : Harold Barban Other Clinician: Jeanine Luz Referring : Harold Barban Treating /Extender: Tito Dine in Treatment: 2 Compression Therapy Performed for Wound Assessment: Wound #1 Left,Midline Lower Leg Performed By: Clinician Cornell Barman, RN Compression Type: Three Layer Pre Treatment ABI: 1.2 Post Procedure Diagnosis Same as  Pre-procedure Electronic Signature(s) Signed: 11/16/2020 4:08:37 PM By: Gretta Cool, BSN, RN, CWS, Kim RN, BSN Entered By: Gretta Cool, BSN, RN, CWS, Kim on 11/16/2020 09:00:10 [name] ([id number]) -------------------------------------------------------------------------------- Encounter Discharge Information Details Patient Name: [name] Date of Service: 11/16/2020 8:30 AM Medical Record Number: [id number] Patient Account Number: [id number] Date of Birth/Sex: 1945-10-31 (75 y.o. F) Treating RN: Cornell Barman Primary Care : Harold Barban Other Clinician: Jeanine Luz Referring : Harold Barban Treating /Extender: Tito Dine in Treatment: 2 Encounter Discharge Information Items Discharge Condition: Stable Ambulatory Status: Ambulatory Discharge Destination: Home Transportation: Private Auto Accompanied By: self Schedule Follow-up Appointment: Yes Clinical Summary of Care: Electronic Signature(s) Signed: 11/16/2020 4:08:37 PM By: Gretta Cool, BSN, RN, CWS, Kim RN, BSN Entered By: Gretta Cool, BSN, RN, CWS, Kim on 11/16/2020 09:02:15 [name] ([id number]) -------------------------------------------------------------------------------- Lower Extremity Assessment Details Patient Name: [name] Date of Service: 11/16/2020 8:30 AM Medical Record Number: [id number] Patient Account Number: [id number] Date of Birth/Sex: 15-Aug-1945 (75 y.o. F) Treating RN: Donnamarie Poag Primary Care : Harold Barban Other Clinician: Jeanine Luz Referring : Harold Barban Treating /Extender: Tito Dine in Treatment: 2 Edema Assessment Assessed: [Left: Yes] [Right: No] Edema: [Left: N] [Right: o] Calf Left: Right: Point of Measurement: 30 cm From Medial Instep 32.5 cm Ankle Left: Right: Point of Measurement: 11 cm From Medial Instep 20 cm Knee To Floor Left: Right: From Medial Instep 39 cm Vascular  Assessment Pulses: Dorsalis Pedis Palpable: [Left:Yes] Electronic Signature(s) Signed: 11/16/2020 2:06:04 PM By: Donnamarie Poag Entered ByDonnamarie Poag on 11/16/2020 08:45:59 [name] ([id number]) -------------------------------------------------------------------------------- Multi Wound Chart Details Patient Name: [name] Date of Service: 11/16/2020 8:30 AM Medical Record Number: [id number] Patient Account Number: [id number] Date of Birth/Sex: 1946-02-21 (75 y.o. F) Treating RN: Cornell Barman Primary Care : Harold Barban Other Clinician: Jeanine Luz Referring : Harold Barban Treating /Extender: Tito Dine in Treatment: 2 Vital Signs Height(in): 68 Pulse(bpm): 61 Weight(lbs): 149 Blood Pressure(mmHg): 101/54 Body Mass Index(BMI):  23 Temperature(F): 98.2 Respiratory Rate(breaths/min): 16 Photos: [N/A:N/A] Wound Location: Left, Midline Lower Leg N/A N/A Wounding Event: Trauma N/A N/A Primary Etiology: Trauma, Other N/A N/A Comorbid History: Cataracts, Arrhythmia, N/A N/A Hypertension, Colitis, Received Chemotherapy Date Acquired: 09/26/2020 N/A N/A Weeks of Treatment: 2 N/A N/A Wound Status: Open N/A N/A Measurements L x W x D (cm) 1.3x2.4x0.4 N/A N/A Area (cm) : 2.45 N/A N/A Volume (cm) : 0.98 N/A N/A % Reduction in Area: 70.30% N/A N/A % Reduction in Volume: 40.60% N/A N/A Starting Position 1 (o'clock): 11 Ending Position 1 (o'clock): 12 Maximum Distance 1 (cm): 0.4 Undermining: Yes N/A N/A Classification: Full Thickness Without Exposed N/A N/A Support Structures Exudate Amount: Medium N/A N/A Exudate Type: Serosanguineous N/A N/A Exudate Color: red, brown N/A N/A Granulation Amount: Large (67-100%) N/A N/A Granulation Quality: Red N/A N/A Necrotic Amount: None Present (0%) N/A N/A Exposed Structures: Fat Layer (Subcutaneous Tissue): N/A N/A Yes Fascia: No Tendon: No Muscle: No Joint: No Bone:  No Epithelialization: Small (1-33%) N/A N/A Procedures Performed: Compression Therapy N/A N/A Treatment Notes Wound #1 (Lower Leg) Wound Laterality: Left, Midline Cleanser [name], [name] ([id number]) Soap and Water Discharge Instruction: Gently cleanse wound with antibacterial soap, rinse and pat dry prior to dressing wounds Peri-Wound Care Topical Primary Dressing Silvercel Small 2x2 (in/in) Discharge Instruction: Apply Silvercel Small 2x2 (in/in) as instructed, tuck in undermining-leave a "tail" to be able to pull out Secondary Dressing Xtrasorb Medium 4x5 (in/in) Discharge Instruction: Apply to wound as directed. Do not cut. Secured With Compression Wrap Profore Lite LF 3 Multilayer Compression Bandaging System Discharge Instruction: Apply 3 multi-layer wrap as prescribed. Compression Stockings Add-Ons Electronic Signature(s) Signed: 11/16/2020 5:04:28 PM By: Linton Ham MD Entered By: Linton Ham on 11/16/2020 09:02:58 [name] ([id number]) -------------------------------------------------------------------------------- [place] Details Patient Name: [name] Date of Service: 11/16/2020 8:30 AM Medical Record Number: [id number] Patient Account Number: [id number] Date of Birth/Sex: February 27, 1946 (75 y.o. F) Treating RN: Cornell Barman Primary Care : Harold Barban Other Clinician: Jeanine Luz Referring : Harold Barban Treating /Extender: Tito Dine in Treatment: 2 Active Inactive Necrotic Tissue Nursing Diagnoses: Impaired tissue integrity related to necrotic/devitalized tissue Goals: Necrotic/devitalized tissue will be minimized in the wound bed Date Initiated: 11/02/2020 Target Resolution Date: 11/02/2020 Goal Status: Active Patient/caregiver will verbalize understanding of reason and process for debridement of necrotic tissue Date Initiated: 11/02/2020 Target Resolution Date:  11/02/2020 Goal Status: Active Interventions: Assess patient pain level pre-, during and post procedure and prior to discharge Provide education on necrotic tissue and debridement process Treatment Activities: Apply topical anesthetic as ordered : 11/02/2020 Excisional debridement : 11/02/2020 Notes: Wound/Skin Impairment Nursing Diagnoses: Impaired tissue integrity Goals: Patient/caregiver will verbalize understanding of skin care regimen Date Initiated: 11/02/2020 Target Resolution Date: 11/02/2020 Goal Status: Active Ulcer/skin breakdown will have a volume reduction of 30% by week 4 Date Initiated: 11/02/2020 Target Resolution Date: 12/02/2020 Goal Status: Active Ulcer/skin breakdown will have a volume reduction of 50% by week 8 Date Initiated: 11/02/2020 Target Resolution Date: 01/02/2021 Goal Status: Active Ulcer/skin breakdown will have a volume reduction of 80% by week 12 Date Initiated: 11/02/2020 Target Resolution Date: 02/01/2021 Goal Status: Active Ulcer/skin breakdown will heal within 14 weeks Date Initiated: 11/02/2020 Target Resolution Date: 03/04/2021 Goal Status: Active Interventions: Assess patient/caregiver ability to obtain necessary supplies Assess patient/caregiver ability to perform ulcer/skin care regimen upon admission and as needed Assess ulceration(s) every visit Provide education on ulcer and skin care Treatment Activities: Referred to  DME  for dressing supplies : 11/02/2020 [name], [name] ([id number]) Skin care regimen initiated : 11/02/2020 Topical wound management initiated : 11/02/2020 Notes: Electronic Signature(s) Signed: 11/16/2020 4:08:37 PM By: Gretta Cool, BSN, RN, CWS, Kim RN, BSN Entered By: Gretta Cool, BSN, RN, CWS, Kim on 11/16/2020 08:59:38 [name] ([id number]) -------------------------------------------------------------------------------- Pain Assessment Details Patient Name: [name] Date of Service: 11/16/2020 8:30 AM Medical  Record Number: [id number] Patient Account Number: [id number] Date of Birth/Sex: Apr 10, 1946 (75 y.o. F) Treating RN: Donnamarie Poag Primary Care : Harold Barban Other Clinician: Jeanine Luz Referring : Harold Barban Treating /Extender: Tito Dine in Treatment: 2 Active Problems Location of Pain Severity and Description of Pain Patient Has Paino No Site Locations Rate the pain. Current Pain Level: 0 Pain Management and Medication Current Pain Management: Electronic Signature(s) Signed: 11/16/2020 2:06:04 PM By: Donnamarie Poag Entered By: Donnamarie Poag on 11/16/2020 08:37:07 [name] ([id number]) -------------------------------------------------------------------------------- Patient/Caregiver Education Details Patient Name: [name] Date of Service: 11/16/2020 8:30 AM Medical Record Number: [id number] Patient Account Number: [id number] Date of Birth/Gender: 1945-11-29 (75 y.o. F) Treating RN: Cornell Barman Primary Care Physician: Harold Barban Other Clinician: Jeanine Luz Referring Physician: Harold Barban Treating Physician/Extender: Tito Dine in Treatment: 2 Education Assessment Education Provided To: Patient Education Topics Provided Wound/Skin Impairment: Handouts: Caring for Your Ulcer Methods: Demonstration, Explain/Verbal Responses: State content correctly Electronic Signature(s) Signed: 11/16/2020 4:08:37 PM By: Gretta Cool, BSN, RN, CWS, Kim RN, BSN Entered By: Gretta Cool, BSN, RN, CWS, Kim on 11/16/2020 09:01:29 [name] ([id number]) -------------------------------------------------------------------------------- Wound Assessment Details Patient Name: [name] Date of Service: 11/16/2020 8:30 AM Medical Record Number: [id number] Patient Account Number: [id number] Date of Birth/Sex: Jun 05, 1946 (75 y.o. F) Treating RN: Donnamarie Poag Primary Care : Harold Barban Other Clinician:  Jeanine Luz Referring : Harold Barban Treating /Extender: Tito Dine in Treatment: 2 Wound Status Wound Number: 1 Primary Trauma, Other Etiology: Wound Location: Left, Midline Lower Leg Wound Status: Open Wounding Event: Trauma Comorbid Cataracts, Arrhythmia, Hypertension, Colitis, Received Date Acquired: 09/26/2020 History: Chemotherapy Weeks Of Treatment: 2 Clustered Wound: No Photos Wound Measurements Length: (cm) 1.3 Width: (cm) 2.4 Depth: (cm) 0.4 Area: (cm) 2.45 Volume: (cm) 0.98 % Reduction in Area: 70.3% % Reduction in Volume: 40.6% Epithelialization: Small (1-33%) Tunneling: No Undermining: Yes Starting Position (o'clock): 11 Ending Position (o'clock): 12 Maximum Distance: (cm) 0.4 Wound Description Classification: Full Thickness Without Exposed Support Structures Exudate Amount: Medium Exudate Type: Serosanguineous Exudate Color: red, brown Foul Odor After Cleansing: No Slough/Fibrino Yes Wound Bed Granulation Amount: Large (67-100%) Exposed Structure Granulation Quality: Red Fascia Exposed: No Necrotic Amount: None Present (0%) Fat Layer (Subcutaneous Tissue) Exposed: Yes Tendon Exposed: No Muscle Exposed: No Joint Exposed: No Bone Exposed: No Treatment Notes Wound #1 (Lower Leg) Wound Laterality: Left, Midline Cleanser Soap and Water [name], [name] ([id number]) Discharge Instruction: Gently cleanse wound with antibacterial soap, rinse and pat dry prior to dressing wounds Peri-Wound Care Topical Primary Dressing Silvercel Small 2x2 (in/in) Discharge Instruction: Apply Silvercel Small 2x2 (in/in) as instructed, tuck in undermining-leave a "tail" to be able to pull out Secondary Dressing Xtrasorb Medium 4x5 (in/in) Discharge Instruction: Apply to wound as directed. Do not cut. Secured With Compression Wrap Profore Lite LF 3 Multilayer Compression Bandaging System Discharge Instruction: Apply 3 multi-layer  wrap as prescribed. Compression Stockings Add-Ons Electronic Signature(s) Signed: 11/16/2020 2:06:04 PM By: Donnamarie Poag Entered By: Donnamarie Poag on 11/16/2020 08:44:23 [name] ([id number]) -------------------------------------------------------------------------------- [place] Details Patient Name:  [name], Heather H. Date of Service: 11/16/2020 8:30 AM Medical Record Number: [id number] Patient Account Number: [id number] Date of Birth/Sex: Oct 20, 1945 (75 y.o. F) Treating RN: Donnamarie Poag Primary Care : Harold Barban Other Clinician: Jeanine Luz Referring : Harold Barban Treating /Extender: Tito Dine in Treatment: 2 Vital Signs Time Taken: 08:37 Temperature (F): 98.2 Height (in): 68 Pulse (bpm): 61 Weight (lbs): 149 Respiratory Rate (breaths/min): 16 Body Mass Index (BMI): 22.7 Blood Pressure (mmHg): 101/54 Reference Range: 80 - 120 mg / dl Electronic Signature(s) Signed: 11/16/2020 2:06:04 PM By: Donnamarie Poag Entered ByDonnamarie Poag on 11/16/2020 08:36:53

## 2020-11-23 ENCOUNTER — Other Ambulatory Visit: Payer: Self-pay

## 2020-11-23 ENCOUNTER — Encounter: Payer: PPO | Attending: Internal Medicine | Admitting: Internal Medicine

## 2020-11-23 DIAGNOSIS — L97822 Non-pressure chronic ulcer of other part of left lower leg with fat layer exposed: Secondary | ICD-10-CM | POA: Diagnosis not present

## 2020-11-23 DIAGNOSIS — S8012XD Contusion of left lower leg, subsequent encounter: Secondary | ICD-10-CM | POA: Insufficient documentation

## 2020-11-23 DIAGNOSIS — Z85828 Personal history of other malignant neoplasm of skin: Secondary | ICD-10-CM | POA: Diagnosis not present

## 2020-11-23 DIAGNOSIS — L97828 Non-pressure chronic ulcer of other part of left lower leg with other specified severity: Secondary | ICD-10-CM | POA: Insufficient documentation

## 2020-11-23 DIAGNOSIS — Z7901 Long term (current) use of anticoagulants: Secondary | ICD-10-CM | POA: Insufficient documentation

## 2020-11-23 DIAGNOSIS — Z923 Personal history of irradiation: Secondary | ICD-10-CM | POA: Diagnosis not present

## 2020-11-23 DIAGNOSIS — W19XXXD Unspecified fall, subsequent encounter: Secondary | ICD-10-CM | POA: Diagnosis not present

## 2020-11-23 DIAGNOSIS — Y9389 Activity, other specified: Secondary | ICD-10-CM | POA: Diagnosis not present

## 2020-11-23 DIAGNOSIS — T8131XS Disruption of external operation (surgical) wound, not elsewhere classified, sequela: Secondary | ICD-10-CM | POA: Insufficient documentation

## 2020-11-23 DIAGNOSIS — Y838 Other surgical procedures as the cause of abnormal reaction of the patient, or of later complication, without mention of misadventure at the time of the procedure: Secondary | ICD-10-CM | POA: Diagnosis not present

## 2020-11-23 NOTE — Progress Notes (Signed)
JADALYN, OLIVERI ([id number]) Visit Report for 11/23/2020 Arrival Information Details Patient Name: [name], [name]. Date of Service: 11/23/2020 10:00 AM Medical Record Number: [id number] Patient Account Number: [id number] Date of Birth/Sex: 21-Mar-1946 (75 y.o. F) Treating RN: Donnamarie Poag Primary Care : Harold Barban Other Clinician: Referring : Harold Barban Treating /Extender: Tito Dine in Treatment: 3 Visit Information History Since Last Visit Added or deleted any medications: No Patient Arrived: Ambulatory Had a fall or experienced change in No Arrival Time: 10:16 activities of daily living that may affect Accompanied By: husband risk of falls: Transfer Assistance: None Hospitalized since last visit: No Patient Requires Transmission-Based No Has Dressing in Place as Prescribed: Yes Precautions: Has Compression in Place as Prescribed: Yes Patient Has Alerts: Yes Pain Present Now: No Patient Alerts: Patient on Blood Thinner NOT diabetic XARELTO Electronic Signature(s) Signed: 11/23/2020 4:20:07 PM By: Donnamarie Poag Entered By: Donnamarie Poag on 11/23/2020 10:16:44 [name] ([id number]) -------------------------------------------------------------------------------- Clinic Level of Care Assessment Details Patient Name: [name] Date of Service: 11/23/2020 10:00 AM Medical Record Number: [id number] Patient Account Number: [id number] Date of Birth/Sex: 09-Jul-1946 (75 y.o. F) Treating RN: Dolan Amen Primary Care : Harold Barban Other Clinician: Referring : Harold Barban Treating /Extender: Tito Dine in Treatment: 3 Clinic Level of Care Assessment Items TOOL 1 Quantity Score '[]'  - Use when EandM and Procedure is performed on INITIAL visit 0 ASSESSMENTS - Nursing Assessment / Reassessment '[]'  - General Physical Exam (combine w/ comprehensive assessment (listed just below) when  performed on new 0 pt. evals) '[]'  - 0 Comprehensive Assessment (HX, ROS, Risk Assessments, Wounds Hx, etc.) ASSESSMENTS - Wound and Skin Assessment / Reassessment '[]'  - Dermatologic / Skin Assessment (not related to wound area) 0 ASSESSMENTS - Ostomy and/or Continence Assessment and Care '[]'  - Incontinence Assessment and Management 0 '[]'  - 0 Ostomy Care Assessment and Management (repouching, etc.) PROCESS - Coordination of Care '[]'  - Simple Patient / Family Education for ongoing care 0 '[]'  - 0 Complex (extensive) Patient / Family Education for ongoing care '[]'  - 0 Staff obtains Programmer, systems, Records, Test Results / Process Orders '[]'  - 0 Staff telephones HHA, Nursing Homes / Clarify orders / etc '[]'  - 0 Routine Transfer to another Facility (non-emergent condition) '[]'  - 0 Routine Hospital Admission (non-emergent condition) '[]'  - 0 New Admissions / Biomedical engineer / Ordering NPWT, Apligraf, etc. '[]'  - 0 Emergency Hospital Admission (emergent condition) PROCESS - Special Needs '[]'  - Pediatric / Minor Patient Management 0 '[]'  - 0 Isolation Patient Management '[]'  - 0 Hearing / Language / Visual special needs '[]'  - 0 Assessment of Community assistance (transportation, D/C planning, etc.) '[]'  - 0 Additional assistance / Altered mentation '[]'  - 0 Support Surface(s) Assessment (bed, cushion, seat, etc.) INTERVENTIONS - Miscellaneous '[]'  - External ear exam 0 '[]'  - 0 Patient Transfer (multiple staff / Civil Service fast streamer / Similar devices) '[]'  - 0 Simple Staple / Suture removal (25 or less) '[]'  - 0 Complex Staple / Suture removal (26 or more) '[]'  - 0 Hypo/Hyperglycemic Management (do not check if billed separately) '[]'  - 0 Ankle / Brachial Index (ABI) - do not check if billed separately Has the patient been seen at the hospital within the last three years: Yes Total Score: 0 Level Of Care: ____ [name] ([id number]) Electronic Signature(s) Signed: 11/23/2020 12:09:25 PM By: Georges Mouse,  Minus Breeding RN Entered By: Georges Mouse, Minus Breeding on 11/23/2020 11:02:02 [name] ([id number]) -------------------------------------------------------------------------------- Compression Therapy Details Patient  Name: [name], Heather H. Date of Service: 11/23/2020 10:00 AM Medical Record Number: [id number] Patient Account Number: [id number] Date of Birth/Sex: 1946-02-19 (75 y.o. F) Treating RN: Dolan Amen Primary Care : Harold Barban Other Clinician: Referring : Harold Barban Treating /Extender: Tito Dine in Treatment: 3 Compression Therapy Performed for Wound Assessment: Wound #1 Left,Midline Lower Leg Performed By: Clinician Dolan Amen, RN Compression Type: Three Layer Post Procedure Diagnosis Same as Pre-procedure Electronic Signature(s) Signed: 11/23/2020 12:09:25 PM By: Georges Mouse, Minus Breeding RN Entered By: Georges Mouse, Minus Breeding on 11/23/2020 11:02:40 [name] ([id number]) -------------------------------------------------------------------------------- Encounter Discharge Information Details Patient Name: [name] Date of Service: 11/23/2020 10:00 AM Medical Record Number: [id number] Patient Account Number: [id number] Date of Birth/Sex: 1946/06/04 (75 y.o. F) Treating RN: Donnamarie Poag Primary Care : Harold Barban Other Clinician: Referring : Harold Barban Treating /Extender: Tito Dine in Treatment: 3 Encounter Discharge Information Items Post Procedure Vitals Discharge Condition: Stable Temperature (F): 98.5 Ambulatory Status: Ambulatory Pulse (bpm): 77 Discharge Destination: Home Respiratory Rate (breaths/min): 16 Transportation: Private Auto Blood Pressure (mmHg): 119/69 Accompanied By: husband Schedule Follow-up Appointment: Yes Clinical Summary of Care: Electronic Signature(s) Signed: 11/23/2020 4:20:07 PM By: Donnamarie Poag Entered By: Donnamarie Poag on 11/23/2020  11:20:05 [name] ([id number]) -------------------------------------------------------------------------------- Lower Extremity Assessment Details Patient Name: [name] Date of Service: 11/23/2020 10:00 AM Medical Record Number: [id number] Patient Account Number: [id number] Date of Birth/Sex: November 05, 1945 (75 y.o. F) Treating RN: Donnamarie Poag Primary Care : Harold Barban Other Clinician: Referring : Harold Barban Treating /Extender: Tito Dine in Treatment: 3 Edema Assessment Assessed: [Left: Yes] [Right: No] [Left: Edema] [Right: :] Calf Left: Right: Point of Measurement: 30 cm From Medial Instep 33 cm Ankle Left: Right: Point of Measurement: 11 cm From Medial Instep 19.5 cm Vascular Assessment Pulses: Dorsalis Pedis Palpable: [Left:Yes] Electronic Signature(s) Signed: 11/23/2020 4:20:07 PM By: Donnamarie Poag Entered By: Donnamarie Poag on 11/23/2020 10:26:49 [name] ([id number]) -------------------------------------------------------------------------------- Multi Wound Chart Details Patient Name: [name] Date of Service: 11/23/2020 10:00 AM Medical Record Number: [id number] Patient Account Number: [id number] Date of Birth/Sex: 06-17-1946 (75 y.o. F) Treating RN: Dolan Amen Primary Care : Harold Barban Other Clinician: Referring : Harold Barban Treating /Extender: Tito Dine in Treatment: 3 Vital Signs Height(in): 68 Pulse(bpm): 4 Weight(lbs): 149 Blood Pressure(mmHg): 119/69 Body Mass Index(BMI): 23 Temperature(F): 98.5 Respiratory Rate(breaths/min): 16 Photos: [N/A:N/A] Wound Location: Left, Midline Lower Leg N/A N/A Wounding Event: Trauma N/A N/A Primary Etiology: Trauma, Other N/A N/A Comorbid History: Cataracts, Arrhythmia, N/A N/A Hypertension, Colitis, Received Chemotherapy Date Acquired: 09/26/2020 N/A N/A Weeks of Treatment: 3 N/A N/A Wound  Status: Open N/A N/A Measurements L x W x D (cm) 0.9x1.2x0.1 N/A N/A Area (cm) : 0.848 N/A N/A Volume (cm) : 0.085 N/A N/A % Reduction in Area: 89.70% N/A N/A % Reduction in Volume: 94.80% N/A N/A Classification: Full Thickness Without Exposed N/A N/A Support Structures Exudate Amount: Medium N/A N/A Exudate Type: Serosanguineous N/A N/A Exudate Color: red, brown N/A N/A Granulation Amount: Medium (34-66%) N/A N/A Granulation Quality: Red N/A N/A Necrotic Amount: Medium (34-66%) N/A N/A Necrotic Tissue: Eschar N/A N/A Exposed Structures: Fat Layer (Subcutaneous Tissue): N/A N/A Yes Fascia: No Tendon: No Muscle: No Joint: No Bone: No Epithelialization: Small (1-33%) N/A N/A Debridement: N/A N/A [name], [name] ([id number]) Debridement - Selective/Open Wound Pre-procedure Verification/Time 11:00 N/A N/A Out Taken: Level: Skin/Epidermis N/A N/A Debridement Area (sq cm): 1.08 N/A N/A Instrument: Curette N/A  N/A Bleeding: Minimum N/A N/A Hemostasis Achieved: Pressure N/A N/A Debridement Treatment Procedure was tolerated well N/A N/A Response: Post Debridement 0.9x1.2x0.1 N/A N/A Measurements L x W x D (cm) Post Debridement Volume: 0.085 N/A N/A (cm) Procedures Performed: Compression Therapy N/A N/A Debridement Treatment Notes Wound #1 (Lower Leg) Wound Laterality: Left, Midline Cleanser Soap and Water Discharge Instruction: Gently cleanse wound with antibacterial soap, rinse and pat dry prior to dressing wounds Peri-Wound Care Topical Primary Dressing Silvercel Small 2x2 (in/in) Discharge Instruction: Apply Silvercel Small 2x2 (in/in) as instructed Secondary Dressing Xtrasorb Medium 4x5 (in/in) Discharge Instruction: Apply to wound as directed. Do not cut. Secured With Compression Wrap Profore Lite LF 3 Multilayer Compression Bandaging System Discharge Instruction: Apply 3 multi-layer wrap as prescribed. Compression Stockings Add-Ons Electronic  Signature(s) Signed: 11/23/2020 4:35:29 PM By: Linton Ham MD Entered By: Linton Ham on 11/23/2020 11:31:58 [name] ([id number]) -------------------------------------------------------------------------------- [place] Details Patient Name: [name] Date of Service: 11/23/2020 10:00 AM Medical Record Number: [id number] Patient Account Number: [id number] Date of Birth/Sex: [birth year] (75 y.o. F) Treating RN: Dolan Amen Primary Care : Harold Barban Other Clinician: Referring : Harold Barban Treating /Extender: Tito Dine in Treatment: 3 Active Inactive Necrotic Tissue Nursing Diagnoses: Impaired tissue integrity related to necrotic/devitalized tissue Goals: Necrotic/devitalized tissue will be minimized in the wound bed Date Initiated: 11/02/2020 Target Resolution Date: 11/02/2020 Goal Status: Active Patient/caregiver will verbalize understanding of reason and process for debridement of necrotic tissue Date Initiated: 11/02/2020 Target Resolution Date: 11/02/2020 Goal Status: Active Interventions: Assess patient pain level pre-, during and post procedure and prior to discharge Provide education on necrotic tissue and debridement process Treatment Activities: Apply topical anesthetic as ordered : 11/02/2020 Excisional debridement : 11/02/2020 Notes: Wound/Skin Impairment Nursing Diagnoses: Impaired tissue integrity Goals: Patient/caregiver will verbalize understanding of skin care regimen Date Initiated: 11/02/2020 Date Inactivated: 11/23/2020 Target Resolution Date: 11/02/2020 Goal Status: Met Ulcer/skin breakdown will have a volume reduction of 30% by week 4 Date Initiated: 11/02/2020 Target Resolution Date: 12/02/2020 Goal Status: Active Ulcer/skin breakdown will have a volume reduction of 50% by week 8 Date Initiated: 11/02/2020 Target Resolution Date: 01/02/2021 Goal Status: Active Ulcer/skin  breakdown will have a volume reduction of 80% by week 12 Date Initiated: 11/02/2020 Target Resolution Date: 02/01/2021 Goal Status: Active Ulcer/skin breakdown will heal within 14 weeks Date Initiated: 11/02/2020 Target Resolution Date: 03/04/2021 Goal Status: Active Interventions: Assess patient/caregiver ability to obtain necessary supplies Assess patient/caregiver ability to perform ulcer/skin care regimen upon admission and as needed Assess ulceration(s) every visit Provide education on ulcer and skin care Treatment Activities: Referred to DME  for dressing supplies : 11/02/2020 [name], [name] ([id number]) Skin care regimen initiated : 11/02/2020 Topical wound management initiated : 11/02/2020 Notes: Electronic Signature(s) Signed: 11/23/2020 12:09:25 PM By: Georges Mouse, Minus Breeding RN Entered By: Georges Mouse, Minus Breeding on 11/23/2020 11:00:24 [name] ([id number]) -------------------------------------------------------------------------------- Pain Assessment Details Patient Name: [name] Date of Service: 11/23/2020 10:00 AM Medical Record Number: [id number] Patient Account Number: [id number] Date of Birth/Sex: [birth year] (75 y.o. F) Treating RN: Donnamarie Poag Primary Care : Harold Barban Other Clinician: Referring : Harold Barban Treating /Extender: Tito Dine in Treatment: 3 Active Problems Location of Pain Severity and Description of Pain Patient Has Paino No Site Locations Rate the pain. Current Pain Level: 0 Pain Management and Medication Current Pain Management: Electronic Signature(s) Signed: 11/23/2020 4:20:07 PM By: Donnamarie Poag Entered By: Donnamarie Poag on 11/23/2020 10:18:40 [name], Heather H. (  [id number]) -------------------------------------------------------------------------------- Patient/Caregiver Education Details Patient Name: [name], BARDON. Date of Service: 11/23/2020 10:00 AM Medical Record Number:  [id number] Patient Account Number: [id number] Date of Birth/Gender: [birth year] (74 y.o. F) Treating RN: Dolan Amen Primary Care Physician: Harold Barban Other Clinician: Referring Physician: Harold Barban Treating Physician/Extender: Tito Dine in Treatment: 3 Education Assessment Education Provided To: Patient Education Topics Provided Wound/Skin Impairment: Methods: Explain/Verbal Responses: State content correctly Electronic Signature(s) Signed: 11/23/2020 12:09:25 PM By: Georges Mouse, Minus Breeding RN Entered By: Georges Mouse, Minus Breeding on 11/23/2020 11:02:17 [name] ([id number]) -------------------------------------------------------------------------------- Wound Assessment Details Patient Name: [name] Date of Service: 11/23/2020 10:00 AM Medical Record Number: [id number] Patient Account Number: [id number] Date of Birth/Sex: Aug 23, 1945 (75 y.o. F) Treating RN: Donnamarie Poag Primary Care : Harold Barban Other Clinician: Referring : Harold Barban Treating /Extender: Tito Dine in Treatment: 3 Wound Status Wound Number: 1 Primary Trauma, Other Etiology: Wound Location: Left, Midline Lower Leg Wound Status: Open Wounding Event: Trauma Comorbid Cataracts, Arrhythmia, Hypertension, Colitis, Received Date Acquired: 09/26/2020 History: Chemotherapy Weeks Of Treatment: 3 Clustered Wound: No Photos Wound Measurements Length: (cm) 0.9 Width: (cm) 1.2 Depth: (cm) 0.1 Area: (cm) 0.848 Volume: (cm) 0.085 % Reduction in Area: 89.7% % Reduction in Volume: 94.8% Epithelialization: Small (1-33%) Tunneling: No Undermining: No Wound Description Classification: Full Thickness Without Exposed Support Structures Exudate Amount: Medium Exudate Type: Serosanguineous Exudate Color: red, brown Foul Odor After Cleansing: No Slough/Fibrino Yes Wound Bed Granulation Amount: Medium (34-66%) Exposed  Structure Granulation Quality: Red Fascia Exposed: No Necrotic Amount: Medium (34-66%) Fat Layer (Subcutaneous Tissue) Exposed: Yes Necrotic Quality: Eschar Tendon Exposed: No Muscle Exposed: No Joint Exposed: No Bone Exposed: No Treatment Notes Wound #1 (Lower Leg) Wound Laterality: Left, Midline Cleanser Soap and Water Discharge Instruction: Gently cleanse wound with antibacterial soap, rinse and pat dry prior to dressing wounds Peri-Wound Care [name], [name] ([id number]) Topical Primary Dressing Silvercel Small 2x2 (in/in) Discharge Instruction: Apply Silvercel Small 2x2 (in/in) as instructed Secondary Dressing Xtrasorb Medium 4x5 (in/in) Discharge Instruction: Apply to wound as directed. Do not cut. Secured With Compression Wrap Profore Lite LF 3 Multilayer Compression Bandaging System Discharge Instruction: Apply 3 multi-layer wrap as prescribed. Compression Stockings Add-Ons Electronic Signature(s) Signed: 11/23/2020 4:20:07 PM By: Donnamarie Poag Entered By: Donnamarie Poag on 11/23/2020 10:25:24 [name] ([id number]) -------------------------------------------------------------------------------- Vitals Details Patient Name: [name] Date of Service: 11/23/2020 10:00 AM Medical Record Number: [id number] Patient Account Number: [id number] Date of Birth/Sex: 11/13/1945 (75 y.o. F) Treating RN: Donnamarie Poag Primary Care : Harold Barban Other Clinician: Referring : Harold Barban Treating /Extender: Tito Dine in Treatment: 3 Vital Signs Time Taken: 10:19 Temperature (F): 98.5 Height (in): 68 Pulse (bpm): 77 Weight (lbs): 149 Respiratory Rate (breaths/min): 16 Body Mass Index (BMI): 22.7 Blood Pressure (mmHg): 119/69 Reference Range: 80 - 120 mg / dl Electronic Signature(s) Signed: 11/23/2020 4:20:07 PM By: Donnamarie Poag Entered ByDonnamarie Poag on 11/23/2020 10:18:27

## 2020-11-24 NOTE — Progress Notes (Signed)
[name], [name] ([id number]) Visit Report for 11/23/2020 Debridement Details Patient Name: [name], [name]. Date of Service: 11/23/2020 10:00 AM Medical Record Number: [id number] Patient Account Number: [id number] Date of Birth/Sex: January 24, 1946 (75 y.o. F) Treating RN: Cornell Barman Primary Care Provider: Harold Barban Other Clinician: Referring Provider: Harold Barban Treating Provider/Extender: Tito Dine in Treatment: 3 Debridement Performed for Wound #1 Left,Midline Lower Leg Assessment: Performed By: Physician Ricard Dillon, MD Debridement Type: Debridement Level of Consciousness (Pre- Awake and Alert procedure): Pre-procedure Verification/Time Out Yes - 11:00 Taken: Start Time: 11:00 Total Area Debrided (L x W): 0.9 (cm) x 1.2 (cm) = 1.08 (cm) Tissue and other material Non-Viable, Skin: Epidermis debrided: Level: Skin/Epidermis Debridement Description: Selective/Open Wound Instrument: Curette Bleeding: Minimum Hemostasis Achieved: Pressure Response to Treatment: Procedure was tolerated well Level of Consciousness (Post- Awake and Alert procedure): Post Debridement Measurements of Total Wound Length: (cm) 0.9 Width: (cm) 1.2 Depth: (cm) 0.1 Volume: (cm) 0.085 Character of Wound/Ulcer Post Debridement: Stable Post Procedure Diagnosis Same as Pre-procedure Electronic Signature(s) Signed: 11/23/2020 4:35:29 PM By: Linton Ham MD Signed: 11/23/2020 6:13:14 PM By: Gretta Cool, BSN, RN, CWS, Kim RN, BSN Entered By: Linton Ham on 11/23/2020 11:32:06 [name] ([id number]) -------------------------------------------------------------------------------- HPI Details Patient Name: [name] Date of Service: 11/23/2020 10:00 AM Medical Record Number: [id number] Patient Account Number: [id number] Date of Birth/Sex: 06/11/1946 (75 y.o. F) Treating RN: Cornell Barman Primary Care Provider: Harold Barban Other Clinician: Referring Provider:  Harold Barban Treating Provider/Extender: Tito Dine in Treatment: 3 History of Present Illness HPI Description: ADMISSION 11/02/2020 This is a 75 year old woman who spent 3 months in [place] just returning yesterday. On March 7 she was picking oranges for the need he had a fall and developed a skin tear on her left lower leg she is also able to show me pictures. She was seen in ER or urgent care. They tried to replace the skin with Steri-Strips. She suddenly developed swelling in the area. She went back to the urgent care. She has been using Bactroban and Steri-Strips to try and reapplied approximate things ever since. She does have atrial fibrillation on anticoagulants. Past medical history includes paroxysmal atrial fibrillation, multiple skin problems followed by Dr. Nehemiah Massed at [facility] including seborrheic keratosis melanotic nevi hemangiomas and actinic damage secondary to chronic UV exposure. She also has a history of squamous cell carcinoma of the skin and has been treated with radiation to her legs at one point as well as I think phototherapy. She also has hyperlipidemia. ABI in our clinic was 1.19 on the left 11/08/2020 upon evaluation today patient appears to be doing well with regard to her wound on her leg. This is showing signs of good improvement which is great news and overall there does not appear to be any signs of active infection at this time which is great news. Obviously this is dramatically improved even compared to last week when Dr. Dellia Nims saw her. 4/27; left anterior leg wound trauma in the setting of chronic venous insufficiency and likely solar skin damage. In general this is better than last time I saw this. Healthy looking granulation. There is a small tunnel from about 11-12 o'clock at 0.4 cm. Using silver algia 5/4; left anterior lower leg wound looks a lot better. This is gradually closing over. There is some eschar and flaking skin around  the edges of this wound. She shows me a small raised area in the anterior midline more towards the ankle. She  also tells me she has a history of squamous cell skin cancer Electronic Signature(s) Signed: 11/23/2020 4:35:29 PM By: Linton Ham MD Entered By: Linton Ham on 11/23/2020 11:33:17 [name] ([id number]) -------------------------------------------------------------------------------- Physical Exam Details Patient Name: [name] Date of Service: 11/23/2020 10:00 AM Medical Record Number: [id number] Patient Account Number: [id number] Date of Birth/Sex: [birth year] (75 y.o. F) Treating RN: Cornell Barman Primary Care Provider: Harold Barban Other Clinician: Referring Provider: Harold Barban Treating Provider/Extender: Tito Dine in Treatment: 3 Constitutional Sitting or standing Blood Pressure is within target range for patient.. Pulse regular and within target range for patient.Marland Kitchen Respirations regular, non- labored and within target range.. Temperature is normal and within the target range for the patient.Marland Kitchen appears in no distress. Notes Wound exam; patient has good granulation with surrounding epithelialization. I removed from dry flaking skin from around the wound margins this looks very healthy o Additionally the area she showed me on her left lower anterior leg looks like the possibility of another squamous cell carcinoma. Raised fleshy area with dry flaking skin on top of it. Underneath this there is a small open wound Electronic Signature(s) Signed: 11/23/2020 4:35:29 PM By: Linton Ham MD Entered By: Linton Ham on 11/23/2020 11:36:52 [name] ([id number]) -------------------------------------------------------------------------------- Physician Orders Details Patient Name: [name] Date of Service: 11/23/2020 10:00 AM Medical Record Number: [id number] Patient Account Number: [id number] Date of Birth/Sex: 08-06-1945 (75 y.o.  F) Treating RN: Dolan Amen Primary Care Provider: Harold Barban Other Clinician: Referring Provider: Harold Barban Treating Provider/Extender: Tito Dine in Treatment: 3 Verbal / Phone Orders: No Diagnosis Coding Follow-up Appointments o Return Appointment in 1 week. Bathing/ Shower/ Hygiene o May shower with wound dressing protected with water repellent cover or cast protector. Edema Control - Lymphedema / Segmental Compressive Device / Other Left Lower Extremity o Elevate, Exercise Daily and Avoid Standing for Long Periods of Time. o Elevate legs to the level of the heart and pump ankles as often as possible o Elevate leg(s) parallel to the floor when sitting. Wound Treatment Wound #1 - Lower Leg Wound Laterality: Left, Midline Cleanser: Soap and Water 1 x Per Week/30 Days Discharge Instructions: Gently cleanse wound with antibacterial soap, rinse and pat dry prior to dressing wounds Primary Dressing: Silvercel Small 2x2 (in/in) 1 x Per Week/30 Days Discharge Instructions: Apply Silvercel Small 2x2 (in/in) as instructed Secondary Dressing: Xtrasorb Medium 4x5 (in/in) 1 x Per Week/30 Days Discharge Instructions: Apply to wound as directed. Do not cut. Compression Wrap: Profore Lite LF 3 Multilayer Compression Bandaging System 1 x Per Week/30 Days Discharge Instructions: Apply 3 multi-layer wrap as prescribed. Electronic Signature(s) Signed: 11/23/2020 12:09:25 PM By: Georges Mouse, Minus Breeding RN Signed: 11/23/2020 4:35:29 PM By: Linton Ham MD Entered By: Georges Mouse, Minus Breeding on 11/23/2020 11:03:25 [name] ([id number]) -------------------------------------------------------------------------------- Problem List Details Patient Name: [name] Date of Service: 11/23/2020 10:00 AM Medical Record Number: [id number] Patient Account Number: [id number] Date of Birth/Sex: Nov 02, 1945 (75 y.o. F) Treating RN: Cornell Barman Primary Care Provider:  Harold Barban Other Clinician: Referring Provider: Harold Barban Treating Provider/Extender: Tito Dine in Treatment: 3 Active Problems ICD-10 Encounter Code Description Active Date MDM Diagnosis S80.12XD Contusion of left lower leg, subsequent encounter 11/02/2020 No Yes L97.828 Non-pressure chronic ulcer of other part of left lower leg with other 11/02/2020 No Yes specified severity Inactive Problems Resolved Problems Electronic Signature(s) Signed: 11/23/2020 4:35:29 PM By: Linton Ham MD Entered By: Linton Ham on 11/23/2020 11:31:49  [name], [name] ([id number]) -------------------------------------------------------------------------------- Progress Note Details Patient Name: [name], [name]. Date of Service: 11/23/2020 10:00 AM Medical Record Number: [id number] Patient Account Number: [id number] Date of Birth/Sex: 07-13-1946 (75 y.o. F) Treating RN: Cornell Barman Primary Care Provider: Harold Barban Other Clinician: Referring Provider: Harold Barban Treating Provider/Extender: Tito Dine in Treatment: 3 Subjective History of Present Illness (HPI) ADMISSION 11/02/2020 This is a 75 year old woman who spent 3 months in [place] just returning yesterday. On March 7 she was picking oranges for the need he had a fall and developed a skin tear on her left lower leg she is also able to show me pictures. She was seen in ER or urgent care. They tried to replace the skin with Steri-Strips. She suddenly developed swelling in the area. She went back to the urgent care. She has been using Bactroban and Steri-Strips to try and reapplied approximate things ever since. She does have atrial fibrillation on anticoagulants. Past medical history includes paroxysmal atrial fibrillation, multiple skin problems followed by Dr. Nehemiah Massed at [facility] including seborrheic keratosis melanotic nevi hemangiomas and actinic damage secondary to chronic UV  exposure. She also has a history of squamous cell carcinoma of the skin and has been treated with radiation to her legs at one point as well as I think phototherapy. She also has hyperlipidemia. ABI in our clinic was 1.19 on the left 11/08/2020 upon evaluation today patient appears to be doing well with regard to her wound on her leg. This is showing signs of good improvement which is great news and overall there does not appear to be any signs of active infection at this time which is great news. Obviously this is dramatically improved even compared to last week when Dr. Dellia Nims saw her. 4/27; left anterior leg wound trauma in the setting of chronic venous insufficiency and likely solar skin damage. In general this is better than last time I saw this. Healthy looking granulation. There is a small tunnel from about 11-12 o'clock at 0.4 cm. Using silver algia 5/4; left anterior lower leg wound looks a lot better. This is gradually closing over. There is some eschar and flaking skin around the edges of this wound. She shows me a small raised area in the anterior midline more towards the ankle. She also tells me she has a history of squamous cell skin cancer Objective Constitutional Sitting or standing Blood Pressure is within target range for patient.. Pulse regular and within target range for patient.Marland Kitchen Respirations regular, non- labored and within target range.. Temperature is normal and within the target range for the patient.Marland Kitchen appears in no distress. Vitals Time Taken: 10:19 AM, Height: 68 in, Weight: 149 lbs, BMI: 22.7, Temperature: 98.5 F, Pulse: 77 bpm, Respiratory Rate: 16 breaths/min, Blood Pressure: 119/69 mmHg. General Notes: Wound exam; patient has good granulation with surrounding epithelialization. I removed from dry flaking skin from around the wound margins this looks very healthy Additionally the area she showed me on her left lower anterior leg looks like the possibility of  another squamous cell carcinoma. Raised fleshy area with dry flaking skin on top of it. Underneath this there is a small open wound Integumentary (Hair, Skin) Wound #1 status is Open. Original cause of wound was Trauma. The date acquired was: 09/26/2020. The wound has been in treatment 3 weeks. The wound is located on the Left,Midline Lower Leg. The wound measures 0.9cm length x 1.2cm width x 0.1cm depth; 0.848cm^2 area and 0.085cm^3 volume. There is Fat Layer (  Subcutaneous Tissue) exposed. There is no tunneling or undermining noted. There is a medium amount of serosanguineous drainage noted. There is medium (34-66%) red granulation within the wound bed. There is a medium (34-66%) amount of necrotic tissue within the wound bed including Eschar. Assessment Active Problems ICD-10 Contusion of left lower leg, subsequent encounter [name], [name]. ([id number]) Non-pressure chronic ulcer of other part of left lower leg with other specified severity Procedures Wound #1 Pre-procedure diagnosis of Wound #1 is a Trauma, Other located on the Left,Midline Lower Leg . There was a Selective/Open Wound Skin/Epidermis Debridement with a total area of 1.08 sq cm performed by Ricard Dillon, MD. With the following instrument(s): Curette to remove Non-Viable tissue/material. Material removed includes Skin: Epidermis. A time out was conducted at 11:00, prior to the start of the procedure. A Minimum amount of bleeding was controlled with Pressure. The procedure was tolerated well. Post Debridement Measurements: 0.9cm length x 1.2cm width x 0.1cm depth; 0.085cm^3 volume. Character of Wound/Ulcer Post Debridement is stable. Post procedure Diagnosis Wound #1: Same as Pre-Procedure Pre-procedure diagnosis of Wound #1 is a Trauma, Other located on the Left,Midline Lower Leg . There was a Three Layer Compression Therapy Procedure by Dolan Amen, RN. Post procedure Diagnosis Wound #1: Same as  Pre-Procedure Plan Follow-up Appointments: Return Appointment in 1 week. Bathing/ Shower/ Hygiene: May shower with wound dressing protected with water repellent cover or cast protector. Edema Control - Lymphedema / Segmental Compressive Device / Other: Elevate, Exercise Daily and Avoid Standing for Long Periods of Time. Elevate legs to the level of the heart and pump ankles as often as possible Elevate leg(s) parallel to the floor when sitting. WOUND #1: - Lower Leg Wound Laterality: Left, Midline Cleanser: Soap and Water 1 x Per Week/30 Days Discharge Instructions: Gently cleanse wound with antibacterial soap, rinse and pat dry prior to dressing wounds Primary Dressing: Silvercel Small 2x2 (in/in) 1 x Per Week/30 Days Discharge Instructions: Apply Silvercel Small 2x2 (in/in) as instructed Secondary Dressing: Xtrasorb Medium 4x5 (in/in) 1 x Per Week/30 Days Discharge Instructions: Apply to wound as directed. Do not cut. Compression Wrap: Profore Lite LF 3 Multilayer Compression Bandaging System 1 x Per Week/30 Days Discharge Instructions: Apply 3 multi-layer wrap as prescribed. 1. Continue silver cell under compression. I think this will be closed and 2 weeks perhaps at current trajectory 2. I have told the patient to make an appointment with her dermatologist about the more distal wound I think this is probably a recurrent skin cancer. She has a history of squamous cell cancer skin cancer Electronic Signature(s) Signed: 11/23/2020 4:35:29 PM By: Linton Ham MD Entered By: Linton Ham on 11/23/2020 11:37:57 [name] ([id number]) -------------------------------------------------------------------------------- SuperBill Details Patient Name: [name] Date of Service: 11/23/2020 Medical Record Number: [id number] Patient Account Number: [id number] Date of Birth/Sex: [birth year] (75 y.o. F) Treating RN: Cornell Barman Primary Care Provider: Harold Barban Other  Clinician: Referring Provider: Harold Barban Treating Provider/Extender: Tito Dine in Treatment: 3 Diagnosis Coding ICD-10 Codes Code Description S80.12XD Contusion of left lower leg, subsequent encounter L97.828 Non-pressure chronic ulcer of other part of left lower leg with other specified severity Facility Procedures CPT4 Code: [id number] Description: [id number] - DEBRIDE WOUND 1ST 20 SQ CM OR < Modifier: Quantity: 1 CPT4 Code: Description: ICD-10 Diagnosis Description S80.12XD Contusion of left lower leg, subsequent encounter Modifier: Quantity: Physician Procedures CPT4 [id number] Description: [id number] - WC PHYS DEBR WO ANESTH 20 SQ CM Modifier: Quantity: 1 CPT4 Code:  Description: ICD-10 Diagnosis Description S80.12XD Contusion of left lower leg, subsequent encounter Modifier: Quantity: Electronic Signature(s) Signed: 11/23/2020 4:35:29 PM By: Linton Ham MD Entered By: Linton Ham on 11/23/2020 11:38:13

## 2020-11-28 ENCOUNTER — Ambulatory Visit: Payer: PPO | Admitting: Dermatology

## 2020-11-28 ENCOUNTER — Other Ambulatory Visit: Payer: Self-pay

## 2020-11-28 DIAGNOSIS — C44729 Squamous cell carcinoma of skin of left lower limb, including hip: Secondary | ICD-10-CM

## 2020-11-28 DIAGNOSIS — S8012XD Contusion of left lower leg, subsequent encounter: Secondary | ICD-10-CM | POA: Diagnosis not present

## 2020-11-28 DIAGNOSIS — L578 Other skin changes due to chronic exposure to nonionizing radiation: Secondary | ICD-10-CM

## 2020-11-28 DIAGNOSIS — L820 Inflamed seborrheic keratosis: Secondary | ICD-10-CM

## 2020-11-28 DIAGNOSIS — D492 Neoplasm of unspecified behavior of bone, soft tissue, and skin: Secondary | ICD-10-CM

## 2020-11-28 NOTE — Progress Notes (Signed)
BENJAMIN, MERRIHEW ([id number]) Visit Report for 11/28/2020 Arrival Information Details Patient Name: [name], [name]. Date of Service: 11/28/2020 4:30 PM Medical Record Number: [id number] Patient Account Number: [id number] Date of Birth/Sex: 1946/05/08 (75 y.o. F) Treating RN: Dolan Amen Primary Care : Harold Barban Other Clinician: Referring : Harold Barban Treating /Extender: Skipper Cliche in Treatment: 3 Visit Information History Since Last Visit Added or deleted any medications: No Patient Arrived: Ambulatory Had a fall or experienced change in No Arrival Time: 16:45 activities of daily living that may affect Accompanied By: husband risk of falls: Transfer Assistance: None Hospitalized since last visit: No Patient Identification Verified: Yes Has Dressing in Place as Prescribed: No Secondary Verification Process Completed: Yes Pain Present Now: No Patient Requires Transmission-Based No Precautions: Patient Has Alerts: Yes Patient Alerts: Patient on Blood Thinner NOT diabetic XARELTO Electronic Signature(s) Signed: 11/28/2020 5:15:51 PM By: Georges Mouse, Minus Breeding RN Entered By: Georges Mouse, Minus Breeding on 11/28/2020 16:46:12 [name] ([id number]) -------------------------------------------------------------------------------- Clinic Level of Care Assessment Details Patient Name: [name] Date of Service: 11/28/2020 4:30 PM Medical Record Number: [id number] Patient Account Number: [id number] Date of Birth/Sex: August 09, 1945 (75 y.o. F) Treating RN: Dolan Amen Primary Care : Harold Barban Other Clinician: Referring : Harold Barban Treating /Extender: Skipper Cliche in Treatment: 3 Clinic Level of Care Assessment Items TOOL 1 Quantity Score []  - Use when EandM and Procedure is performed on INITIAL visit 0 ASSESSMENTS - Nursing Assessment / Reassessment []  - General Physical Exam (combine w/  comprehensive assessment (listed just below) when performed on new 0 pt. evals) []  - 0 Comprehensive Assessment (HX, ROS, Risk Assessments, Wounds Hx, etc.) ASSESSMENTS - Wound and Skin Assessment / Reassessment []  - Dermatologic / Skin Assessment (not related to wound area) 0 ASSESSMENTS - Ostomy and/or Continence Assessment and Care []  - Incontinence Assessment and Management 0 []  - 0 Ostomy Care Assessment and Management (repouching, etc.) PROCESS - Coordination of Care []  - Simple Patient / Family Education for ongoing care 0 []  - 0 Complex (extensive) Patient / Family Education for ongoing care []  - 0 Staff obtains Programmer, systems, Records, Test Results / Process Orders []  - 0 Staff telephones HHA, Nursing Homes / Clarify orders / etc []  - 0 Routine Transfer to another Facility (non-emergent condition) []  - 0 Routine Hospital Admission (non-emergent condition) []  - 0 New Admissions / Biomedical engineer / Ordering NPWT, Apligraf, etc. []  - 0 Emergency Hospital Admission (emergent condition) PROCESS - Special Needs []  - Pediatric / Minor Patient Management 0 []  - 0 Isolation Patient Management []  - 0 Hearing / Language / Visual special needs []  - 0 Assessment of Community assistance (transportation, D/C planning, etc.) []  - 0 Additional assistance / Altered mentation []  - 0 Support Surface(s) Assessment (bed, cushion, seat, etc.) INTERVENTIONS - Miscellaneous []  - External ear exam 0 []  - 0 Patient Transfer (multiple staff / Civil Service fast streamer / Similar devices) []  - 0 Simple Staple / Suture removal (25 or less) []  - 0 Complex Staple / Suture removal (26 or more) []  - 0 Hypo/Hyperglycemic Management (do not check if billed separately) []  - 0 Ankle / Brachial Index (ABI) - do not check if billed separately Has the patient been seen at the hospital within the last three years: Yes Total Score: 0 Level Of Care: ____ [name] ([id number]) Electronic  Signature(s) Signed: 11/28/2020 5:15:51 PM By: Georges Mouse, Minus Breeding RN Entered By: Georges Mouse, Kenia on 11/28/2020 16:48:13 [name] ([id number]) -------------------------------------------------------------------------------- Compression  Therapy Details Patient Name: [name], [name]. Date of Service: 11/28/2020 4:30 PM Medical Record Number: [id number] Patient Account Number: [id number] Date of Birth/Sex: Sep 18, 1945 (75 y.o. F) Treating RN: Dolan Amen Primary Care : Harold Barban Other Clinician: Referring : Harold Barban Treating /Extender: Skipper Cliche in Treatment: 3 Compression Therapy Performed for Wound Assessment: Wound #1 Left,Midline Lower Leg Performed By: Clinician Dolan Amen, RN Compression Type: Three Layer Electronic Signature(s) Signed: 11/28/2020 5:15:51 PM By: Georges Mouse, Minus Breeding RN Entered By: Georges Mouse, Minus Breeding on 11/28/2020 16:47:26 [name] ([id number]) -------------------------------------------------------------------------------- Encounter Discharge Information Details Patient Name: [name] Date of Service: 11/28/2020 4:30 PM Medical Record Number: [id number] Patient Account Number: [id number] Date of Birth/Sex: October 03, 1945 (75 y.o. F) Treating RN: Dolan Amen Primary Care : Harold Barban Other Clinician: Referring : Harold Barban Treating /Extender: Skipper Cliche in Treatment: 3 Encounter Discharge Information Items Discharge Condition: Stable Ambulatory Status: Ambulatory Discharge Destination: Home Transportation: Private Auto Accompanied ByOlam Idler Schedule Follow-up Appointment: Yes Clinical Summary of Care: Electronic Signature(s) Signed: 11/28/2020 5:15:51 PM By: Georges Mouse, Minus Breeding RN Entered By: Georges Mouse, Minus Breeding on 11/28/2020 16:48:07 [name]  ([id number]) -------------------------------------------------------------------------------- Wound Assessment Details Patient Name: [name] Date of Service: 11/28/2020 4:30 PM Medical Record Number: [id number] Patient Account Number: [id number] Date of Birth/Sex: Apr 11, 1946 (75 y.o. F) Treating RN: Dolan Amen Primary Care : Harold Barban Other Clinician: Referring : Harold Barban Treating /Extender: Skipper Cliche in Treatment: 3 Wound Status Wound Number: 1 Primary Trauma, Other Etiology: Wound Location: Left, Midline Lower Leg Wound Status: Open Wounding Event: Trauma Comorbid Cataracts, Arrhythmia, Hypertension, Colitis, Received Date Acquired: 09/26/2020 History: Chemotherapy Weeks Of Treatment: 3 Clustered Wound: No Wound Measurements Length: (cm) 0.9 Width: (cm) 1.2 Depth: (cm) 0.1 Area: (cm) 0.848 Volume: (cm) 0.085 % Reduction in Area: 89.7% % Reduction in Volume: 94.8% Epithelialization: Small (1-33%) Wound Description Classification: Full Thickness Without Exposed Support Structures Exudate Amount: Medium Exudate Type: Serosanguineous Exudate Color: red, brown Foul Odor After Cleansing: No Slough/Fibrino Yes Wound Bed Granulation Amount: Medium (34-66%) Exposed Structure Granulation Quality: Red Fascia Exposed: No Necrotic Amount: Medium (34-66%) Fat Layer (Subcutaneous Tissue) Exposed: Yes Necrotic Quality: Eschar Tendon Exposed: No Muscle Exposed: No Joint Exposed: No Bone Exposed: No Treatment Notes Wound #1 (Lower Leg) Wound Laterality: Left, Midline Cleanser Soap and Water Discharge Instruction: Gently cleanse wound with antibacterial soap, rinse and pat dry prior to dressing wounds Peri-Wound Care Topical Primary Dressing Silvercel Small 2x2 (in/in) Discharge Instruction: Apply Silvercel Small 2x2 (in/in) as instructed Secondary Dressing Xtrasorb Medium 4x5 (in/in) Discharge Instruction: Apply to  wound as directed. Do not cut. Secured With Compression Wrap Profore Lite LF 3 Multilayer Compression Bandaging System Discharge Instruction: Apply 3 multi-layer wrap as prescribed. [name], [name] ([id number]) Compression Stockings Add-Ons Electronic Signature(s) Signed: 11/28/2020 5:15:51 PM By: Georges Mouse, Minus Breeding RN Entered By: Georges Mouse, Minus Breeding on 11/28/2020 16:47:09

## 2020-11-28 NOTE — Progress Notes (Signed)
Follow-Up Visit   Subjective  [name] is a 75 y.o. female who presents for the following: Skin Problem (Pt here to have a growth on the Left lower leg checked today ) and Wound Check (Check wound at the Left upper leg spent 3 months on March 7 she was picking oranges  had a/fall and developed a skin tear on her left lower leg she is also able to show ).  The following portions of the chart were reviewed this encounter and updated as appropriate:   Tobacco  Allergies  Meds  Problems  Med Hx  Surg Hx  Fam Hx     Review of Systems:  No other skin or systemic complaints except as noted in HPI or Assessment and Plan.  Objective  Well appearing patient in no apparent distress; mood and affect are within normal limits.  A focused examination was performed including left leg . Relevant physical exam findings are noted in the Assessment and Plan.  Objective  Right pretibial (2): Erythematous keratotic or waxy stuck-on papule or plaque.    Objective  Left distal lateral pretibial near ankle: Crusted papule    Assessment & Plan  Inflamed seborrheic keratosis (2) Right pretibial  Destruction of lesion - Right pretibial Complexity: simple   Destruction method: cryotherapy   Informed consent: discussed and consent obtained   Timeout:  patient name, date of birth, surgical site, and procedure verified Lesion destroyed using liquid nitrogen: Yes   Region frozen until ice ball extended beyond lesion: Yes   Outcome: patient tolerated procedure well with no complications   Post-procedure details: wound care instructions given    Neoplasm of skin Left distal lateral pretibial near ankle  Epidermal / dermal shaving  Lesion diameter (cm):  1.5 Informed consent: discussed and consent obtained   Timeout: patient name, date of birth, surgical site, and procedure verified   Procedure prep:  Patient was prepped and draped in usual sterile fashion Prep type:  Isopropyl  alcohol Anesthesia: the lesion was anesthetized in a standard fashion   Anesthetic:  1% lidocaine w/ epinephrine 1-100,000 buffered w/ 8.4% NaHCO3 Hemostasis achieved with: pressure, aluminum chloride and electrodesiccation   Outcome: patient tolerated procedure well   Post-procedure details: sterile dressing applied and wound care instructions given   Dressing type: bandage and petrolatum    Destruction of lesion  Destruction method: electrodesiccation and curettage   Informed consent: discussed and consent obtained   Timeout:  patient name, date of birth, surgical site, and procedure verified Anesthesia: the lesion was anesthetized in a standard fashion   Anesthetic:  1% lidocaine w/ epinephrine 1-100,000 buffered w/ 8.4% NaHCO3 Curettage performed in three different directions: Yes   Electrodesiccation performed over the curetted area: Yes   Curettage cycles:  3 Final wound size (cm):  1.5 Hemostasis achieved with:  electrodesiccation Outcome: patient tolerated procedure well with no complications   Post-procedure details: sterile dressing applied and wound care instructions given   Dressing type: petrolatum    Specimen 1 - Surgical pathology Differential Diagnosis: R/O SCC vs other   Check Margins: No Crusted papule  Actinic Damage - chronic, secondary to cumulative UV radiation exposure/sun exposure over time - diffuse scaly erythematous macules with underlying dyspigmentation - Recommend daily broad spectrum sunscreen SPF 30+ to sun-exposed areas, reapply every 2 hours as needed.  - Recommend staying in the shade or wearing long sleeves, sun glasses (UVA+UVB protection) and wide brim hats (4-inch brim around the entire circumference of the  hat). - Call for new or changing lesions.  Return in about 3 months (around 02/28/2021) for TBSE .  IMarye Round, CMA, am acting as scribe for Sarina Ser, MD .  Documentation: I have reviewed the above documentation for  accuracy and completeness, and I agree with the above.  Sarina Ser, MD

## 2020-11-28 NOTE — Patient Instructions (Addendum)
If you have any questions or concerns for your doctor, please call our main line at [phone] and press option 4 to reach your doctor's medical assistant. If no one answers, please leave a voicemail as directed and we will return your call as soon as possible. Messages left after 4 pm will be answered the following business day.   You may also send us a message via MyChart. We typically respond to MyChart messages within 1-2 business days.  For prescription refills, please ask your pharmacy to contact our office. Our fax number is [phone].  If you have an urgent issue when the clinic is closed that cannot wait until the next business day, you can page your doctor at the number below.    Please note that while we do our best to be available for urgent issues outside of office hours, we are not available 24/7.   If you have an urgent issue and are unable to reach us, you may choose to seek medical care at your doctor's office, retail clinic, urgent care center, or emergency room.  If you have a medical emergency, please immediately call 911 or go to the emergency department.  Pager Numbers  - Dr. Kowalski: [phone]  - Dr. Moye: [phone]  - Dr. Stewart: [phone]  In the event of inclement weather, please call our main line at [phone] for an update on the status of any delays or closures.  Dermatology Medication Tips: Please keep the boxes that topical medications come in in order to help keep track of the instructions about where and how to use these. Pharmacies typically print the medication instructions only on the boxes and not directly on the medication tubes.   If your medication is too expensive, please contact our office at [phone] option 4 or send us a message through MyChart.   We are unable to tell what your co-pay for medications will be in advance as this is different depending on your insurance coverage. However, we may be able to find a  substitute medication at lower cost or fill out paperwork to get insurance to cover a needed medication.   If a prior authorization is required to get your medication covered by your insurance company, please allow us 1-2 business days to complete this process.  Drug prices often vary depending on where the prescription is filled and some pharmacies may offer cheaper prices.  The website www.goodrx.com contains coupons for medications through different pharmacies. The prices here do not account for what the cost may be with help from insurance (it may be cheaper with your insurance), but the website can give you the price if you did not use any insurance.  - You can print the associated coupon and take it with your prescription to the pharmacy.  - You may also stop by our office during regular business hours and pick up a GoodRx coupon card.  - If you need your prescription sent electronically to a different pharmacy, notify our office through [facility] MyChart or by phone at [phone] option 4.     Wound Care Instructions  1. Cleanse wound gently with soap and water once a day then pat dry with clean gauze. Apply a thing coat of Petrolatum (petroleum jelly, "Vaseline") over the wound (unless you have an allergy to this). We recommend that you use a new, sterile tube of Vaseline. Do not pick or remove scabs. Do not remove the yellow or white "healing tissue" from the base of the wound.    2. Cover the wound with fresh, clean, nonstick gauze and secure with paper tape. You may use Band-Aids in place of gauze and tape if the would is small enough, but would recommend trimming much of the tape off as there is often too much. Sometimes Band-Aids can irritate the skin.  3. You should call the office for your biopsy report after 1 week if you have not already been contacted.  4. If you experience any problems, such as abnormal amounts of bleeding, swelling, significant bruising, significant pain,  or evidence of infection, please call the office immediately.  5. FOR ADULT SURGERY PATIENTS: If you need something for pain relief you may take 1 extra strength Tylenol (acetaminophen) AND 2 Ibuprofen (200mg each) together every 4 hours as needed for pain. (do not take these if you are allergic to them or if you have a reason you should not take them.) Typically, you may only need pain medication for 1 to 3 days.     

## 2020-11-30 ENCOUNTER — Other Ambulatory Visit: Payer: Self-pay

## 2020-11-30 ENCOUNTER — Encounter: Payer: Self-pay | Admitting: Dermatology

## 2020-11-30 ENCOUNTER — Encounter: Payer: PPO | Admitting: Internal Medicine

## 2020-11-30 DIAGNOSIS — S8012XD Contusion of left lower leg, subsequent encounter: Secondary | ICD-10-CM | POA: Diagnosis not present

## 2020-11-30 DIAGNOSIS — L97822 Non-pressure chronic ulcer of other part of left lower leg with fat layer exposed: Secondary | ICD-10-CM | POA: Diagnosis not present

## 2020-12-01 NOTE — Progress Notes (Signed)
[name], [name] ([id number]) Visit Report for 11/30/2020 Arrival Information Details Patient Name: [name], [name]. Date of Service: 11/30/2020 2:00 PM Medical Record Number: [id number] Patient Account Number: [id number] Date of Birth/Sex: 05-09-1946 (75 y.o. F) Treating RN: Donnamarie Poag Primary Care : Harold Barban Other Clinician: Referring : Harold Barban Treating /Extender: Tito Dine in Treatment: 4 Visit Information History Since Last Visit Added or deleted any medications: No Patient Arrived: Ambulatory Had a fall or experienced change in No Arrival Time: 14:16 activities of daily living that may affect Accompanied By: husband risk of falls: Transfer Assistance: None Hospitalized since last visit: No Patient Identification Verified: Yes Has Dressing in Place as Prescribed: Yes Secondary Verification Process Completed: Yes Has Compression in Place as Prescribed: Yes Patient Requires Transmission-Based No Pain Present Now: No Precautions: Patient Has Alerts: Yes Patient Alerts: Patient on Blood Thinner NOT diabetic XARELTO Electronic Signature(s) Signed: 11/30/2020 4:50:40 PM By: Donnamarie Poag Entered By: Donnamarie Poag on 11/30/2020 14:16:51 [name] ([id number]) -------------------------------------------------------------------------------- Compression Therapy Details Patient Name: [name] Date of Service: 11/30/2020 2:00 PM Medical Record Number: [id number] Patient Account Number: [id number] Date of Birth/Sex: 1946-07-04 (75 y.o. F) Treating RN: Cornell Barman Primary Care : Harold Barban Other Clinician: Referring : Harold Barban Treating /Extender: Tito Dine in Treatment: 4 Compression Therapy Performed for Wound Assessment: Wound #1 Left,Midline Lower Leg Performed By: Clinician Cornell Barman, RN Compression Type: Three Layer Pre Treatment ABI: 1.2 Post Procedure  Diagnosis Same as Pre-procedure Electronic Signature(s) Signed: 11/30/2020 6:06:01 PM By: Gretta Cool, BSN, RN, CWS, Kim RN, BSN Entered By: Gretta Cool, BSN, RN, CWS, Kim on 11/30/2020 14:48:23 [name] ([id number]) -------------------------------------------------------------------------------- Encounter Discharge Information Details Patient Name: [name] Date of Service: 11/30/2020 2:00 PM Medical Record Number: [id number] Patient Account Number: [id number] Date of Birth/Sex: 1945-09-19 (74 y.o. F) Treating RN: Cornell Barman Primary Care : Harold Barban Other Clinician: Referring : Harold Barban Treating /Extender: Tito Dine in Treatment: 4 Encounter Discharge Information Items Discharge Condition: Stable Ambulatory Status: Ambulatory Discharge Destination: Home Transportation: Private Auto Accompanied By: husband Schedule Follow-up Appointment: Yes Clinical Summary of Care: Electronic Signature(s) Signed: 11/30/2020 4:53:23 PM By: Jeanine Luz Entered By: Jeanine Luz on 11/30/2020 15:08:34 [name] ([id number]) -------------------------------------------------------------------------------- Lower Extremity Assessment Details Patient Name: [name] Date of Service: 11/30/2020 2:00 PM Medical Record Number: [id number] Patient Account Number: [id number] Date of Birth/Sex: 1946/07/17 (75 y.o. F) Treating RN: Donnamarie Poag Primary Care : Harold Barban Other Clinician: Referring : Harold Barban Treating /Extender: Tito Dine in Treatment: 4 Edema Assessment Assessed: [Left: Yes] [Right: No] Edema: [Left: Ye] [Right: s] Calf Left: Right: Point of Measurement: 30 cm From Medial Instep 32 cm Ankle Left: Right: Point of Measurement: 11 cm From Medial Instep 19.5 cm Vascular Assessment Pulses: Dorsalis Pedis Palpable: [Left:Yes] Electronic Signature(s) Signed: 11/30/2020  4:50:40 PM By: Donnamarie Poag Signed: 11/30/2020 6:06:01 PM By: Gretta Cool, BSN, RN, CWS, Kim RN, BSN Entered By: Gretta Cool, BSN, RN, CWS, Kim on 11/30/2020 14:47:06 [name] ([id number]) -------------------------------------------------------------------------------- Multi Wound Chart Details Patient Name: [name] Date of Service: 11/30/2020 2:00 PM Medical Record Number: [id number] Patient Account Number: [id number] Date of Birth/Sex: 1945-11-14 (75 y.o. F) Treating RN: Cornell Barman Primary Care : Harold Barban Other Clinician: Referring : Harold Barban Treating /Extender: Tito Dine in Treatment: 4 Vital Signs Height(in): 68 Pulse(bpm): 53 Weight(lbs): 149 Blood Pressure(mmHg): 92/56 Body Mass Index(BMI): 23 Temperature(F): 98.2 Respiratory Rate(breaths/min): 16  Photos: [N/A:N/A] Wound Location: Left, Midline Lower Leg Left, Lateral Lower Leg N/A Wounding Event: Trauma Surgical Injury N/A Primary Etiology: Trauma, Other To be determined N/A Comorbid History: Cataracts, Arrhythmia, Cataracts, Arrhythmia, N/A Hypertension, Colitis, Received Hypertension, Colitis, Received Chemotherapy Chemotherapy Date Acquired: 09/26/2020 11/28/2020 N/A Weeks of Treatment: 4 0 N/A Wound Status: Open Open N/A Measurements L x W x D (cm) 0.3x0.4x0.1 0.6x0.5x0.1 N/A Area (cm) : 0.094 0.236 N/A Volume (cm) : 0.009 0.024 N/A % Reduction in Area: 98.90% N/A N/A % Reduction in Volume: 99.50% N/A N/A Classification: Full Thickness Without Exposed Full Thickness Without Exposed N/A Support Structures Support Structures Exudate Amount: Medium Small N/A Exudate Type: Serosanguineous Sanguinous N/A Exudate Color: red, brown red N/A Granulation Amount: Medium (34-66%) Large (67-100%) N/A Granulation Quality: Red Red N/A Necrotic Amount: Small (1-33%) None Present (0%) N/A Necrotic Tissue: Eschar, Adherent Slough N/A N/A Exposed Structures: Fat Layer  (Subcutaneous Tissue): Fascia: No N/A Yes Fat Layer (Subcutaneous Tissue): Fascia: No No Tendon: No Tendon: No Muscle: No Muscle: No Joint: No Joint: No Bone: No Bone: No Epithelialization: Small (1-33%) None N/A Procedures Performed: Compression Therapy N/A N/A Treatment Notes Wound #1 (Lower Leg) Wound Laterality: Left, Midline Cleanser Soap and Water Discharge Instruction: Gently cleanse wound with antibacterial soap, rinse and pat dry prior to dressing wounds [name], [name] ([id number]) Peri-Wound Care Topical Primary Dressing Silvercel Small 2x2 (in/in) Discharge Instruction: Apply Silvercel Small 2x2 (in/in) as instructed Secondary Dressing ABD Pad 5x9 (in/in) Discharge Instruction: Cover with ABD pad Secured With Compression Wrap Profore Lite LF 3 Multilayer Compression Bandaging System Discharge Instruction: Apply 3 multi-layer wrap as prescribed. Compression Stockings Add-Ons Wound #2 (Lower Leg) Wound Laterality: Left, Lateral Cleanser Soap and Water Discharge Instruction: Gently cleanse wound with antibacterial soap, rinse and pat dry prior to dressing wounds Peri-Wound Care Topical Primary Dressing Silvercel Small 2x2 (in/in) Discharge Instruction: Apply Silvercel Small 2x2 (in/in) as instructed Secondary Dressing ABD Pad 5x9 (in/in) Discharge Instruction: Cover with ABD pad Secured With Compression Wrap Profore Lite LF 3 Multilayer Compression Bandaging System Discharge Instruction: Apply 3 multi-layer wrap as prescribed. Compression Stockings Add-Ons Electronic Signature(s) Signed: 11/30/2020 4:52:48 PM By: Linton Ham MD Entered By: Linton Ham on 11/30/2020 14:54:02 [name] ([id number]) -------------------------------------------------------------------------------- Exmore Details Patient Name: [name] Date of Service: 11/30/2020 2:00 PM Medical Record Number: [id number] Patient Account Number:  [id number] Date of Birth/Sex: [birth year] (75 y.o. F) Treating RN: Cornell Barman Primary Care : Harold Barban Other Clinician: Referring : Harold Barban Treating /Extender: Tito Dine in Treatment: 4 Active Inactive Necrotic Tissue Nursing Diagnoses: Impaired tissue integrity related to necrotic/devitalized tissue Goals: Necrotic/devitalized tissue will be minimized in the wound bed Date Initiated: 11/02/2020 Target Resolution Date: 11/02/2020 Goal Status: Active Patient/caregiver will verbalize understanding of reason and process for debridement of necrotic tissue Date Initiated: 11/02/2020 Target Resolution Date: 11/02/2020 Goal Status: Active Interventions: Assess patient pain level pre-, during and post procedure and prior to discharge Provide education on necrotic tissue and debridement process Treatment Activities: Apply topical anesthetic as ordered : 11/02/2020 Excisional debridement : 11/02/2020 Notes: Wound/Skin Impairment Nursing Diagnoses: Impaired tissue integrity Goals: Patient/caregiver will verbalize understanding of skin care regimen Date Initiated: 11/02/2020 Date Inactivated: 11/23/2020 Target Resolution Date: 11/02/2020 Goal Status: Met Ulcer/skin breakdown will have a volume reduction of 30% by week 4 Date Initiated: 11/02/2020 Target Resolution Date: 12/02/2020 Goal Status: Active Ulcer/skin breakdown will have a volume reduction of 50% by week 8 Date Initiated: 11/02/2020 Target  Resolution Date: 01/02/2021 Goal Status: Active Ulcer/skin breakdown will have a volume reduction of 80% by week 12 Date Initiated: 11/02/2020 Target Resolution Date: 02/01/2021 Goal Status: Active Ulcer/skin breakdown will heal within 14 weeks Date Initiated: 11/02/2020 Target Resolution Date: 03/04/2021 Goal Status: Active Interventions: Assess patient/caregiver ability to obtain necessary supplies Assess patient/caregiver ability to perform  ulcer/skin care regimen upon admission and as needed Assess ulceration(s) every visit Provide education on ulcer and skin care Treatment Activities: Referred to DME  for dressing supplies : 11/02/2020 [name], [name] ([id number]) Skin care regimen initiated : 11/02/2020 Topical wound management initiated : 11/02/2020 Notes: Electronic Signature(s) Signed: 11/30/2020 6:06:01 PM By: Gretta Cool, BSN, RN, CWS, Kim RN, BSN Entered By: Gretta Cool, BSN, RN, CWS, Kim on 11/30/2020 14:47:14 [name] ([id number]) -------------------------------------------------------------------------------- Pain Assessment Details Patient Name: [name] Date of Service: 11/30/2020 2:00 PM Medical Record Number: [id number] Patient Account Number: [id number] Date of Birth/Sex: 12-07-1945 (75 y.o. F) Treating RN: Donnamarie Poag Primary Care : Harold Barban Other Clinician: Referring : Harold Barban Treating /Extender: Tito Dine in Treatment: 4 Active Problems Location of Pain Severity and Description of Pain Patient Has Paino No Site Locations Rate the pain. Current Pain Level: 0 Pain Management and Medication Current Pain Management: Electronic Signature(s) Signed: 11/30/2020 4:50:40 PM By: Donnamarie Poag Entered By: Donnamarie Poag on 11/30/2020 14:17:53 [name] ([id number]) -------------------------------------------------------------------------------- Patient/Caregiver Education Details Patient Name: [name] Date of Service: 11/30/2020 2:00 PM Medical Record Number: [id number] Patient Account Number: [id number] Date of Birth/Gender: 1945-08-27 (75 y.o. F) Treating RN: Cornell Barman Primary Care Physician: Harold Barban Other Clinician: Referring Physician: Harold Barban Treating Physician/Extender: Tito Dine in Treatment: 4 Education Assessment Education Provided To: Patient Education Topics Provided Wound/Skin  Impairment: Handouts: Caring for Your Ulcer Methods: Demonstration, Explain/Verbal Responses: State content correctly Electronic Signature(s) Signed: 11/30/2020 6:06:01 PM By: Gretta Cool, BSN, RN, CWS, Kim RN, BSN Entered By: Gretta Cool, BSN, RN, CWS, Kim on 11/30/2020 14:50:32 [name] ([id number]) -------------------------------------------------------------------------------- Wound Assessment Details Patient Name: [name] Date of Service: 11/30/2020 2:00 PM Medical Record Number: [id number] Patient Account Number: [id number] Date of Birth/Sex: [birth year] (75 y.o. F) Treating RN: Donnamarie Poag Primary Care : Harold Barban Other Clinician: Referring : Harold Barban Treating /Extender: Tito Dine in Treatment: 4 Wound Status Wound Number: 1 Primary Trauma, Other Etiology: Wound Location: Left, Midline Lower Leg Wound Status: Open Wounding Event: Trauma Comorbid Cataracts, Arrhythmia, Hypertension, Colitis, Received Date Acquired: 09/26/2020 History: Chemotherapy Weeks Of Treatment: 4 Clustered Wound: No Photos Wound Measurements Length: (cm) 0.3 Width: (cm) 0.4 Depth: (cm) 0.1 Area: (cm) 0.094 Volume: (cm) 0.009 % Reduction in Area: 98.9% % Reduction in Volume: 99.5% Epithelialization: Small (1-33%) Tunneling: No Undermining: No Wound Description Classification: Full Thickness Without Exposed Support Structures Exudate Amount: Medium Exudate Type: Serosanguineous Exudate Color: red, brown Foul Odor After Cleansing: No Slough/Fibrino Yes Wound Bed Granulation Amount: Medium (34-66%) Exposed Structure Granulation Quality: Red Fascia Exposed: No Necrotic Amount: Small (1-33%) Fat Layer (Subcutaneous Tissue) Exposed: Yes Necrotic Quality: Eschar, Adherent Slough Tendon Exposed: No Muscle Exposed: No Joint Exposed: No Bone Exposed: No Treatment Notes Wound #1 (Lower Leg) Wound Laterality: Left, Midline Cleanser Soap  and Water Discharge Instruction: Gently cleanse wound with antibacterial soap, rinse and pat dry prior to dressing wounds Peri-Wound Care [name], [name] ([id number]) Topical Primary Dressing Silvercel Small 2x2 (in/in) Discharge Instruction: Apply Silvercel Small 2x2 (in/in) as instructed Secondary Dressing ABD Pad 5x9 (in/in) Discharge Instruction:  Cover with ABD pad Secured With Compression Wrap Profore Lite LF 3 Multilayer Compression Bandaging System Discharge Instruction: Apply 3 multi-layer wrap as prescribed. Compression Stockings Add-Ons Electronic Signature(s) Signed: 11/30/2020 4:50:40 PM By: Donnamarie Poag Entered By: Donnamarie Poag on 11/30/2020 14:27:02 [name] ([id number]) -------------------------------------------------------------------------------- Wound Assessment Details Patient Name: [name] Date of Service: 11/30/2020 2:00 PM Medical Record Number: [id number] Patient Account Number: [id number] Date of Birth/Sex: 12-08-1945 (75 y.o. F) Treating RN: Donnamarie Poag Primary Care : Harold Barban Other Clinician: Referring : Harold Barban Treating /Extender: Tito Dine in Treatment: 4 Wound Status Wound Number: 2 Primary To be determined Etiology: Wound Location: Left, Lateral Lower Leg Wound Status: Open Wounding Event: Surgical Injury Comorbid Cataracts, Arrhythmia, Hypertension, Colitis, Received Date Acquired: 11/28/2020 History: Chemotherapy Weeks Of Treatment: 0 Clustered Wound: No Photos Wound Measurements Length: (cm) 0.6 Width: (cm) 0.5 Depth: (cm) 0.1 Area: (cm) 0.236 Volume: (cm) 0.024 % Reduction in Area: % Reduction in Volume: Epithelialization: None Tunneling: No Undermining: No Wound Description Classification: Full Thickness Without Exposed Support Structures Exudate Amount: Small Exudate Type: Sanguinous Exudate Color: red Foul Odor After Cleansing: No Slough/Fibrino No Wound  Bed Granulation Amount: Large (67-100%) Exposed Structure Granulation Quality: Red Fascia Exposed: No Necrotic Amount: None Present (0%) Fat Layer (Subcutaneous Tissue) Exposed: No Tendon Exposed: No Muscle Exposed: No Joint Exposed: No Bone Exposed: No Electronic Signature(s) Signed: 11/30/2020 4:50:40 PM By: Donnamarie Poag Entered By: Donnamarie Poag on 11/30/2020 14:30:55 [name] ([id number]) -------------------------------------------------------------------------------- Vitals Details Patient Name: [name] Date of Service: 11/30/2020 2:00 PM Medical Record Number: [id number] Patient Account Number: [id number] Date of Birth/Sex: [birth year] (75 y.o. F) Treating RN: Donnamarie Poag Primary Care : Harold Barban Other Clinician: Referring : Harold Barban Treating /Extender: Tito Dine in Treatment: 4 Vital Signs Time Taken: 14:16 Temperature (F): 98.2 Height (in): 68 Pulse (bpm): 53 Weight (lbs): 149 Respiratory Rate (breaths/min): 16 Body Mass Index (BMI): 22.7 Blood Pressure (mmHg): 92/56 Reference Range: 80 - 120 mg / dl Electronic Signature(s) Signed: 11/30/2020 4:50:40 PM By: Donnamarie Poag Entered ByDonnamarie Poag on 11/30/2020 14:17:44

## 2020-12-01 NOTE — Progress Notes (Signed)
EVEREST, HACKING ([id number]) Visit Report for 11/30/2020 HPI Details Patient Name: [name], [name]. Date of Service: 11/30/2020 2:00 PM Medical Record Number: [id number] Patient Account Number: [id number] Date of Birth/Sex: 1946/06/06 (75 y.o. F) Treating RN: Cornell Barman Primary Care Provider: Harold Barban Other Clinician: Referring Provider: Harold Barban Treating Provider/Extender: Tito Dine in Treatment: 4 History of Present Illness HPI Description: ADMISSION 11/02/2020 This is a 75 year old woman who spent 3 months in [place] just returning yesterday. On March 7 she was picking oranges for the need he had a fall and developed a skin tear on her left lower leg she is also able to show me pictures. She was seen in ER or urgent care. They tried to replace the skin with Steri-Strips. She suddenly developed swelling in the area. She went back to the urgent care. She has been using Bactroban and Steri-Strips to try and reapplied approximate things ever since. She does have atrial fibrillation on anticoagulants. Past medical history includes paroxysmal atrial fibrillation, multiple skin problems followed by Dr. Nehemiah Massed at [facility] including seborrheic keratosis melanotic nevi hemangiomas and actinic damage secondary to chronic UV exposure. She also has a history of squamous cell carcinoma of the skin and has been treated with radiation to her legs at one point as well as I think phototherapy. She also has hyperlipidemia. ABI in our clinic was 1.19 on the left 11/08/2020 upon evaluation today patient appears to be doing well with regard to her wound on her leg. This is showing signs of good improvement which is great news and overall there does not appear to be any signs of active infection at this time which is great news. Obviously this is dramatically improved even compared to last week when Dr. Dellia Nims saw her. 4/27; left anterior leg wound trauma in the setting of  chronic venous insufficiency and likely solar skin damage. In general this is better than last time I saw this. Healthy looking granulation. There is a small tunnel from about 11-12 o'clock at 0.4 cm. Using silver algia 5/4; left anterior lower leg wound looks a lot better. This is gradually closing over. There is some eschar and flaking skin around the edges of this wound. She shows me a small raised area in the anterior midline more towards the ankle. She also tells me she has a history of squamous cell skin cancer. 5/11; her left anterior lower leg wound which was traumatic in the setting of chronic venous insufficiency actually looks a lot better it is just about closed The last time I saw this she had what looked to be a squamous cell carcinoma on the left lower leg also anteriorly. I sent her back to dermatology they remove this it was apparently squamous cell carcinoma as she has had ofttimes before. This is now opened into a wound as well deeper but with a clean wound surface Electronic Signature(s) Signed: 11/30/2020 4:52:48 PM By: Linton Ham MD Entered By: Linton Ham on 11/30/2020 14:55:15 [name] ([id number]) -------------------------------------------------------------------------------- Physical Exam Details Patient Name: [name] Date of Service: 11/30/2020 2:00 PM Medical Record Number: [id number] Patient Account Number: [id number] Date of Birth/Sex: [birth year] (75 y.o. F) Treating RN: Cornell Barman Primary Care Provider: Harold Barban Other Clinician: Referring Provider: Harold Barban Treating Provider/Extender: Tito Dine in Treatment: 4 Constitutional Patient is hypotensive.. Pulse rate is bradycardic but regular. Respirations regular, non-labored and within target range.. Temperature is normal and within the target range for the patient.Marland Kitchen appears in  no distress. Cardiovascular Pedal pulses are palpable. Patient has chronic venous  insufficiency some degree of solar skin damage. Notes Wound exam; the patient's original wound is just about closed on the upper tibial area. o She has a new area at the surgical site on the lower left mid tibia this is a full-thickness wound but the surface of it looks healthy no debridement was required. Electronic Signature(s) Signed: 11/30/2020 4:52:48 PM By: Linton Ham MD Entered By: Linton Ham on 11/30/2020 14:56:42 [name] ([id number]) -------------------------------------------------------------------------------- Physician Orders Details Patient Name: [name] Date of Service: 11/30/2020 2:00 PM Medical Record Number: [id number] Patient Account Number: [id number] Date of Birth/Sex: 04-20-1946 (75 y.o. F) Treating RN: Cornell Barman Primary Care Provider: Harold Barban Other Clinician: Referring Provider: Harold Barban Treating Provider/Extender: Tito Dine in Treatment: 4 Verbal / Phone Orders: No Diagnosis Coding Follow-up Appointments o Return Appointment in 1 week. Bathing/ Shower/ Hygiene o May shower with wound dressing protected with water repellent cover or cast protector. Edema Control - Lymphedema / Segmental Compressive Device / Other Left Lower Extremity o Elevate, Exercise Daily and Avoid Standing for Long Periods of Time. o Elevate legs to the level of the heart and pump ankles as often as possible o Elevate leg(s) parallel to the floor when sitting. Wound Treatment Wound #1 - Lower Leg Wound Laterality: Left, Midline Cleanser: Soap and Water 1 x Per Week/30 Days Discharge Instructions: Gently cleanse wound with antibacterial soap, rinse and pat dry prior to dressing wounds Primary Dressing: Silvercel Small 2x2 (in/in) 1 x Per Week/30 Days Discharge Instructions: Apply Silvercel Small 2x2 (in/in) as instructed Secondary Dressing: ABD Pad 5x9 (in/in) 1 x Per Week/30 Days Discharge Instructions: Cover with ABD  pad Compression Wrap: Profore Lite LF 3 Multilayer Compression Bandaging System 1 x Per Week/30 Days Discharge Instructions: Apply 3 multi-layer wrap as prescribed. Wound #2 - Lower Leg Wound Laterality: Left, Lateral Cleanser: Soap and Water 1 x Per Week/30 Days Discharge Instructions: Gently cleanse wound with antibacterial soap, rinse and pat dry prior to dressing wounds Primary Dressing: Silvercel Small 2x2 (in/in) 1 x Per Week/30 Days Discharge Instructions: Apply Silvercel Small 2x2 (in/in) as instructed Secondary Dressing: ABD Pad 5x9 (in/in) 1 x Per Week/30 Days Discharge Instructions: Cover with ABD pad Compression Wrap: Profore Lite LF 3 Multilayer Compression Bandaging System 1 x Per Week/30 Days Discharge Instructions: Apply 3 multi-layer wrap as prescribed. Electronic Signature(s) Signed: 11/30/2020 4:52:48 PM By: Linton Ham MD Signed: 11/30/2020 6:06:01 PM By: Gretta Cool, BSN, RN, CWS, Kim RN, BSN Entered By: Gretta Cool, BSN, RN, CWS, Kim on 11/30/2020 14:51:54 [name] ([id number]) -------------------------------------------------------------------------------- Problem List Details Patient Name: [name], [name] Date of Service: 11/30/2020 2:00 PM Medical Record Number: [id number] Patient Account Number: [id number] Date of Birth/Sex: 12-31-1945 (75 y.o. F) Treating RN: Cornell Barman Primary Care Provider: Harold Barban Other Clinician: Referring Provider: Harold Barban Treating Provider/Extender: Tito Dine in Treatment: 4 Active Problems ICD-10 Encounter Code Description Active Date MDM Diagnosis S80.12XD Contusion of left lower leg, subsequent encounter 11/02/2020 No Yes L97.828 Non-pressure chronic ulcer of other part of left lower leg with other 11/02/2020 No Yes specified severity T81.31XS Disruption of external operation (surgical) wound, not elsewhere 11/30/2020 No Yes classified, sequela L97.822 Non-pressure chronic ulcer of other part of left  lower leg with fat layer 11/30/2020 No Yes exposed Inactive Problems Resolved Problems Electronic Signature(s) Signed: 11/30/2020 4:52:48 PM By: Linton Ham MD Entered By: Linton Ham on 11/30/2020 14:53:54 Cheri Kearns  Lemmie Evens ([id number]) -------------------------------------------------------------------------------- Progress Note Details Patient Name: [name], [name]. Date of Service: 11/30/2020 2:00 PM Medical Record Number: [id number] Patient Account Number: [id number] Date of Birth/Sex: 10/06/1945 (75 y.o. F) Treating RN: Cornell Barman Primary Care Provider: Harold Barban Other Clinician: Referring Provider: Harold Barban Treating Provider/Extender: Tito Dine in Treatment: 4 Subjective History of Present Illness (HPI) ADMISSION 11/02/2020 This is a 75 year old woman who spent 3 months in [place] just returning yesterday. On March 7 she was picking oranges for the need he had a fall and developed a skin tear on her left lower leg she is also able to show me pictures. She was seen in ER or urgent care. They tried to replace the skin with Steri-Strips. She suddenly developed swelling in the area. She went back to the urgent care. She has been using Bactroban and Steri-Strips to try and reapplied approximate things ever since. She does have atrial fibrillation on anticoagulants. Past medical history includes paroxysmal atrial fibrillation, multiple skin problems followed by Dr. Nehemiah Massed at [facility] including seborrheic keratosis melanotic nevi hemangiomas and actinic damage secondary to chronic UV exposure. She also has a history of squamous cell carcinoma of the skin and has been treated with radiation to her legs at one point as well as I think phototherapy. She also has hyperlipidemia. ABI in our clinic was 1.19 on the left 11/08/2020 upon evaluation today patient appears to be doing well with regard to her wound on her leg. This is showing signs of  good improvement which is great news and overall there does not appear to be any signs of active infection at this time which is great news. Obviously this is dramatically improved even compared to last week when Dr. Dellia Nims saw her. 4/27; left anterior leg wound trauma in the setting of chronic venous insufficiency and likely solar skin damage. In general this is better than last time I saw this. Healthy looking granulation. There is a small tunnel from about 11-12 o'clock at 0.4 cm. Using silver algia 5/4; left anterior lower leg wound looks a lot better. This is gradually closing over. There is some eschar and flaking skin around the edges of this wound. She shows me a small raised area in the anterior midline more towards the ankle. She also tells me she has a history of squamous cell skin cancer. 5/11; her left anterior lower leg wound which was traumatic in the setting of chronic venous insufficiency actually looks a lot better it is just about closed The last time I saw this she had what looked to be a squamous cell carcinoma on the left lower leg also anteriorly. I sent her back to dermatology they remove this it was apparently squamous cell carcinoma as she has had ofttimes before. This is now opened into a wound as well deeper but with a clean wound surface Objective Constitutional Patient is hypotensive.. Pulse rate is bradycardic but regular. Respirations regular, non-labored and within target range.. Temperature is normal and within the target range for the patient.Marland Kitchen appears in no distress. Vitals Time Taken: 2:16 PM, Height: 68 in, Weight: 149 lbs, BMI: 22.7, Temperature: 98.2 F, Pulse: 53 bpm, Respiratory Rate: 16 breaths/min, Blood Pressure: 92/56 mmHg. Cardiovascular Pedal pulses are palpable. Patient has chronic venous insufficiency some degree of solar skin damage. General Notes: Wound exam; the patient's original wound is just about closed on the upper tibial area. She has a  new area at the surgical site on the lower left mid tibia  this is a full-thickness wound but the surface of it looks healthy no debridement was required. Integumentary (Hair, Skin) Wound #1 status is Open. Original cause of wound was Trauma. The date acquired was: 09/26/2020. The wound has been in treatment 4 weeks. The wound is located on the Left,Midline Lower Leg. The wound measures 0.3cm length x 0.4cm width x 0.1cm depth; 0.094cm^2 area and 0.009cm^3 volume. There is Fat Layer (Subcutaneous Tissue) exposed. There is no tunneling or undermining noted. There is a medium amount of serosanguineous drainage noted. There is medium (34-66%) red granulation within the wound bed. There is a small (1-33%) amount of necrotic tissue within the wound bed including Eschar and Adherent Slough. Wound #2 status is Open. Original cause of wound was Surgical Injury. The date acquired was: 11/28/2020. The wound is located on the Left,Lateral Lower Leg. The wound measures 0.6cm length x 0.5cm width x 0.1cm depth; 0.236cm^2 area and 0.024cm^3 volume. There is no JAMYRIAH, WAMBACH. ([id number]) tunneling or undermining noted. There is a small amount of sanguinous drainage noted. There is large (67-100%) red granulation within the wound bed. There is no necrotic tissue within the wound bed. 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 There was a Three Layer Compression Therapy Procedure with a pre-treatment ABI of 1.2 by Cornell Barman, RN. Post procedure Diagnosis Wound #1: Same as Pre-Procedure Plan Follow-up  Appointments: Return Appointment in 1 week. Bathing/ Shower/ Hygiene: May shower with wound dressing protected with water repellent cover or cast protector. Edema Control - Lymphedema / Segmental Compressive Device / Other: Elevate, Exercise Daily and Avoid Standing for Long Periods of Time. Elevate legs to the level of the heart and pump ankles as often as possible Elevate leg(s) parallel to the floor when sitting. WOUND #1: - Lower Leg Wound Laterality: Left, Midline Cleanser: Soap and Water 1 x Per Week/30 Days Discharge Instructions: Gently cleanse wound with antibacterial soap, rinse and pat dry prior to dressing wounds Primary Dressing: Silvercel Small 2x2 (in/in) 1 x Per Week/30 Days Discharge Instructions: Apply Silvercel Small 2x2 (in/in) as instructed Secondary Dressing: ABD Pad 5x9 (in/in) 1 x Per Week/30 Days Discharge Instructions: Cover with ABD pad Compression Wrap: Profore Lite LF 3 Multilayer Compression Bandaging System 1 x Per Week/30 Days Discharge Instructions: Apply 3 multi-layer wrap as prescribed. WOUND #2: - Lower Leg Wound Laterality: Left, Lateral Cleanser: Soap and Water 1 x Per Week/30 Days Discharge Instructions: Gently cleanse wound with antibacterial soap, rinse and pat dry prior to dressing wounds Primary Dressing: Silvercel Small 2x2 (in/in) 1 x Per Week/30 Days Discharge Instructions: Apply Silvercel Small 2x2 (in/in) as instructed Secondary Dressing: ABD Pad 5x9 (in/in) 1 x Per Week/30 Days Discharge Instructions: Cover with ABD pad Compression Wrap: Profore Lite LF 3 Multilayer Compression Bandaging System 1 x Per Week/30 Days Discharge Instructions: Apply 3 multi-layer wrap as prescribed. #1 I put silver alginate to both wound areas under 3 layer compression. I will see how the surgical site responds. At this point I am not certain if all the margins were clear or not. I am not even sure who the dermatologist who remove this was Electronic  Signature(s) Signed: 11/30/2020 4:52:48 PM By: Linton Ham MD [name] ([id number]) Entered By: Linton Ham on 11/30/2020 14:57:33 [name] ([id number]) -------------------------------------------------------------------------------- SuperBill Details Patient Name: [name] Date of Service: 11/30/2020 Medical Record Number: [id number] Patient Account Number: [id number] Date of Birth/Sex: 1945/12/17 (75 y.o. 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 specified severity T81.31XS Disruption of external operation (surgical) wound, not elsewhere classified, sequela L97.822 Non-pressure chronic ulcer of other part of left lower leg with fat layer exposed Facility Procedures CPT4 Code: [id number] Description: (Facility Use Only) [phone] - [place] LWR LT LEG Modifier: Quantity: 1 Physician Procedures CPT4 [id number] Description: 99213 - WC PHYS LEVEL 3 - EST PT Modifier: Quantity: 1 CPT4 Code: Description: ICD-10 Diagnosis Description S80.12XD Contusion of left lower leg, subsequent encounter T81.31XS Disruption of external operation (surgical) wound, not elsewhere classifi L97.822 Non-pressure chronic ulcer of other part of left lower leg  with fat layer Modifier: ed, sequela exposed Quantity: Electronic Signature(s) Signed: 11/30/2020 4:52:48 PM By: Linton Ham MD Entered By: Linton Ham on 11/30/2020 14:58:05

## 2020-12-02 ENCOUNTER — Ambulatory Visit: Payer: PPO | Admitting: Gastroenterology

## 2020-12-02 ENCOUNTER — Encounter: Payer: Self-pay | Admitting: Gastroenterology

## 2020-12-02 ENCOUNTER — Telehealth: Payer: Self-pay | Admitting: *Deleted

## 2020-12-02 VITALS — BP 102/60 | HR 60 | Ht 67.0 in | Wt 149.6 lb

## 2020-12-02 DIAGNOSIS — R140 Abdominal distension (gaseous): Secondary | ICD-10-CM | POA: Insufficient documentation

## 2020-12-02 DIAGNOSIS — K6389 Other specified diseases of intestine: Secondary | ICD-10-CM

## 2020-12-02 DIAGNOSIS — K594 Anal spasm: Secondary | ICD-10-CM

## 2020-12-02 DIAGNOSIS — R197 Diarrhea, unspecified: Secondary | ICD-10-CM | POA: Diagnosis not present

## 2020-12-02 DIAGNOSIS — R143 Flatulence: Secondary | ICD-10-CM | POA: Diagnosis not present

## 2020-12-02 DIAGNOSIS — K582 Mixed irritable bowel syndrome: Secondary | ICD-10-CM | POA: Diagnosis not present

## 2020-12-02 DIAGNOSIS — K52831 Collagenous colitis: Secondary | ICD-10-CM

## 2020-12-02 DIAGNOSIS — K529 Noninfective gastroenteritis and colitis, unspecified: Secondary | ICD-10-CM | POA: Insufficient documentation

## 2020-12-02 DIAGNOSIS — Z7901 Long term (current) use of anticoagulants: Secondary | ICD-10-CM

## 2020-12-02 DIAGNOSIS — R109 Unspecified abdominal pain: Secondary | ICD-10-CM | POA: Insufficient documentation

## 2020-12-02 MED ORDER — SUTAB 1479-225-188 MG PO TABS: 1.0000 | ORAL_TABLET | Freq: Once | ORAL | 0 refills | Status: AC

## 2020-12-02 NOTE — Telephone Encounter (Signed)
Patient with diagnosis of afib on Xarelto for anticoagulation.    Procedure: colonoscopy Date of procedure: 01/05/21  CHA2DS2-VASc Score = 4  This indicates a 4.8% annual risk of stroke. The patient's score is based upon: CHF History: No HTN History: No Diabetes History: No Stroke History: No Vascular Disease History: Yes Age Score: 2 Gender Score: 1     CrCl 59 ml/min  Per office protocol, patient can hold Xarelto for 1 day prior to procedure.

## 2020-12-02 NOTE — Telephone Encounter (Signed)
    KAYDAN WILHOITE DOB:  October 22, 1945  MRN:  [id number]   Primary Cardiologist: Ida Rogue, MD  Chart reviewed as part of pre-operative protocol coverage. Pharmacy clearance only. Per pharmacy review and office protocols, Miss NAYLEA WIGINGTON may hold Xarelto for 1 day prior to planned procedure.  I will route this recommendation to the requesting party via Epic fax function and remove from pre-op pool.  Please call with questions.  Loel Dubonnet, NP 12/02/2020, 4:50 PM

## 2020-12-02 NOTE — Progress Notes (Signed)
12/02/2020 [name] [id number] 07/18/1946   HISTORY OF PRESENT ILLNESS: This is a pleasant 75 year old female is a patient of Dr. Celesta Aver.  She has history of C. difficile remotely, history of collagenous colitis that she has been treated with budesonide, history of SIBO/SIMO that was treated in October 2021 with Xifaxan and neomycin, history of IBS with mixed constipation and diarrhea but likely diarrhea predominant, proctalgia fugax.  Symptoms have been present for many, many years.  She continues to complain of a lot of diarrhea.  Uses Lomotil, which does help.  Complains of a lot of gas that is very foul-smelling.  She gets a lot of pain and abdominal bloating with the gas.  She has a lot of urgency with bowel movements and has had several episodes of incontinence.  She is very distressed by her symptoms.  They are very limiting to her daily activities and her lifestyle.  She continues to complain of intermittent episodes of pain shooting of her rectum.  She denies rectal bleeding.  She says that she does not remember having any improvement in her symptoms after being treated with the Xifaxan/neomycin in October.  She says that she does not recall really ever taking anything that has helped her symptoms a lot except for maybe a probiotic that she took several years ago that was in packets and was a powder that had to be refrigerated (likely VSL #3).  Her last colonoscopy was in September 2015 at which time the colonic mucosa appeared normal.  She had diverticulosis.  Biopsies confirmed collagenous/microscopic colitis.  She is on Xarelto for history of atrial fibrillation that was prescribed by her cardiologist, Dr. Rockey Situ.   Past Medical History:  Diagnosis Date  . Actinic keratosis 07/13/2020   R lat thigh - bx proven   . Arthritis    "fingers; right shoulder"  . Atypical mole 12/10/2012   left sacral (severe)  . Broken ribs   . C. difficile diarrhea   . Cataract   .  Clostridium difficile colitis 04/01/2016  . Collagenous colitis   . Collagenous colitis   . Coronary Calcium    a. 11/2014 Cardiac CT: Ca2+ score = 50 - all in prox LAD. 69th %'ile for age/sex; b. 05/2015 Ex MV: EF 73%. No infarct/ischemia-->Low risk.  . Diverticulosis   . Esophageal candidiasis ([place])   . GERD (gastroesophageal reflux disease)   . Heart murmur   . Hiatal hernia 2004  . Hyperlipidemia   . IBS (irritable bowel syndrome)   . Internal hemorrhoids 2010   Colon-Dr. Vira Agar   . Kidney stones   . Midsternal chest pain    a. 01/2011 Ex Mv: EF 68%, No ischemia.  . Migraine 01/16/12   "none now for several years"  . Mild Mitral regurgitation    a. 12/2011 Echo: EF 55-60%, no rwma, No evidence of MVP, Mild MR  . Osteoporosis   . PAF (paroxysmal atrial fibrillation) (HCC)    a. diagnosed 12/2011-->s/p DCCV-->flecainide/Xarelto (CHA2DS2VASc = 2).  . Pancreas divisum of native pancreas    probable by CT  . Pterygium eye, bilateral   . Schatzki's ring 2010    EGD- Dr. Vira Agar   . Small intestinal bacterial overgrowth 06/25/2018  . Squamous cell carcinoma of skin 03/31/2014   left mid pretibial, right mid pretibial sup, right mid pretibial inf  . Squamous cell carcinoma of skin 05/19/2014   right ant pretibial inf  . Squamous cell carcinoma of skin 09/05/2015  left dorsum wrist  . Squamous cell carcinoma of skin 11/10/2015   left ant distal thigh  . Squamous cell carcinoma of skin 11/26/2017   right prox calf  . Squamous cell carcinoma of skin 07/09/2018   left prox lat pretibial  . Squamous cell carcinoma of skin 12/02/2018   left pretibial  . Squamous cell carcinoma of skin 05/25/2020   R lower leg anterior   . Squamous cell carcinoma of skin 07/13/2020   R ant thigh    Past Surgical History:  Procedure Laterality Date  . APPENDECTOMY  1963  . BREAST CYST ASPIRATION Bilateral    "several; both sides"  . CARDIOVERSION  01/16/2012   Procedure: CARDIOVERSION;  Surgeon:  Evans Lance, MD;  Location: [facility];  Service: Cardiovascular;  Laterality: N/A;  . COLONOSCOPY  Multiple  . CYSTOSCOPY W/ STONE MANIPULATION  1980's  . EYE SURGERY     x4  . LEG SURGERY     skin cancer removal  . OVARIAN CYST REMOVAL  1990's  . SHOULDER ARTHROSCOPY W/ ROTATOR CUFF REPAIR Right ~ 2005  . SQUAMOUS CELL CARCINOMA EXCISION     "10-12 removed; all over my body - nose, predominately legs"  . TONSILLECTOMY AND ADENOIDECTOMY  1975  . UPPER GASTROINTESTINAL ENDOSCOPY  Multiple   w/dilation, hiatal hernia  . VAGINAL HYSTERECTOMY  1972    reports that she quit smoking about 8 years ago. Her smoking use included cigarettes. She has a 4.00 pack-year smoking history. She has never used smokeless tobacco. She reports previous alcohol use. She reports that she does not use drugs. family history includes Alzheimer's disease in her mother; Diabetes in her father; Heart disease in her father; Irritable bowel syndrome in her father; Kidney disease in her father. Allergies  Allergen Reactions  . Latex   . Adhesive [Tape] Rash  . Aspirin Other (See Comments)    Aggravates Acid Reflux  . Prednisone Palpitations    Made pts heart race      Outpatient Encounter Medications as of 12/02/2020  Medication Sig  . Coenzyme Q10 (COQ10 PO) Take 1 tablet by mouth daily.  Marland Kitchen diltiazem (CARDIZEM) 30 MG tablet Take 1 tablet (30 mg total) by mouth 3 (three) times daily as needed (As needed for atrial fibrillation episodes).  . diphenoxylate-atropine (LOMOTIL) 2.5-0.025 MG tablet Take 1 q am and then every 4 hours prn  . escitalopram (LEXAPRO) 10 MG tablet Take 1 tablet (10 mg total) by mouth daily.  . famciclovir (FAMVIR) 500 MG tablet Take 1 tablet (500 mg total) by mouth 2 (two) times daily. (Patient taking differently: Take 500 mg by mouth 2 (two) times daily as needed.)  . famotidine (PEPCID) 20 MG tablet Take 20 mg by mouth daily.  . flecainide (TAMBOCOR) 100 MG tablet Take 1 tablet (100 mg  total) by mouth 2 (two) times daily.  Marland Kitchen ibandronate (BONIVA) 150 MG tablet Take 1 tablet (150 mg total) by mouth every 30 (thirty) days.  . metoprolol succinate (TOPROL XL) 25 MG 24 hr tablet Take 1.5 tablets (37.5 mg total) by mouth daily.  . rosuvastatin (CRESTOR) 5 MG tablet TAKE 1 TABLET BY MOUTH ONCE DAILY  . XARELTO 20 MG TABS tablet TAKE 1 TABLET BY MOUTH ONCE A DAY WITH SUPPER  . [DISCONTINUED] famotidine (PEPCID) 40 MG tablet Take 1 tablet (40 mg total) by mouth 2 (two) times daily before a meal. Breakfast and supper  . dicyclomine (BENTYL) 10 MG capsule Take 1 capsule (10 mg  total) by mouth 3 (three) times daily between meals as needed for spasms.  . nitroGLYCERIN (NITROSTAT) 0.4 MG SL tablet Place 1 tablet (0.4 mg total) under the tongue every 5 (five) minutes as needed for chest pain.  . [DISCONTINUED] ALPRAZolam (XANAX) 0.5 MG tablet Take 1/2 to 1 tablet po BID prn anxiety (Patient not taking: Reported on 12/02/2020)  . [DISCONTINUED] budesonide (ENTOCORT EC) 3 MG 24 hr capsule Take 3 capsules (9 mg total) by mouth in the morning. (Patient not taking: Reported on 12/02/2020)  . [DISCONTINUED] mupirocin ointment (BACTROBAN) 2 % Apply 1 application topically 2 (two) times daily. (Patient not taking: Reported on 12/02/2020)   No facility-administered encounter medications on file as of 12/02/2020.     REVIEW OF SYSTEMS  : All other systems reviewed and negative except where noted in the History of Present Illness.   PHYSICAL EXAM: BP 102/60   Pulse 60   Ht 5\' 7"  (1.702 m)   Wt 149 lb 9.6 oz (67.9 kg)   SpO2 98%   BMI 23.43 kg/m  General: Well developed white female in no acute distress Head: Normocephalic and atraumatic Eyes:  Sclerae anicteric, conjunctiva pink. Ears: Normal auditory acuity Lungs: Clear throughout to auscultation; no W/R/R. Heart: Regular rate and rhythm; no M/R/G. Abdomen: Soft, non-distended.  BS present and very hyperactive.  Minimal TTP. Rectal:  Will be  done at the time of colonoscopy. Musculoskeletal: Symmetrical with no gross deformities  Skin: No lesions on visible extremities Extremities: No edema  Neurological: Alert oriented x 4, grossly non-focal Psychological:  Alert and cooperative. Normal mood and affect  ASSESSMENT AND PLAN: *History of collagenous colitis: Was on budesonide in the past. *IBS with both constipation and diarrhea, I think more so diarrhea predominant *History of SIBO and SIMO: Positive study in October 2021.  Treated with combination of Xifaxan and neomycin, but patient reports no improvement with that treatment. *Remote history of C. Difficile *Ongoing complaints of diarrhea and foul-smelling gas/flatus: Hard to know what is causing her ongoing symptoms as it could be a combination of the above issues.  I spoke with Dr. Carlean Purl and he would like her to have a repeat SIBO breath test to see if that was adequately treated previously or not to either rule that in or out as an ongoing contributor to her symptoms.  She would like repeat colonoscopy to be sure there is no other issues, biopsies could be repeated to see if collagenous colitis is an ongoing issue.  We will schedule Dr. Carlean Purl.  She can continue to use Lomotil as needed.  She asked about probiotics and we discussed Florastor, which she has used in the past.  She also mentioned something that sounds like VSL #3 and then I did find in her chart that she had used that in the past.  She thinks that that helped her previously.  We discussed trying IBgard for the gas/bloating as well as that something that she has not tried in the past. *Chronic anticoagulation with Xarelto for history of atrial fibrillation:  Will hold Xarelto for 1 day prior to endoscopic procedures - will instruct when and how to resume after procedure. Benefits and risks of procedure explained including risks of bleeding, perforation, infection, missed lesions, reactions to medications and possible  need for hospitalization and surgery for complications. Additional rare but real risk of stroke or other vascular clotting events off of Xarelto also explained and need to seek urgent help if any signs of these problems  occur. Will communicate by phone or EMR with patient's  prescribing provider, Dr. Rockey Situ to confirm that holding Xarelto is reasonable in this case.    CC:  Marinda Elk, MD

## 2020-12-02 NOTE — Patient Instructions (Addendum)
If you are age 75 or older, your body mass index should be between 23-30. Your Body mass index is 23.43 kg/m. If this is out of the aforementioned range listed, please consider follow up with your Primary Care Provider.  If you are age 86 or younger, your body mass index should be between 19-25. Your Body mass index is 23.43 kg/m. If this is out of the aformentioned range listed, please consider follow up with your Primary Care Provider.   Start IBgard use as directed on box.   Start probiotic daily such as Florastor or VSL #3.   You will be contacted by our office prior to your procedure for directions on holding your Xarelto.  If you do not hear from our office 1 week prior to your scheduled procedure, please call [phone] to discuss.   You have been scheduled for a colonoscopy. Please follow written instructions given to you at your visit today.  Please pick up your prep supplies at the pharmacy within the next 1-3 days. If you use inhalers (even only as needed), please bring them with you on the day of your procedure.  Due to recent changes in healthcare laws, you may see the results of your imaging and laboratory studies on MyChart before your provider has had a chance to review them.  We understand that in some cases there may be results that are confusing or concerning to you. Not all laboratory results come back in the same time frame and the provider may be waiting for multiple results in order to interpret others.  Please give [place] 48 hours in order for your provider to thoroughly review all the results before contacting the office for clarification of your results.

## 2020-12-02 NOTE — Telephone Encounter (Signed)
[facility] Pre-operative Risk Assessment     Request for surgical clearance:     Endoscopy Procedure  What type of surgery is being performed?     colonoscopy  When is this surgery scheduled?     Thursday 01/05/21  What type of clearance is required ?   Pharmacy  Are there any medications that need to be held prior to surgery and how long? Xarelto 24 hours  Practice name and name of physician performing surgery?      [facility]  What is your office phone and fax number?      Phone- [phone]  [phone]  Anesthesia type (None, local, MAC, general) ?       MAC

## 2020-12-05 ENCOUNTER — Telehealth: Payer: Self-pay

## 2020-12-05 NOTE — Telephone Encounter (Signed)
-----   Message from Ralene Bathe, MD sent at 12/05/2020 10:52 AM EDT ----- Diagnosis Skin , left distal lateral pretibial near ankle SQUAMOUS CELL CARCINOMA, KERATOACANTHOMA TYPE  Cancer - SCC Already treated Recheck August 2022 visit

## 2020-12-05 NOTE — Telephone Encounter (Signed)
Advsied patient of results/hd

## 2020-12-06 ENCOUNTER — Telehealth: Payer: Self-pay | Admitting: *Deleted

## 2020-12-06 NOTE — Telephone Encounter (Signed)
-----   Message from Loralie Champagne, PA-C sent at 12/02/2020  5:12 PM EDT ----- Will you please let the patient know that I talked with Dr. Carlean Purl and he agrees that colonoscopy is fine, but he would like her to repeat the SIBO breath test prior to the colonoscopy if she would be willing to come back and get one.  He said that it could be that the antibiotics that he treated her with, although they are the most common ones to treat that, maybe were just not adequate or the right choice for her.  He wants to make sure that is still not an ongoing issue.  Also, it looks like that she was given VSL #3 probiotics in the past.  Dr. Carlean Purl also mention one called Visbiome at some point as well but I am not familiar with that.  She could certainly try to obtain the VSL #3 again since she felt like that may have helped her previously.  Thank you,  Jess

## 2020-12-06 NOTE — Telephone Encounter (Signed)
Left message for patient to call office.  

## 2020-12-07 ENCOUNTER — Ambulatory Visit: Payer: PPO | Admitting: Dermatology

## 2020-12-07 ENCOUNTER — Other Ambulatory Visit: Payer: Self-pay

## 2020-12-07 ENCOUNTER — Encounter: Payer: PPO | Admitting: Internal Medicine

## 2020-12-07 DIAGNOSIS — L97822 Non-pressure chronic ulcer of other part of left lower leg with fat layer exposed: Secondary | ICD-10-CM | POA: Diagnosis not present

## 2020-12-07 DIAGNOSIS — S8012XD Contusion of left lower leg, subsequent encounter: Secondary | ICD-10-CM | POA: Diagnosis not present

## 2020-12-07 NOTE — Telephone Encounter (Signed)
Patient informed and voiced understanding

## 2020-12-07 NOTE — Telephone Encounter (Signed)
Spoke with patient and she will come by the office to pick up the SIBO test. She has ordered VSL #3 and will start that in the next couple of days.

## 2020-12-07 NOTE — Telephone Encounter (Signed)
Left message for patient to call office.  

## 2020-12-08 NOTE — Progress Notes (Signed)
SHERL, YZAGUIRRE ([id number]) Visit Report for 12/07/2020 Arrival Information Details Patient Name: [name]. Date of Service: 12/07/2020 3:45 PM Medical Record Number: [id number] Patient Account Number: [id number] Date of Birth/Sex: [birth year] (75 y.o. F) Treating RN: Donnamarie Poag Primary Care : Harold Barban Other Clinician: Referring : Harold Barban Treating /Extender: Tito Dine in Treatment: 5 Visit Information History Since Last Visit Added or deleted any medications: No Patient Arrived: Ambulatory Had a fall or experienced change in No Arrival Time: 16:15 activities of daily living that may affect Accompanied By: self risk of falls: Transfer Assistance: None Hospitalized since last visit: No Patient Identification Verified: Yes Has Dressing in Place as Prescribed: Yes Secondary Verification Process Completed: Yes Has Compression in Place as Prescribed: Yes Patient Requires Transmission-Based No Pain Present Now: No Precautions: Patient Has Alerts: Yes Patient Alerts: Patient on Blood Thinner NOT diabetic XARELTO Electronic Signature(s) Signed: 12/08/2020 8:26:01 AM By: Donnamarie Poag Entered By: Donnamarie Poag on 12/07/2020 16:15:46 [name] ([id number]) -------------------------------------------------------------------------------- Encounter Discharge Information Details Patient Name: [name] Date of Service: 12/07/2020 3:45 PM Medical Record Number: [id number] Patient Account Number: [id number] Date of Birth/Sex: 06-27-1946 (75 y.o. F) Treating RN: Dolan Amen Primary Care : Harold Barban Other Clinician: Referring : Harold Barban Treating /Extender: Tito Dine in Treatment: 5 Encounter Discharge Information Items Post Procedure Vitals Discharge Condition: Stable Temperature (F): 98.9 Ambulatory Status: Ambulatory Pulse (bpm): 62 Discharge Destination:  Home Respiratory Rate (breaths/min): 18 Transportation: Private Auto Blood Pressure (mmHg): 103/58 Accompanied By: husband Schedule Follow-up Appointment: Yes Clinical Summary of Care: Electronic Signature(s) Signed: 12/07/2020 4:51:13 PM By: Georges Mouse, Minus Breeding RN Entered By: Georges Mouse, [place]ia on 12/07/2020 16:51:13 [name] ([id number]) -------------------------------------------------------------------------------- Lower Extremity Assessment Details Patient Name: [name] Date of Service: 12/07/2020 3:45 PM Medical Record Number: [id number] Patient Account Number: [id number] Date of Birth/Sex: Aug 29, 1945 (75 y.o. F) Treating RN: Donnamarie Poag Primary Care : Harold Barban Other Clinician: Referring : Harold Barban Treating /Extender: Tito Dine in Treatment: 5 Edema Assessment Assessed: [Left: Yes] [Right: No] [Left: Edema] [Right: :] Calf Left: Right: Point of Measurement: 30 cm From Medial Instep 32 cm Ankle Left: Right: Point of Measurement: 11 cm From Medial Instep 20 cm Knee To Floor Left: Right: From Medial Instep 43 cm Vascular Assessment Pulses: Dorsalis Pedis Palpable: [Left:Yes] Electronic Signature(s) Signed: 12/08/2020 8:26:01 AM By: Donnamarie Poag Entered By: Donnamarie Poag on 12/07/2020 16:23:32 [name] ([id number]) -------------------------------------------------------------------------------- Multi Wound Chart Details Patient Name: [name] Date of Service: 12/07/2020 3:45 PM Medical Record Number: [id number] Patient Account Number: [id number] Date of Birth/Sex: 14-Dec-1945 (75 y.o. F) Treating RN: Cornell Barman Primary Care : Harold Barban Other Clinician: Referring : Harold Barban Treating /Extender: Tito Dine in Treatment: 5 Vital Signs Height(in): 80 Pulse(bpm): 20 Weight(lbs): 149 Blood Pressure(mmHg): 103/58 Body Mass Index(BMI):  23 Temperature(F): 98.9 Respiratory Rate(breaths/min): 18 Photos: [N/A:N/A] Wound Location: Left, Midline Lower Leg Left, Lateral Lower Leg N/A Wounding Event: Trauma Surgical Injury N/A Primary Etiology: Trauma, Other Malignant Wound N/A Comorbid History: Cataracts, Arrhythmia, Cataracts, Arrhythmia, N/A Hypertension, Colitis, Received Hypertension, Colitis, Received Chemotherapy Chemotherapy Date Acquired: 09/26/2020 11/28/2020 N/A Weeks of Treatment: 5 1 N/A Wound Status: Healed - Epithelialized Open N/A Measurements L x W x D (cm) 0x0x0 0.7x0.7x0.1 N/A Area (cm) : 0 0.385 N/A Volume (cm) : 0 0.038 N/A % Reduction in Area: 100.00% -63.10% N/A % Reduction in Volume: 100.00% -58.30% N/A Classification: Full Thickness Without  Exposed Full Thickness Without Exposed N/A Support Structures Support Structures Exudate Amount: Medium Small N/A Exudate Type: Serosanguineous Sanguinous N/A Exudate Color: red, brown red N/A Granulation Amount: Medium (34-66%) None Present (0%) N/A Granulation Quality: Red N/A N/A Necrotic Amount: Small (1-33%) Large (67-100%) N/A Necrotic Tissue: Eschar Eschar N/A Exposed Structures: Fat Layer (Subcutaneous Tissue): Fat Layer (Subcutaneous Tissue): N/A Yes Yes Fascia: No Fascia: No Tendon: No Tendon: No Muscle: No Muscle: No Joint: No Joint: No Bone: No Bone: No Epithelialization: Large (67-100%) None N/A Debridement: N/A Debridement - Excisional N/A Pre-procedure Verification/Time N/A 16:32 N/A Out Taken: Tissue Debrided: N/A Necrotic/Eschar, Subcutaneous, N/A Slough Level: N/A Skin/Subcutaneous Tissue N/A Debridement Area (sq cm): N/A 0.49 N/A Instrument: N/A Curette N/A Bleeding: N/A Minimum N/A Hemostasis Achieved: N/A Pressure N/A Debridement Treatment N/A Procedure was tolerated well N/A ResponseDORITA, [name] ([id number]) Post Debridement N/A 0.7x0.7x0.1 N/A Measurements L x W x D (cm) Post Debridement Volume: N/A 0.038  N/A (cm) Procedures Performed: N/A Debridement N/A Treatment Notes Wound #1 (Lower Leg) Wound Laterality: Left, Midline Cleanser Peri-Wound Care Topical Primary Dressing Secondary Dressing Secured With Compression Wrap Compression Stockings Add-Ons Wound #2 (Lower Leg) Wound Laterality: Left, Lateral Cleanser Soap and Water Discharge Instruction: Gently cleanse wound with antibacterial soap, rinse and pat dry prior to dressing wounds Peri-Wound Care Topical Primary Dressing Silvercel Small 2x2 (in/in) Discharge Instruction: Apply Silvercel Small 2x2 (in/in) as instructed Secondary Dressing ABD Pad 5x9 (in/in) Discharge Instruction: Cover with ABD pad Secured With Compression Wrap Profore Lite LF 3 Multilayer Compression Bandaging System Discharge Instruction: Apply 3 multi-layer wrap as prescribed. Compression Stockings Add-Ons Electronic Signature(s) Signed: 12/07/2020 5:17:14 PM By: Linton Ham MD Previous Signature: 12/07/2020 4:51:59 PM Version By: Gretta Cool, BSN, RN, CWS, Kim RN, BSN Entered By: Linton Ham on 12/07/2020 16:52:33 [name] ([id number]) -------------------------------------------------------------------------------- [place] Details Patient Name: [name] Date of Service: 12/07/2020 3:45 PM Medical Record Number: [id number] Patient Account Number: [id number] Date of Birth/Sex: [birth year] (75 y.o. F) Treating RN: Cornell Barman Primary Care : Harold Barban Other Clinician: Referring : Harold Barban Treating /Extender: Tito Dine in Treatment: 5 Active Inactive Necrotic Tissue Nursing Diagnoses: Impaired tissue integrity related to necrotic/devitalized tissue Goals: Necrotic/devitalized tissue will be minimized in the wound bed Date Initiated: 11/02/2020 Target Resolution Date: 11/02/2020 Goal Status: Active Patient/caregiver will verbalize understanding of reason and  process for debridement of necrotic tissue Date Initiated: 11/02/2020 Target Resolution Date: 11/02/2020 Goal Status: Active Interventions: Assess patient pain level pre-, during and post procedure and prior to discharge Provide education on necrotic tissue and debridement process Treatment Activities: Apply topical anesthetic as ordered : 11/02/2020 Excisional debridement : 11/02/2020 Notes: Wound/Skin Impairment Nursing Diagnoses: Impaired tissue integrity Goals: Patient/caregiver will verbalize understanding of skin care regimen Date Initiated: 11/02/2020 Date Inactivated: 11/23/2020 Target Resolution Date: 11/02/2020 Goal Status: Met Ulcer/skin breakdown will have a volume reduction of 30% by week 4 Date Initiated: 11/02/2020 Target Resolution Date: 12/02/2020 Goal Status: Active Ulcer/skin breakdown will have a volume reduction of 50% by week 8 Date Initiated: 11/02/2020 Target Resolution Date: 01/02/2021 Goal Status: Active Ulcer/skin breakdown will have a volume reduction of 80% by week 12 Date Initiated: 11/02/2020 Target Resolution Date: 02/01/2021 Goal Status: Active Ulcer/skin breakdown will heal within 14 weeks Date Initiated: 11/02/2020 Target Resolution Date: 03/04/2021 Goal Status: Active Interventions: Assess patient/caregiver ability to obtain necessary supplies Assess patient/caregiver ability to perform ulcer/skin care regimen upon admission and as needed Assess ulceration(s) every visit Provide education  on ulcer and skin care Treatment Activities: Referred to DME  for dressing supplies : 11/02/2020 [name], [name] ([id number]) Skin care regimen initiated : 11/02/2020 Topical wound management initiated : 11/02/2020 Notes: Electronic Signature(s) Signed: 12/07/2020 4:51:59 PM By: Gretta Cool, BSN, RN, CWS, Kim RN, BSN Entered By: Gretta Cool, BSN, RN, CWS, Kim on 12/07/2020 16:30:36 [name]  ([id number]) -------------------------------------------------------------------------------- Pain Assessment Details Patient Name: [name] Date of Service: 12/07/2020 3:45 PM Medical Record Number: [id number] Patient Account Number: [id number] Date of Birth/Sex: Apr 18, 1946 (75 y.o. F) Treating RN: Donnamarie Poag Primary Care : Harold Barban Other Clinician: Referring : Harold Barban Treating /Extender: Tito Dine in Treatment: 5 Active Problems Location of Pain Severity and Description of Pain Patient Has Paino No Site Locations Rate the pain. Current Pain Level: 0 Pain Management and Medication Current Pain Management: Electronic Signature(s) Signed: 12/08/2020 8:26:01 AM By: Donnamarie Poag Entered By: Donnamarie Poag on 12/07/2020 16:16:11 [name] ([id number]) -------------------------------------------------------------------------------- Patient/Caregiver Education Details Patient Name: [name] Date of Service: 12/07/2020 3:45 PM Medical Record Number: [id number] Patient Account Number: [id number] Date of Birth/Gender: 12-01-1945 (75 y.o. F) Treating RN: Cornell Barman Primary Care Physician: Harold Barban Other Clinician: Referring Physician: Harold Barban Treating Physician/Extender: Tito Dine in Treatment: 5 Education Assessment Education Provided To: Patient Education Topics Provided Wound Debridement: Handouts: Wound Debridement Methods: Demonstration, Explain/Verbal Responses: State content correctly Wound/Skin Impairment: Handouts: Caring for Your Ulcer Methods: Demonstration, Explain/Verbal Responses: State content correctly Electronic Signature(s) Signed: 12/07/2020 4:51:59 PM By: Gretta Cool, BSN, RN, CWS, Kim RN, BSN Entered By: Gretta Cool, BSN, RN, CWS, Kim on 12/07/2020 16:34:39 [name] ([id number]) -------------------------------------------------------------------------------- Wound  Assessment Details Patient Name: [name] Date of Service: 12/07/2020 3:45 PM Medical Record Number: [id number] Patient Account Number: [id number] Date of Birth/Sex: March 12, 1946 (75 y.o. F) Treating RN: Cornell Barman Primary Care : Harold Barban Other Clinician: Referring : Harold Barban Treating /Extender: Tito Dine in Treatment: 5 Wound Status Wound Number: 1 Primary Trauma, Other Etiology: Wound Location: Left, Midline Lower Leg Wound Status: Healed - Epithelialized Wounding Event: Trauma Comorbid Cataracts, Arrhythmia, Hypertension, Colitis, Received Date Acquired: 09/26/2020 History: Chemotherapy Weeks Of Treatment: 5 Clustered Wound: No Photos Wound Measurements Length: (cm) 0 Width: (cm) 0 Depth: (cm) 0 Area: (cm) Volume: (cm) % Reduction in Area: 100% % Reduction in Volume: 100% Epithelialization: Large (67-100%) 0 Tunneling: No 0 Undermining: No Wound Description Classification: Full Thickness Without Exposed Support Structu Exudate Amount: Medium Exudate Type: Serosanguineous Exudate Color: red, brown res Foul Odor After Cleansing: No Slough/Fibrino Yes Wound Bed Granulation Amount: Medium (34-66%) Exposed Structure Granulation Quality: Red Fascia Exposed: No Necrotic Amount: Small (1-33%) Fat Layer (Subcutaneous Tissue) Exposed: Yes Necrotic Quality: Eschar Tendon Exposed: No Muscle Exposed: No Joint Exposed: No Bone Exposed: No Treatment Notes Wound #1 (Lower Leg) Wound Laterality: Left, Midline Cleanser Peri-Wound Care Topical Primary Dressing [name], [name] ([id number]) Secondary Dressing Secured With Compression Wrap Compression Stockings Add-Ons Electronic Signature(s) Signed: 12/07/2020 4:51:59 PM By: Gretta Cool, BSN, RN, CWS, Kim RN, BSN Entered By: Gretta Cool, BSN, RN, CWS, Kim on 12/07/2020 16:32:07 [name]  ([id number]) -------------------------------------------------------------------------------- Wound Assessment Details Patient Name: [name] Date of Service: 12/07/2020 3:45 PM Medical Record Number: [id number] Patient Account Number: [id number] Date of Birth/Sex: 1946-02-01 (75 y.o. F) Treating RN: Donnamarie Poag Primary Care : Harold Barban Other Clinician: Referring : Harold Barban Treating /Extender: Tito Dine in Treatment: 5 Wound Status Wound Number: 2 Primary Malignant Wound  Etiology: Wound Location: Left, Lateral Lower Leg Wound Status: Open Wounding Event: Surgical Injury Comorbid Cataracts, Arrhythmia, Hypertension, Colitis, Received Date Acquired: 11/28/2020 History: Chemotherapy Weeks Of Treatment: 1 Clustered Wound: No Photos Wound Measurements Length: (cm) 0.7 Width: (cm) 0.7 Depth: (cm) 0.1 Area: (cm) 0.385 Volume: (cm) 0.038 % Reduction in Area: -63.1% % Reduction in Volume: -58.3% Epithelialization: None Tunneling: No Undermining: No Wound Description Classification: Full Thickness Without Exposed Support Structures Exudate Amount: Small Exudate Type: Sanguinous Exudate Color: red Foul Odor After Cleansing: No Slough/Fibrino No Wound Bed Granulation Amount: None Present (0%) Exposed Structure Necrotic Amount: Large (67-100%) Fascia Exposed: No Necrotic Quality: Eschar Fat Layer (Subcutaneous Tissue) Exposed: Yes Tendon Exposed: No Muscle Exposed: No Joint Exposed: No Bone Exposed: No Treatment Notes Wound #2 (Lower Leg) Wound Laterality: Left, Lateral Cleanser Soap and Water Discharge Instruction: Gently cleanse wound with antibacterial soap, rinse and pat dry prior to dressing wounds Peri-Wound Care [name], [name] ([id number]) Topical Primary Dressing Silvercel Small 2x2 (in/in) Discharge Instruction: Apply Silvercel Small 2x2 (in/in) as instructed Secondary Dressing ABD Pad 5x9  (in/in) Discharge Instruction: Cover with ABD pad Secured With Compression Wrap Profore Lite LF 3 Multilayer Compression Bandaging System Discharge Instruction: Apply 3 multi-layer wrap as prescribed. Compression Stockings Add-Ons Electronic Signature(s) Signed: 12/08/2020 8:26:01 AM By: Donnamarie Poag Entered By: Donnamarie Poag on 12/07/2020 16:22:22 [name] ([id number]) -------------------------------------------------------------------------------- Vitals Details Patient Name: [name] Date of Service: 12/07/2020 3:45 PM Medical Record Number: [id number] Patient Account Number: [id number] Date of Birth/Sex: 1946-02-13 (75 y.o. F) Treating RN: Donnamarie Poag Primary Care : Harold Barban Other Clinician: Referring : Harold Barban Treating /Extender: Tito Dine in Treatment: 5 Vital Signs Time Taken: 16:14 Temperature (F): 98.9 Height (in): 68 Pulse (bpm): 62 Weight (lbs): 149 Respiratory Rate (breaths/min): 18 Body Mass Index (BMI): 22.7 Blood Pressure (mmHg): 103/58 Reference Range: 80 - 120 mg / dl Electronic Signature(s) Signed: 12/08/2020 8:26:01 AM By: Donnamarie Poag Entered ByDonnamarie Poag on 12/07/2020 16:16:02

## 2020-12-08 NOTE — Progress Notes (Signed)
SHERIECE, STASIK ([id number]) Visit Report for 12/07/2020 Debridement Details Patient Name: [name], [name]. Date of Service: 12/07/2020 3:45 PM Medical Record Number: [id number] Patient Account Number: [id number] Date of Birth/Sex: 1945-10-23 (75 y.o. F) Treating RN: Dolan Amen Primary Care Provider: Harold Barban Other Clinician: Referring Provider: Harold Barban Treating Provider/Extender: Tito Dine in Treatment: 5 Debridement Performed for Wound #2 Left,Lateral Lower Leg Assessment: Performed By: Physician Ricard Dillon, MD Debridement Type: Debridement Level of Consciousness (Pre- Awake and Alert procedure): Pre-procedure Verification/Time Out Yes - 16:32 Taken: Total Area Debrided (L x W): 0.7 (cm) x 0.7 (cm) = 0.49 (cm) Tissue and other material Viable, Eschar, Slough, Subcutaneous, Slough debrided: Level: Skin/Subcutaneous Tissue Debridement Description: Excisional Instrument: Curette Bleeding: Minimum Hemostasis Achieved: Pressure Response to Treatment: Procedure was tolerated well Level of Consciousness (Post- Awake and Alert procedure): Post Debridement Measurements of Total Wound Length: (cm) 0.7 Width: (cm) 0.7 Depth: (cm) 0.1 Volume: (cm) 0.038 Character of Wound/Ulcer Post Debridement: Stable Post Procedure Diagnosis Same as Pre-procedure Electronic Signature(s) Signed: 12/07/2020 5:17:14 PM By: Linton Ham MD Signed: 12/08/2020 11:56:40 AM By: Charlett Nose RN Previous Signature: 12/07/2020 4:51:59 PM Version By: Gretta Cool, BSN, RN, CWS, Kim RN, BSN Entered By: Linton Ham on 12/07/2020 16:52:43 [name] ([id number]) -------------------------------------------------------------------------------- HPI Details Patient Name: [name] Date of Service: 12/07/2020 3:45 PM Medical Record Number: [id number] Patient Account Number: [id number] Date of Birth/Sex: [birth year] (75 y.o. F) Treating RN: Dolan Amen Primary Care Provider: Harold Barban Other Clinician: Referring Provider: Harold Barban Treating Provider/Extender: Tito Dine in Treatment: 5 History of Present Illness HPI Description: ADMISSION 11/02/2020 This is a 76 year old woman who spent 3 months in [place] just returning yesterday. On March 7 she was picking oranges for the need he had a fall and developed a skin tear on her left lower leg she is also able to show me pictures. She was seen in ER or urgent care. They tried to replace the skin with Steri-Strips. She suddenly developed swelling in the area. She went back to the urgent care. She has been using Bactroban and Steri-Strips to try and reapplied approximate things ever since. She does have atrial fibrillation on anticoagulants. Past medical history includes paroxysmal atrial fibrillation, multiple skin problems followed by Dr. Nehemiah Massed at [facility] including seborrheic keratosis melanotic nevi hemangiomas and actinic damage secondary to chronic UV exposure. She also has a history of squamous cell carcinoma of the skin and has been treated with radiation to her legs at one point as well as I think phototherapy. She also has hyperlipidemia. ABI in our clinic was 1.19 on the left 11/08/2020 upon evaluation today patient appears to be doing well with regard to her wound on her leg. This is showing signs of good improvement which is great news and overall there does not appear to be any signs of active infection at this time which is great news. Obviously this is dramatically improved even compared to last week when Dr. Dellia Nims saw her. 4/27; left anterior leg wound trauma in the setting of chronic venous insufficiency and likely solar skin damage. In general this is better than last time I saw this. Healthy looking granulation. There is a small tunnel from about 11-12 o'clock at 0.4 cm. Using silver algia 5/4; left anterior lower leg wound looks a lot  better. This is gradually closing over. There is some eschar and flaking skin around the edges of this wound. She shows me a small raised  area in the anterior midline more towards the ankle. She also tells me she has a history of squamous cell skin cancer. 5/11; her left anterior lower leg wound which was traumatic in the setting of chronic venous insufficiency actually looks a lot better it is just about closed The last time I saw this she had what looked to be a squamous cell carcinoma on the left lower leg also anteriorly. I sent her back to dermatology they remove this it was apparently squamous cell carcinoma as she has had ofttimes before. This is now opened into a wound as well deeper but with a clean wound surface 5/18; her traumatic wound on the left upper tibia is closed. The area that there is a squamous cell carcinoma that was removed by dermatology is also almost closed over. She has a follow-up with them sometime late next week. I am hopeful we will be able to heal her out from the wound point of view before then. Electronic Signature(s) Signed: 12/07/2020 5:17:14 PM By: Linton Ham MD Entered By: Linton Ham on 12/07/2020 16:53:38 [name] ([id number]) -------------------------------------------------------------------------------- Physical Exam Details Patient Name: [name] Date of Service: 12/07/2020 3:45 PM Medical Record Number: [id number] Patient Account Number: [id number] Date of Birth/Sex: Sep 06, 1945 (75 y.o. F) Treating RN: Dolan Amen Primary Care Provider: Harold Barban Other Clinician: Referring Provider: Harold Barban Treating Provider/Extender: Tito Dine in Treatment: 5 Constitutional Sitting or standing Blood Pressure is within target range for patient.. Pulse regular and within target range for patient.Marland Kitchen Respirations regular, non- labored and within target range.. Temperature is normal and within the target range for  the patient.Marland Kitchen appears in no distress. Notes Wound exam; the patient's original wound on the upper tibia area is closed. The lower area had thick eschar and debris on the surface I remove this with a #3 curette hemostasis with direct pressure. After this it really looks quite healthy. Electronic Signature(s) Signed: 12/07/2020 5:17:14 PM By: Linton Ham MD Entered By: Linton Ham on 12/07/2020 16:57:05 [name] ([id number]) -------------------------------------------------------------------------------- Physician Orders Details Patient Name: [name] Date of Service: 12/07/2020 3:45 PM Medical Record Number: [id number] Patient Account Number: [id number] Date of Birth/Sex: [birth year] (75 y.o. F) Treating RN: Cornell Barman Primary Care Provider: Harold Barban Other Clinician: Referring Provider: Harold Barban Treating Provider/Extender: Tito Dine in Treatment: 5 Verbal / Phone Orders: No Diagnosis Coding Follow-up Appointments o Return Appointment in 1 week. Bathing/ Shower/ Hygiene o May shower with wound dressing protected with water repellent cover or cast protector. Edema Control - Lymphedema / Segmental Compressive Device / Other Left Lower Extremity o Elevate, Exercise Daily and Avoid Standing for Long Periods of Time. o Elevate legs to the level of the heart and pump ankles as often as possible o Elevate leg(s) parallel to the floor when sitting. Wound Treatment Wound #2 - Lower Leg Wound Laterality: Left, Lateral Cleanser: Soap and Water 1 x Per Week/30 Days Discharge Instructions: Gently cleanse wound with antibacterial soap, rinse and pat dry prior to dressing wounds Primary Dressing: Silvercel Small 2x2 (in/in) 1 x Per Week/30 Days Discharge Instructions: Apply Silvercel Small 2x2 (in/in) as instructed Secondary Dressing: ABD Pad 5x9 (in/in) 1 x Per Week/30 Days Discharge Instructions: Cover with ABD pad Compression Wrap:  Profore Lite LF 3 Multilayer Compression Bandaging System 1 x Per Week/30 Days Discharge Instructions: Apply 3 multi-layer wrap as prescribed. Electronic Signature(s) Signed: 12/07/2020 4:51:59 PM By: Gretta Cool, BSN, RN, CWS, Kim RN, BSN Signed: 12/07/2020  5:17:14 PM By: Linton Ham MD Entered By: Gretta Cool, BSN, RN, CWS, Kim on 12/07/2020 16:34:02 [name] ([id number]) -------------------------------------------------------------------------------- Problem List Details Patient Name: [name] Date of Service: 12/07/2020 3:45 PM Medical Record Number: [id number] Patient Account Number: [id number] Date of Birth/Sex: 06-02-1946 (75 y.o. F) Treating RN: Dolan Amen Primary Care Provider: Harold Barban Other Clinician: Referring Provider: Harold Barban Treating Provider/Extender: Tito Dine in Treatment: 5 Active Problems ICD-10 Encounter Code Description Active Date MDM Diagnosis S80.12XD Contusion of left lower leg, subsequent encounter 11/02/2020 No Yes L97.828 Non-pressure chronic ulcer of other part of left lower leg with other 11/02/2020 No Yes specified severity T81.31XS Disruption of external operation (surgical) wound, not elsewhere 11/30/2020 No Yes classified, sequela L97.822 Non-pressure chronic ulcer of other part of left lower leg with fat layer 11/30/2020 No Yes exposed Inactive Problems Resolved Problems Electronic Signature(s) Signed: 12/07/2020 5:17:14 PM By: Linton Ham MD Entered By: Linton Ham on 12/07/2020 16:52:25 [name] ([id number]) -------------------------------------------------------------------------------- Progress Note Details Patient Name: [name] Date of Service: 12/07/2020 3:45 PM Medical Record Number: [id number] Patient Account Number: [id number] Date of Birth/Sex: June 19, 1946 (75 y.o. F) Treating RN: Dolan Amen Primary Care Provider: Harold Barban Other Clinician: Referring Provider:  Harold Barban Treating Provider/Extender: Tito Dine in Treatment: 5 Subjective History of Present Illness (HPI) ADMISSION 11/02/2020 This is a 75 year old woman who spent 3 months in [place] just returning yesterday. On March 7 she was picking oranges for the need he had a fall and developed a skin tear on her left lower leg she is also able to show me pictures. She was seen in ER or urgent care. They tried to replace the skin with Steri-Strips. She suddenly developed swelling in the area. She went back to the urgent care. She has been using Bactroban and Steri-Strips to try and reapplied approximate things ever since. She does have atrial fibrillation on anticoagulants. Past medical history includes paroxysmal atrial fibrillation, multiple skin problems followed by Dr. Nehemiah Massed at [facility] including seborrheic keratosis melanotic nevi hemangiomas and actinic damage secondary to chronic UV exposure. She also has a history of squamous cell carcinoma of the skin and has been treated with radiation to her legs at one point as well as I think phototherapy. She also has hyperlipidemia. ABI in our clinic was 1.19 on the left 11/08/2020 upon evaluation today patient appears to be doing well with regard to her wound on her leg. This is showing signs of good improvement which is great news and overall there does not appear to be any signs of active infection at this time which is great news. Obviously this is dramatically improved even compared to last week when Dr. Dellia Nims saw her. 4/27; left anterior leg wound trauma in the setting of chronic venous insufficiency and likely solar skin damage. In general this is better than last time I saw this. Healthy looking granulation. There is a small tunnel from about 11-12 o'clock at 0.4 cm. Using silver algia 5/4; left anterior lower leg wound looks a lot better. This is gradually closing over. There is some eschar and flaking skin around  the edges of this wound. She shows me a small raised area in the anterior midline more towards the ankle. She also tells me she has a history of squamous cell skin cancer. 5/11; her left anterior lower leg wound which was traumatic in the setting of chronic venous insufficiency actually looks a lot better it is just about closed  The last time I saw this she had what looked to be a squamous cell carcinoma on the left lower leg also anteriorly. I sent her back to dermatology they remove this it was apparently squamous cell carcinoma as she has had ofttimes before. This is now opened into a wound as well deeper but with a clean wound surface 5/18; her traumatic wound on the left upper tibia is closed. The area that there is a squamous cell carcinoma that was removed by dermatology is also almost closed over. She has a follow-up with them sometime late next week. I am hopeful we will be able to heal her out from the wound point of view before then. Objective Constitutional Sitting or standing Blood Pressure is within target range for patient.. Pulse regular and within target range for patient.Marland Kitchen Respirations regular, non- labored and within target range.. Temperature is normal and within the target range for the patient.Marland Kitchen appears in no distress. Vitals Time Taken: 4:14 PM, Height: 68 in, Weight: 149 lbs, BMI: 22.7, Temperature: 98.9 F, Pulse: 62 bpm, Respiratory Rate: 18 breaths/min, Blood Pressure: 103/58 mmHg. General Notes: Wound exam; the patient's original wound on the upper tibia area is closed. The lower area had thick eschar and debris on the surface I remove this with a #3 curette hemostasis with direct pressure. After this it really looks quite healthy. Integumentary (Hair, Skin) Wound #1 status is Healed - Epithelialized. Original cause of wound was Trauma. The date acquired was: 09/26/2020. The wound has been in treatment 5 weeks. The wound is located on the Left,Midline Lower Leg. The  wound measures 0cm length x 0cm width x 0cm depth; 0cm^2 area and 0cm^3 volume. There is Fat Layer (Subcutaneous Tissue) exposed. There is no tunneling or undermining noted. There is a medium amount of serosanguineous drainage noted. There is medium (34-66%) red granulation within the wound bed. There is a small (1-33%) amount of necrotic tissue within the wound bed including Eschar. Wound #2 status is Open. Original cause of wound was Surgical Injury. The date acquired was: 11/28/2020. The wound has been in treatment 1 weeks. The wound is located on the Left,Lateral Lower Leg. The wound measures 0.7cm length x 0.7cm width x 0.1cm depth; 0.385cm^2 area [name], Heather H. ([id number]) and 0.038cm^3 volume. There is Fat Layer (Subcutaneous Tissue) exposed. There is no tunneling or undermining noted. There is a small amount of sanguinous drainage noted. There is no granulation within the wound bed. There is a large (67-100%) amount of necrotic tissue within the wound bed including Eschar. 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 There was a Excisional Skin/Subcutaneous Tissue Debridement with a total area of 0.49 sq cm performed by Ricard Dillon, MD. With the following instrument(s): Curette to remove Viable tissue/material. Material removed includes Eschar, Subcutaneous Tissue, and Slough. No specimens were taken. A time out was conducted at 16:32, prior to the start of the procedure. A Minimum amount of bleeding was controlled with Pressure. The procedure was tolerated well.  Post Debridement Measurements: 0.7cm length x 0.7cm width x 0.1cm depth; 0.038cm^3 volume. Character of Wound/Ulcer Post Debridement is stable. Post procedure Diagnosis Wound #  2: Same as Pre-Procedure Plan Follow-up Appointments: Return Appointment in 1 week. Bathing/ Shower/ Hygiene: May shower with wound dressing protected with water repellent cover or cast protector. Edema Control - Lymphedema / Segmental Compressive Device / Other: Elevate, Exercise Daily and Avoid Standing for Long Periods of Time. Elevate legs to the level of the heart and pump ankles as often as possible Elevate leg(s) parallel to the floor when sitting. WOUND #2: - Lower Leg Wound Laterality: Left, Lateral Cleanser: Soap and Water 1 x Per Week/30 Days Discharge Instructions: Gently cleanse wound with antibacterial soap, rinse and pat dry prior to dressing wounds Primary Dressing: Silvercel Small 2x2 (in/in) 1 x Per Week/30 Days Discharge Instructions: Apply Silvercel Small 2x2 (in/in) as instructed Secondary Dressing: ABD Pad 5x9 (in/in) 1 x Per Week/30 Days Discharge Instructions: Cover with ABD pad Compression Wrap: Profore Lite LF 3 Multilayer Compression Bandaging System 1 x Per Week/30 Days Discharge Instructions: Apply 3 multi-layer wrap as prescribed. 1. We applied silver alginate to the squamous cell carcinoma surgery site 2. The original wound is closed 3. Still put her in compression this week I think this will be healed next week 4. She has a follow-up appoint with dermatology late next week 5. I have suggested support stockings I am hopeful that these will give a little more compliance than any form of compression stocking Electronic Signature(s) Signed: 12/07/2020 5:17:14 PM By: Linton Ham MD Entered By: Linton Ham on 12/07/2020 16:57:52 [name] ([id number]) -------------------------------------------------------------------------------- SuperBill Details Patient Name: [name] Date of Service: 12/07/2020 Medical Record Number: [id number] Patient Account Number: [id number] Date of Birth/Sex: Nov 05, 1945 (75 y.o. F) Treating RN: Dolan Amen Primary Care Provider: Harold Barban Other Clinician: Referring Provider: Harold Barban Treating Provider/Extender: Tito Dine in Treatment: 5 Diagnosis Coding ICD-10 Codes Code Description S80.12XD Contusion of left lower leg, subsequent encounter L97.828 Non-pressure chronic ulcer of other part of left lower leg with other specified severity T81.31XS Disruption of external operation (surgical) wound, not elsewhere classified, sequela L97.822 Non-pressure chronic ulcer of other part of left lower leg with fat layer exposed Facility Procedures CPT4 Code: [id number] Description: 11042 - DEB SUBQ TISSUE 20 SQ CM/< Modifier: Quantity: 1 CPT4 Code: Description: ICD-10 Diagnosis Description L97.828 Non-pressure chronic ulcer of other part of left lower leg with other spec T81.31XS Disruption of external operation (surgical) wound, not elsewhere classifie Modifier: ified severity d, sequela Quantity: Physician Procedures CPT4 [id number] Description: 11042 - WC PHYS SUBQ TISS 20 SQ CM Modifier: Quantity: 1 CPT4 Code: Description: ICD-10 Diagnosis Description L97.828 Non-pressure chronic ulcer of other part of left lower leg with other spec T81.31XS Disruption of external operation (surgical) wound, not elsewhere classifie Modifier: ified severity d, sequela Quantity: Electronic Signature(s) Signed: 12/07/2020 5:17:14 PM By: Linton Ham MD Entered By: Linton Ham on 12/07/2020 16:58:11

## 2020-12-12 ENCOUNTER — Telehealth: Payer: Self-pay | Admitting: Cardiovascular Disease

## 2020-12-12 MED ORDER — RIVAROXABAN 20 MG PO TABS: ORAL_TABLET | ORAL | 1 refills | Status: DC

## 2020-12-12 NOTE — Telephone Encounter (Signed)
*  STAT* If patient is at the pharmacy, call can be transferred to refill team.   1. Which medications need to be refilled? (please list name of each medication and dose if known) xarelto  2. Which pharmacy/location (including street and city if local pharmacy) is medication to be sent to? [place]   3. Do they need a 30 day or 90 day supply? [place]

## 2020-12-12 NOTE — Telephone Encounter (Signed)
2f, 67.9kg, scr 0.8 01/28/20, lovw/walker 07/01/20, ccr 74.3

## 2020-12-12 NOTE — Telephone Encounter (Signed)
Refill request to local pharmacy

## 2020-12-14 ENCOUNTER — Other Ambulatory Visit: Payer: Self-pay

## 2020-12-14 ENCOUNTER — Encounter: Payer: PPO | Admitting: Internal Medicine

## 2020-12-14 DIAGNOSIS — S8012XD Contusion of left lower leg, subsequent encounter: Secondary | ICD-10-CM | POA: Diagnosis not present

## 2020-12-14 DIAGNOSIS — T8131XA Disruption of external operation (surgical) wound, not elsewhere classified, initial encounter: Secondary | ICD-10-CM | POA: Diagnosis not present

## 2020-12-15 NOTE — Progress Notes (Addendum)
RIA, REDCAY ([id number]) Visit Report for 12/14/2020 Arrival Information Details Patient Name: [name], [name]. Date of Service: 12/14/2020 3:15 PM Medical Record Number: [id number] Patient Account Number: [id number] Date of Birth/Sex: 12-13-1945 (75 y.o. F) Treating RN: Cornell Barman Primary Care : Harold Barban Other Clinician: Referring : Harold Barban Treating /Extender: Tito Dine in Treatment: 6 Visit Information History Since Last Visit Added or deleted any medications: No Patient Arrived: Ambulatory Had a fall or experienced change in No Arrival Time: 15:29 activities of daily living that may affect Accompanied By: husband risk of falls: Transfer Assistance: None Hospitalized since last visit: No Patient Identification Verified: Yes Pain Present Now: No Secondary Verification Process Completed: Yes Patient Requires Transmission-Based No Precautions: Patient Has Alerts: Yes Patient Alerts: Patient on Blood Thinner NOT diabetic XARELTO Electronic Signature(s) Signed: 12/15/2020 4:37:07 PM By: Jeanine Luz Entered By: Jeanine Luz on 12/14/2020 15:30:06 [name] ([id number]) -------------------------------------------------------------------------------- Clinic Level of Care Assessment Details Patient Name: [name] Date of Service: 12/14/2020 3:15 PM Medical Record Number: [id number] Patient Account Number: [id number] Date of Birth/Sex: 04/08/1946 (75 y.o. F) Treating RN: Cornell Barman Primary Care : Harold Barban Other Clinician: Referring : Harold Barban Treating /Extender: Tito Dine in Treatment: 6 Clinic Level of Care Assessment Items TOOL 4 Quantity Score []  - Use when only an EandM is performed on FOLLOW-UP visit 0 ASSESSMENTS - Nursing Assessment / Reassessment X - Reassessment of Co-morbidities (includes updates in patient status) 1 10 X- 1  5 Reassessment of Adherence to Treatment Plan ASSESSMENTS - Wound and Skin Assessment / Reassessment X - Simple Wound Assessment / Reassessment - one wound 1 5 []  - 0 Complex Wound Assessment / Reassessment - multiple wounds []  - 0 Dermatologic / Skin Assessment (not related to wound area) ASSESSMENTS - Focused Assessment []  - Circumferential Edema Measurements - multi extremities 0 []  - 0 Nutritional Assessment / Counseling / Intervention []  - 0 Lower Extremity Assessment (monofilament, tuning fork, pulses) []  - 0 Peripheral Arterial Disease Assessment (using hand held doppler) ASSESSMENTS - Ostomy and/or Continence Assessment and Care []  - Incontinence Assessment and Management 0 []  - 0 Ostomy Care Assessment and Management (repouching, etc.) PROCESS - Coordination of Care X - Simple Patient / Family Education for ongoing care 1 15 []  - 0 Complex (extensive) Patient / Family Education for ongoing care X- 1 10 Staff obtains Programmer, systems, Records, Test Results / Process Orders []  - 0 Staff telephones HHA, Nursing Homes / Clarify orders / etc []  - 0 Routine Transfer to another Facility (non-emergent condition) []  - 0 Routine Hospital Admission (non-emergent condition) []  - 0 New Admissions / Biomedical engineer / Ordering NPWT, Apligraf, etc. []  - 0 Emergency Hospital Admission (emergent condition) X- 1 10 Simple Discharge Coordination []  - 0 Complex (extensive) Discharge Coordination PROCESS - Special Needs []  - Pediatric / Minor Patient Management 0 []  - 0 Isolation Patient Management []  - 0 Hearing / Language / Visual special needs []  - 0 Assessment of Community assistance (transportation, D/C planning, etc.) []  - 0 Additional assistance / Altered mentation []  - 0 Support Surface(s) Assessment (bed, cushion, seat, etc.) INTERVENTIONS - Wound Cleansing / Measurement [name], [name] ([id number]) X- 1 5 Simple Wound Cleansing - one wound []  - 0 Complex Wound  Cleansing - multiple wounds X- 1 5 Wound Imaging (photographs - any number of wounds) []  - 0 Wound Tracing (instead of photographs) X- 1 5 Simple Wound Measurement - one wound []  -  0 Complex Wound Measurement - multiple wounds INTERVENTIONS - Wound Dressings []  - Small Wound Dressing one or multiple wounds 0 X- 1 15 Medium Wound Dressing one or multiple wounds []  - 0 Large Wound Dressing one or multiple wounds []  - 0 Application of Medications - topical []  - 0 Application of Medications - injection INTERVENTIONS - Miscellaneous []  - External ear exam 0 []  - 0 Specimen Collection (cultures, biopsies, blood, body fluids, etc.) []  - 0 Specimen(s) / Culture(s) sent or taken to Lab for analysis []  - 0 Patient Transfer (multiple staff / Civil Service fast streamer / Similar devices) []  - 0 Simple Staple / Suture removal (25 or less) []  - 0 Complex Staple / Suture removal (26 or more) []  - 0 Hypo / Hyperglycemic Management (close monitor of Blood Glucose) []  - 0 Ankle / Brachial Index (ABI) - do not check if billed separately X- 1 5 Vital Signs Has the patient been seen at the hospital within the last three years: Yes Total Score: 90 Level Of Care: New/Established - Level 3 Electronic Signature(s) Signed: 12/15/2020 9:33:55 AM By: Gretta Cool, BSN, RN, CWS, Kim RN, BSN Entered By: Gretta Cool, BSN, RN, CWS, Kim on 12/14/2020 15:53:20 [name] ([id number]) -------------------------------------------------------------------------------- Encounter Discharge Information Details Patient Name: [name] Date of Service: 12/14/2020 3:15 PM Medical Record Number: [id number] Patient Account Number: [id number] Date of Birth/Sex: 12-Oct-1945 (75 y.o. F) Treating RN: Cornell Barman Primary Care : Harold Barban Other Clinician: Referring : Harold Barban Treating /Extender: Tito Dine in Treatment: 6 Encounter Discharge Information Items Post Procedure  Vitals Discharge Condition: Stable Temperature (F): 98.2 Ambulatory Status: Ambulatory Pulse (bpm): 51 Discharge Destination: Home Respiratory Rate (breaths/min): 18 Transportation: Ambulance Blood Pressure (mmHg): 114/70 Accompanied By: self Schedule Follow-up Appointment: Yes Clinical Summary of Care: Electronic Signature(s) Signed: 12/15/2020 9:33:55 AM By: Gretta Cool, BSN, RN, CWS, Kim RN, BSN Entered By: Gretta Cool, BSN, RN, CWS, Kim on 12/14/2020 15:54:35 [name] ([id number]) -------------------------------------------------------------------------------- Lower Extremity Assessment Details Patient Name: [name] Date of Service: 12/14/2020 3:15 PM Medical Record Number: [id number] Patient Account Number: [id number] Date of Birth/Sex: 05/16/1946 (75 y.o. F) Treating RN: Cornell Barman Primary Care : Harold Barban Other Clinician: Referring : Harold Barban Treating /Extender: Tito Dine in Treatment: 6 Edema Assessment Assessed: [Left: Yes] [Right: No] Edema: [Left: Ye] [Right: s] Calf Left: Right: Point of Measurement: 30 cm From Medial Instep 32.9 cm Ankle Left: Right: Point of Measurement: 11 cm From Medial Instep 20 cm Knee To Floor Left: Right: From Medial Instep 43 cm Vascular Assessment Pulses: Dorsalis Pedis Palpable: [Left:Yes] Electronic Signature(s) Signed: 12/15/2020 9:33:55 AM By: Gretta Cool, BSN, RN, CWS, Kim RN, BSN Signed: 12/15/2020 4:37:07 PM By: Jeanine Luz Entered By: Jeanine Luz on 12/14/2020 15:44:14 [name] ([id number]) -------------------------------------------------------------------------------- Multi Wound Chart Details Patient Name: [name] Date of Service: 12/14/2020 3:15 PM Medical Record Number: [id number] Patient Account Number: [id number] Date of Birth/Sex: 12-01-1945 (76 y.o. F) Treating RN: Cornell Barman Primary Care : Harold Barban Other Clinician: Referring  : Harold Barban Treating /Extender: Tito Dine in Treatment: 6 Vital Signs Height(in): 68 Pulse(bpm): 61 Weight(lbs): 149 Blood Pressure(mmHg): 114/70 Body Mass Index(BMI): 23 Temperature(F): 98.2 Respiratory Rate(breaths/min): 18 Photos: [N/A:N/A] Wound Location: Left, Lateral Lower Leg N/A N/A Wounding Event: Surgical Injury N/A N/A Primary Etiology: Malignant Wound N/A N/A Comorbid History: Cataracts, Arrhythmia, N/A N/A Hypertension, Colitis, Received Chemotherapy Date Acquired: 11/28/2020 N/A N/A Weeks of Treatment: 2 N/A N/A Wound Status: Open  N/A N/A Measurements L x W x D (cm) 0.5x0.4x0.1 N/A N/A Area (cm) : 0.157 N/A N/A Volume (cm) : 0.016 N/A N/A % Reduction in Area: 33.50% N/A N/A % Reduction in Volume: 33.30% N/A N/A Classification: Full Thickness Without Exposed N/A N/A Support Structures Exudate Amount: None Present N/A N/A Granulation Amount: None Present (0%) N/A N/A Necrotic Amount: Large (67-100%) N/A N/A Necrotic Tissue: Eschar N/A N/A Exposed Structures: Fascia: No N/A N/A Fat Layer (Subcutaneous Tissue): No Tendon: No Muscle: No Joint: No Bone: No Epithelialization: None N/A N/A Debridement: Debridement - Excisional N/A N/A Pre-procedure Verification/Time 15:48 N/A N/A Out Taken: Tissue Debrided: Necrotic/Eschar, Subcutaneous N/A N/A Level: Skin/Subcutaneous Tissue N/A N/A Debridement Area (sq cm): 0.2 N/A N/A Instrument: Curette N/A N/A Bleeding: Minimum N/A N/A Hemostasis Achieved: Pressure N/A N/A Debridement Treatment Procedure was tolerated well N/A N/A Response: Post Debridement 0.5x0.4x0.2 N/A N/A Measurements L x W x D (cm) Post Debridement Volume: 0.031 N/A N/A (cm) [name], [name] ([id number]) Procedures Performed: Debridement N/A N/A Treatment Notes Wound #2 (Lower Leg) Wound Laterality: Left, Lateral Cleanser Soap and Water Discharge Instruction: Gently cleanse wound with antibacterial  soap, rinse and pat dry prior to dressing wounds Peri-Wound Care Topical Primary Dressing Silvercel Small 2x2 (in/in) Discharge Instruction: Apply Silvercel Small 2x2 (in/in) as instructed Secondary Dressing ABD Pad 5x9 (in/in) Discharge Instruction: Cover with ABD pad Secured With Stretch Net Size 10 (yds) Discharge Instruction: To secure dressing in place. Compression stockings Compression Wrap Compression Stockings Add-Ons Electronic Signature(s) Signed: 12/15/2020 8:39:06 AM By: Linton Ham MD Entered By: Linton Ham on 12/14/2020 16:27:32 [name] ([id number]) -------------------------------------------------------------------------------- Tucker Details Patient Name: [name] Date of Service: 12/14/2020 3:15 PM Medical Record Number: [id number] Patient Account Number: [id number] Date of Birth/Sex: [birth year] (75 y.o. F) Treating RN: Cornell Barman Primary Care : Harold Barban Other Clinician: Referring : Harold Barban Treating /Extender: Tito Dine in Treatment: 6 Active Inactive Electronic Signature(s) Signed: 12/29/2020 11:04:54 AM By: Gretta Cool, BSN, RN, CWS, Kim RN, BSN Previous Signature: 12/15/2020 9:33:55 AM Version By: Gretta Cool, BSN, RN, CWS, Kim RN, BSN Entered By: Gretta Cool, BSN, RN, CWS, Kim on 12/29/2020 11:04:54 [name] ([id number]) -------------------------------------------------------------------------------- Pain Assessment Details Patient Name: [name] Date of Service: 12/14/2020 3:15 PM Medical Record Number: [id number] Patient Account Number: [id number] Date of Birth/Sex: [birth year] (75 y.o. F) Treating RN: Cornell Barman Primary Care : Harold Barban Other Clinician: Referring : Harold Barban Treating /Extender: Tito Dine in Treatment: 6 Active Problems Location of Pain Severity and Description of Pain Patient Has Paino  No Site Locations Rate the pain. Current Pain Level: 0 Pain Management and Medication Current Pain Management: Electronic Signature(s) Signed: 12/15/2020 9:33:55 AM By: Gretta Cool, BSN, RN, CWS, Kim RN, BSN Signed: 12/15/2020 4:37:07 PM By: Jeanine Luz Entered By: Jeanine Luz on 12/14/2020 15:33:36 [name] ([id number]) -------------------------------------------------------------------------------- Patient/Caregiver Education Details Patient Name: [name] Date of Service: 12/14/2020 3:15 PM Medical Record Number: [id number] Patient Account Number: [id number] Date of Birth/Gender: 1945-11-02 (75 y.o. F) Treating RN: Cornell Barman Primary Care Physician: Harold Barban Other Clinician: Referring Physician: Harold Barban Treating Physician/Extender: Tito Dine in Treatment: 6 Education Assessment Education Provided To: Patient Education Topics Provided Wound Debridement: Handouts: Wound Debridement Methods: Demonstration, Explain/Verbal Responses: State content correctly Wound/Skin Impairment: Handouts: Caring for Your Ulcer Methods: Demonstration, Explain/Verbal Responses: State content correctly Electronic Signature(s) Signed: 12/15/2020 9:33:55 AM By: Gretta Cool, BSN, RN, CWS, Kim RN, BSN Entered By: Gretta Cool,  BSN, RN, CWS, Kim on 12/14/2020 15:53:48 [name] ([id number]) -------------------------------------------------------------------------------- Wound Assessment Details Patient Name: [name], FORBESS. Date of Service: 12/14/2020 3:15 PM Medical Record Number: [id number] Patient Account Number: [id number] Date of Birth/Sex: 19-Oct-1945 (75 y.o. F) Treating RN: Cornell Barman Primary Care : Harold Barban Other Clinician: Referring : Harold Barban Treating /Extender: Tito Dine in Treatment: 6 Wound Status Wound Number: 2 Primary Malignant Wound Etiology: Wound Location: Left, Lateral Lower Leg Wound  Status: Open Wounding Event: Surgical Injury Comorbid Cataracts, Arrhythmia, Hypertension, Colitis, Received Date Acquired: 11/28/2020 History: Chemotherapy Weeks Of Treatment: 2 Clustered Wound: No Photos Wound Measurements Length: (cm) 0.5 Width: (cm) 0.4 Depth: (cm) 0.1 Area: (cm) 0.157 Volume: (cm) 0.016 % Reduction in Area: 33.5% % Reduction in Volume: 33.3% Epithelialization: None Tunneling: No Undermining: No Wound Description Classification: Full Thickness Without Exposed Support Structures Exudate Amount: None Present Foul Odor After Cleansing: No Slough/Fibrino No Wound Bed Granulation Amount: None Present (0%) Exposed Structure Necrotic Amount: Large (67-100%) Fascia Exposed: No Necrotic Quality: Eschar Fat Layer (Subcutaneous Tissue) Exposed: No Tendon Exposed: No Muscle Exposed: No Joint Exposed: No Bone Exposed: No Electronic Signature(s) Signed: 12/15/2020 9:33:55 AM By: Gretta Cool, BSN, RN, CWS, Kim RN, BSN Signed: 12/15/2020 4:37:07 PM By: Jeanine Luz Entered By: Jeanine Luz on 12/14/2020 15:42:54 [name] ([id number]) -------------------------------------------------------------------------------- Vitals Details Patient Name: [name] Date of Service: 12/14/2020 3:15 PM Medical Record Number: [id number] Patient Account Number: [id number] Date of Birth/Sex: 1945/07/31 (75 y.o. F) Treating RN: Cornell Barman Primary Care : Harold Barban Other Clinician: Referring : Harold Barban Treating /Extender: Tito Dine in Treatment: 6 Vital Signs Time Taken: 15:32 Temperature (F): 98.2 Height (in): 68 Pulse (bpm): 51 Weight (lbs): 149 Respiratory Rate (breaths/min): 18 Body Mass Index (BMI): 22.7 Blood Pressure (mmHg): 114/70 Reference Range: 80 - 120 mg / dl Electronic Signature(s) Signed: 12/15/2020 4:37:07 PM By: Jeanine Luz Entered By: Jeanine Luz on 12/14/2020 15:33:29

## 2020-12-15 NOTE — Progress Notes (Signed)
[name], [name] ([id number]) Visit Report for 12/14/2020 Debridement Details Patient Name: [name]. Date of Service: 12/14/2020 3:15 PM Medical Record Number: [id number] Patient Account Number: [id number] Date of Birth/Sex: 15-Aug-1945 (75 y.o. F) Treating RN: Cornell Barman Primary Care Provider: Harold Barban Other Clinician: Referring Provider: Harold Barban Treating Provider/Extender: Tito Dine in Treatment: 6 Debridement Performed for Wound #2 Left,Lateral Lower Leg Assessment: Performed By: Physician Ricard Dillon, MD Debridement Type: Debridement Level of Consciousness (Pre- Awake and Alert procedure): Pre-procedure Verification/Time Out Yes - 15:48 Taken: Total Area Debrided (L x W): 0.5 (cm) x 0.4 (cm) = 0.2 (cm) Tissue and other material Viable, Non-Viable, Eschar, Subcutaneous debrided: Level: Skin/Subcutaneous Tissue Debridement Description: Excisional Instrument: Curette Bleeding: Minimum Hemostasis Achieved: Pressure Response to Treatment: Procedure was tolerated well Level of Consciousness (Post- Awake and Alert procedure): Post Debridement Measurements of Total Wound Length: (cm) 0.5 Width: (cm) 0.4 Depth: (cm) 0.2 Volume: (cm) 0.031 Character of Wound/Ulcer Post Debridement: Stable Post Procedure Diagnosis Same as Pre-procedure Electronic Signature(s) Signed: 12/15/2020 8:39:06 AM By: Linton Ham MD Signed: 12/15/2020 9:33:55 AM By: Gretta Cool, BSN, RN, CWS, Kim RN, BSN Entered By: Linton Ham on 12/14/2020 16:27:44 [name] ([id number]) -------------------------------------------------------------------------------- HPI Details Patient Name: [name] Date of Service: 12/14/2020 3:15 PM Medical Record Number: [id number] Patient Account Number: [id number] Date of Birth/Sex: Mar 26, 1946 (75 y.o. F) Treating RN: Cornell Barman Primary Care Provider: Harold Barban Other Clinician: Referring Provider: Harold Barban Treating Provider/Extender: Tito Dine in Treatment: 6 History of Present Illness HPI Description: ADMISSION 11/02/2020 This is a 75 year old woman who spent 3 months in [place] just returning yesterday. On March 7 she was picking oranges for the need he had a fall and developed a skin tear on her left lower leg she is also able to show me pictures. She was seen in ER or urgent care. They tried to replace the skin with Steri-Strips. She suddenly developed swelling in the area. She went Clark to the urgent care. She has been using Bactroban and Steri-Strips to try and reapplied approximate things ever since. She does have atrial fibrillation on anticoagulants. Past medical history includes paroxysmal atrial fibrillation, multiple skin problems followed by Dr. Nehemiah Massed at [facility] including seborrheic keratosis melanotic nevi hemangiomas and actinic damage secondary to chronic UV exposure. She also has a history of squamous cell carcinoma of the skin and has been treated with radiation to her legs at one point as well as I think phototherapy. She also has hyperlipidemia. ABI in our clinic was 1.19 on the left 11/08/2020 upon evaluation today patient appears to be doing well with regard to her wound on her leg. This is showing signs of good improvement which is great news and overall there does not appear to be any signs of active infection at this time which is great news. Obviously this is dramatically improved even compared to last week when Dr. Dellia Nims saw her. 4/27; left anterior leg wound trauma in the setting of chronic venous insufficiency and likely solar skin damage. In general this is better than last time I saw this. Healthy looking granulation. There is a small tunnel from about 11-12 o'clock at 0.4 cm. Using silver algia 5/4; left anterior lower leg wound looks a lot better. This is gradually closing over. There is some eschar and flaking skin around the edges of  this wound. She shows me a small raised area in the anterior midline more towards the ankle. She also tells  me she has a history of squamous cell skin cancer. 5/11; her left anterior lower leg wound which was traumatic in the setting of chronic venous insufficiency actually looks a lot better it is just about closed The last time I saw this she had what looked to be a squamous cell carcinoma on the left lower leg also anteriorly. I sent her Clark to dermatology they remove this it was apparently squamous cell carcinoma as she has had ofttimes before. This is now opened into a wound as well deeper but with a clean wound surface 5/18; her traumatic wound on the left upper tibia is closed. The area that there is a squamous cell carcinoma that was removed by dermatology is also almost closed over. She has a follow-up with them sometime late next week. I am hopeful we will be able to heal her out from the wound point of view before then. 5/25; her traumatic wound on the upper tibia is closed. The squamous cell surgery site had slight amount of eschar over this. She is complaining of irritation of her skin probably a result of the compression. She also has a small area just under her original traumatic wound that popped up under compression this week Electronic Signature(s) Signed: 12/15/2020 8:39:06 AM By: Linton Ham MD Entered By: Linton Ham on 12/14/2020 16:28:27 [name] ([id number]) -------------------------------------------------------------------------------- Physical Exam Details Patient Name: [name] Date of Service: 12/14/2020 3:15 PM Medical Record Number: [id number] Patient Account Number: [id number] Date of Birth/Sex: 06/05/1946 (75 y.o. F) Treating RN: Cornell Barman Primary Care Provider: Harold Barban Other Clinician: Referring Provider: Harold Barban Treating Provider/Extender: Tito Dine in Treatment: 6 Constitutional Sitting or standing  Blood Pressure is within target range for patient.. Pulse regular and within target range for patient.Marland Kitchen Respirations regular, non- labored and within target range.. Temperature is normal and within the target range for the patient.Marland Kitchen appears in no distress. Integumentary (Hair, Skin) Severe actinic keratosis in the left lower leg. Notes Wound exam; the patient's original wound on the upper tibia is closed. The surgical wound on the lower mid tibia had significant eschar over the surface which I removed also some subcutaneous debris. This cleans up quite nicely and should be closed by next week. o The patient has a small wound approximately which may have been wrap injury. Electronic Signature(s) Signed: 12/15/2020 8:39:06 AM By: Linton Ham MD Entered By: Linton Ham on 12/14/2020 16:29:37 [name] ([id number]) -------------------------------------------------------------------------------- Physician Orders Details Patient Name: [name] Date of Service: 12/14/2020 3:15 PM Medical Record Number: [id number] Patient Account Number: [id number] Date of Birth/Sex: [birth year] (75 y.o. F) Treating RN: Cornell Barman Primary Care Provider: Harold Barban Other Clinician: Referring Provider: Harold Barban Treating Provider/Extender: Tito Dine in Treatment: 6 Verbal / Phone Orders: No Diagnosis Coding Follow-up Appointments o Return Appointment in 2 weeks. Bathing/ Shower/ Hygiene o May shower; gently cleanse wound with antibacterial soap, rinse and pat dry prior to dressing wounds Edema Control - Lymphedema / Segmental Compressive Device / Other Left Lower Extremity o Elevate, Exercise Daily and Avoid Standing for Long Periods of Time. o Elevate legs to the level of the heart and pump ankles as often as possible o Elevate leg(s) parallel to the floor when sitting. Wound Treatment Wound #2 - Lower Leg Wound Laterality: Left, Lateral Cleanser: Soap  and Water 1 x Per Week/30 Days Discharge Instructions: Gently cleanse wound with antibacterial soap, rinse and pat dry prior to dressing wounds Primary Dressing:  Silvercel Small 2x2 (in/in) 1 x Per Week/30 Days Discharge Instructions: Apply Silvercel Small 2x2 (in/in) as instructed Secondary Dressing: ABD Pad 5x9 (in/in) 1 x Per Week/30 Days Discharge Instructions: Cover with ABD pad Secured With: Stretch Net Size 10 (yds) 1 x Per Week/30 Days Discharge Instructions: To secure dressing in place. Secured With: Compression stockings 1 x Per Week/30 Days Electronic Signature(s) Signed: 12/15/2020 8:39:06 AM By: Linton Ham MD Signed: 12/15/2020 9:33:55 AM By: Gretta Cool, BSN, RN, CWS, Kim RN, BSN Entered By: Gretta Cool, BSN, RN, CWS, Kim on 12/14/2020 15:52:42 [name] ([id number]) -------------------------------------------------------------------------------- Problem List Details Patient Name: [name] Date of Service: 12/14/2020 3:15 PM Medical Record Number: [id number] Patient Account Number: [id number] Date of Birth/Sex: 12/30/1945 (75 y.o. F) Treating RN: Cornell Barman Primary Care Provider: Harold Barban Other Clinician: Referring Provider: Harold Barban Treating Provider/Extender: Tito Dine in Treatment: 6 Active Problems ICD-10 Encounter Code Description Active Date MDM Diagnosis S80.12XD Contusion of left lower leg, subsequent encounter 11/02/2020 No Yes L97.828 Non-pressure chronic ulcer of other part of left lower leg with other 11/02/2020 No Yes specified severity T81.31XS Disruption of external operation (surgical) wound, not elsewhere 11/30/2020 No Yes classified, sequela L97.822 Non-pressure chronic ulcer of other part of left lower leg with fat layer 11/30/2020 No Yes exposed Inactive Problems Resolved Problems Electronic Signature(s) Signed: 12/15/2020 8:39:06 AM By: Linton Ham MD Entered By: Linton Ham on 12/14/2020 16:27:24 [name] ([id number]) -------------------------------------------------------------------------------- Progress Note Details Patient Name: [name] Date of Service: 12/14/2020 3:15 PM Medical Record Number: [id number] Patient Account Number: [id number] Date of Birth/Sex: 22-Dec-1945 (75 y.o. F) Treating RN: Cornell Barman Primary Care Provider: Harold Barban Other Clinician: Referring Provider: Harold Barban Treating Provider/Extender: Tito Dine in Treatment: 6 Subjective History of Present Illness (HPI) ADMISSION 11/02/2020 This is a 75 year old woman who spent 3 months in [place] just returning yesterday. On March 7 she was picking oranges for the need he had a fall and developed a skin tear on her left lower leg she is also able to show me pictures. She was seen in ER or urgent care. They tried to replace the skin with Steri-Strips. She suddenly developed swelling in the area. She went Clark to the urgent care. She has been using Bactroban and Steri-Strips to try and reapplied approximate things ever since. She does have atrial fibrillation on anticoagulants. Past medical history includes paroxysmal atrial fibrillation, multiple skin problems followed by Dr. Nehemiah Massed at [facility] including seborrheic keratosis melanotic nevi hemangiomas and actinic damage secondary to chronic UV exposure. She also has a history of squamous cell carcinoma of the skin and has been treated with radiation to her legs at one point as well as I think phototherapy. She also has hyperlipidemia. ABI in our clinic was 1.19 on the left 11/08/2020 upon evaluation today patient appears to be doing well with regard to her wound on her leg. This is showing signs of good improvement which is great news and overall there does not appear to be any signs of active infection at this time which is great news. Obviously this is dramatically improved even compared to last week when Dr. Dellia Nims saw  her. 4/27; left anterior leg wound trauma in the setting of chronic venous insufficiency and likely solar skin damage. In general this is better than last time I saw this. Healthy looking granulation. There is a small tunnel from about 11-12 o'clock at 0.4 cm. Using silver algia 5/4; left anterior lower  leg wound looks a lot better. This is gradually closing over. There is some eschar and flaking skin around the edges of this wound. She shows me a small raised area in the anterior midline more towards the ankle. She also tells me she has a history of squamous cell skin cancer. 5/11; her left anterior lower leg wound which was traumatic in the setting of chronic venous insufficiency actually looks a lot better it is just about closed The last time I saw this she had what looked to be a squamous cell carcinoma on the left lower leg also anteriorly. I sent her Clark to dermatology they remove this it was apparently squamous cell carcinoma as she has had ofttimes before. This is now opened into a wound as well deeper but with a clean wound surface 5/18; her traumatic wound on the left upper tibia is closed. The area that there is a squamous cell carcinoma that was removed by dermatology is also almost closed over. She has a follow-up with them sometime late next week. I am hopeful we will be able to heal her out from the wound point of view before then. 5/25; her traumatic wound on the upper tibia is closed. The squamous cell surgery site had slight amount of eschar over this. She is complaining of irritation of her skin probably a result of the compression. She also has a small area just under her original traumatic wound that popped up under compression this week Objective Constitutional Sitting or standing Blood Pressure is within target range for patient.. Pulse regular and within target range for patient.Marland Kitchen Respirations regular, non- labored and within target range.. Temperature is normal and  within the target range for the patient.Marland Kitchen appears in no distress. Vitals Time Taken: 3:32 PM, Height: 68 in, Weight: 149 lbs, BMI: 22.7, Temperature: 98.2 F, Pulse: 51 bpm, Respiratory Rate: 18 breaths/min, Blood Pressure: 114/70 mmHg. General Notes: Wound exam; the patient's original wound on the upper tibia is closed. The surgical wound on the lower mid tibia had significant eschar over the surface which I removed also some subcutaneous debris. This cleans up quite nicely and should be closed by next week. The patient has a small wound approximately which may have been wrap injury. Integumentary (Hair, Skin) Severe actinic keratosis in the left lower leg. Wound #2 status is Open. Original cause of wound was Surgical Injury. The date acquired was: 11/28/2020. The wound has been in treatment 2 weeks. The wound is located on the Left,Lateral Lower Leg. The wound measures 0.5cm length x 0.4cm width x 0.1cm depth; 0.157cm^2 area [name], Heather H. ([id number]) and 0.016cm^3 volume. There is no tunneling or undermining noted. There is a none present amount of drainage noted. There is no granulation within the wound bed. There is a large (67-100%) amount of necrotic tissue within the wound bed including Eschar. 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 There was a Excisional Skin/Subcutaneous Tissue Debridement with a total area of 0.2 sq cm performed by Ricard Dillon, MD. With the following instrument(s): Curette to remove Viable and Non-Viable tissue/material. Material removed includes  Eschar and Subcutaneous Tissue and. No specimens  were taken. A time out was conducted at 15:48, prior to the start of the procedure. A Minimum amount of bleeding was controlled with Pressure. The procedure was tolerated well. Post Debridement Measurements: 0.5cm length x 0.4cm width x 0.2cm depth; 0.031cm^3 volume. Character of Wound/Ulcer Post Debridement is stable. Post procedure Diagnosis Wound #2: Same as Pre-Procedure Plan Follow-up Appointments: Return Appointment in 2 weeks. Bathing/ Shower/ Hygiene: May shower; gently cleanse wound with antibacterial soap, rinse and pat dry prior to dressing wounds Edema Control - Lymphedema / Segmental Compressive Device / Other: Elevate, Exercise Daily and Avoid Standing for Long Periods of Time. Elevate legs to the level of the heart and pump ankles as often as possible Elevate leg(s) parallel to the floor when sitting. WOUND #2: - Lower Leg Wound Laterality: Left, Lateral Cleanser: Soap and Water 1 x Per Week/30 Days Discharge Instructions: Gently cleanse wound with antibacterial soap, rinse and pat dry prior to dressing wounds Primary Dressing: Silvercel Small 2x2 (in/in) 1 x Per Week/30 Days Discharge Instructions: Apply Silvercel Small 2x2 (in/in) as instructed Secondary Dressing: ABD Pad 5x9 (in/in) 1 x Per Week/30 Days Discharge Instructions: Cover with ABD pad Secured With: Stretch Net Size 10 (yds) 1 x Per Week/30 Days Discharge Instructions: To secure dressing in place. Secured With: Compression stockings 1 x Per Week/30 Days 1. We are going to use silver alginate on the 2 open areas 2. The patient can use her own stocking she can change the dressings. 3. She continues to moisturize her skin Electronic Signature(s) Signed: 12/15/2020 8:39:06 AM By: Linton Ham MD Entered By: Linton Ham on 12/14/2020 16:30:08 [name]  ([id number]) -------------------------------------------------------------------------------- SuperBill Details Patient Name: [name] Date of Service: 12/14/2020 Medical Record Number: [id number] Patient Account Number: [id number] Date of Birth/Sex: [birth year] (75 y.o. F) Treating RN: Cornell Barman Primary Care Provider: Harold Barban Other Clinician: Referring Provider: Harold Barban Treating Provider/Extender: Tito Dine in Treatment: 6 Diagnosis Coding ICD-10 Codes Code Description S80.12XD Contusion of left lower leg, subsequent encounter L97.828 Non-pressure chronic ulcer of other part of left lower leg with other specified severity T81.31XS Disruption of external operation (surgical) wound, not elsewhere classified, sequela L97.822 Non-pressure chronic ulcer of other part of left lower leg with fat layer exposed Facility Procedures CPT4 Code: 41660630 Description: [place] VISIT-LEV 3 EST PT Modifier: Quantity: 1 CPT4 Code: [id number] Description: 11042 - DEB SUBQ TISSUE 20 SQ CM/< Modifier: Quantity: 1 CPT4 Code: Description: ICD-10 Diagnosis Description T81.31XS Disruption of external operation (surgical) wound, not elsewhere classified L97.828 Non-pressure chronic ulcer of other part of left lower leg with other speci Modifier: , sequela fied severity Quantity: Physician Procedures CPT4 Code: 3557322 Description: 11042 - WC PHYS SUBQ TISS 20 SQ CM Modifier: Quantity: 1 CPT4 Code: Description: ICD-10 Diagnosis Description T81.31XS Disruption of external operation (surgical) wound, not elsewhere classifie L97.828 Non-pressure chronic ulcer of other part of left lower leg with other spec Modifier: d, sequela ified severity Quantity: Electronic Signature(s) Signed: 12/15/2020 8:39:06 AM By: Linton Ham MD Entered By: Linton Ham on 12/14/2020 16:31:09

## 2020-12-28 ENCOUNTER — Ambulatory Visit: Payer: PPO | Admitting: Internal Medicine

## 2021-01-04 ENCOUNTER — Encounter: Payer: Self-pay | Admitting: Certified Registered Nurse Anesthetist

## 2021-01-05 ENCOUNTER — Ambulatory Visit (AMBULATORY_SURGERY_CENTER): Payer: PPO | Admitting: Internal Medicine

## 2021-01-05 ENCOUNTER — Encounter: Payer: Self-pay | Admitting: Internal Medicine

## 2021-01-05 ENCOUNTER — Other Ambulatory Visit: Payer: Self-pay

## 2021-01-05 VITALS — BP 134/74 | HR 58 | Temp 97.3°F | Resp 34 | Ht 67.0 in | Wt 149.0 lb

## 2021-01-05 DIAGNOSIS — K5730 Diverticulosis of large intestine without perforation or abscess without bleeding: Secondary | ICD-10-CM | POA: Diagnosis not present

## 2021-01-05 DIAGNOSIS — K52831 Collagenous colitis: Secondary | ICD-10-CM

## 2021-01-05 DIAGNOSIS — R143 Flatulence: Secondary | ICD-10-CM | POA: Diagnosis not present

## 2021-01-05 DIAGNOSIS — R197 Diarrhea, unspecified: Secondary | ICD-10-CM

## 2021-01-05 DIAGNOSIS — R140 Abdominal distension (gaseous): Secondary | ICD-10-CM

## 2021-01-05 DIAGNOSIS — I4891 Unspecified atrial fibrillation: Secondary | ICD-10-CM | POA: Diagnosis not present

## 2021-01-05 MED ORDER — SODIUM CHLORIDE 0.9 % IV SOLN: 500.0000 mL | Freq: Once | INTRAVENOUS | Status: DC

## 2021-01-05 NOTE — Progress Notes (Signed)
Vitals JK

## 2021-01-05 NOTE — Op Note (Signed)
[place] Patient Name: [name] Procedure Date: 01/05/2021 1:30 PM MRN: [id number] Endoscopist: Gatha Mayer , MD Age: 75 Referring MD:  Date of Birth: 03/31/1946 Gender: Female Account #: [id number] Procedure:                Colonoscopy Indications:              Chronic diarrhea, Microscopic colitis, Follow-up of                            microscopic colitis Medicines:                Propofol per Anesthesia, Monitored Anesthesia Care Procedure:                Pre-Anesthesia Assessment:                           - Prior to the procedure, a History and Physical                            was performed, and patient medications and                            allergies were reviewed. The patient's tolerance of                            previous anesthesia was also reviewed. The risks                            and benefits of the procedure and the sedation                            options and risks were discussed with the patient.                            All questions were answered, and informed consent                            was obtained. Prior Anticoagulants: The patient                            last took Xarelto (rivaroxaban) 2 days prior to the                            procedure. ASA Grade Assessment: III - A patient                            with severe systemic disease. After reviewing the                            risks and benefits, the patient was deemed in                            satisfactory condition to undergo the procedure.  After obtaining informed consent, the colonoscope                            was passed under direct vision. Throughout the                            procedure, the patient's blood pressure, pulse, and                            oxygen saturations were monitored continuously. The                            Olympus CF-[id number]) [id number] was introduced                            through  the anus and advanced to the the cecum,                            identified by appendiceal orifice and ileocecal                            valve. The colonoscopy was performed without                            difficulty. The patient tolerated the procedure                            well. The quality of the bowel preparation was                            good. The ileocecal valve, appendiceal orifice, and                            rectum were photographed. Scope In: 1:36:14 PM Scope Out: 1:48:01 PM Scope Withdrawal Time: 0 hours 8 minutes 48 seconds  Total Procedure Duration: 0 hours 11 minutes 47 seconds  Findings:                 The perianal and digital rectal examinations were                            normal.                           Multiple diverticula were found in the sigmoid                            colon.                           A diffuse area of friable (with contact bleeding)                            mucosa was found in the ascending colon and in the  cecum. Biopsies were taken with a cold forceps for                            histology. Verification of patient identification                            for the specimen was done. Estimated blood loss was                            minimal.                           The exam was otherwise without abnormality on                            direct and retroflexion views.                           Biopsies for histology were taken with a cold                            forceps from the transverse colon, descending                            colon, sigmoid colon and rectum for evaluation of                            microscopic colitis. Complications:            No immediate complications. Estimated Blood Loss:     Estimated blood loss was minimal. Impression:               - Diverticulosis in the sigmoid colon.                           Dionisio David (with contact bleeding) mucosa in the                             ascending colon and in the cecum. Biopsied.                           - The examination was otherwise normal on direct                            and retroflexion views.                           - Biopsies were taken with a cold forceps from the                            transverse colon, descending colon, sigmoid colon                            and rectum for evaluation of microscopic colitis.  All biopsies from this case in 1 jar - looking for                            status of collagenous colitis. Recommendation:           - Patient has a contact number available for                            emergencies. The signs and symptoms of potential                            delayed complications were discussed with the                            patient. Return to normal activities tomorrow.                            Written discharge instructions were provided to the                            patient.                           - Resume previous diet.                           - Continue present medications.                           - Await pathology results. May need repeat                            budesonide. She is currently improved w/                            diphenoxylate/atropine and VSL#3                           - No repeat colonoscopy due to age.                           - Resume Xarelto (rivaroxaban) at prior dose                            tomorrow. Gatha Mayer, MD 01/05/2021 1:58:23 PM This report has been signed electronically.

## 2021-01-05 NOTE — Patient Instructions (Addendum)
HANDOUTS PROVIDED:  DIVERTICULOSIS  I took biopsies to check the status of your colitis and understand diarrhea.  I will let you know.  Restart Xarelto tomorrow.  I appreciate the opportunity to care for you. Gatha Mayer, MD, FACG  YOU HAD AN ENDOSCOPIC PROCEDURE TODAY AT [facility]:   Refer to the procedure report that was given to you for any specific questions about what was found during the examination.  If the procedure report does not answer your questions, please call your gastroenterologist to clarify.  If you requested that your care partner not be given the details of your procedure findings, then the procedure report has been included in a sealed envelope for you to review at your convenience later.  YOU SHOULD EXPECT: Some feelings of bloating in the abdomen. Passage of more gas than usual.  Walking can help get rid of the air that was put into your GI tract during the procedure and reduce the bloating. If you had a lower endoscopy (such as a colonoscopy or flexible sigmoidoscopy) you may notice spotting of blood in your stool or on the toilet paper. If you underwent a bowel prep for your procedure, you may not have a normal bowel movement for a few days.  Please Note:  You might notice some irritation and congestion in your nose or some drainage.  This is from the oxygen used during your procedure.  There is no need for concern and it should clear up in a day or so.  SYMPTOMS TO REPORT IMMEDIATELY:  Following lower endoscopy (colonoscopy or flexible sigmoidoscopy):  Excessive amounts of blood in the stool  Significant tenderness or worsening of abdominal pains  Swelling of the abdomen that is new, acute  Fever of 100F or higher  For urgent or emergent issues, a gastroenterologist can be reached at any hour by calling [phone]. Do not use MyChart messaging for urgent concerns.    DIET:  We do recommend a small meal at first, but then you may  proceed to your regular diet.  Drink plenty of fluids but you should avoid alcoholic beverages for 24 hours.  ACTIVITY:  You should plan to take it easy for the rest of today and you should NOT DRIVE or use heavy machinery until tomorrow (because of the sedation medicines used during the test).    FOLLOW UP: Our staff will call the number listed on your records Monday morning between 7:15 am and 8:15 am following your procedure to check on you and address any questions or concerns that you may have regarding the information given to you following your procedure. If we do not reach you, we will leave a message.  We will attempt to reach you two times.  During this call, we will ask if you have developed any symptoms of COVID 19. If you develop any symptoms (ie: fever, flu-like symptoms, shortness of breath, cough etc.) before then, please call [phone].  If you test positive for Covid 19 in the 2 weeks post procedure, please call and report this information to [place].    If any biopsies were taken you will be contacted by phone or by letter within the next 1-3 weeks.  Please call us at [phone] if you have not heard about the biopsies in 3 weeks.    SIGNATURES/CONFIDENTIALITY: You and/or your care partner have signed paperwork which will be entered into your electronic medical record.  These signatures attest to the fact that that the  information above on your After Visit Summary has been reviewed and is understood.  Full responsibility of the confidentiality of this discharge information lies with you and/or your care-partner.

## 2021-01-05 NOTE — Progress Notes (Signed)
Report given to PACU, vss 

## 2021-01-05 NOTE — Progress Notes (Signed)
Called to room to assist during endoscopic procedure.  Patient ID and intended procedure confirmed with present staff. Received instructions for my participation in the procedure from the performing physician.  

## 2021-01-09 ENCOUNTER — Telehealth: Payer: Self-pay | Admitting: *Deleted

## 2021-01-09 NOTE — Telephone Encounter (Signed)
  Follow up Call-  Call back number 01/05/2021  Post procedure Call Back phone  # [phone]  Permission to leave phone message Yes  Some recent data might be hidden     Patient questions:  Do you have a fever, pain , or abdominal swelling? No. Pain Score  0 *  Have you tolerated food without any problems? Yes.    Have you been able to return to your normal activities? Yes.    Do you have any questions about your discharge instructions: Diet   No. Medications  No. Follow up visit  No.  Do you have questions or concerns about your Care? Yes.    Actions: * If pain score is 4 or above: No action needed, pain <4.  Have you developed a fever since your procedure? no  2.   Have you had an respiratory symptoms (SOB or cough) since your procedure? no  3.   Have you tested positive for COVID 19 since your procedure no  4.   Have you had any family members/close contacts diagnosed with the COVID 19 since your procedure?  no   If yes to any of these questions please route to Joylene John, RN and Joella Prince, RN

## 2021-01-16 ENCOUNTER — Telehealth: Payer: Self-pay | Admitting: Internal Medicine

## 2021-01-16 NOTE — Telephone Encounter (Signed)
Patient notified that the bx were normal .  She is notified that she will be notified by Dr. Carlean Purl with results when he has reviewed and has a plan for tx

## 2021-01-16 NOTE — Telephone Encounter (Signed)
Inbound call from patient requesting colonoscopy results please.

## 2021-02-20 ENCOUNTER — Ambulatory Visit: Payer: PPO | Admitting: Dermatology

## 2021-02-22 ENCOUNTER — Other Ambulatory Visit: Payer: Self-pay

## 2021-02-22 ENCOUNTER — Ambulatory Visit: Payer: PPO | Admitting: Dermatology

## 2021-02-22 DIAGNOSIS — D1800 Hemangioma unspecified site: Secondary | ICD-10-CM

## 2021-02-22 DIAGNOSIS — C44729 Squamous cell carcinoma of skin of left lower limb, including hip: Secondary | ICD-10-CM | POA: Diagnosis not present

## 2021-02-22 DIAGNOSIS — Z85828 Personal history of other malignant neoplasm of skin: Secondary | ICD-10-CM

## 2021-02-22 DIAGNOSIS — L820 Inflamed seborrheic keratosis: Secondary | ICD-10-CM

## 2021-02-22 DIAGNOSIS — L578 Other skin changes due to chronic exposure to nonionizing radiation: Secondary | ICD-10-CM

## 2021-02-22 DIAGNOSIS — L570 Actinic keratosis: Secondary | ICD-10-CM | POA: Diagnosis not present

## 2021-02-22 DIAGNOSIS — L821 Other seborrheic keratosis: Secondary | ICD-10-CM | POA: Diagnosis not present

## 2021-02-22 DIAGNOSIS — Z1283 Encounter for screening for malignant neoplasm of skin: Secondary | ICD-10-CM

## 2021-02-22 DIAGNOSIS — L814 Other melanin hyperpigmentation: Secondary | ICD-10-CM | POA: Diagnosis not present

## 2021-02-22 DIAGNOSIS — D229 Melanocytic nevi, unspecified: Secondary | ICD-10-CM | POA: Diagnosis not present

## 2021-02-22 DIAGNOSIS — D485 Neoplasm of uncertain behavior of skin: Secondary | ICD-10-CM

## 2021-02-22 NOTE — Patient Instructions (Addendum)
Wound Care Instructions  Cleanse wound gently with soap and water once a day then pat dry with clean gauze. Apply a thing coat of Petrolatum (petroleum jelly, "Vaseline") over the wound (unless you have an allergy to this). We recommend that you use a new, sterile tube of Vaseline. Do not pick or remove scabs. Do not remove the yellow or white "healing tissue" from the base of the wound.  Cover the wound with fresh, clean, nonstick gauze and secure with paper tape. You may use Band-Aids in place of gauze and tape if the would is small enough, but would recommend trimming much of the tape off as there is often too much. Sometimes Band-Aids can irritate the skin.  You should call the office for your biopsy report after 1 week if you have not already been contacted.  If you experience any problems, such as abnormal amounts of bleeding, swelling, significant bruising, significant pain, or evidence of infection, please call the office immediately.  FOR ADULT SURGERY PATIENTS: If you need something for pain relief you may take 1 extra strength Tylenol (acetaminophen) AND 2 Ibuprofen (200mg each) together every 4 hours as needed for pain. (do not take these if you are allergic to them or if you have a reason you should not take them.) Typically, you may only need pain medication for 1 to 3 days.    Cryotherapy Aftercare  Wash gently with soap and water everyday.   Apply Vaseline and Band-Aid daily until healed.    If you have any questions or concerns for your doctor, please call our main line at [phone] and press option 4 to reach your doctor's medical assistant. If no one answers, please leave a voicemail as directed and we will return your call as soon as possible. Messages left after 4 pm will be answered the following business day.   You may also send us a message via MyChart. We typically respond to MyChart messages within 1-2 business days.  For prescription refills, please ask your  pharmacy to contact our office. Our fax number is [phone].  If you have an urgent issue when the clinic is closed that cannot wait until the next business day, you can page your doctor at the number below.    Please note that while we do our best to be available for urgent issues outside of office hours, we are not available 24/7.   If you have an urgent issue and are unable to reach us, you may choose to seek medical care at your doctor's office, retail clinic, urgent care center, or emergency room.  If you have a medical emergency, please immediately call 911 or go to the emergency department.  Pager Numbers  - Dr. Kowalski: [phone]  - Dr. Moye: [phone]  - Dr. Stewart: [phone]  In the event of inclement weather, please call our main line at [phone] for an update on the status of any delays or closures.  Dermatology Medication Tips: Please keep the boxes that topical medications come in in order to help keep track of the instructions about where and how to use these. Pharmacies typically print the medication instructions only on the boxes and not directly on the medication tubes.   If your medication is too expensive, please contact our office at [phone] option 4 or send us a message through MyChart.   We are unable to tell what your co-pay for medications will be in advance as this is different depending on your insurance coverage. However,   we may be able to find a substitute medication at lower cost or fill out paperwork to get insurance to cover a needed medication.   If a prior authorization is required to get your medication covered by your insurance company, please allow us 1-2 business days to complete this process.  Drug prices often vary depending on where the prescription is filled and some pharmacies may offer cheaper prices.  The website www.goodrx.com contains coupons for medications through different pharmacies. The prices here do not  account for what the cost may be with help from insurance (it may be cheaper with your insurance), but the website can give you the price if you did not use any insurance.  - You can print the associated coupon and take it with your prescription to the pharmacy.  - You may also stop by our office during regular business hours and pick up a GoodRx coupon card.  - If you need your prescription sent electronically to a different pharmacy, notify our office through [place] MyChart or by phone at [phone] option 4.  

## 2021-02-22 NOTE — Progress Notes (Signed)
Follow-Up Visit   Subjective  [name] is a 74 y.o. female who presents for the following: Annual Exam (History of SCC - TBSE today).  She has several sore areas she would like evaluated and other spots to be checked. The patient presents for Total-Body Skin Exam (TBSE) for skin cancer screening and mole check.  The following portions of the chart were reviewed this encounter and updated as appropriate:   Tobacco  Allergies  Meds  Problems  Med Hx  Surg Hx  Fam Hx     Review of Systems:  No other skin or systemic complaints except as noted in HPI or Assessment and Plan.  Objective  Well appearing patient in no apparent distress; mood and affect are within normal limits.  A full examination was performed including scalp, head, eyes, ears, nose, lips, neck, chest, axillae, abdomen, back, buttocks, bilateral upper extremities, bilateral lower extremities, hands, feet, fingers, toes, fingernails, and toenails. All findings within normal limits unless otherwise noted below.  Right arm x 1, right neck x 1, left chest x 1, left hip x 2, legs x 49, right cheek x 1 - Total 55 (55) Erythematous keratotic or waxy stuck-on papule or plaque.   Left Ear Erythematous thin papules/macules with gritty scale.   Left medial knee 1.0 cm hyperkeratotic papule  Left mid to distal pretibial 1.0 cm hyperkeratotic papule   Assessment & Plan   Lentigines - Scattered tan macules - Due to sun exposure - Benign-appering, observe - Recommend daily broad spectrum sunscreen SPF 30+ to sun-exposed areas, reapply every 2 hours as needed. - Call for any changes  Seborrheic Keratoses - Stuck-on, waxy, tan-brown papules and/or plaques  - Benign-appearing - Discussed benign etiology and prognosis. - Observe - Call for any changes  Melanocytic Nevi - Tan-brown and/or pink-flesh-colored symmetric macules and papules - Benign appearing on exam today - Observation - Call clinic for new or  changing moles - Recommend daily use of broad spectrum spf 30+ sunscreen to sun-exposed areas.   Hemangiomas - Red papules - Discussed benign nature - Observe - Call for any changes  Actinic Damage - Chronic condition, secondary to cumulative UV/sun exposure - diffuse scaly erythematous macules with underlying dyspigmentation - Recommend daily broad spectrum sunscreen SPF 30+ to sun-exposed areas, reapply every 2 hours as needed.  - Staying in the shade or wearing long sleeves, sun glasses (UVA+UVB protection) and wide brim hats (4-inch brim around the entire circumference of the hat) are also recommended for sun protection.  - Call for new or changing lesions.  Skin cancer screening performed today.  Inflamed seborrheic keratosis Right arm x 1, right neck x 1, left chest x 1, left hip x 2, legs x 49, right cheek x 1 - Total 55  Destruction of lesion - Right arm x 1, right neck x 1, left chest x 1, left hip x 2, legs x 49, right cheek x 1 - Total 55 Complexity: simple   Destruction method: cryotherapy   Informed consent: discussed and consent obtained   Timeout:  patient name, date of birth, surgical site, and procedure verified Lesion destroyed using liquid nitrogen: Yes   Region frozen until ice ball extended beyond lesion: Yes   Outcome: patient tolerated procedure well with no complications   Post-procedure details: wound care instructions given    AK (actinic keratosis) Left Ear  Destruction of lesion - Left Ear Complexity: simple   Destruction method: cryotherapy   Informed consent: discussed and  consent obtained   Timeout:  patient name, date of birth, surgical site, and procedure verified Lesion destroyed using liquid nitrogen: Yes   Region frozen until ice ball extended beyond lesion: Yes   Outcome: patient tolerated procedure well with no complications   Post-procedure details: wound care instructions given    Neoplasm of uncertain behavior of skin (2) Left  medial knee  Epidermal / dermal shaving  Lesion diameter (cm):  1 Informed consent: discussed and consent obtained   Timeout: patient name, date of birth, surgical site, and procedure verified   Procedure prep:  Patient was prepped and draped in usual sterile fashion Prep type:  Isopropyl alcohol Anesthesia: the lesion was anesthetized in a standard fashion   Anesthetic:  1% lidocaine w/ epinephrine 1-100,000 buffered w/ 8.4% NaHCO3 Instrument used: flexible razor blade   Hemostasis achieved with: pressure, aluminum chloride and electrodesiccation   Outcome: patient tolerated procedure well   Post-procedure details: sterile dressing applied and wound care instructions given   Dressing type: bandage and petrolatum    Destruction of lesion Complexity: extensive   Destruction method: electrodesiccation and curettage   Informed consent: discussed and consent obtained   Timeout:  patient name, date of birth, surgical site, and procedure verified Procedure prep:  Patient was prepped and draped in usual sterile fashion Prep type:  Isopropyl alcohol Anesthesia: the lesion was anesthetized in a standard fashion   Anesthetic:  1% lidocaine w/ epinephrine 1-100,000 buffered w/ 8.4% NaHCO3 Curettage performed in three different directions: Yes   Electrodesiccation performed over the curetted area: Yes   Lesion length (cm):  1 Lesion width (cm):  1 Margin per side (cm):  0.2 Final wound size (cm):  1.4 Hemostasis achieved with:  pressure and aluminum chloride Outcome: patient tolerated procedure well with no complications   Post-procedure details: sterile dressing applied and wound care instructions given   Dressing type: bandage and petrolatum    Specimen 1 - Surgical pathology Differential Diagnosis: SCC vs other  Check Margins: No EDC today  Left mid to distal pretibial  Epidermal / dermal shaving  Lesion diameter (cm):  1 Informed consent: discussed and consent obtained    Timeout: patient name, date of birth, surgical site, and procedure verified   Procedure prep:  Patient was prepped and draped in usual sterile fashion Prep type:  Isopropyl alcohol Anesthesia: the lesion was anesthetized in a standard fashion   Anesthetic:  1% lidocaine w/ epinephrine 1-100,000 buffered w/ 8.4% NaHCO3 Instrument used: flexible razor blade   Hemostasis achieved with: pressure, aluminum chloride and electrodesiccation   Outcome: patient tolerated procedure well   Post-procedure details: sterile dressing applied and wound care instructions given   Dressing type: bandage and petrolatum    Destruction of lesion Complexity: extensive   Destruction method: electrodesiccation and curettage   Informed consent: discussed and consent obtained   Timeout:  patient name, date of birth, surgical site, and procedure verified Procedure prep:  Patient was prepped and draped in usual sterile fashion Prep type:  Isopropyl alcohol Anesthesia: the lesion was anesthetized in a standard fashion   Anesthetic:  1% lidocaine w/ epinephrine 1-100,000 buffered w/ 8.4% NaHCO3 Curettage performed in three different directions: Yes   Electrodesiccation performed over the curetted area: Yes   Lesion length (cm):  1 Lesion width (cm):  1 Margin per side (cm):  0.2 Final wound size (cm):  1.4 Hemostasis achieved with:  pressure and aluminum chloride Outcome: patient tolerated procedure well with no complications  Post-procedure details: sterile dressing applied and wound care instructions given   Dressing type: bandage and petrolatum    Specimen 2 - Surgical pathology Differential Diagnosis: SCC vs other  Check Margins: No EDC today  Skin cancer screening  SCC (squamous cell carcinoma), leg, left   Over 45 minutes spent in evaluation and treatment today.  Return in about 3 months (around 05/25/2021).  I, Ashok Cordia, CMA, am acting as scribe for Sarina Ser, MD . Documentation: I have  reviewed the above documentation for accuracy and completeness, and I agree with the above.  Sarina Ser, MD

## 2021-02-28 ENCOUNTER — Encounter: Payer: Self-pay | Admitting: Dermatology

## 2021-02-28 ENCOUNTER — Telehealth: Payer: Self-pay

## 2021-02-28 NOTE — Telephone Encounter (Signed)
Patient informed of pathology results 

## 2021-02-28 NOTE — Telephone Encounter (Signed)
-----   Message from Ralene Bathe, MD sent at 02/28/2021 11:14 AM EDT ----- Diagnosis 1. Skin , left medial knee WELL DIFFERENTIATED SQUAMOUS CELL CARCINOMA WITH SUPERFICIAL INFILTRATION, CLOSE TO MARGIN 2. Skin , left mid to distal pretibial WELL DIFFERENTIATED SQUAMOUS CELL CARCINOMA, BASE INVOLVED  1&2 - both Cancer - SCC Both already treated Recheck next visit

## 2021-03-13 DIAGNOSIS — H16223 Keratoconjunctivitis sicca, not specified as Sjogren's, bilateral: Secondary | ICD-10-CM | POA: Diagnosis not present

## 2021-03-13 DIAGNOSIS — H1013 Acute atopic conjunctivitis, bilateral: Secondary | ICD-10-CM | POA: Diagnosis not present

## 2021-03-13 DIAGNOSIS — H02831 Dermatochalasis of right upper eyelid: Secondary | ICD-10-CM | POA: Diagnosis not present

## 2021-03-13 DIAGNOSIS — H18413 Arcus senilis, bilateral: Secondary | ICD-10-CM | POA: Diagnosis not present

## 2021-03-14 ENCOUNTER — Encounter: Payer: Self-pay | Admitting: Internal Medicine

## 2021-03-14 ENCOUNTER — Other Ambulatory Visit: Payer: PPO

## 2021-03-14 ENCOUNTER — Ambulatory Visit: Payer: PPO | Admitting: Internal Medicine

## 2021-03-14 VITALS — BP 100/60 | HR 56 | Ht 66.75 in | Wt 150.5 lb

## 2021-03-14 DIAGNOSIS — R143 Flatulence: Secondary | ICD-10-CM | POA: Diagnosis not present

## 2021-03-14 DIAGNOSIS — R140 Abdominal distension (gaseous): Secondary | ICD-10-CM

## 2021-03-14 DIAGNOSIS — R3916 Straining to void: Secondary | ICD-10-CM

## 2021-03-14 DIAGNOSIS — R197 Diarrhea, unspecified: Secondary | ICD-10-CM

## 2021-03-14 DIAGNOSIS — E739 Lactose intolerance, unspecified: Secondary | ICD-10-CM | POA: Diagnosis not present

## 2021-03-14 DIAGNOSIS — R3911 Hesitancy of micturition: Secondary | ICD-10-CM

## 2021-03-14 NOTE — Patient Instructions (Addendum)
Your provider has requested that you go to the basement level for lab work before leaving today. Press "B" on the elevator. The lab is located at the first door on the left as you exit the elevator.  Due to recent changes in healthcare laws, you may see the results of your imaging and laboratory studies on MyChart before your provider has had a chance to review them.  We understand that in some cases there may be results that are confusing or concerning to you. Not all laboratory results come back in the same time frame and the provider may be waiting for multiple results in order to interpret others.  Please give [place] 48 hours in order for your provider to thoroughly review all the results before contacting the office for clarification of your results.   We are placing orders for pelvic floor P.T. and also for urogynecology eval. They will contact you.  I appreciate the opportunity to care for you. [name], MD, [facility]

## 2021-03-14 NOTE — Progress Notes (Signed)
[name] 75 y.o. [birth year] [id number]  Assessment & Plan:   Encounter Diagnoses  Name Primary?   Diarrhea, unspecified type Yes   Flatulence    Bloating    Urinary straining    Urinary hesitancy    Lactose intolerance     [name] continues to suffer with bowel issues that would be lumped into the IBS category overall.  Her collagenous colitis seems to be resolved she has a history of SIBO with persistent abnormal testing but antibiotic treatment never seem to provide significant relief.  Today I am wondering about the possibility of exocrine pancreatic insufficiency.  She has a lot of urinary issues which are worsening and with her bowel issues I think pelvic PT is worthwhile as well as a referral to urogynecology so we will do so.   Lab tests as outlined below.  Once I see these we will consider a trial of pancreatic enzymes, I still wonder about reassessing her SIBO versus retreating.  Hopefully I/we can find something that improves her quality of life.  In the meantime continue as needed Lomotil and VSL #3 which she says is helping somewhat.  She will continue to limit milk products.  Orders Placed This Encounter  Procedures   Calprotectin, Fecal   Fecal fat, qualitative   Pancreatic elastase, fecal   Ambulatory referral to Physical Therapy   Ambulatory referral to Urogynecology    One additional thought I have had since she left is whether or not she could have sucrase deficiency that is very rare but maybe we should test for that depending upon the above results.   05/19/2021 is her follow-up visit with me  I appreciate the opportunity to care for this patient. CC: Marinda Elk, MD  Subjective:   Chief Complaint: Diarrhea  HPI [name] is here for follow-up because she had another colonoscopy because of persistent diarrheal issues.  She has a history of SIBO and IBS-D and collagenous colitis.  She has never consistently responded to treatment for any of  this.  Colonoscopy done in June with random biopsies with no evidence of microscopic colitis or collagenous colitis at that time.  She reports today that using Lomotil will help relieve diarrhea if she is having 3 or 4 stools she will use that.  However then she will become constipated and have straining.  This makes leaving the house a big problem, quality of life is affected though she does get out.  She can never determine what type of food will trigger this there is no consistent triggers.  She will have urgent postprandial defecation at times with nausea and cramps.  She has had fecal incontinence issues at times.  Today when questioned about urinary symptoms that could go along with this she reports that she is having problems with urinary frequency and urge to urinate and having to go back to her 3 times to urinate and having incomplete urination.  She reports that her gas is terribly foul-smelling and bothersome.  She is on VSL #3 and that is helping somewhat.  Wt Readings from Last 3 Encounters:  03/14/21 150 lb 8 oz (68.3 kg)  01/05/21 149 lb (67.6 kg)  12/02/20 149 lb 9.6 oz (67.9 kg)   She is due to go back to [place] around Christmas time or after Christmas I think  Review of systems also positive for having 65 skin cancers treated recently. Allergies  Allergen Reactions   Latex    Adhesive [Tape] Rash  Aspirin Other (See Comments)    Aggravates Acid Reflux   Prednisone Palpitations    Made pts heart race   Current Meds  Medication Sig   ALPRAZolam (XANAX) 0.5 MG tablet Take 1 tablet by mouth at bedtime as needed.   Coenzyme Q10 (COQ10 PO) Take 1 tablet by mouth daily.   dicyclomine (BENTYL) 10 MG capsule Take 1 capsule (10 mg total) by mouth 3 (three) times daily between meals as needed for spasms.   diltiazem (CARDIZEM) 30 MG tablet Take 1 tablet (30 mg total) by mouth 3 (three) times daily as needed (As needed for atrial fibrillation episodes).   diphenoxylate-atropine  (LOMOTIL) 2.5-0.025 MG tablet Take 1 q am and then every 4 hours prn   escitalopram (LEXAPRO) 10 MG tablet Take 1 tablet (10 mg total) by mouth daily.   famciclovir (FAMVIR) 500 MG tablet Take 1 tablet (500 mg total) by mouth 2 (two) times daily. (Patient taking differently: Take 500 mg by mouth 2 (two) times daily as needed.)   famotidine (PEPCID) 20 MG tablet Take 20 mg by mouth daily.   flecainide (TAMBOCOR) 100 MG tablet Take 1 tablet (100 mg total) by mouth 2 (two) times daily.   ibandronate (BONIVA) 150 MG tablet Take 1 tablet (150 mg total) by mouth every 30 (thirty) days.   metoprolol succinate (TOPROL XL) 25 MG 24 hr tablet Take 1.5 tablets (37.5 mg total) by mouth daily.   rivaroxaban (XARELTO) 20 MG TABS tablet TAKE 1 TABLET BY MOUTH ONCE A DAY WITH SUPPER   rosuvastatin (CRESTOR) 5 MG tablet TAKE 1 TABLET BY MOUTH ONCE DAILY   Past Medical History:  Diagnosis Date   Actinic keratosis 07/13/2020   R lat thigh - bx proven    Arthritis    "fingers; right shoulder"   Atypical mole 12/10/2012   left sacral (severe)   Broken ribs    C. difficile diarrhea    Cataract    Clostridium difficile colitis 04/01/2016   Collagenous colitis    Coronary Calcium    a. 11/2014 Cardiac CT: Ca2+ score = 50 - all in prox LAD. 69th %'ile for age/sex; b. 05/2015 Ex MV: EF 73%. No infarct/ischemia-->Low risk.   Diverticulosis    Esophageal candidiasis (HCC)    GERD (gastroesophageal reflux disease)    Heart murmur    Hiatal hernia 2004   Hyperlipidemia    IBS (irritable bowel syndrome)    Internal hemorrhoids 2010   Colon-Dr. Vira Agar    Kidney stones    Midsternal chest pain    a. 01/2011 Ex Mv: EF 68%, No ischemia.   Migraine 01/16/2012   "none now for several years"   Mild Mitral regurgitation    a. 12/2011 Echo: EF 55-60%, no rwma, No evidence of MVP, Mild MR   Osteoporosis    PAF (paroxysmal atrial fibrillation) ([place])    a. diagnosed 12/2011-->s/p DCCV-->flecainide/Xarelto (CHA2DS2VASc  = 2).   Pancreas divisum of native pancreas    probable by CT   Pterygium eye, bilateral    Schatzki's ring 2010    EGD- Dr. Erven Colla intestinal bacterial overgrowth 06/25/2018   Squamous cell carcinoma of skin 03/31/2014   left mid pretibial, right mid pretibial sup, right mid pretibial inf   Squamous cell carcinoma of skin 05/19/2014   right ant pretibial inf   Squamous cell carcinoma of skin 09/05/2015   left dorsum wrist   Squamous cell carcinoma of skin 11/10/2015   left ant distal  thigh   Squamous cell carcinoma of skin 11/26/2017   right prox calf   Squamous cell carcinoma of skin 07/09/2018   left prox lat pretibial   Squamous cell carcinoma of skin 12/02/2018   left pretibial   Squamous cell carcinoma of skin 05/25/2020   R lower leg anterior    Squamous cell carcinoma of skin 07/13/2020   R ant thigh    Squamous cell carcinoma of skin 11/28/2020   Left distal lat pretibial near ankle - EDC   Squamous cell carcinoma of skin 02/22/2021   L med knee - ED&C   Squamous cell carcinoma of skin 02/22/2021   L mid to distal pretibial - ED&C   Past Surgical History:  Procedure Laterality Date   APPENDECTOMY  1963   BREAST CYST ASPIRATION Bilateral    "several; both sides"   CARDIOVERSION  01/16/2012   Procedure: CARDIOVERSION;  Surgeon: Evans Lance, MD;  Location: [facility];  Service: Cardiovascular;  Laterality: N/A;   COLONOSCOPY  Multiple   CYSTOSCOPY W/ STONE MANIPULATION  1980's   EYE SURGERY     x4   LEG SURGERY     skin cancer removal   OVARIAN CYST REMOVAL  1990's   SHOULDER ARTHROSCOPY W/ ROTATOR CUFF REPAIR Right ~ 2005   SQUAMOUS CELL CARCINOMA EXCISION     "10-12 removed; all over my body - nose, predominately legs"   [place] ENDOSCOPY  Multiple   w/dilation, hiatal hernia   VAGINAL HYSTERECTOMY  1972   Social History   Social History Narrative   Lives in [place] with husband.   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   Review of Systems  As above Objective:   Physical Exam BP 100/60 (BP Location: Left Arm, Patient Position: Sitting, Cuff Size: Normal)   Pulse (!) 56   Ht 5' 6.75" (1.695 m) Comment: height measured without shoes  Wt 150 lb 8 oz (68.3 kg)   BMI 23.75 kg/m  Well-developed well-nourished white woman in no acute distress    32 minutes total time spent on chart review time with the patient and formulation of the assessment and plan and documentation

## 2021-03-18 LAB — FECAL FAT, QUALITATIVE
Fat Qual Neutral, Stl: NORMAL
Fat Qual Total, Stl: NORMAL

## 2021-03-18 LAB — CALPROTECTIN, FECAL: Calprotectin, Fecal: 125 ug/g — ABNORMAL HIGH (ref 0–120)

## 2021-03-22 LAB — PANCREATIC ELASTASE, FECAL: Pancreatic Elastase-1, Stool: >500 mcg/g

## 2021-03-23 ENCOUNTER — Other Ambulatory Visit: Payer: Self-pay

## 2021-03-23 MED ORDER — METRONIDAZOLE 500 MG PO TABS: 500.0000 mg | ORAL_TABLET | Freq: Three times a day (TID) | ORAL | 0 refills | Status: DC

## 2021-03-30 ENCOUNTER — Other Ambulatory Visit: Payer: Self-pay

## 2021-03-30 ENCOUNTER — Other Ambulatory Visit: Payer: Self-pay | Admitting: Cardiovascular Disease

## 2021-03-30 MED ORDER — METOPROLOL SUCCINATE ER 25 MG PO TB24: 37.5000 mg | ORAL_TABLET | Freq: Every day | ORAL | 0 refills | Status: DC

## 2021-03-30 NOTE — Telephone Encounter (Signed)
Patient requesting samples of Xarelto '20mg'$  daily. Please advise if current dosage is correct to give. Thank you!

## 2021-03-30 NOTE — Telephone Encounter (Signed)
*  STAT* If patient is at the pharmacy, call can be transferred to refill team.   1. Which medications need to be refilled? (please list name of each medication and dose if known) Metoprolol  2. Which pharmacy/location (including street and city if local pharmacy) is medication to be sent to? [facility]  3. Do they need a 30 day or 90 day supply? 90   Patient would also like Xarelto samples.

## 2021-03-30 NOTE — Telephone Encounter (Signed)
Prescription SAMPLE request for Xarelto received. [facility] ARE WHAT I USED FOR THIS BUT THEY ARE REQUESTING SAMPLES AND NEEDS TO BE ROUTED BACK TO [facility] HOWEVER I MUST ROUTE TO PHARMD POOL AS THE LABS AND CREATININE CLEARANCE WAS BASED UPON [facility] Indication:AFIB Last office visit:WALKER 07/01/20 Weight:68.3KG Age:56F Scr: 0.800 mg/ 01/28/2020 OVERDUE  CrCl:65.5 BASED ON [facility]

## 2021-03-30 NOTE — Telephone Encounter (Signed)
Patient is due for BMP and CBC. Please have patient come in for CBC and BMP prior to giving samples, unless she she completely out. She MUST apply for patient assistance if cost is the issue. We cannot continue to give samples while patient is in the coverage gap. She may get 2 weeks of samples while waiting for patient assistance determination

## 2021-03-31 ENCOUNTER — Other Ambulatory Visit: Payer: Self-pay

## 2021-03-31 ENCOUNTER — Other Ambulatory Visit (INDEPENDENT_AMBULATORY_CARE_PROVIDER_SITE_OTHER): Payer: PPO

## 2021-03-31 ENCOUNTER — Telehealth: Payer: Self-pay

## 2021-03-31 DIAGNOSIS — Z79899 Other long term (current) drug therapy: Secondary | ICD-10-CM

## 2021-03-31 NOTE — Telephone Encounter (Signed)
Was able to reach out to [name], advised on need for updated labs in order to make sure she is on correct dosing of her Xarelto as per pharmacy.  Patient is due for BMP and CBC. Please have patient come in for CBC and BMP prior to giving samples, unless she she completely out. She MUST apply for patient assistance if cost is the issue. We cannot continue to give samples while patient is in the coverage gap. She may get 2 weeks of samples while waiting for patient assistance determination-PharmD  Pt reports she currently has supply for 10 days, agrees to have labs. Will come in today at 10 am to have CBC & BMP drawn. Advised that once PharmD has reviewed labs, pharmacy can adjust her Xarelto if needed and then samples may be given.

## 2021-04-01 LAB — CBC
Hematocrit: 38.8 % (ref 34.0–46.6)
Hemoglobin: 12.5 g/dL (ref 11.1–15.9)
MCH: 29.4 pg (ref 26.6–33.0)
MCHC: 32.2 g/dL (ref 31.5–35.7)
MCV: 91 fL (ref 79–97)
Platelets: 219 10*3/uL (ref 150–450)
RBC: 4.25 x10E6/uL (ref 3.77–5.28)
RDW: 13.2 % (ref 11.7–15.4)
WBC: 5.9 10*3/uL (ref 3.4–10.8)

## 2021-04-01 LAB — BASIC METABOLIC PANEL
BUN/Creatinine Ratio: 23 (ref 12–28)
BUN: 22 mg/dL (ref 8–27)
CO2: 26 mmol/L (ref 20–29)
Calcium: 9 mg/dL (ref 8.7–10.3)
Chloride: 103 mmol/L (ref 96–106)
Creatinine, Ser: 0.97 mg/dL (ref 0.57–1.00)
Glucose: 82 mg/dL (ref 65–99)
Potassium: 4.7 mmol/L (ref 3.5–5.2)
Sodium: 140 mmol/L (ref 134–144)
eGFR: 61 mL/min/{1.73_m2} (ref >59)

## 2021-04-03 MED ORDER — RIVAROXABAN 20 MG PO TABS: ORAL_TABLET | ORAL | 3 refills | Status: DC

## 2021-04-03 NOTE — Telephone Encounter (Signed)
Actual BW crcl =54 ml/min. Pt to continue Xarelto '20mg'$  daily

## 2021-04-03 NOTE — Addendum Note (Signed)
Addended by: Wynema Birch on: 04/03/2021 11:58 AM   Modules accepted: Orders

## 2021-04-03 NOTE — Telephone Encounter (Signed)
Called [name] to advised after pharmacy review, she is stay on Xarelto 20 mg daily.  2 bottles of sample for her to pick up at her convince Lot: [id number] Exp: 04/2023  Pt very thankful and will pick up her bottles this week.

## 2021-05-17 DIAGNOSIS — H1013 Acute atopic conjunctivitis, bilateral: Secondary | ICD-10-CM | POA: Diagnosis not present

## 2021-05-17 DIAGNOSIS — H18413 Arcus senilis, bilateral: Secondary | ICD-10-CM | POA: Diagnosis not present

## 2021-05-17 DIAGNOSIS — H16223 Keratoconjunctivitis sicca, not specified as Sjogren's, bilateral: Secondary | ICD-10-CM | POA: Diagnosis not present

## 2021-05-17 DIAGNOSIS — H16221 Keratoconjunctivitis sicca, not specified as Sjogren's, right eye: Secondary | ICD-10-CM | POA: Diagnosis not present

## 2021-05-17 DIAGNOSIS — H16222 Keratoconjunctivitis sicca, not specified as Sjogren's, left eye: Secondary | ICD-10-CM | POA: Diagnosis not present

## 2021-05-17 DIAGNOSIS — I10 Essential (primary) hypertension: Secondary | ICD-10-CM | POA: Diagnosis not present

## 2021-05-19 ENCOUNTER — Encounter: Payer: Self-pay | Admitting: Internal Medicine

## 2021-05-19 ENCOUNTER — Telehealth: Payer: Self-pay

## 2021-05-19 ENCOUNTER — Ambulatory Visit: Payer: PPO | Admitting: Internal Medicine

## 2021-05-19 VITALS — BP 110/62 | HR 60 | Ht 67.5 in | Wt 153.6 lb

## 2021-05-19 DIAGNOSIS — K582 Mixed irritable bowel syndrome: Secondary | ICD-10-CM

## 2021-05-19 DIAGNOSIS — R143 Flatulence: Secondary | ICD-10-CM

## 2021-05-19 DIAGNOSIS — R1319 Other dysphagia: Secondary | ICD-10-CM | POA: Diagnosis not present

## 2021-05-19 DIAGNOSIS — K6389 Other specified diseases of intestine: Secondary | ICD-10-CM | POA: Diagnosis not present

## 2021-05-19 MED ORDER — METRONIDAZOLE 500 MG PO TABS: 500.0000 mg | ORAL_TABLET | Freq: Three times a day (TID) | ORAL | 0 refills | Status: DC

## 2021-05-19 NOTE — Telephone Encounter (Signed)
[facility] Pre-operative Risk Assessment     Request for surgical clearance:     Endoscopy Procedure  What type of surgery is being performed?     EGD  When is this surgery scheduled?     07/05/21  What type of clearance is required ?   Pharmacy  Are there any medications that need to be held prior to surgery and how long? Xarelto, day before and day of  Practice name and name of physician performing surgery?      [facility]  What is your office phone and fax number?      Phone- [phone]  [phone]  Anesthesia type (None, local, MAC, general) ?       MAC

## 2021-05-19 NOTE — Progress Notes (Signed)
[name] 75 y.o. 11/24/1945 [id number]  Assessment & Plan:   Encounter Diagnoses  Name Primary?   Esophageal dysphagia Yes   Small intestinal bacterial overgrowth    Irritable bowel syndrome with both constipation and diarrhea    Flatulence    Evaluate and treat recurrent dysphagia with upper endoscopy and dilation as she has had over the years.    Hold Xarelto the day before and the day of procedure.  Clarify with cardiology.  Another round of metronidazole she said diarrhea is better so hopefully this will improve the flatulence as well i.e. treating her SIBO. Meds ordered this encounter  Medications   metroNIDAZOLE (FLAGYL) 500 MG tablet    Sig: Take 1 tablet (500 mg total) by mouth 3 (three) times daily.    Dispense:  30 tablet    Refill:  0   CC: Heather Elk, MD   Subjective:   Chief Complaint: Diarrhea and choking or dysphagia  HPI Patient was last seen in August at which point we did a fecal fat qualitative and a pancreatic elastase both of which were normal or negative.  Calprotectin was "borderline".  She was treated with metronidazole 500 mg 3 times daily given history of SIBO and recommended that she follow through with physical therapy of the pelvic floor.  Please see that August 23 note for further details of that time.  I have copied and pasted my assessment and plan from that day.  Assessment & Plan:        Encounter Diagnoses  Name Primary?   Diarrhea, unspecified type Yes   Flatulence     Bloating     Urinary straining     Urinary hesitancy     Lactose intolerance        [name] continues to suffer with bowel issues that would be lumped into the IBS category overall.  Her collagenous colitis seems to be resolved she has a history of SIBO with persistent abnormal testing but antibiotic treatment never seem to provide significant relief.  Today I am wondering about the possibility of exocrine pancreatic insufficiency.  She has a lot of  urinary issues which are worsening and with her bowel issues I think pelvic PT is worthwhile as well as a referral to urogynecology so we will do so.     Lab tests as outlined below.  Once I see these we will consider a trial of pancreatic enzymes, I still wonder about reassessing her SIBO versus retreating.  Hopefully I/we can find something that improves her quality of life.  In the meantime continue as needed Lomotil and VSL #3 which she says is helping somewhat.  She will continue to limit milk products additional thought was to consider the rare possibility of sucrase deficiency.  Today she says her bowel issues are about the same and she is complaining of dysphagia.  When questioned more closely diarrhea is improved but she still has malodorous gas.  She did start taking a fiber pill recently as the [place] doctor that did her husband's colonoscopy recommended he try it for something so she decided to try it and says her stools are more formed.  Recall also that in the interim she took a round of metronidazole for her SIBO.  Dysphagia is described as a chronic recurrent issue where she is now having more frequent solid food dysphagia with a suprasternal sticking point.  She also has some difficulty with pills though she takes a handful of those at  a time.   2017 normal EGD for dysphagia 54 French Maloney dilation performed.  She has had multiple others throughout the years performed by me or Dr. 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  May be worse than that "it is all over my eye".  She needs to have procedures from Dr. Lucita Clark where they remove the calcium deposits and then cover the cornea with placenta.  She is not sure when this would occur it depends on when the donated placenta comes in etc.  This is causing some stress.   Wt Readings from Last 3 Encounters:  05/19/21 153 lb 9.6 oz (69.7 kg)  03/14/21 150 lb 8  oz (68.3 kg)  01/05/21 149 lb (67.6 kg)    Allergies  Allergen Reactions   Latex    Adhesive [Tape] Rash   Aspirin Other (See Comments)    Aggravates Acid Reflux   Prednisone Palpitations    Made pts heart race   Current Meds  Medication Sig   ALPRAZolam (XANAX) 0.5 MG tablet Take 1 tablet by mouth at bedtime as needed.   Coenzyme Q10 (COQ10 PO) Take 1 tablet by mouth daily.   dicyclomine (BENTYL) 10 MG capsule Take 1 capsule (10 mg total) by mouth 3 (three) times daily between meals as needed for spasms.   diltiazem (CARDIZEM) 30 MG tablet Take 1 tablet (30 mg total) by mouth 3 (three) times daily as needed (As needed for atrial fibrillation episodes).   diphenoxylate-atropine (LOMOTIL) 2.5-0.025 MG tablet Take 1 q am and then every 4 hours prn   escitalopram (LEXAPRO) 10 MG tablet Take 1 tablet (10 mg total) by mouth daily.   EYSUVIS 0.25 % SUSP Apply 1 drop to eye 3 (three) times daily.   famciclovir (FAMVIR) 500 MG tablet Take 1 tablet (500 mg total) by mouth 2 (two) times daily. (Patient taking differently: Take 500 mg by mouth 2 (two) times daily as needed.)   famotidine (PEPCID) 20 MG tablet Take 20 mg by mouth daily.   flecainide (TAMBOCOR) 100 MG tablet Take 1 tablet (100 mg total) by mouth 2 (two) times daily.   FML LIQUIFILM 0.1 % ophthalmic suspension Place 1 drop into both eyes 4 (four) times daily.   ibandronate (BONIVA) 150 MG tablet Take 1 tablet (150 mg total) by mouth every 30 (thirty) days.   metoprolol succinate (TOPROL XL) 25 MG 24 hr tablet Take 1.5 tablets (37.5 mg total) by mouth daily.   metroNIDAZOLE (FLAGYL) 500 MG tablet Take 1 tablet (500 mg total) by mouth 3 (three) times daily.   rivaroxaban (XARELTO) 20 MG TABS tablet TAKE 1 TABLET BY MOUTH ONCE A DAY WITH SUPPER   rosuvastatin (CRESTOR) 5 MG tablet TAKE 1 TABLET BY MOUTH ONCE DAILY   Past Medical History:  Diagnosis Date   Actinic keratosis 07/13/2020   R lat thigh - bx proven    Arthritis     "fingers; right shoulder"   Atypical mole 12/10/2012   left sacral (severe)   Broken ribs    C. difficile diarrhea    Cataract    Clostridium difficile colitis 04/01/2016   Collagenous colitis    Coronary Calcium    a. 11/2014 Cardiac CT: Ca2+ score = 50 - all in prox LAD. 69th %'ile for age/sex; b. 05/2015 Ex MV: EF 73%. No infarct/ischemia-->Low risk.   Diverticulosis    Esophageal candidiasis (HCC)    GERD (gastroesophageal reflux  disease)    Heart murmur    Hiatal hernia 2004   Hyperlipidemia    IBS (irritable bowel syndrome)    Internal hemorrhoids 2010   Colon-Dr. Vira Agar    Kidney stones    Midsternal chest pain    a. 01/2011 Ex Mv: EF 68%, No ischemia.   Migraine 01/16/2012   "none now for several years"   Mild Mitral regurgitation    a. 12/2011 Echo: EF 55-60%, no rwma, No evidence of MVP, Mild MR   Osteoporosis    PAF (paroxysmal atrial fibrillation) ([place])    a. diagnosed 12/2011-->s/p DCCV-->flecainide/Xarelto (CHA2DS2VASc = 2).   Pancreas divisum of native pancreas    probable by CT   Pterygium eye, bilateral    Schatzki's ring 2010    EGD- Dr. Vira Agar    Small intestinal bacterial overgrowth 06/25/2018   Squamous cell carcinoma of skin 03/31/2014   left mid pretibial, right mid pretibial sup, right mid pretibial inf   Squamous cell carcinoma of skin 05/19/2014   right ant pretibial inf   Squamous cell carcinoma of skin 09/05/2015   left dorsum wrist   Squamous cell carcinoma of skin 11/10/2015   left ant distal thigh   Squamous cell carcinoma of skin 11/26/2017   right prox calf   Squamous cell carcinoma of skin 07/09/2018   left prox lat pretibial   Squamous cell carcinoma of skin 12/02/2018   left pretibial   Squamous cell carcinoma of skin 05/25/2020   R lower leg anterior    Squamous cell carcinoma of skin 07/13/2020   R ant thigh    Squamous cell carcinoma of skin 11/28/2020   Left distal lat pretibial near ankle - EDC   Squamous cell carcinoma of  skin 02/22/2021   L med knee - ED&C   Squamous cell carcinoma of skin 02/22/2021   L mid to distal pretibial - ED&C   Past Surgical History:  Procedure Laterality Date   APPENDECTOMY  1963   BREAST CYST ASPIRATION Bilateral    "several; both sides"   CARDIOVERSION  01/16/2012   Procedure: CARDIOVERSION;  Surgeon: Evans Lance, MD;  Location: [facility];  Service: Cardiovascular;  Laterality: N/A;   COLONOSCOPY  Multiple   CYSTOSCOPY W/ STONE MANIPULATION  1980's   EYE SURGERY     x4   LEG SURGERY     skin cancer removal   OVARIAN CYST REMOVAL  1990's   SHOULDER ARTHROSCOPY W/ ROTATOR CUFF REPAIR Right ~ 2005   [place]     "10-12 removed; all over my body - nose, predominately legs"   [place] ENDOSCOPY  Multiple   w/dilation, hiatal hernia   VAGINAL HYSTERECTOMY  1972   Social History   Social History Narrative   Lives in [place] with husband.   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   Review of Systems As above  Objective:   Physical Exam BP 110/62   Pulse 60   Ht 5' 7.5" (1.715 m)   Wt 153 lb  9.6 oz (69.7 kg)   BMI 23.70 kg/m  NAD Lungs cta Cor NL S1S2 no rmg

## 2021-05-19 NOTE — Patient Instructions (Addendum)
You have been scheduled for an endoscopy. Please follow written instructions given to you at your visit today. If you use inhalers (even only as needed), please bring them with you on the day of your procedure.  You will be contacted by our office prior to your procedure for directions on holding your Xarelto. If you do not hear from our office 1 week prior to your scheduled procedure, please call [phone] to discuss.    We have sent the following medications to your pharmacy for you to pick up at your convenience: Generic Flagyl  I appreciate the opportunity to care for you. [name], MD, [facility]

## 2021-05-22 ENCOUNTER — Telehealth: Payer: Self-pay

## 2021-05-22 ENCOUNTER — Other Ambulatory Visit: Payer: Self-pay | Admitting: Cardiovascular Disease

## 2021-05-22 NOTE — Progress Notes (Signed)
H&P  Chief Complaint: Urinary difficulty  History of Present Illness: [name] is a 75 y.o. year old female sent by Dr. [name] for evaluation and management of lower urinary tract symptoms.  For about a year she has had urinary frequency, urgency and occasional urgency incontinence.  She does not always feel like she empties out well.  She has had no consequent urinary tract infections.  No long-term urologic issues.  This is her first visit with the urologist.  She is seen for irritable bowel syndrome as well as esophageal problems.  Past Medical History:  Diagnosis Date   Actinic keratosis 07/13/2020   R lat thigh - bx proven    Arthritis    "fingers; right shoulder"   Atypical mole 12/10/2012   left sacral (severe)   Broken ribs    C. difficile diarrhea    Cataract    Clostridium difficile colitis 04/01/2016   Collagenous colitis    Coronary Calcium    a. 11/2014 Cardiac CT: Ca2+ score = 50 - all in prox LAD. 69th %'ile for age/sex; b. 05/2015 Ex MV: EF 73%. No infarct/ischemia-->Low risk.   Diverticulosis    Esophageal candidiasis (HCC)    GERD (gastroesophageal reflux disease)    Heart murmur    Hiatal hernia 2004   Hyperlipidemia    IBS (irritable bowel syndrome)    Internal hemorrhoids 2010   Colon-Dr. Vira Agar    Kidney stones    Midsternal chest pain    a. 01/2011 Ex Mv: EF 68%, No ischemia.   Migraine 01/16/2012   "none now for several years"   Mild Mitral regurgitation    a. 12/2011 Echo: EF 55-60%, no rwma, No evidence of MVP, Mild MR   Osteoporosis    PAF (paroxysmal atrial fibrillation) ([facility])    a. diagnosed 12/2011-->s/p DCCV-->flecainide/Xarelto (CHA2DS2VASc = 2).   Pancreas divisum of native pancreas    probable by CT   Pterygium eye, bilateral    Schatzki's ring 2010    EGD- Dr. Vira Agar    Small intestinal bacterial overgrowth 06/25/2018   Squamous cell carcinoma of skin 03/31/2014   left mid pretibial, right mid pretibial sup, right mid  pretibial inf   Squamous cell carcinoma of skin 05/19/2014   right ant pretibial inf   Squamous cell carcinoma of skin 09/05/2015   left dorsum wrist   Squamous cell carcinoma of skin 11/10/2015   left ant distal thigh   Squamous cell carcinoma of skin 11/26/2017   right prox calf   Squamous cell carcinoma of skin 07/09/2018   left prox lat pretibial   Squamous cell carcinoma of skin 12/02/2018   left pretibial   Squamous cell carcinoma of skin 05/25/2020   R lower leg anterior    Squamous cell carcinoma of skin 07/13/2020   R ant thigh    Squamous cell carcinoma of skin 11/28/2020   Left distal lat pretibial near ankle - EDC   Squamous cell carcinoma of skin 02/22/2021   L med knee - ED&C   Squamous cell carcinoma of skin 02/22/2021   L mid to distal pretibial - ED&C    Past Surgical History:  Procedure Laterality Date   APPENDECTOMY  1963   BREAST CYST ASPIRATION Bilateral    "several; both sides"   CARDIOVERSION  01/16/2012   Procedure: CARDIOVERSION;  Surgeon: Evans Lance, MD;  Location: [facility];  Service: Cardiovascular;  Laterality: N/A;   COLONOSCOPY  Multiple   CYSTOSCOPY W/ STONE MANIPULATION  1980's   EYE SURGERY     x4   LEG SURGERY     skin cancer removal   OVARIAN CYST REMOVAL  1990's   SHOULDER ARTHROSCOPY W/ ROTATOR CUFF REPAIR Right ~ 2005   SQUAMOUS CELL CARCINOMA EXCISION     "10-12 removed; all over my body - nose, predominately legs"   [facility] ENDOSCOPY  Multiple   w/dilation, hiatal hernia   VAGINAL HYSTERECTOMY  1972    Home Medications:  (Not in a hospital admission)   Allergies:  Allergies  Allergen Reactions   Latex    Adhesive [Tape] Rash   Aspirin Other (See Comments)    Aggravates Acid Reflux   Prednisone Palpitations    Made pts heart race    Family History  Problem Relation Age of Onset   Alzheimer's disease Mother        Died in [place] 61's.   Heart disease Father         A Fib and CHF - died in Mid 20   Diabetes Father    Kidney disease Father    Irritable bowel syndrome Father    Stomach cancer Neg Hx    Pancreatic cancer Neg Hx    Esophageal cancer Neg Hx    Colon cancer Neg Hx    Rectal cancer Neg Hx     Social History:  reports that she quit smoking about 9 years ago. Her smoking use included cigarettes. She has a 4.00 pack-year smoking history. She has never used smokeless tobacco. She reports that she does not currently use alcohol. She reports that she does not use drugs.  ROS: A complete review of systems was performed.  All systems are negative except for pertinent findings as noted.  Physical Exam:  Vital signs in last 24 hours: @VSRANGES @ General:  Alert and oriented, No acute distress HEENT: Normocephalic, atraumatic Neck: No JVD or lymphadenopathy Cardiovascular: Regular rate  Lungs: Normal inspiratory/expiratory excursion Abdomen: Soft, nontender, nondistended, no abdominal masses Back: No CVA tenderness Extremities: No edema Neurologic: Grossly intact  I have reviewed prior pt notes  I have reviewed notes from referring/previous physicians  I have reviewed urinalysis results  Bladder scanned today-residual urine volume 0   Impression/Assessment:  Overactive bladder-wet  Plan:  1.  I did discuss with her the fact that I do not think she has serious underlying issues, no further imaging/evaluation needed  2.  Overactive bladder guide sheet given  3.  I asked her to come back to see [place] if she has worsened difficulties  Jorja Loa 05/22/2021, 7:24 PM  Lillette Boxer.  MD

## 2021-05-22 NOTE — Telephone Encounter (Signed)
Patient informed that she can hold her xarelto the day prior to her EGD on 07/05/21 and the day of. She verbalized understanding.

## 2021-05-22 NOTE — Telephone Encounter (Signed)
    Patient Name: [name]  DOB: 1946-01-10 MRN: [id number]  Primary Cardiologist: Ida Rogue, MD  Chart reviewed as part of pre-operative protocol coverage.   Per pharmacy recommendations, patient can hold xarelto 1 day prior to to her upcoming endoscopy with plans to restart when cleared to do so by her gastroenterologist.   I will route this recommendation to the requesting party via Epic fax function and remove from pre-op pool.  Please call with questions.  Abigail Butts, PA-C 05/22/2021, 2:19 PM

## 2021-05-22 NOTE — Telephone Encounter (Signed)
Patient with diagnosis of afib on Xarelto for anticoagulation.    Procedure: EGD Date of procedure: 07/05/21  CHA2DS2-VASc Score = 4  This indicates a 4.8% annual risk of stroke. The patient's score is based upon: CHF History: 0 HTN History: 0 Diabetes History: 0 Stroke History: 0 Vascular Disease History: 1 Age Score: 2 Gender Score: 1   CrCl 64mL/min Platelet count 219K  Per office protocol, patient can hold Xarelto for 1 day prior to procedure and the day of as requested.

## 2021-05-23 ENCOUNTER — Ambulatory Visit: Payer: PPO | Admitting: Urology

## 2021-05-23 ENCOUNTER — Other Ambulatory Visit: Payer: Self-pay

## 2021-05-23 ENCOUNTER — Encounter: Payer: Self-pay | Admitting: Urology

## 2021-05-23 VITALS — BP 107/61 | HR 69 | Temp 98.3°F | Wt 151.6 lb

## 2021-05-23 DIAGNOSIS — R350 Frequency of micturition: Secondary | ICD-10-CM | POA: Diagnosis not present

## 2021-05-23 DIAGNOSIS — R3915 Urgency of urination: Secondary | ICD-10-CM

## 2021-05-23 DIAGNOSIS — M816 Localized osteoporosis [Lequesne]: Secondary | ICD-10-CM | POA: Diagnosis not present

## 2021-05-23 DIAGNOSIS — K219 Gastro-esophageal reflux disease without esophagitis: Secondary | ICD-10-CM | POA: Diagnosis not present

## 2021-05-23 DIAGNOSIS — R3916 Straining to void: Secondary | ICD-10-CM

## 2021-05-23 DIAGNOSIS — K52831 Collagenous colitis: Secondary | ICD-10-CM | POA: Diagnosis not present

## 2021-05-23 DIAGNOSIS — Z0000 Encounter for general adult medical examination without abnormal findings: Secondary | ICD-10-CM | POA: Diagnosis not present

## 2021-05-23 DIAGNOSIS — I482 Chronic atrial fibrillation, unspecified: Secondary | ICD-10-CM | POA: Diagnosis not present

## 2021-05-23 DIAGNOSIS — F419 Anxiety disorder, unspecified: Secondary | ICD-10-CM | POA: Diagnosis not present

## 2021-05-23 DIAGNOSIS — E785 Hyperlipidemia, unspecified: Secondary | ICD-10-CM | POA: Diagnosis not present

## 2021-05-23 LAB — URINALYSIS, ROUTINE W REFLEX MICROSCOPIC
Bilirubin, UA: NEGATIVE
Glucose, UA: NEGATIVE
Ketones, UA: NEGATIVE
Leukocytes,UA: NEGATIVE
Nitrite, UA: NEGATIVE
Protein,UA: NEGATIVE
RBC, UA: NEGATIVE
Specific Gravity, UA: >1.03 — ABNORMAL HIGH (ref 1.005–1.030)
Urobilinogen, Ur: 0.2 mg/dL (ref 0.2–1.0)
pH, UA: 5.5 (ref 5.0–7.5)

## 2021-05-23 NOTE — Progress Notes (Signed)
Urological Symptom Review  Patient is experiencing the following symptoms: Hard to postpone urination Leakage of urine Stream starts and stops Trouble starting stream Have to strain to urinate Weak stream   Review of Systems  Gastrointestinal (upper)  : Indigestion/heartburn  Gastrointestinal (lower) : Diarrhea  Constitutional : Negative for symptoms  Skin: Negative for skin symptoms  Eyes: Blurred vision  Ear/Nose/Throat : Negative for Ear/Nose/Throat symptoms  Hematologic/Lymphatic: Easy bruising  Cardiovascular : Negative for cardiovascular symptoms  Respiratory : Negative for respiratory symptoms  Endocrine: Negative for endocrine symptoms  Musculoskeletal: Negative for musculoskeletal symptoms  Neurological: Negative for neurological symptoms  Psychologic: Depression Anxiety

## 2021-05-26 ENCOUNTER — Telehealth: Payer: Self-pay

## 2021-05-26 NOTE — Telephone Encounter (Signed)
Returning samples to Med Room, Pt did not pick up samples  On 04/03/21  Called Mrs. Machorro to advised after pharmacy review, she is stay on Xarelto 20 mg daily.   2 bottles of sample for her to pick up at her convince Lot: [id number] Exp: 04/2023

## 2021-05-30 ENCOUNTER — Ambulatory Visit: Payer: PPO | Admitting: Dermatology

## 2021-06-05 DIAGNOSIS — J40 Bronchitis, not specified as acute or chronic: Secondary | ICD-10-CM | POA: Diagnosis not present

## 2021-06-05 DIAGNOSIS — R051 Acute cough: Secondary | ICD-10-CM | POA: Diagnosis not present

## 2021-07-05 ENCOUNTER — Encounter: Payer: PPO | Admitting: Internal Medicine

## 2021-07-11 ENCOUNTER — Ambulatory Visit: Payer: PPO | Admitting: Dermatology

## 2021-07-11 ENCOUNTER — Other Ambulatory Visit: Payer: Self-pay

## 2021-07-11 DIAGNOSIS — C44722 Squamous cell carcinoma of skin of right lower limb, including hip: Secondary | ICD-10-CM | POA: Diagnosis not present

## 2021-07-11 DIAGNOSIS — L578 Other skin changes due to chronic exposure to nonionizing radiation: Secondary | ICD-10-CM

## 2021-07-11 DIAGNOSIS — L820 Inflamed seborrheic keratosis: Secondary | ICD-10-CM

## 2021-07-11 DIAGNOSIS — Z85828 Personal history of other malignant neoplasm of skin: Secondary | ICD-10-CM

## 2021-07-11 DIAGNOSIS — L570 Actinic keratosis: Secondary | ICD-10-CM | POA: Diagnosis not present

## 2021-07-11 DIAGNOSIS — D485 Neoplasm of uncertain behavior of skin: Secondary | ICD-10-CM

## 2021-07-11 DIAGNOSIS — L720 Epidermal cyst: Secondary | ICD-10-CM | POA: Diagnosis not present

## 2021-07-11 NOTE — Progress Notes (Signed)
Follow-Up Visit   Subjective  [name] is a 75 y.o. female who presents for the following: Other (Scaly spots of arms and legs. She is leaving soon for [place] and will be gone for 3 months.). She has a small sore spot on the leg. The patient has spots, moles and lesions to be evaluated, some may be new or changing and the patient has concerns that these could be cancer.  The following portions of the chart were reviewed this encounter and updated as appropriate:   Tobacco   Allergies   Meds   Problems   Med Hx   Surg Hx   Fam Hx      Review of Systems:  No other skin or systemic complaints except as noted in HPI or Assessment and Plan.  Objective  Well appearing patient in no apparent distress; mood and affect are within normal limits.  A focused examination was performed including arms, legs. Relevant physical exam findings are noted in the Assessment and Plan.  Left medial upper eyelid, Right medial cheek/lat nose 0.4 cm Subcutaneous nodule of right medial cheek/lat nose. Subcutaneous nodule of left medial upper eyelid.  Right medial pretibial 1.2 cm hyperkeratotic papule  Right forarm x 1, legs x 5 (6) Erythematous stuck-on, waxy papule or plaque  Legs (12) Erythematous thin papules/macules with gritty scale.    Assessment & Plan   Actinic Damage - chronic, secondary to cumulative UV radiation exposure/sun exposure over time - diffuse scaly erythematous macules with underlying dyspigmentation - Recommend daily broad spectrum sunscreen SPF 30+ to sun-exposed areas, reapply every 2 hours as needed.  - Recommend staying in the shade or wearing long sleeves, sun glasses (UVA+UVB protection) and wide brim hats (4-inch brim around the entire circumference of the hat). - Call for new or changing lesions.  History of Squamous Cell Carcinoma of the Skin - No evidence of recurrence today - No lymphadenopathy - Recommend regular full body skin exams - Recommend daily  broad spectrum sunscreen SPF 30+ to sun-exposed areas, reapply every 2 hours as needed.  - Call if any new or changing lesions are noted between office visits  Epidermal inclusion cyst (2) Left medial upper eyelid; Right medial cheek/lat nose Discussed excision - do not recommend before her trip to [place].  She is planning to leave in the next several days to weeks.  May plan when she returns in the spring.  Neoplasm of uncertain behavior of skin Right medial pretibial Epidermal / dermal shaving  Lesion diameter (cm):  1.2 Informed consent: discussed and consent obtained   Timeout: patient name, date of birth, surgical site, and procedure verified   Procedure prep:  Patient was prepped and draped in usual sterile fashion Prep type:  Isopropyl alcohol Anesthesia: the lesion was anesthetized in a standard fashion   Anesthetic:  1% lidocaine w/ epinephrine 1-100,000 buffered w/ 8.4% NaHCO3 Instrument used: flexible razor blade   Hemostasis achieved with: pressure, aluminum chloride and electrodesiccation   Outcome: patient tolerated procedure well   Post-procedure details: sterile dressing applied and wound care instructions given   Dressing type: bandage and petrolatum    Destruction of lesion Complexity: extensive   Destruction method: electrodesiccation and curettage   Informed consent: discussed and consent obtained   Timeout:  patient name, date of birth, surgical site, and procedure verified Procedure prep:  Patient was prepped and draped in usual sterile fashion Prep type:  Isopropyl alcohol Anesthesia: the lesion was anesthetized in a standard fashion  Anesthetic:  1% lidocaine w/ epinephrine 1-100,000 buffered w/ 8.4% NaHCO3 Curettage performed in three different directions: Yes   Electrodesiccation performed over the curetted area: Yes   Lesion length (cm):  1.2 Lesion width (cm):  1.2 Margin per side (cm):  0.2 Final wound size (cm):  1.6 Hemostasis achieved with:   pressure and aluminum chloride Outcome: patient tolerated procedure well with no complications   Post-procedure details: sterile dressing applied and wound care instructions given   Dressing type: bandage and petrolatum    Specimen 1 - Surgical pathology Differential Diagnosis: SCC vs other  Check Margins: No EDC today  Inflamed seborrheic keratosis (6) Right forarm x 1, legs x 5 Destruction of lesion - Right forarm x 1, legs x 5 Complexity: simple   Destruction method: cryotherapy   Informed consent: discussed and consent obtained   Timeout:  patient name, date of birth, surgical site, and procedure verified Lesion destroyed using liquid nitrogen: Yes   Region frozen until ice ball extended beyond lesion: Yes   Outcome: patient tolerated procedure well with no complications   Post-procedure details: wound care instructions given    AK (actinic keratosis) (12) Legs Destruction of lesion - Legs Complexity: simple   Destruction method: cryotherapy   Informed consent: discussed and consent obtained   Timeout:  patient name, date of birth, surgical site, and procedure verified Lesion destroyed using liquid nitrogen: Yes   Region frozen until ice ball extended beyond lesion: Yes   Outcome: patient tolerated procedure well with no complications   Post-procedure details: wound care instructions given    Return in about 4 months (around 11/09/2021).  I, Ashok Cordia, CMA, am acting as scribe for Sarina Ser, MD . Documentation: I have reviewed the above documentation for accuracy and completeness, and I agree with the above.  Sarina Ser, MD

## 2021-07-11 NOTE — Patient Instructions (Signed)
Cryotherapy Aftercare  Wash gently with soap and water everyday.   Apply Vaseline and Band-Aid daily until healed.    Wound Care Instructions  Cleanse wound gently with soap and water once a day then pat dry with clean gauze. Apply a thing coat of Petrolatum (petroleum jelly, "Vaseline") over the wound (unless you have an allergy to this). We recommend that you use a new, sterile tube of Vaseline. Do not pick or remove scabs. Do not remove the yellow or white "healing tissue" from the base of the wound.  Cover the wound with fresh, clean, nonstick gauze and secure with paper tape. You may use Band-Aids in place of gauze and tape if the would is small enough, but would recommend trimming much of the tape off as there is often too much. Sometimes Band-Aids can irritate the skin.  You should call the office for your biopsy report after 1 week if you have not already been contacted.  If you experience any problems, such as abnormal amounts of bleeding, swelling, significant bruising, significant pain, or evidence of infection, please call the office immediately.  FOR ADULT SURGERY PATIENTS: If you need something for pain relief you may take 1 extra strength Tylenol (acetaminophen) AND 2 Ibuprofen (200mg each) together every 4 hours as needed for pain. (do not take these if you are allergic to them or if you have a reason you should not take them.) Typically, you may only need pain medication for 1 to 3 days.        If You Need Anything After Your Visit  If you have any questions or concerns for your doctor, please call our main line at [phone] and press option 4 to reach your doctor's medical assistant. If no one answers, please leave a voicemail as directed and we will return your call as soon as possible. Messages left after 4 pm will be answered the following business day.   You may also send us a message via MyChart. We typically respond to MyChart messages within 1-2 business  days.  For prescription refills, please ask your pharmacy to contact our office. Our fax number is [phone].  If you have an urgent issue when the clinic is closed that cannot wait until the next business day, you can page your doctor at the number below.    Please note that while we do our best to be available for urgent issues outside of office hours, we are not available 24/7.   If you have an urgent issue and are unable to reach us, you may choose to seek medical care at your doctor's office, retail clinic, urgent care center, or emergency room.  If you have a medical emergency, please immediately call 911 or go to the emergency department.  Pager Numbers  - Dr. Kowalski: [phone]  - Dr. Moye: [phone]  - Dr. Stewart: [phone]  In the event of inclement weather, please call our main line at [phone] for an update on the status of any delays or closures.  Dermatology Medication Tips: Please keep the boxes that topical medications come in in order to help keep track of the instructions about where and how to use these. Pharmacies typically print the medication instructions only on the boxes and not directly on the medication tubes.   If your medication is too expensive, please contact our office at [phone] option 4 or send us a message through MyChart.   We are unable to tell what your co-pay for medications will be   in advance as this is different depending on your insurance coverage. However, we may be able to find a substitute medication at lower cost or fill out paperwork to get insurance to cover a needed medication.   If a prior authorization is required to get your medication covered by your insurance company, please allow us 1-2 business days to complete this process.  Drug prices often vary depending on where the prescription is filled and some pharmacies may offer cheaper prices.  The website www.goodrx.com contains coupons for medications through  different pharmacies. The prices here do not account for what the cost may be with help from insurance (it may be cheaper with your insurance), but the website can give you the price if you did not use any insurance.  - You can print the associated coupon and take it with your prescription to the pharmacy.  - You may also stop by our office during regular business hours and pick up a GoodRx coupon card.  - If you need your prescription sent electronically to a different pharmacy, notify our office through [facility] MyChart or by phone at [phone] option 4.     Si Usted Necesita Algo Despus de Su Visita  Tambin puede enviarnos un mensaje a travs de MyChart. Por lo general respondemos a los mensajes de MyChart en el transcurso de 1 a 2 das hbiles.  Para renovar recetas, por favor pida a su farmacia que se ponga en contacto con nuestra oficina. Nuestro nmero de fax es el [phone].  Si tiene un asunto urgente cuando la clnica est cerrada y que no puede esperar hasta el siguiente da hbil, puede llamar/localizar a su doctor(a) al nmero que aparece a continuacin.   Por favor, tenga en cuenta que aunque hacemos todo lo posible para estar disponibles para asuntos urgentes fuera del horario de oficina, no estamos disponibles las 24 horas del da, los 7 das de la semana.   Si tiene un problema urgente y no puede comunicarse con nosotros, puede optar por buscar atencin mdica  en el consultorio de su doctor(a), en una clnica privada, en un centro de atencin urgente o en una sala de emergencias.  Si tiene una emergencia mdica, por favor llame inmediatamente al 911 o vaya a la sala de emergencias.  Nmeros de bper  - Dr. Kowalski: [phone]  - Dra. Moye: [phone]  - Dra. Stewart: [phone]  En caso de inclemencias del tiempo, por favor llame a nuestra lnea principal al [phone] para una actualizacin sobre el estado de cualquier retraso o cierre.  Consejos  para la medicacin en dermatologa: Por favor, guarde las cajas en las que vienen los medicamentos de uso tpico para ayudarle a seguir las instrucciones sobre dnde y cmo usarlos. Las farmacias generalmente imprimen las instrucciones del medicamento slo en las cajas y no directamente en los tubos del medicamento.   Si su medicamento es muy caro, por favor, pngase en contacto con nuestra oficina llamando al [phone] y presione la opcin 4 o envenos un mensaje a travs de MyChart.   No podemos decirle cul ser su copago por los medicamentos por adelantado ya que esto es diferente dependiendo de la cobertura de su seguro. Sin embargo, es posible que podamos encontrar un medicamento sustituto a menor costo o llenar un formulario para que el seguro cubra el medicamento que se considera necesario.   Si se requiere una autorizacin previa para que su compaa de seguros cubra su medicamento, por favor permtanos de 1 a   2 das hbiles para completar este proceso.  Los precios de los medicamentos varan con frecuencia dependiendo del lugar de dnde se surte la receta y alguna farmacias pueden ofrecer precios ms baratos.  El sitio web www.goodrx.com tiene cupones para medicamentos de diferentes farmacias. Los precios aqu no tienen en cuenta lo que podra costar con la ayuda del seguro (puede ser ms barato con su seguro), pero el sitio web puede darle el precio si no utiliz ningn seguro.  - Puede imprimir el cupn correspondiente y llevarlo con su receta a la farmacia.  - Tambin puede pasar por nuestra oficina durante el horario de atencin regular y recoger una tarjeta de cupones de GoodRx.  - Si necesita que su receta se enve electrnicamente a una farmacia diferente, informe a nuestra oficina a travs de MyChart de [facility] o por telfono llamando al [phone] y presione la opcin 4.  

## 2021-07-12 DIAGNOSIS — M816 Localized osteoporosis [Lequesne]: Secondary | ICD-10-CM | POA: Diagnosis not present

## 2021-07-13 ENCOUNTER — Ambulatory Visit (AMBULATORY_SURGERY_CENTER): Payer: PPO | Admitting: Internal Medicine

## 2021-07-13 ENCOUNTER — Encounter: Payer: Self-pay | Admitting: Internal Medicine

## 2021-07-13 VITALS — BP 114/54 | HR 50 | Temp 98.6°F | Resp 10 | Ht 67.0 in | Wt 153.0 lb

## 2021-07-13 DIAGNOSIS — R1310 Dysphagia, unspecified: Secondary | ICD-10-CM | POA: Diagnosis not present

## 2021-07-13 DIAGNOSIS — R1319 Other dysphagia: Secondary | ICD-10-CM | POA: Diagnosis not present

## 2021-07-13 MED ORDER — METRONIDAZOLE 500 MG PO TABS: 500.0000 mg | ORAL_TABLET | Freq: Three times a day (TID) | ORAL | 0 refills | Status: DC

## 2021-07-13 MED ORDER — SODIUM CHLORIDE 0.9 % IV SOLN: 500.0000 mL | Freq: Once | INTRAVENOUS | Status: DC

## 2021-07-13 NOTE — Op Note (Signed)
[facility] [place] Patient Name: [name] Procedure Date: 07/13/2021 10:39 AM MRN: [id number] Endoscopist: Gatha Mayer , MD Age: 75 Referring MD:  Date of Birth: [birth year] Gender: Female Account #: [id number] Procedure:                Upper GI endoscopy Indications:              Dysphagia Medicines:                Propofol per Anesthesia, Monitored Anesthesia Care Procedure:                Pre-Anesthesia Assessment:                           - Prior to the procedure, a History and Physical                            was performed, and patient medications and                            allergies were reviewed. The patient's tolerance of                            previous anesthesia was also reviewed. The risks                            and benefits of the procedure and the sedation                            options and risks were discussed with the patient.                            All questions were answered, and informed consent                            was obtained. Prior Anticoagulants: The patient                            last took Xarelto (rivaroxaban) 2 days prior to the                            procedure. ASA Grade Assessment: III - A patient                            with severe systemic disease. After reviewing the                            risks and benefits, the patient was deemed in                            satisfactory condition to undergo the procedure.                           After obtaining informed consent, the endoscope was  passed under direct vision. Throughout the                            procedure, the patient's blood pressure, pulse, and                            oxygen saturations were monitored continuously. The                            Endoscope was introduced through the mouth, and                            advanced to the second part of duodenum. The upper                            GI endoscopy  was accomplished without difficulty.                            The patient tolerated the procedure well. Scope In: Scope Out: Findings:                 No endoscopic abnormality was evident in the                            esophagus to explain the patient's complaint of                            dysphagia. It was decided, however, to proceed with                            dilation of the entire esophagus. The scope was                            withdrawn. Dilation was performed with a Maloney                            dilator with mild resistance at 41 Fr. The dilation                            site was examined and showed no change. Estimated                            blood loss was minimal - slight pharyngeal heme                           The exam was otherwise without abnormality.                           The cardia and gastric fundus were normal on                            retroflexion. Complications:            No immediate complications. Estimated Blood Loss:     Estimated blood  loss was minimal. Slight pharyngeal                            heme from maloney insertion Impression:               - No endoscopic esophageal abnormality to explain                            patient's dysphagia. Esophagus dilated. Dilated.                           - The examination was otherwise normal.                           - No specimens collected. Recommendation:           - Patient has a contact number available for                            emergencies. The signs and symptoms of potential                            delayed complications were discussed with the                            patient. Return to normal activities tomorrow.                            Written discharge instructions were provided to the                            patient.                           - Clear liquids x 1 hour then soft foods rest of                            day. Start prior diet tomorrow.                            - Resume Xarelto (rivaroxaban) at prior dose                            tomorrow.                           - I sent Rx for metronidazole to pharmacy to have                            on hand to retreat SIBO sxs if they recur Gatha Mayer, MD 07/13/2021 11:02:10 AM This report has been signed electronically.

## 2021-07-13 NOTE — Progress Notes (Signed)
Called to room to assist during endoscopic procedure.  Patient ID and intended procedure confirmed with present staff. Received instructions for my participation in the procedure from the performing physician.  

## 2021-07-13 NOTE — Progress Notes (Signed)
[facility] History and Physical   Primary Care Physician:  Marinda Elk, MD   Reason for Procedure:   dysphagia  Plan:    EGD     HPI: [name] is a 75 y.o. female here for evaluation and tx of dysphagia   Past Medical History:  Diagnosis Date   Actinic keratosis 07/13/2020   R lat thigh - bx proven    Arthritis    "fingers; right shoulder"   Atypical mole 12/10/2012   left sacral (severe)   Blood transfusion without reported diagnosis    Broken ribs    C. difficile diarrhea    Cataract    Clostridium difficile colitis 04/01/2016   Collagenous colitis    Coronary Calcium    a. 11/2014 Cardiac CT: Ca2+ score = 50 - all in prox LAD. 69th %'ile for age/sex; b. 05/2015 Ex MV: EF 73%. No infarct/ischemia-->Low risk.   Diverticulosis    Esophageal candidiasis (HCC)    GERD (gastroesophageal reflux disease)    Heart murmur    Hiatal hernia 2004   Hyperlipidemia    IBS (irritable bowel syndrome)    Internal hemorrhoids 2010   Colon-Dr. Vira Agar    Kidney stones    Midsternal chest pain    a. 01/2011 Ex Mv: EF 68%, No ischemia.   Migraine 01/16/2012   "none now for several years"   Mild Mitral regurgitation    a. 12/2011 Echo: EF 55-60%, no rwma, No evidence of MVP, Mild MR   Osteoporosis    PAF (paroxysmal atrial fibrillation) ([place])    a. diagnosed 12/2011-->s/p DCCV-->flecainide/Xarelto (CHA2DS2VASc = 2).   Pancreas divisum of native pancreas    probable by CT   Pterygium eye, bilateral    Schatzki's ring 2010    EGD- Dr. Vira Agar    Small intestinal bacterial overgrowth 06/25/2018   Squamous cell carcinoma of skin 03/31/2014   left mid pretibial, right mid pretibial sup, right mid pretibial inf   Squamous cell carcinoma of skin 05/19/2014   right ant pretibial inf   Squamous cell carcinoma of skin 09/05/2015   left dorsum wrist   Squamous cell carcinoma of skin 11/10/2015   left ant distal thigh   Squamous cell carcinoma of skin  11/26/2017   right prox calf   Squamous cell carcinoma of skin 07/09/2018   left prox lat pretibial   Squamous cell carcinoma of skin 12/02/2018   left pretibial   Squamous cell carcinoma of skin 05/25/2020   R lower leg anterior    Squamous cell carcinoma of skin 07/13/2020   R ant thigh    Squamous cell carcinoma of skin 11/28/2020   Left distal lat pretibial near ankle - EDC   Squamous cell carcinoma of skin 02/22/2021   L med knee - ED&C   Squamous cell carcinoma of skin 02/22/2021   L mid to distal pretibial - ED&C    Past Surgical History:  Procedure Laterality Date   APPENDECTOMY  1963   BREAST CYST ASPIRATION Bilateral    "several; both sides"   CARDIOVERSION  01/16/2012   Procedure: CARDIOVERSION;  Surgeon: Evans Lance, MD;  Location: [facility];  Service: Cardiovascular;  Laterality: N/A;   COLONOSCOPY  Multiple   CYSTOSCOPY W/ STONE MANIPULATION  1980's   EYE SURGERY     x4   LEG SURGERY     skin cancer removal   OVARIAN CYST REMOVAL  1990's   SHOULDER ARTHROSCOPY W/ ROTATOR CUFF REPAIR Right ~  2005   SQUAMOUS CELL CARCINOMA EXCISION     "10-12 removed; all over my body - nose, predominately legs"   [facility]  Multiple   w/dilation, hiatal hernia   VAGINAL HYSTERECTOMY  1972    Prior to Admission medications   Medication Sig Start Date End Date Taking? Authorizing Provider  ALPRAZolam Duanne Moron) 0.5 MG tablet Take 1 tablet by mouth at bedtime as needed. 01/09/21  Yes [provider]  Coenzyme Q10 (COQ10 PO) Take 1 tablet by mouth daily.   Yes [provider]  diphenoxylate-atropine (LOMOTIL) 2.5-0.025 MG tablet Take 1 q am and then every 4 hours prn 07/08/19  Yes Esterwood, Amy S, PA-C  escitalopram (LEXAPRO) 10 MG tablet Take 1 tablet (10 mg total) by mouth daily. 05/16/18  Yes Boscia, Heather E, NP  EYSUVIS 0.25 % SUSP Apply 1 drop to eye 3 (three) times daily. 03/13/21  Yes [provider]  famciclovir (FAMVIR) 500 MG tablet Take 1 tablet (500 mg total) by mouth 2 (two) times daily. Patient taking differently: Take 500 mg by mouth 2 (two) times daily as needed. 03/12/19  Yes Boscia, Greer Ee, NP  famotidine (PEPCID) 20 MG tablet Take 20 mg by mouth daily.   Yes [provider]  flecainide (TAMBOCOR) 100 MG tablet Take 1 tablet (100 mg total) by mouth 2 (two) times daily. 02/26/20  Yes Gollan, Kathlene November, MD  flecainide (TAMBOCOR) 50 MG tablet TAKE 1 AND 1/2 TABS BY MOUTH TWICE A DAY 05/22/21  Yes Gollan, Kathlene November, MD  FML LIQUIFILM 0.1 % ophthalmic suspension Place 1 drop into both eyes 4 (four) times daily. 05/17/21  Yes [provider]  ibandronate (BONIVA) 150 MG tablet Take 1 tablet (150 mg total) by mouth every 30 (thirty) days. 09/24/19  Yes Boscia, Greer Ee, NP  metoprolol succinate (TOPROL XL) 25 MG 24 hr tablet Take 1.5 tablets (37.5 mg total) by mouth daily. 03/30/21  Yes Gollan, Kathlene November, MD  rivaroxaban (XARELTO) 20 MG TABS tablet TAKE 1 TABLET BY MOUTH ONCE A DAY WITH SUPPER 04/03/21  Yes Gollan, Kathlene November, MD  rosuvastatin (CRESTOR) 5 MG tablet TAKE 1 TABLET BY MOUTH ONCE DAILY 07/18/20  Yes Gollan, Kathlene November, MD  dicyclomine (BENTYL) 10 MG capsule Take 1 capsule (10 mg total) by mouth 3 (three) times daily between meals as needed for spasms. 02/18/20   Esterwood, Amy S, PA-C  metroNIDAZOLE (FLAGYL) 500 MG tablet Take 1 tablet (500 mg total) by mouth 3 (three) times daily. 05/19/21   Gatha Mayer, MD  nitroGLYCERIN (NITROSTAT) 0.4 MG SL tablet Place 1 tablet (0.4 mg total) under the tongue every 5 (five) minutes as needed for chest pain. Patient not taking: Reported on 03/14/2021 12/10/16 03/14/21  Minna Merritts, MD    Current Outpatient Medications  Medication Sig Dispense Refill   ALPRAZolam (XANAX) 0.5 MG tablet Take 1 tablet by mouth at bedtime as needed.     Coenzyme Q10 (COQ10 PO) Take 1 tablet by mouth daily.      diphenoxylate-atropine (LOMOTIL) 2.5-0.025 MG tablet Take 1 q am and then every 4 hours prn 50 tablet 2   escitalopram (LEXAPRO) 10 MG tablet Take 1 tablet (10 mg total) by mouth daily. 90 tablet 3   EYSUVIS 0.25 % SUSP Apply 1 drop to eye 3 (three) times daily.     famciclovir (FAMVIR) 500 MG tablet Take 1 tablet (500 mg total) by mouth  2 (two) times daily. (Patient taking differently: Take 500 mg by mouth 2 (two) times daily as needed.) 30 tablet 0   famotidine (PEPCID) 20 MG tablet Take 20 mg by mouth daily.     flecainide (TAMBOCOR) 100 MG tablet Take 1 tablet (100 mg total) by mouth 2 (two) times daily. 180 tablet 3   flecainide (TAMBOCOR) 50 MG tablet TAKE 1 AND 1/2 TABS BY MOUTH TWICE A DAY 270 tablet 0   FML LIQUIFILM 0.1 % ophthalmic suspension Place 1 drop into both eyes 4 (four) times daily.     ibandronate (BONIVA) 150 MG tablet Take 1 tablet (150 mg total) by mouth every 30 (thirty) days. 1 tablet 0   metoprolol succinate (TOPROL XL) 25 MG 24 hr tablet Take 1.5 tablets (37.5 mg total) by mouth daily. 135 tablet 0   rivaroxaban (XARELTO) 20 MG TABS tablet TAKE 1 TABLET BY MOUTH ONCE A DAY WITH SUPPER 90 tablet 3   rosuvastatin (CRESTOR) 5 MG tablet TAKE 1 TABLET BY MOUTH ONCE DAILY 90 tablet 2   dicyclomine (BENTYL) 10 MG capsule Take 1 capsule (10 mg total) by mouth 3 (three) times daily between meals as needed for spasms. 90 capsule 0   nitroGLYCERIN (NITROSTAT) 0.4 MG SL tablet Place 1 tablet (0.4 mg total) under the tongue every 5 (five) minutes as needed for chest pain. (Patient not taking: Reported on 03/14/2021) 25 tablet 3   Current Facility-Administered Medications  Medication Dose Route Frequency Provider Last Rate Last Admin   0.9 %  sodium chloride infusion  500 mL Intravenous Once Gatha Mayer, MD        Allergies as of 07/13/2021 - Review Complete 07/13/2021  Allergen Reaction Noted   Latex  03/06/2012   Adhesive [tape] Rash 03/06/2012   Aspirin Other (See  Comments)    Prednisone Palpitations 06/08/2014    Family History  Problem Relation Age of Onset   Alzheimer's disease Mother        Died in [place] 33's.   Heart disease Father        A Fib and CHF - died in Mid 33   Diabetes Father    Kidney disease Father    Irritable bowel syndrome Father    Stomach cancer Neg Hx    Pancreatic cancer Neg Hx    Esophageal cancer Neg Hx    Colon cancer Neg Hx    Rectal cancer Neg Hx     Social History   Socioeconomic History   Marital status: Married    Spouse name: Not on file   Number of children: 1   Years of education: Not on file   Highest education level: Not on file  Occupational History   Occupation: retired  Tobacco Use   Smoking status: Former    Packs/day: 0.10    Years: 40.00    Pack years: 4.00    Types: Cigarettes    Quit date: 01/16/2012    Years since quitting: 9.4   Smokeless tobacco: Never  Vaping Use   Vaping Use: Never used  Substance and Sexual Activity   Alcohol use: Not Currently    Alcohol/week: 0.0 standard drinks   Drug use: No   Sexual activity: Not on file  Other Topics Concern   Not on file  Social History Narrative   Lives in [place] with husband.   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  Physical Exam: Vital signs BP 137/71    Pulse (!) 50    Temp 98.6 F (37 C)    Ht 5\' 7"  (1.702 m)    Wt 153 lb (69.4 kg)    SpO2 98%    BMI 23.96 kg/m   General:   Alert,  Well-developed, well-nourished, pleasant and cooperative in NAD Lungs:  Clear throughout to auscultation.   Heart:  Regular rate and rhythm; no murmurs, clicks, rubs,  or gallops. Abdomen:  Soft, nontender and nondistended. Normal bowel sounds.   Neuro/Psych:  Alert and  cooperative. Normal mood and affect. A and O x 3   @  Simonne Maffucci, MD, [facility] [phone] (pager) 07/13/2021 10:43 AM@

## 2021-07-13 NOTE — Patient Instructions (Addendum)
I dilated the esophagus like was done in 2017. Hope that helps you swallow better.  All else ok.  I sent a refill for the metronidazole to your pharmacy. If your SIBO symptoms act up take it again.  Restart Xarelto tomorrow.  I appreciate the opportunity to care for you. Gatha Mayer, MD, [facility]  Post-dilation diet handout provided.   YOU HAD AN ENDOSCOPIC PROCEDURE TODAY AT [facility]:   Refer to the procedure report that was given to you for any specific questions about what was found during the examination.  If the procedure report does not answer your questions, please call your gastroenterologist to clarify.  If you requested that your care partner not be given the details of your procedure findings, then the procedure report has been included in a sealed envelope for you to review at your convenience later.  YOU SHOULD EXPECT: Some feelings of bloating in the abdomen. Passage of more gas than usual.  Walking can help get rid of the air that was put into your GI tract during the procedure and reduce the bloating. If you had a lower endoscopy (such as a colonoscopy or flexible sigmoidoscopy) you may notice spotting of blood in your stool or on the toilet paper. If you underwent a bowel prep for your procedure, you may not have a normal bowel movement for a few days.  Please Note:  You might notice some irritation and congestion in your nose or some drainage.  This is from the oxygen used during your procedure.  There is no need for concern and it should clear up in a day or so.  SYMPTOMS TO REPORT IMMEDIATELY:  Following upper endoscopy (EGD)  Vomiting of blood or coffee ground material  New chest pain or pain under the shoulder blades  Painful or persistently difficult swallowing  New shortness of breath  Fever of 100F or higher  Black, tarry-looking stools  For urgent or emergent issues, a gastroenterologist can be reached at any hour by calling [phone]  [phone]. Do not use MyChart messaging for urgent concerns.    DIET:  Clear liquids for 1 hour. Then soft foods (see handout) the rest of today. Start prior diet tomorrow. Drink plenty of fluids but you should avoid alcoholic beverages for 24 hours.  ACTIVITY:  You should plan to take it easy for the rest of today and you should NOT DRIVE or use heavy machinery until tomorrow (because of the sedation medicines used during the test).    FOLLOW UP: Our staff will call the number listed on your records 48-72 hours following your procedure to check on you and address any questions or concerns that you may have regarding the information given to you following your procedure. If we do not reach you, we will leave a message.  We will attempt to reach you two times.  During this call, we will ask if you have developed any symptoms of COVID 19. If you develop any symptoms (ie: fever, flu-like symptoms, shortness of breath, cough etc.) before then, please call [phone].  If you test positive for Covid 19 in the 2 weeks post procedure, please call and report this information to [place].    If any biopsies were taken you will be contacted by phone or by letter within the next 1-3 weeks.  Please call us at [phone] if you have not heard about the biopsies in 3 weeks.    SIGNATURES/CONFIDENTIALITY: You and/or your care partner have signed  paperwork which will be entered into your electronic medical record.  These signatures attest to the fact that that the information above on your After Visit Summary has been reviewed and is understood.  Full responsibility of the confidentiality of this discharge information lies with you and/or your care-partner.

## 2021-07-13 NOTE — Progress Notes (Signed)
To PACU, VSS. Report to Rn,tb 

## 2021-07-13 NOTE — Progress Notes (Signed)
CW vitals and Kp IV.

## 2021-07-18 ENCOUNTER — Telehealth: Payer: Self-pay

## 2021-07-18 NOTE — Telephone Encounter (Signed)
°  Follow up Call-  Call back number 07/13/2021 01/05/2021  Post procedure Call Back phone  # [phone]  Permission to leave phone message Yes Yes  Some recent data might be hidden     Patient questions:  Do you have a fever, pain , or abdominal swelling? No. Pain Score  0 *  Have you tolerated food without any problems? Yes.    Have you been able to return to your normal activities? Yes.    Do you have any questions about your discharge instructions: Diet   No. Medications  No. Follow up visit  No.  Do you have questions or concerns about your Care? No.  Actions: * If pain score is 4 or above: No action needed, pain <4.

## 2021-07-19 ENCOUNTER — Telehealth: Payer: Self-pay

## 2021-07-19 NOTE — Telephone Encounter (Signed)
-----   Message from Ralene Bathe, MD sent at 07/13/2021  3:51 PM EST ----- Diagnosis Skin , right medial pretibial WELL DIFFERENTIATED SQUAMOUS CELL CARCINOMA, CLOSE TO MARGIN  Cancer - SCC Already treated Recheck next visit

## 2021-07-19 NOTE — Telephone Encounter (Signed)
Left message for patient to call office for results/hd 

## 2021-07-20 ENCOUNTER — Telehealth: Payer: Self-pay

## 2021-07-20 NOTE — Telephone Encounter (Signed)
Patient advised of BX results.  °

## 2021-07-20 NOTE — Telephone Encounter (Signed)
-----   Message from Ralene Bathe, MD sent at 07/13/2021  3:51 PM EST ----- Diagnosis Skin , right medial pretibial WELL DIFFERENTIATED SQUAMOUS CELL CARCINOMA, CLOSE TO MARGIN  Cancer - SCC Already treated Recheck next visit

## 2021-07-23 ENCOUNTER — Encounter: Payer: Self-pay | Admitting: Dermatology

## 2021-08-20 ENCOUNTER — Other Ambulatory Visit: Payer: Self-pay | Admitting: Cardiovascular Disease

## 2021-08-22 NOTE — Telephone Encounter (Signed)
Prescription refill request for Xarelto received.  Indication: PAF Last office visit: 07/01/20  Vella Raring NP [id number] Age: 76 Scr: 0.9 on 05/23/21 CrCl: 56.33  Based on above findings Xarelto 20mg  daily is the appropriate dose.  Pt is past due to see MD.  Message has already been sent to schedulers to make appt.  Refill approved x 1

## 2021-08-22 NOTE — Telephone Encounter (Signed)
Please contact pt for future appointment. Pt overdue for 12 month f/u. Pt needing refills. 

## 2021-08-22 NOTE — Telephone Encounter (Signed)
Refill Request.  

## 2021-08-23 ENCOUNTER — Telehealth: Payer: Self-pay | Admitting: Cardiovascular Disease

## 2021-08-23 DIAGNOSIS — L989 Disorder of the skin and subcutaneous tissue, unspecified: Secondary | ICD-10-CM | POA: Diagnosis not present

## 2021-08-23 DIAGNOSIS — M79604 Pain in right leg: Secondary | ICD-10-CM | POA: Diagnosis not present

## 2021-08-24 DIAGNOSIS — D485 Neoplasm of uncertain behavior of skin: Secondary | ICD-10-CM | POA: Diagnosis not present

## 2021-08-24 DIAGNOSIS — C44722 Squamous cell carcinoma of skin of right lower limb, including hip: Secondary | ICD-10-CM | POA: Diagnosis not present

## 2021-08-24 DIAGNOSIS — L578 Other skin changes due to chronic exposure to nonionizing radiation: Secondary | ICD-10-CM | POA: Diagnosis not present

## 2021-08-28 MED ORDER — METOPROLOL SUCCINATE ER 25 MG PO TB24: 37.5000 mg | ORAL_TABLET | Freq: Every day | ORAL | 0 refills | Status: DC

## 2021-08-28 NOTE — Telephone Encounter (Signed)
It looks like [facility] [place] on [facility]22[facility]21 however transmission failed. E-Prescribing Status: Transmission to pharmacy failed (08/22/2021  9:00 AM EST) Patient request for metoprolol succinate 1 tab daily.  Rx sent in for 1 tablet daily but per 07/01/20 AVS, patient to take 1.5 tabs daily. I do not see any changes made since then. Please advise on correct directions.  Thank you!

## 2021-08-28 NOTE — Telephone Encounter (Signed)
°*  STAT* If patient is at the pharmacy, call can be transferred to refill team.   1. Which medications need to be refilled? (please list name of each medication and dose if known)  metoprolol succinate 25 MG 1 tablet daily Xarelto 20 MG 1 tablet a day with supper   2. Which pharmacy/location (including street and city if local pharmacy) is medication to be sent to[facility] [place] [place] [facility] [phone] Patient is completely out of metoprolol  Won't be back to [place] til April - scheduled 11/07/21 with Dr Rockey Situ   3. Do they need a 30 day or 90 day supply? 90 day

## 2021-08-28 NOTE — Telephone Encounter (Signed)
Spoke with patient who states she takes 1.5 tablets of metoprolol succinate daily. Rx sent to [place] as requested.

## 2021-08-28 NOTE — Telephone Encounter (Signed)
Refill request for Xarelto 

## 2021-08-28 NOTE — Addendum Note (Signed)
Addended by: Raelene Bott,  L on: 08/28/2021 03:59 PM   Modules accepted: Orders

## 2021-08-30 ENCOUNTER — Telehealth: Payer: Self-pay

## 2021-08-30 NOTE — Telephone Encounter (Signed)
Patient called this morning regarding recurrent SCC on the leg. She is currently in [place] on vacation for 3 months and noticed the area was starting to regrow. She was seen by a dermatologist in the area who re did the biopsies and it did come back as SCC, which they want patient to go for [facility]. They also biopsied a second location that needs [facility] as well. Patient would like your opinion as if this could wait until she is back in [place] in April. She is very hesitant of having this done there 1) because she is on vacation and in and out of pools or hot tubs but also she is out of network in [place] and this would be out of pocket cost for her.   Advised patient to sign medical records release and have this information fax to [place] for you to review.

## 2021-08-30 NOTE — Telephone Encounter (Signed)
Left message for pt to return my call. aw

## 2021-08-31 NOTE — Telephone Encounter (Signed)
Patient advised of information per Dr. Kowalski. aw 

## 2021-09-04 ENCOUNTER — Other Ambulatory Visit: Payer: Self-pay

## 2021-09-04 NOTE — Telephone Encounter (Signed)
*  STAT* If patient is at the pharmacy, call can be transferred to refill team.   1. Which medications need to be refilled? (please list name of each medication and dose if known) Crestor  2. Which pharmacy/location (including street and city if local pharmacy) is medication to be sent to? [facility]  3. Do they need a 30 day or 90 day supply? [place]

## 2021-09-13 ENCOUNTER — Other Ambulatory Visit: Payer: Self-pay | Admitting: Cardiovascular Disease

## 2021-09-13 NOTE — Telephone Encounter (Signed)
Please resend pharmacy states not received

## 2021-09-15 ENCOUNTER — Other Ambulatory Visit: Payer: Self-pay | Admitting: Cardiovascular Disease

## 2021-10-09 DIAGNOSIS — C44722 Squamous cell carcinoma of skin of right lower limb, including hip: Secondary | ICD-10-CM | POA: Diagnosis not present

## 2021-10-11 DIAGNOSIS — C44722 Squamous cell carcinoma of skin of right lower limb, including hip: Secondary | ICD-10-CM | POA: Diagnosis not present

## 2021-11-01 ENCOUNTER — Encounter: Payer: PPO | Attending: Internal Medicine | Admitting: Internal Medicine

## 2021-11-01 DIAGNOSIS — Z923 Personal history of irradiation: Secondary | ICD-10-CM | POA: Diagnosis not present

## 2021-11-01 DIAGNOSIS — Z7901 Long term (current) use of anticoagulants: Secondary | ICD-10-CM | POA: Insufficient documentation

## 2021-11-01 DIAGNOSIS — C44722 Squamous cell carcinoma of skin of right lower limb, including hip: Secondary | ICD-10-CM | POA: Insufficient documentation

## 2021-11-01 DIAGNOSIS — T8131XD Disruption of external operation (surgical) wound, not elsewhere classified, subsequent encounter: Secondary | ICD-10-CM | POA: Insufficient documentation

## 2021-11-01 DIAGNOSIS — I499 Cardiac arrhythmia, unspecified: Secondary | ICD-10-CM | POA: Diagnosis not present

## 2021-11-01 DIAGNOSIS — L97812 Non-pressure chronic ulcer of other part of right lower leg with fat layer exposed: Secondary | ICD-10-CM | POA: Diagnosis not present

## 2021-11-01 DIAGNOSIS — W19XXXD Unspecified fall, subsequent encounter: Secondary | ICD-10-CM | POA: Diagnosis not present

## 2021-11-01 DIAGNOSIS — I480 Paroxysmal atrial fibrillation: Secondary | ICD-10-CM | POA: Diagnosis not present

## 2021-11-01 DIAGNOSIS — E785 Hyperlipidemia, unspecified: Secondary | ICD-10-CM | POA: Insufficient documentation

## 2021-11-01 DIAGNOSIS — I10 Essential (primary) hypertension: Secondary | ICD-10-CM | POA: Diagnosis not present

## 2021-11-01 DIAGNOSIS — L97818 Non-pressure chronic ulcer of other part of right lower leg with other specified severity: Secondary | ICD-10-CM | POA: Diagnosis not present

## 2021-11-01 DIAGNOSIS — T8131XA Disruption of external operation (surgical) wound, not elsewhere classified, initial encounter: Secondary | ICD-10-CM | POA: Diagnosis not present

## 2021-11-03 ENCOUNTER — Other Ambulatory Visit: Payer: Self-pay | Admitting: Physician Assistant

## 2021-11-06 ENCOUNTER — Encounter (HOSPITAL_BASED_OUTPATIENT_CLINIC_OR_DEPARTMENT_OTHER): Payer: PPO | Admitting: Internal Medicine

## 2021-11-06 DIAGNOSIS — S81801A Unspecified open wound, right lower leg, initial encounter: Secondary | ICD-10-CM | POA: Diagnosis not present

## 2021-11-07 ENCOUNTER — Ambulatory Visit: Payer: PPO | Admitting: Cardiovascular Disease

## 2021-11-07 NOTE — Progress Notes (Signed)
LAVERDA, STRIBLING ([id number]) ?Visit Report for 11/01/2021 ?Allergy List Details ?Patient Name: [name], [name] ?Date of Service: 11/01/2021 10:15 AM ?Medical Record Number: [id number] ?Patient Account Number: [id number] ?Date of Birth/Sex: 05-30-1946 (76 y.o. F) ?Treating RN: Carlene Coria ?Primary Care : Harold Barban Other Clinician: ?Referring : Referral, Self ?Treating /Extender: Ricard Dillon ?Weeks in Treatment: 0 ?Allergies ?Active Allergies ?aspirin ?latex ?prednisone ?adhesive tape ?Reaction: irritation/thin skin ?Severity: Mild ?Allergy Notes ?Electronic Signature(s) ?Signed: 11/07/2021 9:16:22 AM By: Carlene Coria RN ?Entered By: Carlene Coria on 11/01/2021 10:28:30 ?[name], [name] ([id number]) ?-------------------------------------------------------------------------------- ?Arrival Information Details ?Patient Name: [name], [name] ?Date of Service: 11/01/2021 10:15 AM ?Medical Record Number: [id number] ?Patient Account Number: [id number] ?Date of Birth/Sex: 03-Aug-1945 (76 y.o. F) ?Treating RN: Carlene Coria ?Primary Care : Harold Barban Other Clinician: ?Referring : Referral, Self ?Treating /Extender: Ricard Dillon ?Weeks in Treatment: 0 ?Visit Information ?Patient Arrived: Ambulatory ?Arrival Time: 10:26 ?Accompanied By: husband ?Transfer Assistance: None ?Patient Identification Verified: Yes ?Secondary Verification Process Completed: Yes ?Patient Requires Transmission-Based Precautions: No ?Patient Has Alerts: No ?History Since Last Visit ?All ordered tests and consults were completed: No ?Added or deleted any medications: No ?Any new allergies or adverse reactions: No ?Had a fall or experienced change in activities of daily living that may affect risk of falls: No ?Signs or symptoms of abuse/neglect since last visito No ?Hospitalized since last visit: No ?Implantable device outside of the clinic excluding cellular tissue based products placed in the  center since last visit: No ?Has Dressing in Place as Prescribed: Yes ?Pain Present Now: No ?Electronic Signature(s) ?Signed: 11/07/2021 9:16:22 AM By: Carlene Coria RN ?Entered By: Carlene Coria on 11/01/2021 10:27:21 ?[name], [name] ([id number]) ?-------------------------------------------------------------------------------- ?Clinic Level of Care Assessment Details ?Patient Name: [name], [name] ?Date of Service: 11/01/2021 10:15 AM ?Medical Record Number: [id number] ?Patient Account Number: [id number] ?Date of Birth/Sex: [birth year] (76 y.o. F) ?Treating RN: Carlene Coria ?Primary Care : Harold Barban Other Clinician: ?Referring : Referral, Self ?Treating /Extender: Ricard Dillon ?Weeks in Treatment: 0 ?Clinic Level of Care Assessment Items ?TOOL 1 Quantity Score ?X - Use when EandM and Procedure is performed on INITIAL visit 1 0 ?ASSESSMENTS - Nursing Assessment / Reassessment ?X - General Physical Exam (combine w/ comprehensive assessment (listed just below) when performed on new ?1 20 ?pt. evals) ?X- 1 25 ?Comprehensive Assessment (HX, ROS, Risk Assessments, Wounds Hx, etc.) ?ASSESSMENTS - Wound and Skin Assessment / Reassessment ?'[]'$  - Dermatologic / Skin Assessment (not related to wound area) 0 ?ASSESSMENTS - Ostomy and/or Continence Assessment and Care ?'[]'$  - Incontinence Assessment and Management 0 ?'[]'$  - 0 ?Ostomy Care Assessment and Management (repouching, etc.) ?PROCESS - Coordination of Care ?X - Simple Patient / Family Education for ongoing care 1 15 ?'[]'$  - 0 ?Complex (extensive) Patient / Family Education for ongoing care ?'[]'$  - 0 ?Staff obtains Consents, Records, Test Results / Process Orders ?'[]'$  - 0 ?Staff telephones HHA, Nursing Homes / Clarify orders / etc ?'[]'$  - 0 ?Routine Transfer to another Facility (non-emergent condition) ?'[]'$  - 0 ?Routine Hospital Admission (non-emergent condition) ?X- 1 15 ?New Admissions / Biomedical engineer / Ordering NPWT, Apligraf, etc. ?'[]'$  -  0 ?Emergency Hospital Admission (emergent condition) ?PROCESS - Special Needs ?'[]'$  - Pediatric / Minor Patient Management 0 ?'[]'$  - 0 ?Isolation Patient Management ?'[]'$  - 0 ?Hearing / Language / Visual special needs ?'[]'$  - 0 ?Assessment of Community assistance (transportation, D/C planning, etc.) ?'[]'$  - 0 ?Additional assistance / Altered mentation ?'[]'$  - 0 ?  Support Surface(s) Assessment (bed, cushion, seat, etc.) ?INTERVENTIONS - Miscellaneous ?'[]'$  - External ear exam 0 ?'[]'$  - 0 ?Patient Transfer (multiple staff / Civil Service fast streamer / Similar devices) ?'[]'$  - 0 ?Simple Staple / Suture removal (25 or less) ?'[]'$  - 0 ?Complex Staple / Suture removal (26 or more) ?'[]'$  - 0 ?Hypo/Hyperglycemic Management (do not check if billed separately) ?X- 1 15 ?Ankle / Brachial Index (ABI) - do not check if billed separately ?Has the patient been seen at the hospital within the last three years: Yes ?Total Score: 90 ?Level Of Care: New/Established - Level ?3 ?[name], [name] ([id number]) ?Electronic Signature(s) ?Signed: 11/07/2021 9:16:22 AM By: Carlene Coria RN ?Entered By: Carlene Coria on 11/01/2021 11:10:04 ?[name], [name] ([id number]) ?-------------------------------------------------------------------------------- ?Encounter Discharge Information Details ?Patient Name: [name], [name] ?Date of Service: 11/01/2021 10:15 AM ?Medical Record Number: [id number] ?Patient Account Number: [id number] ?Date of Birth/Sex: 1945-08-12 (76 y.o. F) ?Treating RN: Carlene Coria ?Primary Care : Harold Barban Other Clinician: ?Referring : Referral, Self ?Treating /Extender: Ricard Dillon ?Weeks in Treatment: 0 ?Encounter Discharge Information Items Post Procedure Vitals ?Discharge Condition: Stable ?Temperature (?F): 98.1 ?Ambulatory Status: Ambulatory ?Pulse (bpm): 63 ?Discharge Destination: Home ?Respiratory Rate (breaths/min): 18 ?Transportation: Private Auto ?Blood Pressure (mmHg): 105/65 ?Accompanied By: husband ?Schedule Follow-up  Appointment: Yes ?Clinical Summary of Care: Patient Declined ?Electronic Signature(s) ?Signed: 11/01/2021 11:43:46 AM By: Carlene Coria RN ?Entered By: Carlene Coria on 11/01/2021 11:43:46 ?[name], [name] ([id number]) ?-------------------------------------------------------------------------------- ?Lower Extremity Assessment Details ?Patient Name: [name], [name] ?Date of Service: 11/01/2021 10:15 AM ?Medical Record Number: [id number] ?Patient Account Number: [id number] ?Date of Birth/Sex: 01/13/1946 (76 y.o. F) ?Treating RN: Carlene Coria ?Primary Care : Harold Barban Other Clinician: ?Referring : Referral, Self ?Treating /Extender: Ricard Dillon ?Weeks in Treatment: 0 ?Edema Assessment ?Assessed: [Left: No] [Right: No] ?Edema: [Left: N] [Right: o] ?Calf ?Left: Right: ?Point of Measurement: 35 cm From Medial Instep 32 cm ?Ankle ?Left: Right: ?Point of Measurement: 10 cm From Medial Instep 21 cm ?Knee To Floor ?Left: Right: ?From Medial Instep 44 cm ?Vascular Assessment ?Pulses: ?Dorsalis Pedis ?Palpable: [Right:Yes] ?Blood Pressure: ?Brachial: [Right:105] ?Ankle: ?[Right:Dorsalis Pedis: 100 0.95] ?Electronic Signature(s) ?Signed: 11/07/2021 9:16:22 AM By: Carlene Coria RN ?Entered By: Carlene Coria on 11/01/2021 10:59:04 ?[name], [name] ([id number]) ?-------------------------------------------------------------------------------- ?Multi Wound Chart Details ?Patient Name: [name], [name] ?Date of Service: 11/01/2021 10:15 AM ?Medical Record Number: [id number] ?Patient Account Number: [id number] ?Date of Birth/Sex: 05-23-1946 (76 y.o. F) ?Treating RN: Carlene Coria ?Primary Care : Harold Barban Other Clinician: ?Referring : Referral, Self ?Treating /Extender: Ricard Dillon ?Weeks in Treatment: 0 ?Vital Signs ?Height(in): 67 ?Pulse(bpm): 63 ?Weight(lbs): 151 ?Blood Pressure(mmHg): 105/65 ?Body Mass Index(BMI): 23.6 ?Temperature(??F): 98.1 ?Respiratory Rate(breaths/min):  18 ?Photos: [N/A:N/A] ?Wound Location: Right Lower Leg Right, Medial Lower Leg N/A ?Wounding Event: Surgical Injury Gradually Appeared N/A ?Primary Etiology: Open Surgical Wound Open Surgical Wound N/A ?Comorb

## 2021-11-07 NOTE — Progress Notes (Signed)
DESSIE, TATEM ([id number]) ?Visit Report for 11/01/2021 ?Abuse Risk Screen Details ?Patient Name: [name], [name] ?Date of Service: 11/01/2021 10:15 AM ?Medical Record Number: [id number] ?Patient Account Number: [id number] ?Date of Birth/Sex: 1946/02/26 (76 y.o. F) ?Treating RN: Carlene Coria ?Primary Care : Harold Barban Other Clinician: ?Referring : Referral, Self ?Treating /Extender: Ricard Dillon ?Weeks in Treatment: 0 ?Abuse Risk Screen Items ?Answer ?ABUSE RISK SCREEN: ?Has anyone close to you tried to hurt or harm you recentlyo No ?Do you feel uncomfortable with anyone in your familyo No ?Has anyone forced you do things that you didnot want to doo No ?Electronic Signature(s) ?Signed: 11/07/2021 9:16:22 AM By: Carlene Coria RN ?Entered By: Carlene Coria on 11/01/2021 10:29:18 ?SANNA, PORCARO ([id number]) ?-------------------------------------------------------------------------------- ?Activities of Daily Living Details ?Patient Name: [name], [name] ?Date of Service: 11/01/2021 10:15 AM ?Medical Record Number: [id number] ?Patient Account Number: [id number] ?Date of Birth/Sex: 1946-03-12 (76 y.o. F) ?Treating RN: Carlene Coria ?Primary Care : Harold Barban Other Clinician: ?Referring : Referral, Self ?Treating /Extender: Ricard Dillon ?Weeks in Treatment: 0 ?Activities of Daily Living Items ?Answer ?Activities of Daily Living (Please select one for each item) ?[place] ?Take Medications Completely Able ?Use Telephone Completely Able ?Care for Appearance Completely Able ?Use Toilet Completely Able ?Bath / Shower Completely Able ?Dress Self Completely Able ?Feed Self Completely Able ?Walk Completely Able ?Get In / Out Bed Completely Able ?Housework Completely Able ?Prepare Meals Completely Able ?Handle Money Completely Able ?Shop for Self Completely Able ?Electronic Signature(s) ?Signed: 11/07/2021 9:16:22 AM By: Carlene Coria RN ?Entered  By: Carlene Coria on 11/01/2021 10:29:43 ?[name], [name] ([id number]) ?-------------------------------------------------------------------------------- ?Education Screening Details ?Patient Name: [name], [name] ?Date of Service: 11/01/2021 10:15 AM ?Medical Record Number: [id number] ?Patient Account Number: [id number] ?Date of Birth/Sex: 31-Dec-1945 (76 y.o. F) ?Treating RN: Carlene Coria ?Primary Care : Harold Barban Other Clinician: ?Referring : Referral, Self ?Treating /Extender: Ricard Dillon ?Weeks in Treatment: 0 ?Primary Learner Assessed: Patient ?Learning Preferences/Education Level/Primary Language ?Learning Preference: Explanation ?Highest Education Level: High School ?Preferred Language: English ?Cognitive Barrier ?Language Barrier: No ?Translator Needed: No ?Memory Deficit: No ?Emotional Barrier: No ?Cultural/Religious Beliefs Affecting Medical Care: No ?Physical Barrier ?Impaired Vision: No ?Impaired Hearing: No ?Decreased Hand dexterity: No ?Knowledge/Comprehension ?Knowledge Level: Medium ?Comprehension Level: High ?Ability to understand written instructions: High ?Ability to understand verbal instructions: High ?Motivation ?Anxiety Level: Anxious ?Cooperation: Cooperative ?Education Importance: Acknowledges Need ?Interest in Health Problems: Asks Questions ?Perception: Coherent ?Willingness to Engage in Self-Management ?High ?Activities: ?Readiness to Engage in Self-Management ?High ?Activities: ?Electronic Signature(s) ?Signed: 11/07/2021 9:16:22 AM By: Carlene Coria RN ?Entered By: Carlene Coria on 11/01/2021 10:30:08 ?[name], [name] ([id number]) ?-------------------------------------------------------------------------------- ?Fall Risk Assessment Details ?Patient Name: [name], [name] ?Date of Service: 11/01/2021 10:15 AM ?Medical Record Number: [id number] ?Patient Account Number: [id number] ?Date of Birth/Sex: Apr 16, 1946 (76 y.o. F) ?Treating RN: Carlene Coria ?Primary Care  : Harold Barban Other Clinician: ?Referring : Referral, Self ?Treating /Extender: Ricard Dillon ?Weeks in Treatment: 0 ?Fall Risk Assessment Items ?Have you had 2 or more falls in the last 12 monthso 0 No ?Have you had any fall that resulted in injury in the last 12 monthso 0 No ?FALLS RISK SCREEN ?History of falling - immediate or within 3 months 0 No ?Secondary diagnosis (Do you have 2 or more medical diagnoseso) 0 No ?Ambulatory aid ?None/bed rest/wheelchair/nurse 0 No ?Crutches/cane/walker 0 No ?Furniture 0 No ?Intravenous therapy Access/Saline/Heparin Lock 0 No ?Gait/Transferring ?Normal/ bed rest/ wheelchair 0 No ?Weak (  short steps with or without shuffle, stooped but able to lift head while walking, may ?0 No ?seek support from furniture) ?Impaired (short steps with shuffle, may have difficulty arising from chair, head down, impaired ?0 No ?balance) ?Mental Status ?Oriented to own ability 0 No ?Electronic Signature(s) ?Signed: 11/07/2021 9:16:22 AM By: Carlene Coria RN ?Entered By: Carlene Coria on 11/01/2021 10:30:15 ?[name], [name] ([id number]) ?-------------------------------------------------------------------------------- ?Foot Assessment Details ?Patient Name: [name], [name] ?Date of Service: 11/01/2021 10:15 AM ?Medical Record Number: [id number] ?Patient Account Number: [id number] ?Date of Birth/Sex: [birth year] (76 y.o. F) ?Treating RN: Carlene Coria ?Primary Care : Harold Barban Other Clinician: ?Referring : Referral, Self ?Treating /Extender: Ricard Dillon ?Weeks in Treatment: 0 ?Foot Assessment Items ?Site Locations ?+ = Sensation present, - = Sensation absent, C = Callus, U = Ulcer ?R = Redness, W = Warmth, M = Maceration, PU = Pre-ulcerative lesion ?F = Fissure, S = Swelling, D = Dryness ?Assessment ?Right: Left: ?Other Deformity: No No ?Prior Foot Ulcer: No No ?Prior Amputation: No No ?Charcot Joint: No No ?Ambulatory Status: Ambulatory  Without Help ?Gait: Steady ?Electronic Signature(s) ?Signed: 11/07/2021 9:16:22 AM By: Carlene Coria RN ?Entered By: Carlene Coria on 11/01/2021 10:31:31 ?[name], [name] ([id number]) ?-------------------------------------------------------------------------------- ?Nutrition Risk Screening Details ?Patient Name: [name], [name] ?Date of Service: 11/01/2021 10:15 AM ?Medical Record Number: [id number] ?Patient Account Number: [id number] ?Date of Birth/Sex: 05-12-1946 (76 y.o. F) ?Treating RN: Carlene Coria ?Primary Care : Harold Barban Other Clinician: ?Referring : Referral, Self ?Treating /Extender: Ricard Dillon ?Weeks in Treatment: 0 ?Height (in): 67 ?Weight (lbs): 151 ?Body Mass Index (BMI): 23.6 ?Nutrition Risk Screening Items ?Score Screening ?NUTRITION RISK SCREEN: ?I have an illness or condition that made me change the kind and/or amount of food I eat 0 No ?I eat fewer than two meals per day 0 No ?I eat few fruits and vegetables, or milk products 0 No ?I have three or more drinks of beer, liquor or wine almost every day 0 No ?I have tooth or mouth problems that make it hard for me to eat 0 No ?I don't always have enough money to buy the food I need 0 No ?I eat alone most of the time 0 No ?I take three or more different prescribed or over-the-counter drugs a day 1 Yes ?Without wanting to, I have lost or gained 10 pounds in the last six months 0 No ?I am not always physically able to shop, cook and/or feed myself 0 No ?Nutrition Protocols ?Good Risk Protocol 0 No interventions needed ?Moderate Risk Protocol ?High Risk Proctocol ?Risk Level: Good Risk ?Score: 1 ?Electronic Signature(s) ?Signed: 11/07/2021 9:16:22 AM By: Carlene Coria RN ?Entered By: Carlene Coria on 11/01/2021 10:30:25 ?

## 2021-11-07 NOTE — Progress Notes (Signed)
Heather, Clark ([id number]) ?Visit Report for 11/01/2021 ?Chief Complaint Document Details ?Patient Name: [name], [name] ?Date of Service: 11/01/2021 10:15 AM ?Medical Record Number: [id number] ?Patient Account Number: [id number] ?Date of Birth/Sex: 02-Dec-1945 (76 y.o. F) ?Treating RN: Carlene Coria ?Primary Care Provider: Harold Barban Other Clinician: ?Referring Provider: Referral, Self ?Treating Provider/Extender: Ricard Dillon ?Weeks in Treatment: 0 ?Information Obtained from: Patient ?Chief Complaint ?11/01/2021; patient returns to clinic with 2 wounds on the right anterior lower leg. These are secondary to surgical extractions of squamous cell ?carcinomas ?Electronic Signature(s) ?Signed: 11/02/2021 7:47:50 AM By: Linton Ham MD ?Entered By: Linton Ham on 11/01/2021 11:56:24 ?MEHGAN, SANTMYER ([id number]) ?-------------------------------------------------------------------------------- ?Debridement Details ?Patient Name: Heather, Clark ?Date of Service: 11/01/2021 10:15 AM ?Medical Record Number: [id number] ?Patient Account Number: [id number] ?Date of Birth/Sex: Apr 16, 1946 (76 y.o. F) ?Treating RN: Carlene Coria ?Primary Care Provider: Harold Barban Other Clinician: ?Referring Provider: Referral, Self ?Treating Provider/Extender: Ricard Dillon ?Weeks in Treatment: 0 ?Debridement Performed for ?Wound #4 Right,Medial Lower Leg ?Assessment: ?Performed By: Physician Ricard Dillon, MD ?Debridement Type: Debridement ?Level of Consciousness (Pre- ?Awake and Alert ?procedure): ?Pre-procedure Verification/Time Out ?Yes - 11:00 ?Taken: ?Start Time: 11:00 ?Pain Control: Lidocaine 4% Topical Solution ?Total Area Debrided (L x W): 3 (cm) x 3 (cm) = 9 (cm?) ?Tissue and other material ?Viable, Non-Viable, Slough, Subcutaneous, Biofilm, Slough ?debrided: ?Level: Skin/Subcutaneous Tissue ?Debridement Description: Excisional ?Instrument: Curette ?Bleeding: Moderate ?Hemostasis Achieved: Pressure ?End Time:  11:08 ?Procedural Pain: 0 ?Post Procedural Pain: 0 ?Response to Treatment: Procedure was tolerated well ?Level of Consciousness (Post- ?Awake and Alert ?procedure): ?Post Debridement Measurements of Total Wound ?Length: (cm) 3.6 ?Width: (cm) 3 ?Depth: (cm) 0.2 ?Volume: (cm?) 1.696 ?Character of Wound/Ulcer Post Debridement: Improved ?Post Procedure Diagnosis ?Same as Pre-procedure ?Electronic Signature(s) ?Signed: 11/02/2021 7:47:50 AM By: Linton Ham MD ?Signed: 11/07/2021 9:16:22 AM By: Carlene Coria RN ?Entered By: Carlene Coria on 11/01/2021 11:05:16 ?ANGELE, WIEMANN ([id number]) ?-------------------------------------------------------------------------------- ?Debridement Details ?Patient Name: Heather, Clark ?Date of Service: 11/01/2021 10:15 AM ?Medical Record Number: [id number] ?Patient Account Number: [id number] ?Date of Birth/Sex: 02-25-1946 (76 y.o. F) ?Treating RN: Carlene Coria ?Primary Care Provider: Harold Barban Other Clinician: ?Referring Provider: Referral, Self ?Treating Provider/Extender: Ricard Dillon ?Weeks in Treatment: 0 ?Debridement Performed for ?Wound #3 Right Lower Leg ?Assessment: ?Performed By: Physician Ricard Dillon, MD ?Debridement Type: Debridement ?Level of Consciousness (Pre- ?Awake and Alert ?procedure): ?Pre-procedure Verification/Time Out ?Yes - 11:00 ?Taken: ?Start Time: 11:00 ?Pain Control: Lidocaine 4% Topical Solution ?Total Area Debrided (L x W): 1 (cm) x 1.1 (cm) = 1.1 (cm?) ?Tissue and other material ?Viable, Non-Viable, Slough, Subcutaneous, Biofilm, Slough ?debrided: ?Level: Skin/Subcutaneous Tissue ?Debridement Description: Excisional ?Instrument: Curette ?Bleeding: Moderate ?Hemostasis Achieved: Pressure ?End Time: 11:08 ?Procedural Pain: 0 ?Post Procedural Pain: 0 ?Response to Treatment: Procedure was tolerated well ?Level of Consciousness (Post- ?Awake and Alert ?procedure): ?Post Debridement Measurements of Total Wound ?Length: (cm) 1 ?Width: (cm)  1.1 ?Depth: (cm) 0.2 ?Volume: (cm?) 0.173 ?Character of Wound/Ulcer Post Debridement: Improved ?Post Procedure Diagnosis ?Same as Pre-procedure ?Electronic Signature(s) ?Signed: 11/02/2021 7:47:50 AM By: Linton Ham MD ?Signed: 11/07/2021 9:16:22 AM By: Carlene Coria RN ?Entered By: Carlene Coria on 11/01/2021 11:06:11 ?LAURIELLE, SELMON ([id number]) ?-------------------------------------------------------------------------------- ?HPI Details ?Patient Name: Heather, Clark ?Date of Service: 11/01/2021 10:15 AM ?Medical Record Number: [id number] ?Patient Account Number: [id number] ?Date of Birth/Sex: Sep 15, 1945 (76 y.o. F) ?Treating RN: Carlene Coria ?Primary Care Provider: Harold Barban Other Clinician: ?Referring Provider: Referral, Self ?Treating Provider/Extender: Ricard Dillon ?  Weeks in Treatment: 0 ?History of Present Illness ?HPI Description: ADMISSION ?11/02/2020 ?This is a 76 year old woman who spent 3 months in [place] just returning yesterday. On March 7 she was picking oranges for the need he had a ?fall and developed a skin tear on her left lower leg she is also able to show me pictures. She was seen in ER or urgent care. They tried to replace ?the skin with Steri-Strips. She suddenly developed swelling in the area. She went back to the urgent care. She has been using Bactroban and ?Steri-Strips to try and reapplied approximate things ever since. She does have atrial fibrillation on anticoagulants. ?Past medical history includes paroxysmal atrial fibrillation, multiple skin problems followed by Dr. 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 She also has a history of squamous cell carcinoma ?of the skin and has been treated with radiation to her legs at one point as well as I think phototherapy. She also has hyperlipidemia. ?ABI in our clinic was 1.19 on the left ?11/08/2020 upon evaluation today patient  appears to be doing well with regard to her wound on her leg. This is showing signs of good ?improvement which is great news and overall there does not appear to be any signs of active infection at this time which is great news. Obviously ?this is dramatically improved even compared to last week when Dr. Dellia Nims saw her. ?4/27; left anterior leg wound trauma in the setting of chronic venous insufficiency and likely solar skin damage. In general this is better than last ?time I saw this. Healthy looking granulation. There is a small tunnel from about 11-12 o'clock at 0.4 cm. Using silver algia ?5/4; left anterior lower leg wound looks a lot better. This is gradually closing over. There is some eschar and flaking skin around the edges of this ?wound. She shows me a small raised area in the anterior midline more towards the ankle. She also tells me she has a history of squamous cell ?skin cancer. ?5/11; her left anterior lower leg wound which was traumatic in the setting of chronic venous insufficiency actually looks a lot better it is just about ?closed ?The last time I saw this she had what looked to be a squamous cell carcinoma on the left lower leg also anteriorly. I sent her back to dermatology ?they remove this it was apparently squamous cell carcinoma as she has had ofttimes before. This is now opened into a wound as well deeper but ?with a clean wound surface ?5/18; her traumatic wound on the left upper tibia is closed. The area that there is a squamous cell carcinoma that was removed by dermatology is ?also almost closed over. She has a follow-up with them sometime late next week. I am hopeful we will be able to heal her out from the wound ?point of view before then. ?5/25; her traumatic wound on the upper tibia is closed. The squamous cell surgery site had slight amount of eschar over this. She is complaining ?of irritation of her skin probably a result of the compression. She also has a small area just under  her original traumatic wound that popped up ?under compression this week ?READMISSION ?11/01/2021 ?This is a now [age]-year-old woman that we had in the clinic last year at that time she had a wound on her left lower leg w

## 2021-11-15 ENCOUNTER — Ambulatory Visit: Payer: PPO | Admitting: Dermatology

## 2021-11-15 ENCOUNTER — Encounter (HOSPITAL_BASED_OUTPATIENT_CLINIC_OR_DEPARTMENT_OTHER): Payer: PPO | Admitting: Internal Medicine

## 2021-11-15 DIAGNOSIS — L570 Actinic keratosis: Secondary | ICD-10-CM

## 2021-11-15 DIAGNOSIS — C44722 Squamous cell carcinoma of skin of right lower limb, including hip: Secondary | ICD-10-CM

## 2021-11-15 DIAGNOSIS — L821 Other seborrheic keratosis: Secondary | ICD-10-CM | POA: Diagnosis not present

## 2021-11-15 DIAGNOSIS — T8131XD Disruption of external operation (surgical) wound, not elsewhere classified, subsequent encounter: Secondary | ICD-10-CM | POA: Diagnosis not present

## 2021-11-15 DIAGNOSIS — L820 Inflamed seborrheic keratosis: Secondary | ICD-10-CM

## 2021-11-15 DIAGNOSIS — L814 Other melanin hyperpigmentation: Secondary | ICD-10-CM | POA: Diagnosis not present

## 2021-11-15 DIAGNOSIS — L97818 Non-pressure chronic ulcer of other part of right lower leg with other specified severity: Secondary | ICD-10-CM

## 2021-11-15 DIAGNOSIS — L578 Other skin changes due to chronic exposure to nonionizing radiation: Secondary | ICD-10-CM

## 2021-11-15 DIAGNOSIS — D485 Neoplasm of uncertain behavior of skin: Secondary | ICD-10-CM

## 2021-11-15 MED ORDER — FLUOROURACIL 5 % EX CREA: TOPICAL_CREAM | CUTANEOUS | 0 refills | Status: AC

## 2021-11-15 NOTE — Progress Notes (Signed)
? ?Follow-Up Visit ?  ?Subjective  ?[name] is a 76 y.o. female who presents for the following: Actinic Keratosis (Of the legs - recheck new or persistent precancerous skin lesions. ). The patient has spots, moles and lesions to be evaluated, some may be new or changing and the patient has concerns that these could be cancer. ? ?The following portions of the chart were reviewed this encounter and updated as appropriate:  ? Tobacco  Allergies  Meds  Problems  Med Hx  Surg Hx  Fam Hx   ?  ?Review of Systems:  No other skin or systemic complaints except as noted in HPI or Assessment and Plan. ? ?Objective  ?Well appearing patient in no apparent distress; mood and affect are within normal limits. ? ?A focused examination was performed including B/L leg . Relevant physical exam findings are noted in the Assessment and Plan. ? ?L knee x 1, L ant shoulder x 1 ?Erythematous stuck-on, waxy papule or plaque ? ?Right Medial Thigh ?Hyperkeratotic papule 1.5 cm ? ? ?Assessment & Plan  ?Actinic skin damage ? ?Related Medications ?fluorouracil (EFUDEX) 5 % cream ?Apply to the left lower leg BID x 7 days. ? ?Inflamed seborrheic keratosis ?L knee x 1, L ant shoulder x 1 ? ?Destruction of lesion - L knee x 1, L ant shoulder x 1 ?Complexity: simple   ?Destruction method: cryotherapy   ?Informed consent: discussed and consent obtained   ?Timeout:  patient name, date of birth, surgical site, and procedure verified ?Lesion destroyed using liquid nitrogen: Yes   ?Region frozen until ice ball extended beyond lesion: Yes   ?Outcome: patient tolerated procedure well with no complications   ?Post-procedure details: wound care instructions given   ? ?Neoplasm of uncertain behavior of skin ?Right Medial Thigh ? ?Epidermal / dermal shaving ? ?Lesion diameter (cm):  1.5 ?Informed consent: discussed and consent obtained   ?Timeout: patient name, date of birth, surgical site, and procedure verified   ?Procedure prep:  Patient was  prepped and draped in usual sterile fashion ?Prep type:  Isopropyl alcohol ?Anesthesia: the lesion was anesthetized in a standard fashion   ?Anesthetic:  1% lidocaine w/ epinephrine 1-100,000 buffered w/ 8.4% NaHCO3 ?Instrument used: flexible razor blade   ?Hemostasis achieved with: pressure, aluminum chloride and electrodesiccation   ?Outcome: patient tolerated procedure well   ?Post-procedure details: sterile dressing applied and wound care instructions given   ?Dressing type: bandage and petrolatum   ? ?Destruction of lesion ?Complexity: extensive   ?Destruction method: electrodesiccation and curettage   ?Informed consent: discussed and consent obtained   ?Timeout:  patient name, date of birth, surgical site, and procedure verified ?Procedure prep:  Patient was prepped and draped in usual sterile fashion ?Prep type:  Isopropyl alcohol ?Anesthesia: the lesion was anesthetized in a standard fashion   ?Anesthetic:  1% lidocaine w/ epinephrine 1-100,000 buffered w/ 8.4% NaHCO3 ?Curettage performed in three different directions: Yes   ?Electrodesiccation performed over the curetted area: Yes   ?Lesion length (cm):  1.5 ?Lesion width (cm):  1.5 ?Margin per side (cm):  0.2 ?Final wound size (cm):  1.9 ?Hemostasis achieved with:  pressure, aluminum chloride and electrodesiccation ?Outcome: patient tolerated procedure well with no complications   ?Post-procedure details: sterile dressing applied and wound care instructions given   ?Dressing type: bandage and petrolatum   ? ?Specimen 1 - Surgical pathology ?Differential Diagnosis: D48.5 r/o SCC ?ED&C today ?Check Margins: No ? ?Actinic Damage - Severe, confluent actinic changes with  pre-cancerous actinic keratoses  ?- Severe, chronic, not at goal, secondary to cumulative UV radiation exposure over time ?- diffuse scaly erythematous macules and papules with underlying dyspigmentation ?- Discussed Prescription "Field Treatment" for Severe, Chronic Confluent Actinic Changes  with Pre-Cancerous Actinic Keratoses ?Field treatment involves treatment of an entire area of skin that has confluent Actinic Changes (Sun/ Ultraviolet light damage) and PreCancerous Actinic Keratoses by method of PhotoDynamic Therapy (PDT) and/or prescription Topical Chemotherapy agents such as 5-fluorouracil, 5-fluorouracil/calcipotriene, and/or imiquimod.  The purpose is to decrease the number of clinically evident and subclinical PreCancerous lesions to prevent progression to development of skin cancer by chemically destroying early precancer changes that may or may not be visible.  It has been shown to reduce the risk of developing skin cancer in the treated area. As a result of treatment, redness, scaling, crusting, and open sores may occur during treatment course. One or more than one of these methods may be used and may have to be used several times to control, suppress and eliminate the PreCancerous changes. Discussed treatment course, expected reaction, and possible side effects. ?- Recommend daily broad spectrum sunscreen SPF 30+ to sun-exposed areas, reapply every 2 hours as needed.  ?- Staying in the shade or wearing long sleeves, sun glasses (UVA+UVB protection) and wide brim hats (4-inch brim around the entire circumference of the hat) are also recommended. ?- Call for new or changing lesions. ?- Start 5FU/Calcipotriene on the L lower leg BID x 1 week.  ? ?Seborrheic Keratoses ?- Stuck-on, waxy, tan-brown papules and/or plaques  ?- Benign-appearing ?- Discussed benign etiology and prognosis. ?- Observe ?- Call for any changes ? ?Lentigines ?- Scattered tan macules ?- Due to sun exposure ?- Benign-appering, observe ?- Recommend daily broad spectrum sunscreen SPF 30+ to sun-exposed areas, reapply every 2 hours as needed. ?- Call for any changes ? ?Return in about 4 months (around 03/17/2022) for [place] follow up . ? ?IRudell Cobb, CMA, am acting as scribe for Sarina Ser, MD . ?Documentation: I have  reviewed the above documentation for accuracy and completeness, and I agree with the above. ? ?Sarina Ser, MD ? ?

## 2021-11-15 NOTE — Patient Instructions (Addendum)
Start 5-fluorouracil/calcipotriene cream twice a day for 7 days to affected areas including the left lower leg. Prescription sent to [facility]. Patient provided with contact information for pharmacy and advised the pharmacy will mail the prescription to their home. Patient provided with handout reviewing treatment course and side effects and advised to call or message [place] on MyChart with any concerns. ? ? ?5-fluorouracil/calcipotriene cream is is a type of field treatment used to treat precancers, thin skin cancers, and areas of sun damage. Expected reaction includes irritation and mild inflammation potentially progressing to more severe inflammation including redness, scaling, crusting and open sores/erosions.  If too much irritation occurs, ensure application of only a thin layer and decrease frequency of use to achieve a tolerable level of inflammation. Recommend applying Vaseline ointment to open sores as needed.  Minimize sun exposure while under treatment. Recommend daily broad spectrum sunscreen SPF 30+ to sun-exposed areas, reapply every 2 hours as needed.  ? ?  ?5-Fluorouracil/Calcipotriene Patient Education  ? ?Actinic keratoses are the dry, red scaly spots on the skin caused by sun damage. A portion of these spots can turn into skin cancer with time, and treating them can help prevent development of skin cancer.  ? ?Treatment of these spots requires removal of the defective skin cells. There are various ways to remove actinic keratoses, including freezing with liquid nitrogen, treatment with creams, or treatment with a blue light procedure in the office.  ? ?5-fluorouracil cream is a topical cream used to treat actinic keratoses. It works by interfering with the growth of abnormal fast-growing skin cells, such as actinic keratoses. These cells peel off and are replaced by healthy ones.  ? ?5-fluorouracil/calcipotriene is a combination of the 5-fluorouracil cream with a vitamin D analog cream called  calcipotriene. The calcipotriene alone does not treat actinic keratoses. However, when it is combined with 5-fluorouracil, it helps the 5-fluorouracil treat the actinic keratoses much faster so that the same results can be achieved with a much shorter treatment time. ? ?INSTRUCTIONS FOR 5-FLUOROURACIL/CALCIPOTRIENE CREAM:  ? ?5-fluorouracil/calcipotriene cream typically only needs to be used for 4-7 days. A thin layer should be applied twice a day to the treatment areas recommended by your physician.  ? ?If your physician prescribed you separate tubes of 5-fluourouracil and calcipotriene, apply a thin layer of 5-fluorouracil followed by a thin layer of calcipotriene.  ? ?Avoid contact with your eyes, nostrils, and mouth. Do not use 5-fluorouracil/calcipotriene cream on infected or open wounds.  ? ?You will develop redness, irritation and some crusting at areas where you have pre-cancer damage/actinic keratoses. IF YOU DEVELOP PAIN, BLEEDING, OR SIGNIFICANT CRUSTING, STOP THE TREATMENT EARLY - you have already gotten a good response and the actinic keratoses should clear up well. ? ?Wash your hands after applying 5-fluorouracil 5% cream on your skin.  ? ?A moisturizer or sunscreen with a minimum SPF 30 should be applied each morning.  ? ?Once you have finished the treatment, you can apply a thin layer of Vaseline twice a day to irritated areas to soothe and calm the areas more quickly. If you experience significant discomfort, contact your physician. ? ?For some patients it is necessary to repeat the treatment for best results. ? ?SIDE EFFECTS: When using 5-fluorouracil/calcipotriene cream, you may have mild irritation, such as redness, dryness, swelling, or a mild burning sensation. This usually resolves within 2 weeks. The more actinic keratoses you have, the more redness and inflammation you can expect during treatment. Eye irritation has been reported  rarely. If this occurs, please let us know.  ? ?If you have  any trouble using this cream, please call the office. If you have any other questions about this information, please do not hesitate to ask me before you leave the office. ? ?If You Need Anything After Your Visit ? ?If you have any questions or concerns for your doctor, please call our main line at [phone] and press option 4 to reach your doctor's medical assistant. If no one answers, please leave a voicemail as directed and we will return your call as soon as possible. Messages left after 4 pm will be answered the following business day.  ? ?You may also send [place] a message via MyChart. We typically respond to MyChart messages within 1-2 business days. ? ?For prescription refills, please ask your pharmacy to contact our office. Our fax number is [phone]. ? ?If you have an urgent issue when the clinic is closed that cannot wait until the next business day, you can page your doctor at the number below.   ? ?Please note that while we do our best to be available for urgent issues outside of office hours, we are not available 24/7.  ? ?If you have an urgent issue and are unable to reach [place], you may choose to seek medical care at your doctor's office, retail clinic, urgent care center, or emergency room. ? ?If you have a medical emergency, please immediately call 911 or go to the emergency department. ? ?Pager Numbers ? ?- Dr. Nehemiah Massed: [phone] ? ?- Dr. Laurence Ferrari: [phone] ? ?- Dr. Nicole Kindred: [phone] ? ?In the event of inclement weather, please call our main line at [phone] for an update on the status of any delays or closures. ? ?Dermatology Medication Tips: ?Please keep the boxes that topical medications come in in order to help keep track of the instructions about where and how to use these. Pharmacies typically print the medication instructions only on the boxes and not directly on the medication tubes.  ? ?If your medication is too expensive, please contact our office at [phone] option 4 or  send [place] a message through [place].  ? ?We are unable to tell what your co-pay for medications will be in advance as this is different depending on your insurance coverage. However, we may be able to find a substitute medication at lower cost or fill out paperwork to get insurance to cover a needed medication.  ? ?If a prior authorization is required to get your medication covered by your insurance company, please allow [place] 1-2 business days to complete this process. ? ?Drug prices often vary depending on where the prescription is filled and some pharmacies may offer cheaper prices. ? ?The website www.goodrx.com contains coupons for medications through different pharmacies. The prices here do not account for what the cost may be with help from insurance (it may be cheaper with your insurance), but the website can give you the price if you did not use any insurance.  ?- You can print the associated coupon and take it with your prescription to the pharmacy.  ?- You may also stop by our office during regular business hours and pick up a GoodRx coupon card.  ?- If you need your prescription sent electronically to a different pharmacy, notify our office through [facility] or by phone at [phone] option 4. ? ? ? ? ?Si Usted Necesita Algo Despu?s de Su Visita ? ?Tambi?n puede enviarnos un mensaje a trav?s de MyChart. Por  lo general respondemos a los mensajes de MyChart en el transcurso de 1 a 2 d?as h?biles. ? ?Para renovar recetas, por favor pida a su farmacia que se ponga en contacto con nuestra oficina. Nuestro n?mero de fax es el [phone]. ? ?Si tiene un asunto urgente cuando la cl?nica est? cerrada y que no puede esperar hasta el siguiente d?a h?bil, puede llamar/localizar a su doctor(a) al n?mero que aparece a continuaci?n.  ? ?Por favor, tenga en cuenta que aunque hacemos todo lo posible para estar disponibles para asuntos urgentes fuera del horario de oficina, no estamos disponibles las 24 horas del  d?a, los 7 d?as de la semana.  ? ?Si tiene un problema urgente y no puede comunicarse con nosotros, puede optar por buscar atenci?n m?dica  en el consultorio de su doctor(a), en una cl?nica privada, en

## 2021-11-17 ENCOUNTER — Other Ambulatory Visit: Payer: Self-pay | Admitting: Cardiovascular Disease

## 2021-11-17 NOTE — Progress Notes (Signed)
TANGALA, WIEGERT ([id number]) ?Visit Report for 11/15/2021 ?Chief Complaint Document Details ?Patient Name: Heather, Clark ?Date of Service: 11/15/2021 2:00 PM ?Medical Record Number: [id number] ?Patient Account Number: [id number] ?Date of Birth/Sex: 02-Apr-1946 (76 y.o. F) ?Treating RN: Carlene Coria ?Primary Care Provider: Harold Barban Other Clinician: ?Referring Provider: Harold Barban ?Treating Provider/Extender: Kalman Shan ?Weeks in Treatment: 2 ?Information Obtained from: Patient ?Chief Complaint ?11/01/2021; patient returns to clinic with 2 wounds on the right anterior lower leg. These are secondary to surgical extractions of squamous cell ?carcinomas ?Electronic Signature(s) ?Signed: 11/15/2021 5:26:07 PM By: Kalman Shan DO ?Entered By: Kalman Shan on 11/15/2021 15:08:31 ?Heather, Clark ([id number]) ?-------------------------------------------------------------------------------- ?Debridement Details ?Patient Name: Heather, Clark ?Date of Service: 11/15/2021 2:00 PM ?Medical Record Number: [id number] ?Patient Account Number: [id number] ?Date of Birth/Sex: 01-08-1946 (76 y.o. F) ?Treating RN: Carlene Coria ?Primary Care Provider: Harold Barban Other Clinician: ?Referring Provider: Harold Barban ?Treating Provider/Extender: Kalman Shan ?Weeks in Treatment: 2 ?Debridement Performed for ?Wound #4 Right,Medial Lower Leg ?Assessment: ?Performed By: Physician Kalman Shan, MD ?Debridement Type: Debridement ?Level of Consciousness (Pre- ?Awake and Alert ?procedure): ?Pre-procedure Verification/Time Out ?Yes - 14:37 ?Taken: ?Start Time: 14:37 ?Pain Control: Lidocaine 4% Topical Solution ?Total Area Debrided (L x W): 3 (cm) x 2.5 (cm) = 7.5 (cm?) ?Tissue and other material ?Viable, Non-Viable, Slough, Subcutaneous, Skin: Dermis , Skin: Epidermis, Slough ?debrided: ?Level: Skin/Subcutaneous Tissue ?Debridement Description: Excisional ?Instrument: Curette ?Bleeding: Moderate ?Hemostasis  Achieved: Pressure ?End Time: 14:52 ?Procedural Pain: 0 ?Post Procedural Pain: 0 ?Response to Treatment: Procedure was tolerated well ?Level of Consciousness (Post- ?Awake and Alert ?procedure): ?Post Debridement Measurements of Total Wound ?Length: (cm) 3 ?Width: (cm) 2.5 ?Depth: (cm) 0.1 ?Volume: (cm?) 0.589 ?Character of Wound/Ulcer Post Debridement: Improved ?Post Procedure Diagnosis ?Same as Pre-procedure ?Electronic Signature(s) ?Signed: 11/15/2021 5:26:07 PM By: Kalman Shan DO ?Signed: 11/17/2021 1:52:28 PM By: Carlene Coria RN ?Entered By: Carlene Coria on 11/15/2021 14:40:16 ?Heather, Clark ([id number]) ?-------------------------------------------------------------------------------- ?HPI Details ?Patient Name: Heather, Clark ?Date of Service: 11/15/2021 2:00 PM ?Medical Record Number: [id number] ?Patient Account Number: [id number] ?Date of Birth/Sex: 23-Dec-1945 (76 y.o. F) ?Treating RN: Carlene Coria ?Primary Care Provider: Harold Barban Other Clinician: ?Referring Provider: Harold Barban ?Treating Provider/Extender: Kalman Shan ?Weeks in Treatment: 2 ?History of Present Illness ?HPI Description: ADMISSION ?11/02/2020 ?This is a 76 year old woman who spent 3 months in [place] just returning yesterday. On March 7 she was picking oranges for the need he had a ?fall and developed a skin tear on her left lower leg she is also able to show me pictures. She was seen in ER or urgent care. They tried to replace ?the skin with Steri-Strips. She suddenly developed swelling in the area. She went back to the urgent care. She has been using Bactroban and ?Steri-Strips to try and reapplied approximate things ever since. She does have atrial fibrillation on anticoagulants. ?Past medical history includes paroxysmal atrial fibrillation, multiple skin problems followed by Dr. Nehemiah Massed at [place] [facility] including seborrheic ?keratosis melanotic nevi hemangiomas and actinic damage secondary to chronic UV exposure.  She also has a history of squamous cell carcinoma ?of the skin and has been treated with radiation to her legs at one point as well as I think phototherapy. She also has hyperlipidemia. ?ABI in our clinic was 1.19 on the left ?11/08/2020 upon evaluation today patient appears to be doing well with regard to her wound on her leg. This is showing signs of good ?improvement which is great news and overall there does not  appear to be any signs of active infection at this time which is great news. Obviously ?this is dramatically improved even compared to last week when Dr. Dellia Nims saw her. ?4/27; left anterior leg wound trauma in the setting of chronic venous insufficiency and likely solar skin damage. In general this is better than last ?time I saw this. Healthy looking granulation. There is a small tunnel from about 11-12 o'clock at 0.4 cm. Using silver algia ?5/4; left anterior lower leg wound looks a lot better. This is gradually closing over. There is some eschar and flaking skin around the edges of this ?wound. She shows me a small raised area in the anterior midline more towards the ankle. She also tells me she has a history of squamous cell ?skin cancer. ?5/11; her left anterior lower leg wound which was traumatic in the setting of chronic venous insufficiency actually looks a lot better it is just about ?closed ?The last time I saw this she had what looked to be a squamous cell carcinoma on the left lower leg also anteriorly. I sent her back to dermatology ?they remove this it was apparently squamous cell carcinoma as she has had ofttimes before. This is now opened into a wound as well deeper but ?with a clean wound surface ?5/18; her traumatic wound on the left upper tibia is closed. The area that there is a squamous cell carcinoma that was removed by dermatology is ?also almost closed over. She has a follow-up with them sometime late next week. I am hopeful we will be able to heal her out from the wound ?point of  view before then. ?5/25; her traumatic wound on the upper tibia is closed. The squamous cell surgery site had slight amount of eschar over this. She is complaining ?of irritation of her skin probably a result of the compression. She also has a small area just under her original traumatic wound that popped up ?under compression this week ?READMISSION ?11/01/2021 ?This is a now [age]-year-old woman that we had in the clinic last year at that time she had a wound on her left lower leg which was an abrasion ?from a fall and also a secondary wound from a surgical extraction of a squamous cell carcinoma. These areas healed. She tells me that currently ?she had 2 areas removed by Dr. Nehemiah Massed dermatology at [facility] in December. He felt he fully extracted these. As per her usual she goes ?to [place] for the winter. Unfortunately the larger area which is more medial developed obvious recurrences these were extracted by dermatology ?in [place]. She then underwent Mohs surgery and because of recurrent positive margin she had 2 further Mohs surgeries. There was no repair ?with the wounds set to close by secondary intention. Before she left [place] at the end of March she was prescribed cephalexin but she did not ?take this because she did not think they were infected [history of C. difficile]. She has been using some form of leftover dressings she had from ?last yearo [place] ?She returns to the [place] area with 2 wounds to the larger 1 a bit superior and medial and the smaller one more central. Both of these have ?nonviable surfaces. She has chronic venous insufficiency and solar induced skin damage ?No change in her past medical history. Her ABI was normal on the right ?11/15/2021; patient presents for follow-up. She has been using Hydrofera Blue every couple days. She has no issues or complaints today. She ?denies signs of infection. ?Electronic Signature(s) ?  Signed: 11/15/2021 5:26:07 PM By: Kalman Shan  DO ?Entered By: Kalman Shan on 11/15/2021 15:09:05 ?Heather, Clark ([id number]) ?-------------------------------------------------------------------------------- ?Physical Exam Details ?Patient Name: HILGERT,

## 2021-11-17 NOTE — Progress Notes (Signed)
[name], [name] ([id number]) ?Visit Report for 11/15/2021 ?Arrival Information Details ?Patient Name: [name], [name] ?Date of Service: 11/15/2021 2:00 PM ?Medical Record Number: [id number] ?Patient Account Number: [id number] ?Date of Birth/Sex: 1945-11-24 (76 y.o. F) ?Treating RN: Carlene Coria ?Primary Care : Harold Barban Other Clinician: ?Referring : Harold Barban ?Treating /Extender: Kalman Shan ?Weeks in Treatment: 2 ?Visit Information History Since Last Visit ?All ordered tests and consults were completed: No ?Patient Arrived: Ambulatory ?Added or deleted any medications: No ?Arrival Time: 14:09 ?Any new allergies or adverse reactions: No ?Accompanied By: husband ?Had a fall or experienced change in No ?Transfer Assistance: None ?activities of daily living that may affect ?Patient Identification Verified: Yes ?risk of falls: ?Secondary Verification Process Completed: Yes ?Signs or symptoms of abuse/neglect since last visito No ?Patient Requires Transmission-Based Precautions: No ?Hospitalized since last visit: No ?Patient Has Alerts: No ?Implantable device outside of the clinic excluding No ?cellular tissue based products placed in the center ?since last visit: ?Has Dressing in Place as Prescribed: Yes ?Has Compression in Place as Prescribed: Yes ?Pain Present Now: No ?Electronic Signature(s) ?Signed: 11/17/2021 1:52:28 PM By: Carlene Coria RN ?Entered By: Carlene Coria on 11/15/2021 14:12:58 ?[name], [name] ([id number]) ?-------------------------------------------------------------------------------- ?Clinic Level of Care Assessment Details ?Patient Name: [name], [name] ?Date of Service: 11/15/2021 2:00 PM ?Medical Record Number: [id number] ?Patient Account Number: [id number] ?Date of Birth/Sex: 06-Dec-1945 (76 y.o. F) ?Treating RN: Carlene Coria ?Primary Care : Harold Barban Other Clinician: ?Referring : Harold Barban ?Treating /Extender: Kalman Shan ?Weeks in Treatment: 2 ?Clinic Level of Care Assessment Items ?TOOL 1 Quantity Score ?'[]'$  - Use when EandM and Procedure is performed on INITIAL visit 0 ?ASSESSMENTS - Nursing Assessment / Reassessment ?'[]'$  - General Physical Exam (combine w/ comprehensive assessment (listed just below) when performed on new ?0 ?pt. evals) ?'[]'$  - 0 ?Comprehensive Assessment (HX, ROS, Risk Assessments, Wounds Hx, etc.) ?ASSESSMENTS - Wound and Skin Assessment / Reassessment ?'[]'$  - Dermatologic / Skin Assessment (not related to wound area) 0 ?ASSESSMENTS - Ostomy and/or Continence Assessment and Care ?'[]'$  - Incontinence Assessment and Management 0 ?'[]'$  - 0 ?Ostomy Care Assessment and Management (repouching, etc.) ?PROCESS - Coordination of Care ?'[]'$  - Simple Patient / Family Education for ongoing care 0 ?'[]'$  - 0 ?Complex (extensive) Patient / Family Education for ongoing care ?'[]'$  - 0 ?Staff obtains Consents, Records, Test Results / Process Orders ?'[]'$  - 0 ?Staff telephones HHA, Nursing Homes / Clarify orders / etc ?'[]'$  - 0 ?Routine Transfer to another Facility (non-emergent condition) ?'[]'$  - 0 ?Routine Hospital Admission (non-emergent condition) ?'[]'$  - 0 ?New Admissions / Biomedical engineer / Ordering NPWT, Apligraf, etc. ?'[]'$  - 0 ?Emergency Hospital Admission (emergent condition) ?PROCESS - Special Needs ?'[]'$  - Pediatric / Minor Patient Management 0 ?'[]'$  - 0 ?Isolation Patient Management ?'[]'$  - 0 ?Hearing / Language / Visual special needs ?'[]'$  - 0 ?Assessment of Community assistance (transportation, D/C planning, etc.) ?'[]'$  - 0 ?Additional assistance / Altered mentation ?'[]'$  - 0 ?Support Surface(s) Assessment (bed, cushion, seat, etc.) ?INTERVENTIONS - Miscellaneous ?'[]'$  - External ear exam 0 ?'[]'$  - 0 ?Patient Transfer (multiple staff / Civil Service fast streamer / Similar devices) ?'[]'$  - 0 ?Simple Staple / Suture removal (25 or less) ?'[]'$  - 0 ?Complex Staple / Suture removal (26 or more) ?'[]'$  - 0 ?Hypo/Hyperglycemic Management (do not check if billed  separately) ?'[]'$  - 0 ?Ankle / Brachial Index (ABI) - do not check if billed separately ?Has the patient been seen at the hospital within the last  three years: Yes ?Total Score: 0 ?Level Of Care: ____ ?[name], [name] ([id number]) ?Electronic Signature(s) ?Signed: 11/17/2021 1:52:28 PM By: Carlene Coria RN ?Entered By: Carlene Coria on 11/15/2021 14:41:28 ?[name], [name] ([id number]) ?-------------------------------------------------------------------------------- ?Encounter Discharge Information Details ?Patient Name: [name], [name] ?Date of Service: 11/15/2021 2:00 PM ?Medical Record Number: [id number] ?Patient Account Number: [id number] ?Date of Birth/Sex: 03-02-1946 (76 y.o. F) ?Treating RN: Carlene Coria ?Primary Care : Harold Barban Other Clinician: ?Referring : Harold Barban ?Treating /Extender: Kalman Shan ?Weeks in Treatment: 2 ?Encounter Discharge Information Items Post Procedure Vitals ?Discharge Condition: Stable ?Temperature (?F): 98.2 ?Ambulatory Status: Ambulatory ?Pulse (bpm): 73 ?Discharge Destination: Home ?Respiratory Rate (breaths/min): 18 ?Transportation: Private Auto ?Blood Pressure (mmHg): 113/68 ?Accompanied By: self ?Schedule Follow-up Appointment: Yes ?Clinical Summary of Care: Patient Declined ?Electronic Signature(s) ?Signed: 11/17/2021 1:52:28 PM By: Carlene Coria RN ?Entered By: Carlene Coria on 11/15/2021 14:43:44 ?[name], [name] ([id number]) ?-------------------------------------------------------------------------------- ?Lower Extremity Assessment Details ?Patient Name: [name], [name] ?Date of Service: 11/15/2021 2:00 PM ?Medical Record Number: [id number] ?Patient Account Number: [id number] ?Date of Birth/Sex: [birth year] (76 y.o. F) ?Treating RN: Carlene Coria ?Primary Care : Harold Barban Other Clinician: ?Referring : Harold Barban ?Treating /Extender: Kalman Shan ?Weeks in Treatment: 2 ?Edema Assessment ?Assessed: [Left: No]  [Right: No] ?Edema: [Left: Ye] [Right: s] ?Calf ?Left: Right: ?Point of Measurement: 35 cm From Medial Instep 34 cm ?Ankle ?Left: Right: ?Point of Measurement: 10 cm From Medial Instep 21 cm ?Knee To Floor ?Left: Right: ?From Medial Instep 44 cm ?Vascular Assessment ?Pulses: ?Dorsalis Pedis ?Palpable: [Right:Yes] ?Electronic Signature(s) ?Signed: 11/17/2021 1:52:28 PM By: Carlene Coria RN ?Entered By: Carlene Coria on 11/15/2021 14:19:25 ?[name], [name] ([id number]) ?-------------------------------------------------------------------------------- ?Multi Wound Chart Details ?Patient Name: [name], [name] ?Date of Service: 11/15/2021 2:00 PM ?Medical Record Number: [id number] ?Patient Account Number: [id number] ?Date of Birth/Sex: Dec 18, 1945 (76 y.o. F) ?Treating RN: Carlene Coria ?Primary Care : Harold Barban Other Clinician: ?Referring : Harold Barban ?Treating /Extender: Kalman Shan ?Weeks in Treatment: 2 ?Vital Signs ?Height(in): 67 ?Pulse(bpm): 73 ?Weight(lbs): 151 ?Blood Pressure(mmHg): 113/68 ?Body Mass Index(BMI): 23.6 ?Temperature(??F): 98.2 ?Respiratory Rate(breaths/min): 18 ?Photos: [N/A:N/A] ?Wound Location: Right Lower Leg Right, Medial Lower Leg N/A ?Wounding Event: Surgical Injury Gradually Appeared N/A ?Primary Etiology: Open Surgical Wound Open Surgical Wound N/A ?Comorbid History: Cataracts, Arrhythmia, Cataracts, Arrhythmia, N/A ?Hypertension, Colitis, Received Hypertension, Colitis, Received ?Chemotherapy Chemotherapy ?Date Acquired: 06/22/2021 06/22/2021 N/A ?Weeks of Treatment: 2 2 N/A ?Wound Status: Open Open N/A ?Wound Recurrence: No No N/A ?Measurements L x W x D (cm) 0.3x0.3x0.1 3x2.5x0.1 N/A ?Area (cm?) : 0.071 5.89 N/A ?Volume (cm?) : 0.007 0.589 N/A ?% Reduction in Area: 91.80% 30.60% N/A ?% Reduction in Volume: 96.00% 65.30% N/A ?Classification: Full Thickness Without Exposed Full Thickness Without Exposed N/A ?Support Structures Support Structures ?Exudate  Amount: Medium Medium N/A ?Exudate Type: Serosanguineous Serosanguineous N/A ?Exudate Color: red, brown red, brown N/A ?Granulation Amount: Medium (34-66%) Medium (34-66%) N/A ?Granulation Quality: Red, Pink Red, Pin

## 2021-11-17 NOTE — Telephone Encounter (Signed)
Prescription refill request for Xarelto received.  ?Indication:Afib ?Last office visit:upcoming ?Weight:69.4 kg ?Age:76 ?Scr:0.9 ?CrCl:58.26 ml/min ? ?Prescription refilled ? ?

## 2021-11-20 ENCOUNTER — Telehealth: Payer: Self-pay

## 2021-11-20 NOTE — Telephone Encounter (Signed)
Advised patient of results/hd  

## 2021-11-20 NOTE — Telephone Encounter (Signed)
Patient states she has a new spot on her right leg under the bandage that the [facility] put on. She says it looks like another SCC but thinks she has at least another 3 weeks or so before she is done being treated at the [facility]. She would like to know if she should wait or should she come in sooner? ?

## 2021-11-20 NOTE — Telephone Encounter (Signed)
I left a message on her voicemail

## 2021-11-20 NOTE — Telephone Encounter (Signed)
-----   Message from Ralene Bathe, MD sent at 11/18/2021 11:22 AM EDT ----- ?Diagnosis ?Skin , right medial thigh ?WELL DIFFERENTIATED SQUAMOUS CELL CARCINOMA, BASE INVOLVED ? ?Cancer - SCC ?Already treated ?Watch for re-growth ?Recheck next visit ?

## 2021-11-21 ENCOUNTER — Telehealth: Payer: Self-pay | Admitting: Internal Medicine

## 2021-11-21 ENCOUNTER — Other Ambulatory Visit: Payer: Self-pay | Admitting: Cardiovascular Disease

## 2021-11-21 ENCOUNTER — Other Ambulatory Visit: Payer: Self-pay | Admitting: Internal Medicine

## 2021-11-21 NOTE — Telephone Encounter (Signed)
Please advise for refill pt overdue for 12 month fu.  ?Last seen in 2021 has future appointment scheduled. ?

## 2021-11-21 NOTE — Telephone Encounter (Signed)
Patient called and needs a refill of Lomotil.  She has been taking it on an as-needed basis since first prescribed and says it's the only thing that works well for her when she's having a flare and she's completely out. ?

## 2021-11-21 NOTE — Telephone Encounter (Signed)
I have forwarded the request to Dr Carlean Purl and I am awaiting his response. I spoke with Laqueta and she just uses it prn. She didn't realize she was out. ?

## 2021-11-21 NOTE — Telephone Encounter (Signed)
Requesting refill Sir? ?

## 2021-11-22 ENCOUNTER — Other Ambulatory Visit: Payer: Self-pay | Admitting: Internal Medicine

## 2021-11-22 ENCOUNTER — Encounter: Payer: PPO | Attending: Internal Medicine | Admitting: Internal Medicine

## 2021-11-22 ENCOUNTER — Telehealth: Payer: Self-pay | Admitting: Internal Medicine

## 2021-11-22 ENCOUNTER — Encounter: Payer: Self-pay | Admitting: Dermatology

## 2021-11-22 DIAGNOSIS — L97818 Non-pressure chronic ulcer of other part of right lower leg with other specified severity: Secondary | ICD-10-CM | POA: Insufficient documentation

## 2021-11-22 DIAGNOSIS — T8131XD Disruption of external operation (surgical) wound, not elsewhere classified, subsequent encounter: Secondary | ICD-10-CM | POA: Insufficient documentation

## 2021-11-22 DIAGNOSIS — X58XXXD Exposure to other specified factors, subsequent encounter: Secondary | ICD-10-CM | POA: Insufficient documentation

## 2021-11-22 DIAGNOSIS — C44722 Squamous cell carcinoma of skin of right lower limb, including hip: Secondary | ICD-10-CM | POA: Diagnosis not present

## 2021-11-22 MED ORDER — DIPHENOXYLATE-ATROPINE 2.5-0.025 MG PO TABS
1.0000 | ORAL_TABLET | Freq: Four times a day (QID) | ORAL | 3 refills | Status: DC | PRN
Start: 2021-11-22 — End: 2022-08-03

## 2021-11-22 MED ORDER — DIPHENOXYLATE-ATROPINE 2.5-0.025 MG PO TABS: 1.0000 | ORAL_TABLET | Freq: Four times a day (QID) | ORAL | 3 refills | Status: DC | PRN

## 2021-11-22 NOTE — Telephone Encounter (Signed)
We got confirmation that it went thru. ?

## 2021-11-22 NOTE — Telephone Encounter (Signed)
Tried to send digitally - EMR would not allow ? ?Will try print/fax ?

## 2021-11-22 NOTE — Telephone Encounter (Signed)
I spoke to [facility] at [phone] and Gerald Stabs said it would be okay to fax the rx to them at fax [phone]. I told Hanni we are working on getting the rx to them.  ?

## 2021-11-23 ENCOUNTER — Ambulatory Visit: Payer: PPO | Admitting: Dermatology

## 2021-11-23 ENCOUNTER — Other Ambulatory Visit: Payer: Self-pay | Admitting: Cardiovascular Disease

## 2021-11-23 DIAGNOSIS — L578 Other skin changes due to chronic exposure to nonionizing radiation: Secondary | ICD-10-CM | POA: Diagnosis not present

## 2021-11-23 DIAGNOSIS — C44722 Squamous cell carcinoma of skin of right lower limb, including hip: Secondary | ICD-10-CM

## 2021-11-23 DIAGNOSIS — D489 Neoplasm of uncertain behavior, unspecified: Secondary | ICD-10-CM

## 2021-11-23 NOTE — Patient Instructions (Addendum)
?Electrodesiccation and Curettage (?Scrape and Burn?) Wound Care Instructions ? ?Leave the original bandage on for 24 hours if possible.  If the bandage becomes soaked or soiled before that time, it is OK to remove it and examine the wound.  A small amount of post-operative bleeding is normal.  If excessive bleeding occurs, remove the bandage, place gauze over the site and apply continuous pressure (no peeking) over the area for 30 minutes. If this does not work, please call our clinic as soon as possible or page your doctor if it is after hours.  ? ?Once a day, cleanse the wound with soap and water. It is fine to shower. If a thick crust develops you may use a Q-tip dipped into dilute hydrogen peroxide (mix 1:1 with water) to dissolve it.  Hydrogen peroxide can slow the healing process, so use it only as needed.   ? ?After washing, apply petroleum jelly (Vaseline) or an antibiotic ointment if your doctor prescribed one for you, followed by a bandage.   ? ?For best healing, the wound should be covered with a layer of ointment at all times. If you are not able to keep the area covered with a bandage to hold the ointment in place, this may mean re-applying the ointment several times a day.  Continue this wound care until the wound has healed and is no longer open. It may take several weeks for the wound to heal and close. ? ?Itching and mild discomfort is normal during the healing process. ? ?If you have any discomfort, you can take Tylenol (acetaminophen) or ibuprofen as directed on the bottle. (Please do not take these if you have an allergy to them or cannot take them for another reason). ? ?Some redness, tenderness and white or yellow material in the wound is normal healing.  If the area becomes very sore and red, or develops a thick yellow-green material (pus), it may be infected; please notify us.   ? ?Wound healing continues for up to one year following surgery. It is not unusual to experience pain in the scar  from time to time during the interval.  If the pain becomes severe or the scar thickens, you should notify the office.   ? ?A slight amount of redness in a scar is expected for the first six months.  After six months, the redness will fade and the scar will soften and fade.  The color difference becomes less noticeable with time.  If there are any problems, return for a post-op surgery check at your earliest convenience. ? ?To improve the appearance of the scar, you can use silicone scar gel, cream, or sheets (such as Mederma or Serica) every night for up to one year. These are available over the counter (without a prescription). ? ?Please call our office at [phone] for any questions or concerns. ? ? ? ? ?Biopsy Wound Care Instructions ? ?Leave the original bandage on for 24 hours if possible.  If the bandage becomes soaked or soiled before that time, it is OK to remove it and examine the wound.  A small amount of post-operative bleeding is normal.  If excessive bleeding occurs, remove the bandage, place gauze over the site and apply continuous pressure (no peeking) over the area for 30 minutes. If this does not work, please call our clinic as soon as possible or page your doctor if it is after hours.  ? ?Once a day, cleanse the wound with soap and water. It is fine  to shower. If a thick crust develops you may use a Q-tip dipped into dilute hydrogen peroxide (mix 1:1 with water) to dissolve it.  Hydrogen peroxide can slow the healing process, so use it only as needed.   ? ?After washing, apply petroleum jelly (Vaseline) or an antibiotic ointment if your doctor prescribed one for you, followed by a bandage.   ? ?For best healing, the wound should be covered with a layer of ointment at all times. If you are not able to keep the area covered with a bandage to hold the ointment in place, this may mean re-applying the ointment several times a day.  Continue this wound care until the wound has healed and is no  longer open.  ? ?Itching and mild discomfort is normal during the healing process. However, if you develop pain or severe itching, please call our office.  ? ?If you have any discomfort, you can take Tylenol (acetaminophen) or ibuprofen as directed on the bottle. (Please do not take these if you have an allergy to them or cannot take them for another reason). ? ?Some redness, tenderness and white or yellow material in the wound is normal healing.  If the area becomes very sore and red, or develops a thick yellow-green material (pus), it may be infected; please notify us.   ? ?If you have stitches, return to clinic as directed to have the stitches removed. You will continue wound care for 2-3 days after the stitches are removed.  ? ?Wound healing continues for up to one year following surgery. It is not unusual to experience pain in the scar from time to time during the interval.  If the pain becomes severe or the scar thickens, you should notify the office.   ? ?A slight amount of redness in a scar is expected for the first six months.  After six months, the redness will fade and the scar will soften and fade.  The color difference becomes less noticeable with time.  If there are any problems, return for a post-op surgery check at your earliest convenience. ? ?To improve the appearance of the scar, you can use silicone scar gel, cream, or sheets (such as Mederma or Serica) every night for up to one year. These are available over the counter (without a prescription). ? ?Please call our office at [phone] for any questions or concerns. ? ? ? ? ? ? ? ? ? ?If You Need Anything After Your Visit ? ?If you have any questions or concerns for your doctor, please call our main line at [phone] and press option 4 to reach your doctor's medical assistant. If no one answers, please leave a voicemail as directed and we will return your call as soon as possible. Messages left after 4 pm will be answered the following  business day.  ? ?You may also send [place] a message via MyChart. We typically respond to MyChart messages within 1-2 business days. ? ?For prescription refills, please ask your pharmacy to contact our office. Our fax number is [phone]. ? ?If you have an urgent issue when the clinic is closed that cannot wait until the next business day, you can page your doctor at the number below.   ? ?Please note that while we do our best to be available for urgent issues outside of office hours, we are not available 24/7.  ? ?If you have an urgent issue and are unable to reach [place], you may choose to seek medical care at  your doctor's office, retail clinic, urgent care center, or emergency room. ? ?If you have a medical emergency, please immediately call 911 or go to the emergency department. ? ?Pager Numbers ? ?- Dr. Nehemiah Massed: [phone] ? ?- Dr. Laurence Ferrari: [phone] ? ?- Dr. Nicole Kindred: [phone] ? ?In the event of inclement weather, please call our main line at [phone] for an update on the status of any delays or closures. ? ?Dermatology Medication Tips: ?Please keep the boxes that topical medications come in in order to help keep track of the instructions about where and how to use these. Pharmacies typically print the medication instructions only on the boxes and not directly on the medication tubes.  ? ?If your medication is too expensive, please contact our office at [phone] option 4 or send [place] a message through [place].  ? ?We are unable to tell what your co-pay for medications will be in advance as this is different depending on your insurance coverage. However, we may be able to find a substitute medication at lower cost or fill out paperwork to get insurance to cover a needed medication.  ? ?If a prior authorization is required to get your medication covered by your insurance company, please allow [place] 1-2 business days to complete this process. ? ?Drug prices often vary depending on where the prescription is  filled and some pharmacies may offer cheaper prices. ? ?The website www.goodrx.com contains coupons for medications through different pharmacies. The prices here do not account for what the cost may be with he

## 2021-11-23 NOTE — Progress Notes (Signed)
ALIRA, FRETWELL ([id number]) ?Visit Report for 11/22/2021 ?Arrival Information Details ?Patient Name: Heather, Clark ?Date of Service: 11/22/2021 2:00 PM ?Medical Record Number: [id number] ?Patient Account Number: [id number] ?Date of Birth/Sex: 03-11-1946 (76 y.o. F) ?Treating RN: Levora Dredge ?Primary Care : Harold Barban Other Clinician: ?Referring : Harold Barban ?Treating /Extender: Kalman Shan ?Weeks in Treatment: 3 ?Visit Information History Since Last Visit ?Added or deleted any medications: No ?Patient Arrived: Ambulatory ?Any new allergies or adverse reactions: No ?Arrival Time: 14:28 ?Had a fall or experienced change in No ?Accompanied By: husband ?activities of daily living that may affect ?Transfer Assistance: None ?risk of falls: ?Patient Identification Verified: Yes ?Hospitalized since last visit: No ?Secondary Verification Process Completed: Yes ?Has Dressing in Place as Prescribed: Yes ?Patient Requires Transmission-Based Precautions: No ?Pain Present Now: No ?Patient Has Alerts: No ?Electronic Signature(s) ?Signed: 11/22/2021 4:16:06 PM By: Levora Dredge ?Entered By: Levora Dredge on 11/22/2021 14:28:58 ?Heather, Clark ([id number]) ?-------------------------------------------------------------------------------- ?Lower Extremity Assessment Details ?Patient Name: Heather, Clark ?Date of Service: 11/22/2021 2:00 PM ?Medical Record Number: [id number] ?Patient Account Number: [id number] ?Date of Birth/Sex: 25-Aug-1945 (76 y.o. F) ?Treating RN: Cornell Barman ?Primary Care : Harold Barban Other Clinician: ?Referring : Harold Barban ?Treating /Extender: Kalman Shan ?Weeks in Treatment: 3 ?Edema Assessment ?Assessed: [Left: No] [Right: No] ?Edema: [Left: N] [Right: o] ?Vascular Assessment ?Pulses: ?Dorsalis Pedis ?Palpable: [Right:Yes] ?Electronic Signature(s) ?Signed: 11/23/2021 5:29:17 PM By: Gretta Cool, BSN, RN, CWS, Kim RN, BSN ?Entered By: Gretta Cool,  BSN, RN, CWS, Kim on 11/22/2021 14:38:57 ?Heather, Clark ([id number]) ?-------------------------------------------------------------------------------- ?Multi Wound Chart Details ?Patient Name: Heather, Clark ?Date of Service: 11/22/2021 2:00 PM ?Medical Record Number: [id number] ?Patient Account Number: [id number] ?Date of Birth/Sex: [birth year] (76 y.o. F) ?Treating RN: Cornell Barman ?Primary Care : Harold Barban Other Clinician: ?Referring : Harold Barban ?Treating /Extender: Kalman Shan ?Weeks in Treatment: 3 ?Vital Signs ?Height(in): 67 ?Pulse(bpm): 58 ?Weight(lbs): 151 ?Blood Pressure(mmHg): 106/59 ?Body Mass Index(BMI): 23.6 ?Temperature(??F): 97.6 ?Respiratory Rate(breaths/min): 18 ?Photos: [3:No Photos] [4:No Photos] [N/A:N/A] ?Wound Location: [3:Right Lower Leg] [4:Right, Medial Lower Leg] [N/A:N/A] ?Wounding Event: [3:Surgical Injury] [4:Gradually Appeared] [N/A:N/A] ?Primary Etiology: [3:Open Surgical Wound] [4:Open Surgical Wound] [N/A:N/A] ?Date Acquired: [3:06/22/2021] [4:06/22/2021] [N/A:N/A] ?Weeks of Treatment: [3:3] [4:3] [N/A:N/A] ?Wound Status: [3:Open] [4:Open] [N/A:N/A] ?Wound Recurrence: [3:No] [4:No] [N/A:N/A] ?Measurements L x W x D (cm) [3:0.3x0.3x0.1] [4:2.2x2.3x0.1] [N/A:N/A] ?Area (cm?) : [3:0.071] [4:3.974] [N/A:N/A] ?Volume (cm?) : [3:0.007] [4:0.397] [N/A:N/A] ?% Reduction in Area: [3:91.80%] [4:53.10%] [N/A:N/A] ?% Reduction in Volume: [3:96.00%] [4:76.60%] [N/A:N/A] ?Classification: [3:Full Thickness Without Exposed Support Structures] [4:Full Thickness Without Exposed Support Structures] [N/A:N/A] ?Exudate Amount: [3:Medium] [4:Medium] [N/A:N/A] ?Exudate Type: [3:Serosanguineous red, brown] [4:Serosanguineous red, brown] [N/A:N/A N/A] ?Treatment Notes ?Electronic Signature(s) ?Signed: 11/23/2021 5:29:17 PM By: Gretta Cool, BSN, RN, CWS, Kim RN, BSN ?Entered By: Gretta Cool, BSN, RN, CWS, Kim on 11/22/2021 14:55:17 ?Heather, Clark  ([id number]) ?-------------------------------------------------------------------------------- ?Multi-Disciplinary Care Plan Details ?Patient Name: Heather, Clark ?Date of Service: 11/22/2021 2:00 PM ?Medical Record Number: [id number] ?Patient Account Number: [id number] ?Date of Birth/Sex: 05-13-1946 (76 y.o. F) ?Treating RN: Cornell Barman ?Primary Care : Harold Barban Other Clinician: ?Referring : Harold Barban ?Treating /Extender: Kalman Shan ?Weeks in Treatment: 3 ?Active Inactive ?Malignancy/Atypical Etiology ?Nursing Diagnoses: ?Knowledge deficit related to disease process and management of atypical ulcer etiology ?Knowledge deficit related to disease process and management of malignancy ?Goals: ?Patient/caregiver will verbalize understanding of disease process and disease management of atypical ulcer etiology ?Date Initiated: 11/22/2021 ?Target Resolution Date: 11/22/2021 ?Goal Status: Active ?Patient/caregiver will verbalize understanding of  disease process and disease management of malignancy ?Date Initiated: 11/22/2021 ?Target Resolution Date: 11/22/2021 ?Goal Status: Active ?Interventions: ?Assess patient and family medical history for signs and symptoms of malignancy/atypical etiology upon admission ?Provide education on atypical ulcer etiologies ?Provide education on malignant ulcerations ?Notes: ?Wound/Skin Impairment ?Nursing Diagnoses: ?Knowledge deficit related to ulceration/compromised skin integrity ?Goals: ?Ulcer/skin breakdown will have a volume reduction of 30% by week 4 ?Date Initiated: 11/01/2021 ?Target Resolution Date: 12/01/2021 ?Goal Status: Active ?Ulcer/skin breakdown will have a volume reduction of 50% by week 8 ?Date Initiated: 11/01/2021 ?Target Resolution Date: 12/01/2021 ?Goal Status: Active ?Ulcer/skin breakdown will have a volume reduction of 80% by week 12 ?Date Initiated: 11/01/2021 ?Target Resolution Date: 01/01/2022 ?Goal Status: Active ?Ulcer/skin breakdown  will heal within 14 weeks ?Date Initiated: 11/01/2021 ?Target Resolution Date: 01/31/2022 ?Goal Status: Active ?Interventions: ?Assess patient/caregiver ability to obtain necessary supplies ?Assess patient/caregiver ability to perform ulcer/skin care regimen upon admission and as needed ?Assess ulceration(s) every visit ?Notes: ?Electronic Signature(s) ?Signed: 11/23/2021 5:29:17 PM By: Gretta Cool, BSN, RN, CWS, Kim RN, BSN ?Entered By: Gretta Cool, BSN, RN, CWS, Kim on 11/22/2021 14:39:53 ?Heather, Clark ([id number]) ?-------------------------------------------------------------------------------- ?Pain Assessment Details ?Patient Name: Heather, Clark ?Date of Service: 11/22/2021 2:00 PM ?Medical Record Number: [id number] ?Patient Account Number: [id number] ?Date of Birth/Sex: 1946/03/10 (76 y.o. F) ?Treating RN: Levora Dredge ?Primary Care : Harold Barban Other Clinician: ?Referring : Harold Barban ?Treating /Extender: Kalman Shan ?Weeks in Treatment: 3 ?Active Problems ?Location of Pain Severity and Description of Pain ?Patient Has Paino No ?Site Locations ?Rate the pain. ?Current Pain Level: 0 ?Pain Management and Medication ?Current Pain Management: ?Electronic Signature(s) ?Signed: 11/22/2021 4:16:06 PM By: Levora Dredge ?Entered By: Levora Dredge on 11/22/2021 14:32:06 ?Heather, Clark ([id number]) ?-------------------------------------------------------------------------------- ?Wound Assessment Details ?Patient Name: Heather, Clark ?Date of Service: 11/22/2021 2:00 PM ?Medical Record Number: [id number] ?Patient Account Number: [id number] ?Date of Birth/Sex: 1945/12/19 (76 y.o. F) ?Treating RN: Cornell Barman ?Primary Care : Harold Barban Other Clinician: ?Referring : Harold Barban ?Treating /Extender: Kalman Shan ?Weeks in Treatment: 3 ?Wound Status ?Wound Number: 3 Primary Open Surgical Wound ?Etiology: ?Wound Location: Right Lower Leg ?Wound Status: Healed  - Epithelialized ?Wounding Event: Surgical Injury ?Comorbid Cataracts, Arrhythmia, Hypertension, Colitis, Received ?Date Acquired: 06/22/2021 ?History: Chemotherapy ?Weeks Of Treatment: 3 ?Clustered Wound: No ?Photos ?Photo Uploaded By: Lottie Dawson

## 2021-11-23 NOTE — Progress Notes (Signed)
Follow-Up Visit   Subjective  [name] is a 76 y.o. female who presents for the following: Follow-up (Patient reports noticing a new spot at right ankle. She reports a hx of skin cancer on right leg. ). Patient would like to discuss any preventative treatments to help protect against skin cancer.  Discussed Niacinamide or Nicotinamide '500mg'$  twice per day to lower risk of non-melanoma skin cancer by approximately 25%.  Patient reports hx of allergic reaction after taking niacinamide or nicotinamide  The patient has spots, moles and lesions to be evaluated, some may be new or changing and the patient has concerns that these could be cancer.  The following portions of the chart were reviewed this encounter and updated as appropriate:  Tobacco  Allergies  Meds  Problems  Med Hx  Surg Hx  Fam Hx     Review of Systems: No other skin or systemic complaints except as noted in HPI or Assessment and Plan.  Objective  Well appearing patient in no apparent distress; mood and affect are within normal limits.  A focused examination was performed including right medial ankle. Relevant physical exam findings are noted in the Assessment and Plan.  right medial ankle 1.2 cm  hyperkeratotic papule          Assessment & Plan  Neoplasm of uncertain behavior right medial ankle Epidermal / dermal shaving  Lesion diameter (cm):  1.2 Informed consent: discussed and consent obtained   Timeout: patient name, date of birth, surgical site, and procedure verified   Procedure prep:  Patient was prepped and draped in usual sterile fashion Prep type:  Isopropyl alcohol Anesthesia: the lesion was anesthetized in a standard fashion   Anesthetic:  1% lidocaine w/ epinephrine 1-100,000 buffered w/ 8.4% NaHCO3 Instrument used: flexible razor blade   Hemostasis achieved with: pressure, aluminum chloride and electrodesiccation   Outcome: patient tolerated procedure well   Post-procedure details:  sterile dressing applied and wound care instructions given   Dressing type: bandage and petrolatum    Destruction of lesion Complexity: Extensive Destruction method: Electrodesiccation and curettage Informed consent: discussed and consent obtained   Timeout:  patient name, date of birth, surgical site, and procedure verified Lesion destroyed using liquid nitrogen: Yes   Region frozen until ice ball extended beyond lesion: Yes   Lesion length (cm):  1.2 Lesion width (cm):  1.2 Margin per side (cm):  0.2 Final wound size (cm):  1.6 Outcome: patient tolerated procedure well with no complications   Post-procedure details: wound care instructions given   Additional details:  Prior to procedure, discussed risks of blister formation, small wound, skin dyspigmentation, or rare scar following treatment. Recommend Vaseline ointment to treated areas while healing.  Specimen 1 - Surgical pathology Differential Diagnosis: r/o scc Check Margins: No R/o SCC  Actinic Damage - chronic, secondary to cumulative UV radiation exposure/sun exposure over time - diffuse scaly erythematous macules with underlying dyspigmentation - Recommend daily broad spectrum sunscreen SPF 30+ to sun-exposed areas, reapply every 2 hours as needed.  - Recommend staying in the shade or wearing long sleeves, sun glasses (UVA+UVB protection) and wide brim hats (4-inch brim around the entire circumference of the hat). - Call for new or changing lesions.  Patient would like to discuss any preventative treatments to help protect against skin cancer.  Discussed Niacinamide or Nicotinamide '500mg'$  twice per day to lower risk of non-melanoma skin cancer by approximately 25%.  Patient reports hx of allergic reaction after taking niacinamide or  nicotinamide - so cannot take this.  Return if symptoms worsen or fail to improve, for keep follow up as scheduled. IRuthell Rummage, CMA, am acting as scribe for Sarina Ser,  MD. Documentation: I have reviewed the above documentation for accuracy and completeness, and I agree with the above.  Sarina Ser, MD

## 2021-11-23 NOTE — Progress Notes (Signed)
SCARLET, ABAD ([id number]) ?Visit Report for 11/22/2021 ?Chief Complaint Document Details ?Patient Name: [name], [name] ?Date of Service: 11/22/2021 2:00 PM ?Medical Record Number: [id number] ?Patient Account Number: [id number] ?Date of Birth/Sex: 1946/07/20 (76 y.o. F) ?Treating RN: Cornell Barman ?Primary Care Provider: Harold Barban Other Clinician: ?Referring Provider: Harold Barban ?Treating Provider/Extender: Kalman Shan ?Weeks in Treatment: 3 ?Information Obtained from: Patient ?Chief Complaint ?11/01/2021; patient returns to clinic with 2 wounds on the right anterior lower leg. These are secondary to surgical extractions of squamous cell ?carcinomas ?Electronic Signature(s) ?Signed: 11/22/2021 3:24:58 PM By: Kalman Shan DO ?Entered By: Kalman Shan on 11/22/2021 15:13:22 ?KINZLEY, SAVELL ([id number]) ?-------------------------------------------------------------------------------- ?Debridement Details ?Patient Name: [name], [name] ?Date of Service: 11/22/2021 2:00 PM ?Medical Record Number: [id number] ?Patient Account Number: [id number] ?Date of Birth/Sex: 1946/03/24 (76 y.o. F) ?Treating RN: Cornell Barman ?Primary Care Provider: Harold Barban Other Clinician: ?Referring Provider: Harold Barban ?Treating Provider/Extender: Kalman Shan ?Weeks in Treatment: 3 ?Debridement Performed for ?Wound #4 Right,Medial Lower Leg ?Assessment: ?Performed By: Physician Kalman Shan, MD ?Debridement Type: Debridement ?Level of Consciousness (Pre- ?Awake and Alert ?procedure): ?Pre-procedure Verification/Time Out ?Yes - 14:59 ?Taken: ?Total Area Debrided (L x W): 2.2 (cm) x 2.3 (cm) = 5.06 (cm?) ?Tissue and other material ?Viable, Non-Viable, Slough, Subcutaneous, Deepwater ?debrided: ?Level: Skin/Subcutaneous Tissue ?Debridement Description: Excisional ?Instrument: Curette ?Bleeding: Minimum ?Hemostasis Achieved: Pressure ?Response to Treatment: Procedure was tolerated well ?Level of Consciousness  (Post- ?Awake and Alert ?procedure): ?Post Debridement Measurements of Total Wound ?Length: (cm) 2.2 ?Width: (cm) 2.3 ?Depth: (cm) 0.1 ?Volume: (cm?) 0.397 ?Character of Wound/Ulcer Post Debridement: Stable ?Post Procedure Diagnosis ?Same as Pre-procedure ?Electronic Signature(s) ?Signed: 11/22/2021 3:24:58 PM By: Kalman Shan DO ?Signed: 11/23/2021 5:29:17 PM By: Gretta Cool, BSN, RN, CWS, Kim RN, BSN ?Entered By: Gretta Cool, BSN, RN, CWS, Kim on 11/22/2021 15:01:55 ?[name], [name] ([id number]) ?-------------------------------------------------------------------------------- ?HPI Details ?Patient Name: [name], [name] ?Date of Service: 11/22/2021 2:00 PM ?Medical Record Number: [id number] ?Patient Account Number: [id number] ?Date of Birth/Sex: 1946-06-23 (76 y.o. F) ?Treating RN: Cornell Barman ?Primary Care Provider: Harold Barban Other Clinician: ?Referring Provider: Harold Barban ?Treating Provider/Extender: Kalman Shan ?Weeks in Treatment: 3 ?History of Present Illness ?HPI Description: ADMISSION ?11/02/2020 ?This is a 76 year old woman who spent 3 months in [place] just returning yesterday. On March 7 she was picking oranges for the need he had a ?fall and developed a skin tear on her left lower leg she is also able to show me pictures. She was seen in ER or urgent care. They tried to replace ?the skin with Steri-Strips. She suddenly developed swelling in the area. She went back to the urgent care. She has been using Bactroban and ?Steri-Strips to try and reapplied approximate things ever since. She does have atrial fibrillation on anticoagulants. ?Past medical history includes paroxysmal atrial fibrillation, multiple skin problems followed by Dr. Nehemiah Massed at [facility] including seborrheic ?keratosis melanotic nevi hemangiomas and actinic damage secondary to chronic UV exposure. She also has a history of squamous cell carcinoma ?of the skin and has been treated with radiation to her legs at one point as well as  I think phototherapy. She also has hyperlipidemia. ?ABI in our clinic was 1.19 on the left ?11/08/2020 upon evaluation today patient appears to be doing well with regard to her wound on her leg. This is showing signs of good ?improvement which is great news and overall there does not appear to be any signs of active infection at this time which is great news. Obviously ?this  is dramatically improved even compared to last week when Dr. Dellia Nims saw her. ?4/27; left anterior leg wound trauma in the setting of chronic venous insufficiency and likely solar skin damage. In general this is better than last ?time I saw this. Healthy looking granulation. There is a small tunnel from about 11-12 o'clock at 0.4 cm. Using silver algia ?5/4; left anterior lower leg wound looks a lot better. This is gradually closing over. There is some eschar and flaking skin around the edges of this ?wound. She shows me a small raised area in the anterior midline more towards the ankle. She also tells me she has a history of squamous cell ?skin cancer. ?5/11; her left anterior lower leg wound which was traumatic in the setting of chronic venous insufficiency actually looks a lot better it is just about ?closed ?The last time I saw this she had what looked to be a squamous cell carcinoma on the left lower leg also anteriorly. I sent her back to dermatology ?they remove this it was apparently squamous cell carcinoma as she has had ofttimes before. This is now opened into a wound as well deeper but ?with a clean wound surface ?5/18; her traumatic wound on the left upper tibia is closed. The area that there is a squamous cell carcinoma that was removed by dermatology is ?also almost closed over. She has a follow-up with them sometime late next week. I am hopeful we will be able to heal her out from the wound ?point of view before then. ?5/25; her traumatic wound on the upper tibia is closed. The squamous cell surgery site had slight amount of eschar  over this. She is complaining ?of irritation of her skin probably a result of the compression. She also has a small area just under her original traumatic wound that popped up ?under compression this week ?READMISSION ?11/01/2021 ?This is a now [age]-year-old woman that we had in the clinic last year at that time she had a wound on her left lower leg which was an abrasion ?from a fall and also a secondary wound from a surgical extraction of a squamous cell carcinoma. These areas healed. She tells me that currently ?she had 2 areas removed by Dr. Nehemiah Massed dermatology at [facility] in December. He felt he fully extracted these. As per her usual she goes ?to [place] for the winter. Unfortunately the larger area which is more medial developed obvious recurrences these were extracted by dermatology ?in [place]. She then underwent Mohs surgery and because of recurrent positive margin she had 2 further Mohs surgeries. There was no repair ?with the wounds set to close by secondary intention. Before she left [place] at the end of March she was prescribed cephalexin but she did not ?take this because she did not think they were infected [history of C. difficile]. She has been using some form of leftover dressings she had from ?last yearo [place] ?She returns to the [place] area with 2 wounds to the larger 1 a bit superior and medial and the smaller one more central. Both of these have ?nonviable surfaces. She has chronic venous insufficiency and solar induced skin damage ?No change in her past medical history. Her ABI was normal on the right ?11/15/2021; patient presents for follow-up. She has been using Hydrofera Blue every couple days. She has no issues or complaints today. She ?denies signs of infection. ?5/3; patient presents for follow-up. She has been using Hydrofera Blue to the wound beds. She has no issues  or complaints today. The right ?lateral lower extremity wound has healed. She denies signs of  infection. ?Electronic Signature(s) ?Signed: 11/22/2021 3:24:58 PM By: Kalman Shan DO ?Entered By: Kalman Shan on 11/22/2021 15:14:47 ?[name], [name] ([id number]) ?[name], [name] ([id number]) ?------------------------

## 2021-11-27 ENCOUNTER — Telehealth: Payer: Self-pay

## 2021-11-27 NOTE — Telephone Encounter (Signed)
LMOVM please return my call  ?

## 2021-11-27 NOTE — Telephone Encounter (Signed)
-----   Message from Ralene Bathe, MD sent at 11/26/2021  6:28 PM EDT ----- ?Diagnosis ?Skin , right medial ankle ?WELL DIFFERENTIATED SQUAMOUS CELL CARCINOMA WITH SUPERFICIAL INFILTRATION, CLOSE TO MARGIN ? ?Cancer - SCC ?Already treated ?Recheck next visit ?

## 2021-11-29 ENCOUNTER — Encounter (HOSPITAL_BASED_OUTPATIENT_CLINIC_OR_DEPARTMENT_OTHER): Payer: PPO | Admitting: Internal Medicine

## 2021-11-29 DIAGNOSIS — C44722 Squamous cell carcinoma of skin of right lower limb, including hip: Secondary | ICD-10-CM

## 2021-11-29 DIAGNOSIS — L97818 Non-pressure chronic ulcer of other part of right lower leg with other specified severity: Secondary | ICD-10-CM

## 2021-11-29 DIAGNOSIS — T8131XD Disruption of external operation (surgical) wound, not elsewhere classified, subsequent encounter: Secondary | ICD-10-CM | POA: Diagnosis not present

## 2021-11-29 NOTE — Progress Notes (Signed)
EARNEST, THALMAN ([id number]) ?Visit Report for 11/29/2021 ?Chief Complaint Document Details ?Patient Name: [name], [name] ?Date of Service: 11/29/2021 11:30 AM ?Medical Record Number: [id number] ?Patient Account Number: [id number] ?Date of Birth/Sex: [birth year] (76 y.o. F) ?Treating RN: Donnamarie Poag ?Primary Care Provider: Harold Barban Other Clinician: ?Referring Provider: Harold Barban ?Treating Provider/Extender: Kalman Shan ?Weeks in Treatment: 4 ?Information Obtained from: Patient ?Chief Complaint ?11/01/2021; patient returns to clinic with 2 wounds on the right anterior lower leg. These are secondary to surgical extractions of squamous cell ?carcinomas ?Electronic Signature(s) ?Signed: 11/29/2021 12:25:41 PM By: Kalman Shan DO ?Entered By: Kalman Shan on 11/29/2021 12:07:39 ?[name], [name] ([id number]) ?-------------------------------------------------------------------------------- ?Debridement Details ?Patient Name: [name], [name] ?Date of Service: 11/29/2021 11:30 AM ?Medical Record Number: [id number] ?Patient Account Number: [id number] ?Date of Birth/Sex: 02/03/1946 (76 y.o. F) ?Treating RN: Donnamarie Poag ?Primary Care Provider: Harold Barban Other Clinician: ?Referring Provider: Harold Barban ?Treating Provider/Extender: Kalman Shan ?Weeks in Treatment: 4 ?Debridement Performed for ?Wound #5 Right,Distal,Anterior Lower Leg ?Assessment: ?Performed By: Physician Kalman Shan, MD ?Debridement Type: Debridement ?Level of Consciousness (Pre- ?Awake and Alert ?procedure): ?Pre-procedure Verification/Time Out ?Yes - 11:43 ?Taken: ?Start Time: 11:44 ?Pain Control: Lidocaine ?Total Area Debrided (L x W): 1 (cm) x 0.9 (cm) = 0.9 (cm?) ?Tissue and other material ?Viable, Non-Viable, Slough, Subcutaneous, [place] ?debrided: ?Level: Skin/Subcutaneous Tissue ?Debridement Description: Excisional ?Instrument: Curette ?Bleeding: Minimum ?Hemostasis Achieved: Pressure ?Response to Treatment:  Procedure was tolerated well ?Level of Consciousness (Post- ?Awake and Alert ?procedure): ?Post Debridement Measurements of Total Wound ?Length: (cm) 1 ?Width: (cm) 0.9 ?Depth: (cm) 0.1 ?Volume: (cm?) 0.071 ?Character of Wound/Ulcer Post Debridement: Improved ?Post Procedure Diagnosis ?Same as Pre-procedure ?Electronic Signature(s) ?Signed: 11/29/2021 12:25:41 PM By: Kalman Shan DO ?Signed: 11/29/2021 2:56:37 PM By: Donnamarie Poag ?Entered ByDonnamarie Poag on 11/29/2021 11:44:39 ?[name], [name] ([id number]) ?-------------------------------------------------------------------------------- ?Debridement Details ?Patient Name: [name] ?Date of Service: 11/29/2021 11:30 AM ?Medical Record Number: [id number] ?Patient Account Number: [id number] ?Date of Birth/Sex: 1946/05/10 (76 y.o. F) ?Treating RN: Donnamarie Poag ?Primary Care Provider: Harold Barban Other Clinician: ?Referring Provider: Harold Barban ?Treating Provider/Extender: Kalman Shan ?Weeks in Treatment: 4 ?Debridement Performed for ?Wound #6 Right,Medial Upper Leg ?Assessment: ?Performed By: Physician Kalman Shan, MD ?Debridement Type: Debridement ?Level of Consciousness (Pre- ?Awake and Alert ?procedure): ?Pre-procedure Verification/Time Out ?Yes - 11:43 ?Taken: ?Start Time: 11:45 ?Pain Control: Lidocaine ?Total Area Debrided (L x W): 0.9 (cm) x 0.5 (cm) = 0.45 (cm?) ?Tissue and other material ?Viable, Non-Viable, Slough, Subcutaneous, [place] ?debrided: ?Level: Skin/Subcutaneous Tissue ?Debridement Description: Excisional ?Instrument: Curette ?Bleeding: Minimum ?Hemostasis Achieved: Pressure ?Response to Treatment: Procedure was tolerated well ?Level of Consciousness (Post- ?Awake and Alert ?procedure): ?Post Debridement Measurements of Total Wound ?Length: (cm) 0.9 ?Width: (cm) 0.5 ?Depth: (cm) 0.1 ?Volume: (cm?) 0.035 ?Character of Wound/Ulcer Post Debridement: Improved ?Post Procedure Diagnosis ?Same as Pre-procedure ?Electronic  Signature(s) ?Signed: 11/29/2021 12:25:41 PM By: Kalman Shan DO ?Signed: 11/29/2021 2:56:37 PM By: Donnamarie Poag ?Entered ByDonnamarie Poag on 11/29/2021 11:45:10 ?[name], [name] ([id number]) ?-------------------------------------------------------------------------------- ?Debridement Details ?Patient Name: [name], [name] ?Date of Service: 11/29/2021 11:30 AM ?Medical Record Number: [id number] ?Patient Account Number: [id number] ?Date of Birth/Sex: November 05, 1945 (76 y.o. F) ?Treating RN: Donnamarie Poag ?Primary Care Provider: Harold Barban Other Clinician: ?Referring Provider: Harold Barban ?Treating Provider/Extender: Kalman Shan ?Weeks in Treatment: 4 ?Debridement Performed for ?Wound #4 Right,Proximal,Medial Lower Leg ?Assessment: ?Performed By: Physician Kalman Shan, MD ?Debridement Type: Debridement ?Level of Consciousness (Pre- ?Awake and Alert ?procedure): ?Pre-procedure Verification/Time Out ?Yes - 11:43 ?  Taken: ?Start Time: 11:46 ?Pain Control: Lidocaine ?Total Area Debrided (L x W): 1 (cm) x 1.8 (cm) = 1.8 (cm?) ?Tissue and other material ?Viable, Non-Viable, Slough, Subcutaneous, [place] ?debrided: ?Level: Skin/Subcutaneous Tissue ?Debridement Description: Excisional ?Instrument: Curette ?Bleeding: Minimum ?Hemostasis Achieved: Pressure ?Response to Treatment: Procedure was tolerated well ?Level of Consciousness (Post- ?Awake and Alert ?procedure): ?Post Debridement Measurements of Total Wound ?Length: (cm) 1 ?Width: (cm) 1.8 ?Depth: (cm) 0.1 ?Volume: (cm?) 0.141 ?Character of Wound/Ulcer Post Debridement: Improved ?Post Procedure Diagnosis ?Same as Pre-procedure ?Electronic Signature(s) ?Signed: 11/29/2021 12:25:41 PM By: Kalman Shan DO ?Signed: 11/29/2021 2:56:37 PM By: Donnamarie Poag ?Entered ByDonnamarie Poag on 11/29/2021 11:45:47 ?[name], [name] ([id number]) ?-------------------------------------------------------------------------------- ?HPI Details ?Patient Name: [name], [name] ?Date of  Service: 11/29/2021 11:30 AM ?Medical Record Number: [id number] ?Patient Account Number: [id number] ?Date of Birth/Sex: 1946/06/12 (76 y.o. F) ?Treating RN: Donnamarie Poag ?Primary Care Provider: Harold Barban Other Clinician: ?Referring Provider: Harold Barban ?Treating Provider/Extender: Kalman Shan ?Weeks in Treatment: 4 ?History of Present Illness ?HPI Description: ADMISSION ?11/02/2020 ?This is a 76 year old woman who spent 3 months in [place] just returning yesterday. On March 7 she was picking oranges for the need he had a ?fall and developed a skin tear on her left lower leg she is also able to show me pictures. She was seen in ER or urgent care. They tried to replace ?the skin with Steri-Strips. She suddenly developed swelling in the area. She went back to the urgent care. She has been using Bactroban and ?Steri-Strips to try and reapplied approximate things ever since. She does have atrial fibrillation on anticoagulants. ?Past medical history includes paroxysmal atrial fibrillation, multiple skin problems followed by Dr. Nehemiah Massed at [facility] including seborrheic ?keratosis melanotic nevi hemangiomas and actinic damage secondary to chronic UV exposure. She also has a history of squamous cell carcinoma ?of the skin and has been treated with radiation to her legs at one point as well as I think phototherapy. She also has hyperlipidemia. ?ABI in our clinic was 1.19 on the left ?11/08/2020 upon evaluation today patient appears to be doing well with regard to her wound on her leg. This is showing signs of good ?improvement which is great news and overall there does not appear to be any signs of active infection at this time which is great news. Obviously ?this is dramatically improved even compared to last week when Dr. Dellia Nims saw her. ?4/27; left anterior leg wound trauma in the setting of chronic venous insufficiency and likely solar skin damage. In general this is better than last ?time I saw this.  Healthy looking granulation. There is a small tunnel from about 11-12 o'clock at 0.4 cm. Using silver algia ?5/4; left anterior lower leg wound looks a lot better. This is gradually closing over. There is some eschar

## 2021-11-29 NOTE — Progress Notes (Signed)
Clark, Heather ([id number]) ?Visit Report for 11/29/2021 ?Arrival Information Details ?Patient Name: [name], [name] ?Date of Service: 11/29/2021 11:30 AM ?Medical Record Number: [id number] ?Patient Account Number: [id number] ?Date of Birth/Sex: Dec 17, 1945 (76 y.o. F) ?Treating RN: Donnamarie Poag ?Primary Care : Harold Barban Other Clinician: ?Referring : Harold Barban ?Treating /Extender: Kalman Shan ?Weeks in Treatment: 4 ?Visit Information History Since Last Visit ?Added or deleted any medications: No ?Patient Arrived: Ambulatory ?Had a fall or experienced change in No ?Arrival Time: 11:29 ?activities of daily living that may affect ?Accompanied By: husband ?risk of falls: ?Transfer Assistance: None ?Hospitalized since last visit: No ?Patient Identification Verified: Yes ?Has Dressing in Place as Prescribed: Yes ?Secondary Verification Process Completed: Yes ?Pain Present Now: Yes ?Patient Requires Transmission-Based Precautions: No ?Patient Has Alerts: Yes ?Patient Alerts: NOT diabetic ?Electronic Signature(s) ?Signed: 11/29/2021 2:56:37 PM By: Donnamarie Poag ?Entered ByDonnamarie Poag on 11/29/2021 11:30:13 ?CALYNN, FERRERO ([id number]) ?-------------------------------------------------------------------------------- ?Encounter Discharge Information Details ?Patient Name: Heather, Clark ?Date of Service: 11/29/2021 11:30 AM ?Medical Record Number: [id number] ?Patient Account Number: [id number] ?Date of Birth/Sex: 1946-02-11 (76 y.o. F) ?Treating RN: Donnamarie Poag ?Primary Care : Harold Barban Other Clinician: ?Referring : Harold Barban ?Treating /Extender: Kalman Shan ?Weeks in Treatment: 4 ?Encounter Discharge Information Items Post Procedure Vitals ?Discharge Condition: Stable ?Temperature (?F): 98.3 ?Ambulatory Status: Ambulatory ?Pulse (bpm): 78 ?Discharge Destination: Home ?Respiratory Rate (breaths/min): 16 ?Transportation: Ambulance ?Blood Pressure  (mmHg): 116/66 ?Accompanied By: self ?Schedule Follow-up Appointment: Yes ?Clinical Summary of Care: ?Electronic Signature(s) ?Signed: 11/29/2021 2:56:37 PM By: Donnamarie Poag ?Entered ByDonnamarie Poag on 11/29/2021 11:59:17 ?CHARISH, SCHROEPFER ([id number]) ?-------------------------------------------------------------------------------- ?Lower Extremity Assessment Details ?Patient Name: Heather, Clark ?Date of Service: 11/29/2021 11:30 AM ?Medical Record Number: [id number] ?Patient Account Number: [id number] ?Date of Birth/Sex: December 25, 1945 (76 y.o. F) ?Treating RN: Donnamarie Poag ?Primary Care : Harold Barban Other Clinician: ?Referring : Harold Barban ?Treating /Extender: Kalman Shan ?Weeks in Treatment: 4 ?Edema Assessment ?Assessed: [Left: No] [Right: Yes] ?Edema: [Left: N] [Right: o] ?Calf ?Left: Right: ?Point of Measurement: 30 cm From Medial Instep 34 cm ?Ankle ?Left: Right: ?Point of Measurement: 8 cm From Medial Instep 20.3 cm ?Knee To Floor ?Left: Right: ?From Medial Instep 39 cm ?Vascular Assessment ?Pulses: ?Dorsalis Pedis ?Palpable: [Right:Yes] ?Electronic Signature(s) ?Signed: 11/29/2021 2:56:37 PM By: Donnamarie Poag ?Entered ByDonnamarie Poag on 11/29/2021 11:40:25 ?SAQUOIA, SIANEZ ([id number]) ?-------------------------------------------------------------------------------- ?Multi Wound Chart Details ?Patient Name: Clark, Heather ?Date of Service: 11/29/2021 11:30 AM ?Medical Record Number: [id number] ?Patient Account Number: [id number] ?Date of Birth/Sex: 1945-12-30 (76 y.o. F) ?Treating RN: Donnamarie Poag ?Primary Care : Harold Barban Other Clinician: ?Referring : Harold Barban ?Treating /Extender: Kalman Shan ?Weeks in Treatment: 4 ?Vital Signs ?Height(in): 67 ?Pulse(bpm): 78 ?Weight(lbs): 151 ?Blood Pressure(mmHg): 116/66 ?Body Mass Index(BMI): 23.6 ?Temperature(??F): 98.2 ?Respiratory Rate(breaths/min): 16 ?Photos: ?Wound Location: Right, Proximal,  Medial Lower Leg Right, Distal, Anterior Lower Leg Right, Medial Upper Leg ?Wounding Event: Gradually Appeared Surgical Injury Surgical Injury ?Primary Etiology: Open Surgical Wound Open Surgical Wound Open Surgical Wound ?Comorbid History: Cataracts, Arrhythmia, Cataracts, Arrhythmia, Cataracts, Arrhythmia, ?Hypertension, Colitis, Received Hypertension, Colitis, Received Hypertension, Colitis, Received ?Chemotherapy Chemotherapy Chemotherapy ?Date Acquired: 06/22/2021 11/23/2021 11/23/2021 ?Weeks of Treatment: 4 0 0 ?Wound Status: Open Open Open ?Wound Recurrence: No No No ?Measurements L x W x D (cm) 1x1.8x0.1 1x0.9x0.1 0.9x0.5x0.1 ?Area (cm?) : 1.414 0.707 0.353 ?Volume (cm?) : 0.141 0.071 0.035 ?% Reduction in Area: 83.30% N/A N/A ?% Reduction in Volume: 91.70% N/A N/A ?Classification: Full Thickness Without Exposed  Full Thickness Without Exposed Full Thickness Without Exposed ?Support Structures Support Structures Support Structures ?Exudate Amount: Medium Medium Medium ?Exudate Type: Serosanguineous Serosanguineous Serosanguineous ?Exudate Color: red, brown red, brown red, brown ?Granulation Amount: Medium (34-66%) Medium (34-66%) Small (1-33%) ?Granulation Quality: Red, Pink Red, Pink Red, Pink ?Necrotic Amount: Medium (34-66%) Medium (34-66%) Large (67-100%) ?Exposed Structures: ?Fat Layer (Subcutaneous Tissue): ?Fat Layer (Subcutaneous Tissue): Fat Layer (Subcutaneous Tissue): ?Yes Yes Yes ?Fascia: No ?Fascia: No ?Fascia: No ?Tendon: No ?Tendon: No ?Tendon: No ?Muscle: No ?Muscle: No ?Muscle: No ?Joint: No ?Joint: No ?Joint: No ?Bone: No ?Bone: No ?Bone: No ?Treatment Notes ?Electronic Signature(s) ?Signed: 11/29/2021 2:56:37 PM By: Donnamarie Poag ?Entered ByDonnamarie Poag on 11/29/2021 11:40:58 ?JAQUISHA, FRECH ([id number]) ?-------------------------------------------------------------------------------- ?Multi-Disciplinary Care Plan Details ?Patient Name: Clark, Heather ?Date of Service: 11/29/2021 11:30 AM ?Medical  Record Number: [id number] ?Patient Account Number: [id number] ?Date of Birth/Sex: 01/04/1946 (76 y.o. F) ?Treating RN: Donnamarie Poag ?Primary Care : Harold Barban Other Clinician: ?Referring : Harold Barban ?Treating /Extender: Kalman Shan ?Weeks in Treatment: 4 ?Active Inactive ?Malignancy/Atypical Etiology ?Nursing Diagnoses: ?Knowledge deficit related to disease process and management of atypical ulcer etiology ?Knowledge deficit related to disease process and management of malignancy ?Goals: ?Patient/caregiver will verbalize understanding of disease process and disease management of atypical ulcer etiology ?Date Initiated: 11/22/2021 ?Date Inactivated: 11/29/2021 ?Target Resolution Date: 11/22/2021 ?Goal Status: Met ?Patient/caregiver will verbalize understanding of disease process and disease management of malignancy ?Date Initiated: 11/22/2021 ?Target Resolution Date: 11/22/2021 ?Goal Status: Active ?Interventions: ?Assess patient and family medical history for signs and symptoms of malignancy/atypical etiology upon admission ?Provide education on atypical ulcer etiologies ?Provide education on malignant ulcerations ?Notes: ?Wound/Skin Impairment ?Nursing Diagnoses: ?Knowledge deficit related to ulceration/compromised skin integrity ?Goals: ?Ulcer/skin breakdown will have a volume reduction of 30% by week 4 ?Date Initiated: 11/01/2021 ?Target Resolution Date: 12/01/2021 ?Goal Status: Active ?Ulcer/skin breakdown will have a volume reduction of 50% by week 8 ?Date Initiated: 11/01/2021 ?Target Resolution Date: 12/01/2021 ?Goal Status: Active ?Ulcer/skin breakdown will have a volume reduction of 80% by week 12 ?Date Initiated: 11/01/2021 ?Target Resolution Date: 01/01/2022 ?Goal Status: Active ?Ulcer/skin breakdown will heal within 14 weeks ?Date Initiated: 11/01/2021 ?Target Resolution Date: 01/31/2022 ?Goal Status: Active ?Interventions: ?Assess patient/caregiver ability to obtain necessary  supplies ?Assess patient/caregiver ability to perform ulcer/skin care regimen upon admission and as needed ?Assess ulceration(s) every visit ?Notes: ?Electronic Signature(s) ?Signed: 11/29/2021 2:56:37 PM By:

## 2021-11-30 ENCOUNTER — Telehealth: Payer: Self-pay

## 2021-11-30 NOTE — Telephone Encounter (Signed)
-----   Message from Ralene Bathe, MD sent at 11/26/2021  6:28 PM EDT ----- ?Diagnosis ?Skin , right medial ankle ?WELL DIFFERENTIATED SQUAMOUS CELL CARCINOMA WITH SUPERFICIAL INFILTRATION, CLOSE TO MARGIN ? ?Cancer - SCC ?Already treated ?Recheck next visit ?

## 2021-11-30 NOTE — Telephone Encounter (Signed)
Left voicemail to return my call

## 2021-12-06 ENCOUNTER — Encounter (HOSPITAL_BASED_OUTPATIENT_CLINIC_OR_DEPARTMENT_OTHER): Payer: PPO | Admitting: Internal Medicine

## 2021-12-06 DIAGNOSIS — L97818 Non-pressure chronic ulcer of other part of right lower leg with other specified severity: Secondary | ICD-10-CM | POA: Diagnosis not present

## 2021-12-06 DIAGNOSIS — T8131XD Disruption of external operation (surgical) wound, not elsewhere classified, subsequent encounter: Secondary | ICD-10-CM | POA: Diagnosis not present

## 2021-12-06 NOTE — Progress Notes (Signed)
[name] ([id number]) ?Visit Report for 12/06/2021 ?Chief Complaint Document Details ?Patient Name: [name], [name] ?Date of Service: 12/06/2021 8:30 AM ?Medical Record Number: [id number] ?Patient Account Number: [id number] ?Date of Birth/Sex: 07-19-1946 (76 y.o. F) ?Treating RN: Donnamarie Poag ?Primary Care Provider: Harold Barban Other Clinician: ?Referring Provider: Harold Barban ?Treating Provider/Extender: Kalman Shan ?Weeks in Treatment: 5 ?Information Obtained from: Patient ?Chief Complaint ?11/01/2021; patient returns to clinic with 2 wounds on the right anterior lower leg. These are secondary to surgical extractions of squamous cell ?carcinomas ?Electronic Signature(s) ?Signed: 12/06/2021 10:26:28 AM By: Kalman Shan DO ?Entered By: Kalman Shan on 12/06/2021 10:22:54 ?AMORAH, SEBRING ([id number]) ?-------------------------------------------------------------------------------- ?Debridement Details ?Patient Name: [name] ?Date of Service: 12/06/2021 8:30 AM ?Medical Record Number: [id number] ?Patient Account Number: [id number] ?Date of Birth/Sex: 15-Dec-1945 (76 y.o. F) ?Treating RN: Donnamarie Poag ?Primary Care Provider: Harold Barban Other Clinician: ?Referring Provider: Harold Barban ?Treating Provider/Extender: Kalman Shan ?Weeks in Treatment: 5 ?Debridement Performed for ?Wound #4 Right,Proximal,Medial Lower Leg ?Assessment: ?Performed By: Physician Kalman Shan, MD ?Debridement Type: Debridement ?Level of Consciousness (Pre- ?Awake and Alert ?procedure): ?Pre-procedure Verification/Time Out ?Yes - 08:56 ?Taken: ?Start Time: 08:57 ?Pain Control: Lidocaine ?Total Area Debrided (L x W): 0.7 (cm) x 0.8 (cm) = 0.56 (cm?) ?Tissue and other material ?Viable, Non-Viable, [place], Subcutaneous, [place] ?debrided: ?Level: Skin/Subcutaneous Tissue ?Debridement Description: Excisional ?Instrument: Curette ?Bleeding: Minimum ?Hemostasis Achieved: Pressure ?Response to Treatment:  Procedure was tolerated well ?Level of Consciousness (Post- ?Awake and Alert ?procedure): ?Post Debridement Measurements of Total Wound ?Length: (cm) 0.7 ?Width: (cm) 0.8 ?Depth: (cm) 0.1 ?Volume: (cm?) 0.044 ?Character of Wound/Ulcer Post Debridement: Improved ?Post Procedure Diagnosis ?Same as Pre-procedure ?Electronic Signature(s) ?Signed: 12/06/2021 10:26:28 AM By: Kalman Shan DO ?Signed: 12/06/2021 1:53:21 PM By: Donnamarie Poag ?Entered ByDonnamarie Poag on 12/06/2021 08:58:40 ?ANTANETTE, RICHWINE ([id number]) ?-------------------------------------------------------------------------------- ?Debridement Details ?Patient Name: [name] ?Date of Service: 12/06/2021 8:30 AM ?Medical Record Number: [id number] ?Patient Account Number: [id number] ?Date of Birth/Sex: 1946-06-18 (76 y.o. F) ?Treating RN: Donnamarie Poag ?Primary Care Provider: Harold Barban Other Clinician: ?Referring Provider: Harold Barban ?Treating Provider/Extender: Kalman Shan ?Weeks in Treatment: 5 ?Debridement Performed for ?Wound #5 Right,Distal,Anterior Lower Leg ?Assessment: ?Performed By: Physician Kalman Shan, MD ?Debridement Type: Debridement ?Level of Consciousness (Pre- ?Awake and Alert ?procedure): ?Pre-procedure Verification/Time Out ?Yes - 08:56 ?Taken: ?Start Time: 08:59 ?Pain Control: Lidocaine ?Total Area Debrided (L x W): 0.8 (cm) x 0.6 (cm) = 0.48 (cm?) ?Tissue and other material ?Viable, Non-Viable, [place], Subcutaneous, [place] ?debrided: ?Level: Skin/Subcutaneous Tissue ?Debridement Description: Excisional ?Instrument: Curette ?Bleeding: Minimum ?Hemostasis Achieved: Pressure ?Response to Treatment: Procedure was tolerated well ?Level of Consciousness (Post- ?Awake and Alert ?procedure): ?Post Debridement Measurements of Total Wound ?Length: (cm) 0.8 ?Width: (cm) 0.6 ?Depth: (cm) 0.1 ?Volume: (cm?) 0.038 ?Character of Wound/Ulcer Post Debridement: Improved ?Post Procedure Diagnosis ?Same as Pre-procedure ?Electronic  Signature(s) ?Signed: 12/06/2021 10:26:28 AM By: Kalman Shan DO ?Signed: 12/06/2021 1:53:21 PM By: Donnamarie Poag ?Entered ByDonnamarie Poag on 12/06/2021 08:59:08 ?SHANEQUIA, KENDRICK ([id number]) ?-------------------------------------------------------------------------------- ?Debridement Details ?Patient Name: [name] ?Date of Service: 12/06/2021 8:30 AM ?Medical Record Number: [id number] ?Patient Account Number: [id number] ?Date of Birth/Sex: 1946-04-19 (76 y.o. F) ?Treating RN: Donnamarie Poag ?Primary Care Provider: Harold Barban Other Clinician: ?Referring Provider: Harold Barban ?Treating Provider/Extender: Kalman Shan ?Weeks in Treatment: 5 ?Debridement Performed for ?Wound #6 Right,Medial Upper Leg ?Assessment: ?Performed By: Physician Kalman Shan, MD ?Debridement Type: Debridement ?Level of Consciousness (Pre- ?Awake and Alert ?procedure): ?Pre-procedure Verification/Time Out ?Yes - 08:56 ?  Taken: ?Start Time: 09:02 ?Pain Control: Lidocaine ?Total Area Debrided (L x W): 0.6 (cm) x 0.5 (cm) = 0.3 (cm?) ?Tissue and other material ?Viable, Non-Viable, [place], Subcutaneous, [place] ?debrided: ?Level: Skin/Subcutaneous Tissue ?Debridement Description: Excisional ?Instrument: Curette ?Bleeding: Minimum ?Hemostasis Achieved: Pressure ?Response to Treatment: Procedure was tolerated well ?Level of Consciousness (Post- ?Awake and Alert ?procedure): ?Post Debridement Measurements of Total Wound ?Length: (cm) 0.6 ?Width: (cm) 0.5 ?Depth: (cm) 0.1 ?Volume: (cm?) 0.024 ?Character of Wound/Ulcer Post Debridement: Improved ?Post Procedure Diagnosis ?Same as Pre-procedure ?Electronic Signature(s) ?Signed: 12/06/2021 10:26:28 AM By: Kalman Shan DO ?Signed: 12/06/2021 1:53:21 PM By: Donnamarie Poag ?Entered ByDonnamarie Poag on 12/06/2021 09:00:47 ?ISHITHA, ROPER ([id number]) ?-------------------------------------------------------------------------------- ?HPI Details ?Patient Name: [name] ?Date of Service:  12/06/2021 8:30 AM ?Medical Record Number: [id number] ?Patient Account Number: [id number] ?Date of Birth/Sex: August 07, 1945 (76 y.o. F) ?Treating RN: Donnamarie Poag ?Primary Care Provider: Harold Barban Other Clinician: ?Referring Provider: Harold Barban ?Treating Provider/Extender: Kalman Shan ?Weeks in Treatment: 5 ?History of Present Illness ?HPI Description: ADMISSION ?11/02/2020 ?This is a 76 year old woman who spent 3 months in [place] just returning yesterday. On March 7 she was picking oranges for the need he had a ?fall and developed a skin tear on her left lower leg she is also able to show me pictures. She was seen in ER or urgent care. They tried to replace ?the skin with Steri-Strips. She suddenly developed swelling in the area. She went back to the urgent care. She has been using Bactroban and ?Steri-Strips to try and reapplied approximate things ever since. She does have atrial fibrillation on anticoagulants. ?Past medical history includes paroxysmal atrial fibrillation, multiple skin problems followed by Dr. Nehemiah Massed at [facility] including seborrheic ?keratosis melanotic nevi hemangiomas and actinic damage secondary to chronic UV exposure. She also has a history of squamous cell carcinoma ?of the skin and has been treated with radiation to her legs at one point as well as I think phototherapy. She also has hyperlipidemia. ?ABI in our clinic was 1.19 on the left ?11/08/2020 upon evaluation today patient appears to be doing well with regard to her wound on her leg. This is showing signs of good ?improvement which is great news and overall there does not appear to be any signs of active infection at this time which is great news. Obviously ?this is dramatically improved even compared to last week when Dr. Dellia Nims saw her. ?4/27; left anterior leg wound trauma in the setting of chronic venous insufficiency and likely solar skin damage. In general this is better than last ?time I saw this. Healthy  looking granulation. There is a small tunnel from about 11-12 o'clock at 0.4 cm. Using silver algia ?5/4; left anterior lower leg wound looks a lot better. This is gradually closing over. There is some esc

## 2021-12-06 NOTE — Progress Notes (Signed)
[name], [name] ([id number]) ?Visit Report for 12/06/2021 ?Arrival Information Details ?Patient Name: [name], [name] ?Date of Service: 12/06/2021 8:30 AM ?Medical Record Number: [id number] ?Patient Account Number: [id number] ?Date of Birth/Sex: 04/07/1946 (76 y.o. F) ?Treating RN: Donnamarie Poag ?Primary Care : Harold Barban Other Clinician: ?Referring : Harold Barban ?Treating /Extender: Kalman Shan ?Weeks in Treatment: 5 ?Visit Information History Since Last Visit ?Added or deleted any medications: No ?Patient Arrived: Ambulatory ?Had a fall or experienced change in No ?Arrival Time: 08:36 ?activities of daily living that may affect ?Accompanied By: self ?risk of falls: ?Transfer Assistance: None ?Hospitalized since last visit: No ?Patient Identification Verified: Yes ?Has Dressing in Place as Prescribed: Yes ?Secondary Verification Process Completed: Yes ?Has Compression in Place as Prescribed: Yes ?Patient Requires Transmission-Based Precautions: No ?Pain Present Now: No ?Patient Has Alerts: Yes ?Patient Alerts: NOT diabetic ?Electronic Signature(s) ?Signed: 12/06/2021 1:53:21 PM By: Donnamarie Poag ?Entered ByDonnamarie Poag on 12/06/2021 08:42:13 ?[name], [name] ([id number]) ?-------------------------------------------------------------------------------- ?Encounter Discharge Information Details ?Patient Name: [name], [name] ?Date of Service: 12/06/2021 8:30 AM ?Medical Record Number: [id number] ?Patient Account Number: [id number] ?Date of Birth/Sex: 1945/12/05 (76 y.o. F) ?Treating RN: Donnamarie Poag ?Primary Care : Harold Barban Other Clinician: ?Referring : Harold Barban ?Treating /Extender: Kalman Shan ?Weeks in Treatment: 5 ?Encounter Discharge Information Items Post Procedure Vitals ?Discharge Condition: Stable ?Temperature (?F): 98.5 ?Ambulatory Status: Ambulatory ?Pulse (bpm): 56 ?Discharge Destination: Home ?Respiratory Rate (breaths/min):  16 ?Transportation: Private Auto ?Blood Pressure (mmHg): 108/61 ?Accompanied By: self ?Schedule Follow-up Appointment: Yes ?Clinical Summary of Care: ?Electronic Signature(s) ?Signed: 12/06/2021 1:53:21 PM By: Donnamarie Poag ?Entered ByDonnamarie Poag on 12/06/2021 09:05:12 ?SALONI, LABLANC ([id number]) ?-------------------------------------------------------------------------------- ?Lower Extremity Assessment Details ?Patient Name: [name], [name] ?Date of Service: 12/06/2021 8:30 AM ?Medical Record Number: [id number] ?Patient Account Number: [id number] ?Date of Birth/Sex: 27-Nov-1945 (76 y.o. F) ?Treating RN: Donnamarie Poag ?Primary Care : Harold Barban Other Clinician: ?Referring : Harold Barban ?Treating /Extender: Kalman Shan ?Weeks in Treatment: 5 ?Edema Assessment ?Assessed: [Left: No] [Right: Yes] ?Edema: [Left: N] [Right: o] ?Calf ?Left: Right: ?Point of Measurement: 30 cm From Medial Instep 35 cm ?Ankle ?Left: Right: ?Point of Measurement: 8 cm From Medial Instep 20 cm ?Knee To Floor ?Left: Right: ?From Medial Instep 39 cm ?Vascular Assessment ?Pulses: ?Dorsalis Pedis ?Palpable: [Right:Yes] ?Electronic Signature(s) ?Signed: 12/06/2021 1:53:21 PM By: Donnamarie Poag ?Entered ByDonnamarie Poag on 12/06/2021 08:49:13 ?[name], [name] ([id number]) ?-------------------------------------------------------------------------------- ?Multi Wound Chart Details ?Patient Name: [name], [name] ?Date of Service: 12/06/2021 8:30 AM ?Medical Record Number: [id number] ?Patient Account Number: [id number] ?Date of Birth/Sex: [birth year] (76 y.o. F) ?Treating RN: Donnamarie Poag ?Primary Care : Harold Barban Other Clinician: ?Referring : Harold Barban ?Treating /Extender: Kalman Shan ?Weeks in Treatment: 5 ?Vital Signs ?Height(in): 67 ?Pulse(bpm): 56 ?Weight(lbs): 151 ?Blood Pressure(mmHg): 108/61 ?Body Mass Index(BMI): 23.6 ?Temperature(??F): 98.5 ?Respiratory Rate(breaths/min):  16 ?Photos: ?Wound Location: Right, Proximal, Medial Lower Leg Right, Distal, Anterior Lower Leg Right, Medial Upper Leg ?Wounding Event: Gradually Appeared Surgical Injury Surgical Injury ?Primary Etiology: Open Surgical Wound Open Surgical Wound Open Surgical Wound ?Comorbid History: Cataracts, Arrhythmia, Cataracts, Arrhythmia, Cataracts, Arrhythmia, ?Hypertension, Colitis, Received Hypertension, Colitis, Received Hypertension, Colitis, Received ?Chemotherapy Chemotherapy Chemotherapy ?Date Acquired: 06/22/2021 11/23/2021 11/23/2021 ?Weeks of Treatment: _0 ?Wound Status: Open Open Open ?Wound Recurrence: No No No ?Measurements L x W x D (cm) 0.7x0.8x0.1 0.8x0.6x0.1 0.6x0.5x0.1 ?Area (cm?) : 0.44 0.377 0.236 ?Volume (cm?) : 0.044 0.038 0.024 ?% Reduction in Area: 94.80% 46.70% 33.10% ?% Reduction in Volume:  97.40% 46.50% 31.40% ?Classification: Full Thickness Without Exposed Full Thickness Without Exposed Full Thickness Without Exposed ?Support Structures Support Structures Support Structures ?Exudate Amount: Medium Medium Medium ?Exudate Type: Serosanguineous Serosanguineous Serosanguineous ?Exudate Color: red, brown red, brown red, brown ?Granulation Amount: Medium (34-66%) Medium (34-66%) Medium (34-66%) ?Granulation Quality: Red, Pink Red, Pink Red, Pink ?Necrotic Amount: Medium (34-66%) Medium (34-66%) Medium (34-66%) ?Exposed Structures: ?Fat Layer (Subcutaneous Tissue): ?Fat Layer (Subcutaneous Tissue): Fat Layer (Subcutaneous Tissue): ?Yes Yes Yes ?Fascia: No ?Fascia: No ?Fascia: No ?Tendon: No ?Tendon: No ?Tendon: No ?Muscle: No ?Muscle: No ?Muscle: No ?Joint: No ?Joint: No ?Joint: No ?Bone: No ?Bone: No ?Bone: No ?Treatment Notes ?Electronic Signature(s) ?Signed: 12/06/2021 1:53:21 PM By: Donnamarie Poag ?Entered ByDonnamarie Poag on 12/06/2021 08:50:43 ?[name], [name] ([id number]) ?-------------------------------------------------------------------------------- ?Multi-Disciplinary Care Plan Details ?Patient  Name: [name], [name] ?Date of Service: 12/06/2021 8:30 AM ?Medical Record Number: [id number] ?Patient Account Number: [id number] ?Date of Birth/Sex: 02-12-1946 (76 y.o. F) ?Treating RN: Donnamarie Poag ?Primary Care : Harold Barban Other Clinician: ?Referring : Harold Barban ?Treating /Extender: Kalman Shan ?Weeks in Treatment: 5 ?Active Inactive ?Malignancy/Atypical Etiology ?Nursing Diagnoses: ?Knowledge deficit related to disease process and management of atypical ulcer etiology ?Knowledge deficit related to disease process and management of malignancy ?Goals: ?Patient/caregiver will verbalize understanding of disease process and disease management of atypical ulcer etiology ?Date Initiated: 11/22/2021 ?Date Inactivated: 11/29/2021 ?Target Resolution Date: 11/22/2021 ?Goal Status: Met ?Patient/caregiver will verbalize understanding of disease process and disease management of malignancy ?Date Initiated: 11/22/2021 ?Target Resolution Date: 11/22/2021 ?Goal Status: Active ?Interventions: ?Assess patient and family medical history for signs and symptoms of malignancy/atypical etiology upon admission ?Provide education on atypical ulcer etiologies ?Provide education on malignant ulcerations ?Notes: ?Wound/Skin Impairment ?Nursing Diagnoses: ?Knowledge deficit related to ulceration/compromised skin integrity ?Goals: ?Ulcer/skin breakdown will have a volume reduction of 30% by week 4 ?Date Initiated: 11/01/2021 ?Target Resolution Date: 12/01/2021 ?Goal Status: Active ?Ulcer/skin breakdown will have a volume reduction of 50% by week 8 ?Date Initiated: 11/01/2021 ?Target Resolution Date: 12/01/2021 ?Goal Status: Active ?Ulcer/skin breakdown will have a volume reduction of 80% by week 12 ?Date Initiated: 11/01/2021 ?Target Resolution Date: 01/01/2022 ?Goal Status: Active ?Ulcer/skin breakdown will heal within 14 weeks ?Date Initiated: 11/01/2021 ?Target Resolution Date: 01/31/2022 ?Goal Status:  Active ?Interventions: ?Assess patient/caregiver ability to obtain necessary supplies ?Assess patient/caregiver ability to perform ulcer/skin care regimen upon admission and as needed ?Assess ulceration(s) every visit ?Notes: ?Ele

## 2021-12-07 NOTE — Telephone Encounter (Signed)
Advised pt of bx results/sh ?

## 2021-12-07 NOTE — Telephone Encounter (Signed)
-----   Message from Ralene Bathe, MD sent at 11/26/2021  6:28 PM EDT ----- Diagnosis Skin , right medial ankle WELL DIFFERENTIATED SQUAMOUS CELL CARCINOMA WITH SUPERFICIAL INFILTRATION, CLOSE TO MARGIN  Cancer - SCC Already treated Recheck next visit

## 2021-12-08 ENCOUNTER — Encounter: Payer: Self-pay | Admitting: Dermatology

## 2021-12-08 NOTE — Progress Notes (Deleted)
NO SHOW

## 2021-12-10 ENCOUNTER — Other Ambulatory Visit: Payer: Self-pay | Admitting: Cardiovascular Disease

## 2021-12-11 ENCOUNTER — Ambulatory Visit: Payer: PPO | Admitting: Cardiovascular Disease

## 2021-12-11 DIAGNOSIS — Z7901 Long term (current) use of anticoagulants: Secondary | ICD-10-CM

## 2021-12-11 DIAGNOSIS — I480 Paroxysmal atrial fibrillation: Secondary | ICD-10-CM

## 2021-12-11 DIAGNOSIS — E785 Hyperlipidemia, unspecified: Secondary | ICD-10-CM

## 2021-12-11 DIAGNOSIS — I340 Nonrheumatic mitral (valve) insufficiency: Secondary | ICD-10-CM

## 2021-12-11 DIAGNOSIS — I2510 Atherosclerotic heart disease of native coronary artery without angina pectoris: Secondary | ICD-10-CM

## 2021-12-12 ENCOUNTER — Encounter: Payer: Self-pay | Admitting: Cardiovascular Disease

## 2021-12-13 ENCOUNTER — Ambulatory Visit: Payer: PPO | Admitting: Internal Medicine

## 2021-12-19 ENCOUNTER — Telehealth: Payer: Self-pay | Admitting: Cardiovascular Disease

## 2021-12-19 NOTE — Telephone Encounter (Signed)
*  STAT* If patient is at the pharmacy, call can be transferred to refill team.   1. Which medications need to be refilled? (please list name of each medication and dose if known)   rosuvastatin (CRESTOR) 5 MG tablet TAKE 1 TABLET BY MOUTH EVERY DAY      2. Which pharmacy/location (including street and city if local pharmacy) is medication to be sent to? [place], [place]  3. Do they need a 30 day or 90 day supply?  90 day

## 2021-12-19 NOTE — Telephone Encounter (Signed)
30 day refill given on 12/11/21 with instructions for no further refills prior to appt, and pt no show for appt with Dr. Rockey Situ 12/11/21.  Pt cancelled appt scheduled for 12/22/21 with Laurann Montana, NP on 12/19/21.   Pt with multiple no show/cancellations. Rx not approved.

## 2021-12-20 ENCOUNTER — Encounter (HOSPITAL_BASED_OUTPATIENT_CLINIC_OR_DEPARTMENT_OTHER): Payer: PPO | Admitting: Internal Medicine

## 2021-12-20 DIAGNOSIS — L97818 Non-pressure chronic ulcer of other part of right lower leg with other specified severity: Secondary | ICD-10-CM

## 2021-12-20 DIAGNOSIS — T8131XD Disruption of external operation (surgical) wound, not elsewhere classified, subsequent encounter: Secondary | ICD-10-CM

## 2021-12-20 DIAGNOSIS — C44722 Squamous cell carcinoma of skin of right lower limb, including hip: Secondary | ICD-10-CM

## 2021-12-20 NOTE — Progress Notes (Signed)
[name], [name] ([id number]) Visit Report for 12/20/2021 Chief Complaint Document Details Patient Name: [name], [name]. Date of Service: 12/20/2021 9:45 AM Medical Record Number: [id number] Patient Account Number: [id number] Date of Birth/Sex: 1945-10-25 (76 y.o. F) Treating RN: Alycia Rossetti Primary Care Provider: Harold Barban Other Clinician: Referring Provider: Harold Barban Treating Provider/Extender: Yaakov Guthrie in Treatment: 7 Information Obtained from: Patient Chief Complaint 11/01/2021; patient returns to clinic with 2 wounds on the right anterior lower leg. These are secondary to surgical extractions of squamous cell carcinomas Electronic Signature(s) Signed: 12/20/2021 11:38:35 AM By: Heather Shan DO Entered By: [name] on 12/20/2021 11:15:41 [name] ([id number]) -------------------------------------------------------------------------------- HPI Details Patient Name: [name] Date of Service: 12/20/2021 9:45 AM Medical Record Number: [id number] Patient Account Number: [id number] Date of Birth/Sex: 1945-09-24 (76 y.o. F) Treating RN: Alycia Rossetti Primary Care Provider: Harold Barban Other Clinician: Referring Provider: Harold Barban Treating Provider/Extender: Yaakov Guthrie in Treatment: 7 History of Present Illness HPI Description: ADMISSION 11/02/2020 This is a 76 year old woman who spent 3 months in [place] just returning yesterday. On March 7 she was picking oranges for the need he had a fall and developed a skin tear on her left lower leg she is also able to show me pictures. She was seen in ER or urgent care. They tried to replace the skin with Steri-Strips. She suddenly developed swelling in the area. She went back to the urgent care. She has been using Bactroban and Steri-Strips to try and reapplied approximate things ever since. She does have atrial fibrillation on anticoagulants. Past medical history  includes paroxysmal atrial fibrillation, multiple skin problems followed by Dr. Nehemiah Massed at [facility] including seborrheic keratosis melanotic nevi hemangiomas and actinic damage secondary to chronic UV exposure. She also has a history of squamous cell carcinoma of the skin and has been treated with radiation to her legs at one point as well as I think phototherapy. She also has hyperlipidemia. ABI in our clinic was 1.19 on the left 11/08/2020 upon evaluation today patient appears to be doing well with regard to her wound on her leg. This is showing signs of good improvement which is great news and overall there does not appear to be any signs of active infection at this time which is great news. Obviously this is dramatically improved even compared to last week when Dr. Dellia Nims saw her. 4/27; left anterior leg wound trauma in the setting of chronic venous insufficiency and likely solar skin damage. In general this is better than last time I saw this. Healthy looking granulation. There is a small tunnel from about 11-12 o'clock at 0.4 cm. Using silver algia 5/4; left anterior lower leg wound looks a lot better. This is gradually closing over. There is some eschar and flaking skin around the edges of this wound. She shows me a small raised area in the anterior midline more towards the ankle. She also tells me she has a history of squamous cell skin cancer. 5/11; her left anterior lower leg wound which was traumatic in the setting of chronic venous insufficiency actually looks a lot better it is just about closed The last time I saw this she had what looked to be a squamous cell carcinoma on the left lower leg also anteriorly. I sent her back to dermatology they remove this it was apparently squamous cell carcinoma as she has had ofttimes before. This is now opened into a wound as well deeper but with a clean wound surface 5/18;  her traumatic wound on the left upper tibia is closed. The area that  there is a squamous cell carcinoma that was removed by dermatology is also almost closed over. She has a follow-up with them sometime late next week. I am hopeful we will be able to heal her out from the wound point of view before then. 5/25; her traumatic wound on the upper tibia is closed. The squamous cell surgery site had slight amount of eschar over this. She is complaining of irritation of her skin probably a result of the compression. She also has a small area just under her original traumatic wound that popped up under compression this week READMISSION 11/01/2021 This is a now [age]-year-old woman that we had in the clinic last year at that time she had a wound on her left lower leg which was an abrasion from a fall and also a secondary wound from a surgical extraction of a squamous cell carcinoma. These areas healed. She tells me that currently she had 2 areas removed by Dr. Nehemiah Massed dermatology at [facility] in December. He felt he fully extracted these. As per her usual she goes to [place] for the winter. Unfortunately the larger area which is more medial developed obvious recurrences these were extracted by dermatology in [place]. She then underwent Mohs surgery and because of recurrent positive margin she had 2 further Mohs surgeries. There was no repair with the wounds set to close by secondary intention. Before she left [place] at the end of March she was prescribed cephalexin but she did not take this because she did not think they were infected [history of C. difficile]. She has been using some form of leftover dressings she had from last yearo Polymen She returns to the [place] area with 2 wounds to the larger 1 a bit superior and medial and the smaller one more central. Both of these have nonviable surfaces. She has chronic venous insufficiency and solar induced skin damage No change in her past medical history. Her ABI was normal on the right 11/15/2021; patient presents  for follow-up. She has been using Hydrofera Blue every couple days. She has no issues or complaints today. She denies signs of infection. 5/3; patient presents for follow-up. She has been using Hydrofera Blue to the wound beds. She has no issues or complaints today. The right lateral lower extremity wound has healed. She denies signs of infection. 5/10; patient presents for follow-up. She saw her dermatologist last week and had 2 more suspicious areas removed. She now has 3 wounds present. She has been using Vaseline and Hydrofera Blue daily to the wound beds. She currently denies signs of infection. 5/17; patient presents for follow-up. She has been using Santyl and Hydrofera Blue to the wound beds without issues. Occasionally she will forget to put the dressing on to her most proximal right leg wound. She currently denies signs of infection. [name], [name] ([id number]) 5/31; patient presents for follow-up. She has been using Santyl and Hydrofera Blue to the wound beds with significant improvement in wound healing. She has no issues or complaints today. She has been using Tubigrip to help keep the dressings in place and add some compression. Electronic Signature(s) Signed: 12/20/2021 11:38:35 AM By: Heather Shan DO Entered By: [name] on 12/20/2021 11:16:17 [name] ([id number]) -------------------------------------------------------------------------------- Physical Exam Details Patient Name: [name] Date of Service: 12/20/2021 9:45 AM Medical Record Number: [id number] Patient Account Number: [id number] Date of Birth/Sex: Jul 31, 1945 (76 y.o. F) Treating RN:  Alycia Rossetti Primary Care Provider: Harold Barban Other Clinician: Referring Provider: Harold Barban Treating Provider/Extender: Yaakov Guthrie in Treatment: 7 Constitutional . Cardiovascular . Psychiatric . Notes Right lower extremity: 2 open wounds remaining. The most distal wound has  epithelialized. She has 2 remaining pinpoint wounds to the upper medial thigh and leg. This has granulation tissue at the opening. No signs of surrounding infection. Surrounding skin is intact. Electronic Signature(s) Signed: 12/20/2021 11:38:35 AM By: Heather Shan DO Entered By: [name] on 12/20/2021 11:17:07 [name] ([id number]) -------------------------------------------------------------------------------- Physician Orders Details Patient Name: [name] Date of Service: 12/20/2021 9:45 AM Medical Record Number: [id number] Patient Account Number: [id number] Date of Birth/Sex: 20-Feb-1946 (76 y.o. F) Treating RN: Alycia Rossetti Primary Care Provider: Harold Barban Other Clinician: Referring Provider: Harold Barban Treating Provider/Extender: Yaakov Guthrie in Treatment: 7 Verbal / Phone Orders: No Diagnosis Coding ICD-10 Coding Code Description T81.31XD Disruption of external operation (surgical) wound, not elsewhere classified, subsequent encounter L97.818 Non-pressure chronic ulcer of other part of right lower leg with other specified severity C44.722 Squamous cell carcinoma of skin of right lower limb, including hip Wound Treatment Electronic Signature(s) Signed: 12/20/2021 11:38:35 AM By: Heather Shan DO Entered By: [name] on 12/20/2021 11:18:19 [name] ([id number]) -------------------------------------------------------------------------------- Problem List Details Patient Name: [name] Date of Service: 12/20/2021 9:45 AM Medical Record Number: [id number] Patient Account Number: [id number] Date of Birth/Sex: 03/31/1946 (76 y.o. F) Treating RN: Alycia Rossetti Primary Care Provider: Harold Barban Other Clinician: Referring Provider: Harold Barban Treating Provider/Extender: Yaakov Guthrie in Treatment: 7 Active Problems ICD-10 Encounter Code Description Active Date MDM Diagnosis T81.31XD  Disruption of external operation (surgical) wound, not elsewhere 11/01/2021 No Yes classified, subsequent encounter L97.818 Non-pressure chronic ulcer of other part of right lower leg with other 11/01/2021 No Yes specified severity C44.722 Squamous cell carcinoma of skin of right lower limb, including hip 11/01/2021 No Yes Inactive Problems Resolved Problems Electronic Signature(s) Signed: 12/20/2021 11:38:35 AM By: Heather Shan DO Entered By: [name] on 12/20/2021 11:15:38 [name] ([id number]) -------------------------------------------------------------------------------- Progress Note Details Patient Name: [name] Date of Service: 12/20/2021 9:45 AM Medical Record Number: [id number] Patient Account Number: [id number] Date of Birth/Sex: 02-12-1946 (76 y.o. F) Treating RN: Alycia Rossetti Primary Care Provider: Harold Barban Other Clinician: Referring Provider: Harold Barban Treating Provider/Extender: Yaakov Guthrie in Treatment: 7 Subjective Chief Complaint Information obtained from Patient 11/01/2021; patient returns to clinic with 2 wounds on the right anterior lower leg. These are secondary to surgical extractions of squamous cell carcinomas History of Present Illness (HPI) ADMISSION 11/02/2020 This is a 76 year old woman who spent 3 months in [place] just returning yesterday. On March 7 she was picking oranges for the need he had a fall and developed a skin tear on her left lower leg she is also able to show me pictures. She was seen in ER or urgent care. They tried to replace the skin with Steri-Strips. She suddenly developed swelling in the area. She went back to the urgent care. She has been using Bactroban and Steri-Strips to try and reapplied approximate things ever since. She does have atrial fibrillation on anticoagulants. Past medical history includes paroxysmal atrial fibrillation, multiple skin problems followed by Dr. Nehemiah Massed at  [facility] including seborrheic keratosis melanotic nevi hemangiomas and actinic damage secondary to chronic UV exposure. She also has a history of squamous cell carcinoma of the skin and has been treated with radiation to her legs at one point  as well as I think phototherapy. She also has hyperlipidemia. ABI in our clinic was 1.19 on the left 11/08/2020 upon evaluation today patient appears to be doing well with regard to her wound on her leg. This is showing signs of good improvement which is great news and overall there does not appear to be any signs of active infection at this time which is great news. Obviously this is dramatically improved even compared to last week when Dr. Dellia Nims saw her. 4/27; left anterior leg wound trauma in the setting of chronic venous insufficiency and likely solar skin damage. In general this is better than last time I saw this. Healthy looking granulation. There is a small tunnel from about 11-12 o'clock at 0.4 cm. Using silver algia 5/4; left anterior lower leg wound looks a lot better. This is gradually closing over. There is some eschar and flaking skin around the edges of this wound. She shows me a small raised area in the anterior midline more towards the ankle. She also tells me she has a history of squamous cell skin cancer. 5/11; her left anterior lower leg wound which was traumatic in the setting of chronic venous insufficiency actually looks a lot better it is just about closed The last time I saw this she had what looked to be a squamous cell carcinoma on the left lower leg also anteriorly. I sent her back to dermatology they remove this it was apparently squamous cell carcinoma as she has had ofttimes before. This is now opened into a wound as well deeper but with a clean wound surface 5/18; her traumatic wound on the left upper tibia is closed. The area that there is a squamous cell carcinoma that was removed by dermatology is also almost closed  over. She has a follow-up with them sometime late next week. I am hopeful we will be able to heal her out from the wound point of view before then. 5/25; her traumatic wound on the upper tibia is closed. The squamous cell surgery site had slight amount of eschar over this. She is complaining of irritation of her skin probably a result of the compression. She also has a small area just under her original traumatic wound that popped up under compression this week READMISSION 11/01/2021 This is a now [age]-year-old woman that we had in the clinic last year at that time she had a wound on her left lower leg which was an abrasion from a fall and also a secondary wound from a surgical extraction of a squamous cell carcinoma. These areas healed. She tells me that currently she had 2 areas removed by Dr. Nehemiah Massed dermatology at [facility] in December. He felt he fully extracted these. As per her usual she goes to [place] for the winter. Unfortunately the larger area which is more medial developed obvious recurrences these were extracted by dermatology in [place]. She then underwent Mohs surgery and because of recurrent positive margin she had 2 further Mohs surgeries. There was no repair with the wounds set to close by secondary intention. Before she left [place] at the end of March she was prescribed cephalexin but she did not take this because she did not think they were infected [history of C. difficile]. She has been using some form of leftover dressings she had from last yearo [place]ym[place] She returns to the [place] area with 2 wounds to the larger 1 a bit superior and medial and the smaller one more central. Both of these have nonviable  surfaces. She has chronic venous insufficiency and solar induced skin damage No change in her past medical history. Her ABI was normal on the right 11/15/2021; patient presents for follow-up. She has been using Hydrofera Blue every couple days. She has no issues or  complaints today. She denies signs of infection. 5/3; patient presents for follow-up. She has been using Hydrofera Blue to the wound beds. She has no issues or complaints today. The right lateral lower extremity wound has healed. She denies signs of infection. 5/10; patient presents for follow-up. She saw her dermatologist last week and had 2 more suspicious areas removed. She now has 3 wounds [name], [name]. ([id number]) present. She has been using Vaseline and Hydrofera Blue daily to the wound beds. She currently denies signs of infection. 5/17; patient presents for follow-up. She has been using Santyl and Hydrofera Blue to the wound beds without issues. Occasionally she will forget to put the dressing on to her most proximal right leg wound. She currently denies signs of infection. 5/31; patient presents for follow-up. She has been using Santyl and Hydrofera Blue to the wound beds with significant improvement in wound healing. She has no issues or complaints today. She has been using Tubigrip to help keep the dressings in place and add some compression. Objective Constitutional Vitals Time Taken: 10:44 AM, Height: 67 in, Weight: 151 lbs, BMI: 23.6, Temperature: 97.9 F, Pulse: 75 bpm, Respiratory Rate: 16 breaths/min, Blood Pressure: 113/69 mmHg. General Notes: Right lower extremity: 2 open wounds remaining. The most distal wound has epithelialized. She has 2 remaining pinpoint wounds to the upper medial thigh and leg. This has granulation tissue at the opening. No signs of surrounding infection. Surrounding skin is intact. Integumentary (Hair, Skin) Wound #4 status is Open. Original cause of wound was Gradually Appeared. The date acquired was: 06/22/2021. The wound has been in treatment 7 weeks. The wound is located on the Right,Proximal,Medial Lower Leg. The wound measures 0.1cm length x 0.1cm width x 0.1cm depth; 0.008cm^2 area and 0.001cm^3 volume. There is Fat Layer (Subcutaneous Tissue)  exposed. There is a none present amount of drainage noted. There is small (1-33%) pink granulation within the wound bed. There is no necrotic tissue within the wound bed. Wound #5 status is Open. Original cause of wound was Surgical Injury. The date acquired was: 11/23/2021. The wound has been in treatment 3 weeks. The wound is located on the Right,Distal,Anterior Lower Leg. The wound measures 0.1cm length x 0.1cm width x 0.1cm depth; 0.008cm^2 area and 0.001cm^3 volume. There is Fat Layer (Subcutaneous Tissue) exposed. There is no tunneling or undermining noted. There is a none present amount of drainage noted. There is medium (34-66%) red, pink granulation within the wound bed. There is a medium (34-66%) amount of necrotic tissue within the wound bed. Wound #6 status is Open. Original cause of wound was Surgical Injury. The date acquired was: 11/23/2021. The wound has been in treatment 3 weeks. The wound is located on the Right,Medial Upper Leg. The wound measures 0.1cm length x 0.1cm width x 0.1cm depth; 0.008cm^2 area and 0.001cm^3 volume. There is Fat Layer (Subcutaneous Tissue) exposed. There is no tunneling or undermining noted. There is a none present amount of drainage noted. There is medium (34-66%) red, pink granulation within the wound bed. There is a medium (34-66%) amount of necrotic tissue within the wound bed including Adherent Slough. Assessment Active Problems ICD-10 Disruption of external operation (surgical) wound, not elsewhere classified, subsequent encounter Non-pressure chronic ulcer of other  part of right lower leg with other specified severity Squamous cell carcinoma of skin of right lower limb, including hip Patient's wounds have shown significant improvement in size and appearance since last clinic visit. She has 2 remaining open areas that appear well-healing. At this time she does not need a dressing but would benefit from bacitracin ointment daily. She can continue with  the Tubigrip. Follow-up in 2 weeks and I am hopeful that the wounds will be healed by then. Plan 1. Bacitracin ointment 2. Follow-up in 2 weeks [name], [name] ([id number]) Electronic Signature(s) Signed: 12/20/2021 11:38:35 AM By: Heather Shan DO Entered By: [name] on 12/20/2021 11:17:59 [name] ([id number]) -------------------------------------------------------------------------------- SuperBill Details Patient Name: [name] Date of Service: 12/20/2021 Medical Record Number: [id number] Patient Account Number: [id number] Date of Birth/Sex: May 03, 1946 (76 y.o. F) Treating RN: Alycia Rossetti Primary Care Provider: Harold Barban Other Clinician: Referring Provider: Harold Barban Treating Provider/Extender: Yaakov Guthrie in Treatment: 7 Diagnosis Coding ICD-10 Codes Code Description T81.31XD Disruption of external operation (surgical) wound, not elsewhere classified, subsequent encounter L97.818 Non-pressure chronic ulcer of other part of right lower leg with other specified severity C44.722 Squamous cell carcinoma of skin of right lower limb, including hip Facility Procedures CPT4 Code: [id number] Description: 99213 - WOUND CARE VISIT-LEV 3 EST PT Modifier: Quantity: 1 Physician Procedures CPT4 Code Description: [id number] 99213 - WC PHYS LEVEL 3 - EST PT Modifier: Quantity: 1 CPT4 Code Description: ICD-10 Diagnosis Description T81.31XD Disruption of external operation (surgical) wound, not elsewhere classified, L97.818 Non-pressure chronic ulcer of other part of right lower leg with other speci C44.722 Squamous cell carcinoma  of skin of right lower limb, including hip Modifier: subsequent enco fied severity Quantity: Personal assistant) Signed: 12/20/2021 11:38:35 AM By: Heather Shan DO Entered By: Massie Kluver on 12/20/2021 11:24:12

## 2021-12-20 NOTE — Progress Notes (Signed)
SELA, FALK ([id number]) Visit Report for 12/20/2021 Arrival Information Details Patient Name: [name], [name]. Date of Service: 12/20/2021 9:45 AM Medical Record Number: [id number] Patient Account Number: [id number] Date of Birth/Sex: 05-09-1946 (76 y.o. F) Treating RN: Alycia Rossetti Primary Care : Harold Barban Other Clinician: Massie Kluver Referring : Harold Barban Treating /Extender: Yaakov Guthrie in Treatment: 7 Visit Information History Since Last Visit Added or deleted any medications: No Patient Arrived: Ambulatory Any new allergies or adverse reactions: No Arrival Time: 10:37 Had a fall or experienced change in No Transfer Assistance: None activities of daily living that may affect Patient Requires Transmission-Based Precautions: No risk of falls: Patient Has Alerts: Yes Hospitalized since last visit: No Patient Alerts: NOT diabetic Pain Present Now: No Electronic Signature(s) Signed: 12/20/2021 4:31:03 PM By: Massie Kluver Entered By: Massie Kluver on 12/20/2021 10:44:38 [name] ([id number]) -------------------------------------------------------------------------------- Clinic Level of Care Assessment Details Patient Name: [name] Date of Service: 12/20/2021 9:45 AM Medical Record Number: [id number] Patient Account Number: [id number] Date of Birth/Sex: Aug 10, 1945 (76 y.o. F) Treating RN: Alycia Rossetti Primary Care : Harold Barban Other Clinician: Referring : Harold Barban Treating /Extender: Yaakov Guthrie in Treatment: 7 Clinic Level of Care Assessment Items TOOL 4 Quantity Score _0  - Use when only an EandM is performed on FOLLOW-UP visit 0 ASSESSMENTS - Nursing Assessment / Reassessment X - Reassessment of Co-morbidities (includes updates in patient status) 1 10 X- 1 5 Reassessment of Adherence to Treatment Plan ASSESSMENTS - Wound and Skin Assessment /  Reassessment _1  - Simple Wound Assessment / Reassessment - one wound 0 X- 2 5 Complex Wound Assessment / Reassessment - multiple wounds _2  - 0 Dermatologic / Skin Assessment (not related to wound area) ASSESSMENTS - Focused Assessment _3  - Circumferential Edema Measurements - multi extremities 0 _4  - 0 Nutritional Assessment / Counseling / Intervention _5  - 0 Lower Extremity Assessment (monofilament, tuning fork, pulses) _6  - 0 Peripheral Arterial Disease Assessment (using hand held doppler) ASSESSMENTS - Ostomy and/or Continence Assessment and Care _7  - Incontinence Assessment and Management 0 _8  - 0 Ostomy Care Assessment and Management (repouching, etc.) PROCESS - Coordination of Care X - Simple Patient / Family Education for ongoing care 1 15 _9  - 0 Complex (extensive) Patient / Family Education for ongoing care X- 1 10 Staff obtains Programmer, systems, Records, Test Results / Process Orders _10  - 0 Staff telephones HHA, Nursing Homes / Clarify orders / etc _11  - 0 Routine Transfer to another Facility (non-emergent condition) _12  - 0 Routine Hospital Admission (non-emergent condition) _13  - 0 New Admissions / Biomedical engineer / Ordering NPWT, Apligraf, etc. _14  - 0 Emergency Hospital Admission (emergent condition) X- 1 10 Simple Discharge Coordination _15  - 0 Complex (extensive) Discharge Coordination PROCESS - Special Needs _16  - Pediatric / Minor Patient Management 0 _17  - 0 Isolation Patient Management _18  - 0 Hearing / Language / Visual special needs _19  - 0 Assessment of Community assistance (transportation, D/C planning, etc.) _20  - 0 Additional assistance / Altered mentation _21  - 0 Support Surface(s) Assessment (bed, cushion, seat, etc.) INTERVENTIONS - Wound Cleansing / Measurement [name], [name]. ([id number]) _22  - 0 Simple Wound Cleansing - one wound X- 2 5 Complex Wound Cleansing - multiple wounds X- 1 5 Wound Imaging (photographs - any number of wounds) _23  -  0 Wound Tracing (instead of photographs) _24  - 0 Simple Wound Measurement - one wound X- 2 5 Complex Wound Measurement - multiple wounds INTERVENTIONS - Wound Dressings  X - Small Wound Dressing one or multiple wounds 1 10 X- 1 15 Medium Wound Dressing one or multiple wounds _0  - 0 Large Wound Dressing one or multiple wounds <RCVELFYBOFBPZWCH>_8<\/NIDPOEUMPNTIRWER>_1  - 0 Application of Medications - topical <VQMGQQPYPPJKDTOI>_7<\/TIWPYKDXIPJASNKN>_3  - 0 Application of Medications - injection INTERVENTIONS - Miscellaneous _3  - External ear exam 0 _4  - 0 Specimen Collection (cultures, biopsies, blood, body fluids, etc.) _5  - 0 Specimen(s) / Culture(s) sent or taken to Lab for analysis _6  - 0 Patient Transfer (multiple staff / Harrel Lemon Lift / Similar devices) _7  - 0 Simple Staple / Suture removal (25 or less) _8  - 0 Complex Staple / Suture removal (26 or more) _9  - 0 Hypo / Hyperglycemic Management (close monitor of Blood Glucose) _10  - 0 Ankle / Brachial Index (ABI) - do not check if billed separately X- 1 5 Vital Signs Has the patient been seen at the hospital within the last three years: Yes Total Score: 115 Level Of Care: New/Established - Level 3 Electronic Signature(s) Signed: 12/20/2021 4:31:03 PM By: Massie Kluver Entered By: Massie Kluver on 12/20/2021 11:23:56 [name] ([id number]) -------------------------------------------------------------------------------- Encounter Discharge Information Details Patient Name: [name] Date of Service: 12/20/2021 9:45 AM Medical Record Number: [id number] Patient Account Number: [id number] Date of Birth/Sex: 01-07-1946 (76 y.o. F) Treating RN: Alycia Rossetti Primary Care : Harold Barban Other Clinician: Massie Kluver Referring : Harold Barban Treating /Extender: Yaakov Guthrie in Treatment: 7 Encounter Discharge Information Items Discharge Condition: Stable Ambulatory Status: Ambulatory Discharge Destination: Home Transportation: Private  Auto Accompanied By: daughter Schedule Follow-up Appointment: Yes Clinical Summary of Care: Electronic Signature(s) Signed: 12/20/2021 4:31:03 PM By: Massie Kluver Entered By: Massie Kluver on 12/20/2021 14:58:04 [name] ([id number]) -------------------------------------------------------------------------------- Lower Extremity Assessment Details Patient Name: [name] Date of Service: 12/20/2021 9:45 AM Medical Record Number: [id number] Patient Account Number: [id number] Date of Birth/Sex: 30-Oct-1945 (76 y.o. F) Treating RN: Alycia Rossetti Primary Care : Harold Barban Other Clinician: Massie Kluver Referring : Harold Barban Treating /Extender: Yaakov Guthrie in Treatment: 7 Edema Assessment Assessed: [Left: No] [Right: No] Edema: [Left: N] [Right: o] Vascular Assessment Pulses: Dorsalis Pedis Palpable: [Right:Yes] Electronic Signature(s) Signed: 12/20/2021 4:31:03 PM By: Massie Kluver Signed: 12/20/2021 4:45:49 PM By: Alycia Rossetti Entered By: Massie Kluver on 12/20/2021 10:57:19 [name] ([id number]) -------------------------------------------------------------------------------- Multi Wound Chart Details Patient Name: [name] Date of Service: 12/20/2021 9:45 AM Medical Record Number: [id number] Patient Account Number: [id number] Date of Birth/Sex: September 07, 1945 (76 y.o. F) Treating RN: Alycia Rossetti Primary Care : Harold Barban Other Clinician: Referring : Harold Barban Treating /Extender: Yaakov Guthrie in Treatment: 7 Vital Signs Height(in): 56 Pulse(bpm): 68 Weight(lbs): 151 Blood Pressure(mmHg): 113/69 Body Mass Index(BMI): 23.6 Temperature(F): 97.9 Respiratory Rate(breaths/min): 16 Photos: Wound Location: Right, Proximal, Medial Lower Leg Right, Distal, Anterior Lower Leg Right, Medial Upper Leg Wounding Event: Gradually Appeared Surgical Injury Surgical  Injury Primary Etiology: Open Surgical Wound Open Surgical Wound Open Surgical Wound Comorbid History: Cataracts, Arrhythmia, Cataracts, Arrhythmia, Cataracts, Arrhythmia, Hypertension, Colitis, Received Hypertension, Colitis, Received Hypertension, Colitis, Received Chemotherapy Chemotherapy Chemotherapy Date Acquired: 06/22/2021 11/23/2021 11/23/2021 Weeks of Treatment: _11 Wound Status: Open Open Open Wound Recurrence: No No No Measurements L x W x D (cm) 0.1x0.1x0.1 0.1x0.1x0.1 0.1x0.1x0.1 Area (cm) : 0.008 0.008 0.008 Volume (cm) : 0.001 0.001 0.001 % Reduction in Area: 99.90% 98.90% 97.70% % Reduction in Volume: 99.90% 98.60% 97.10% Classification: Full Thickness Without Exposed Full Thickness Without Exposed Full Thickness Without Exposed Support Structures  Support Structures Support Structures Exudate Amount: None Present None Present None Present Granulation Amount: Small (1-33%) Medium (34-66%) Medium (34-66%) Granulation Quality: Pink Red, Pink Red, Pink Necrotic Amount: None Present (0%) Medium (34-66%) Medium (34-66%) Exposed Structures: Fat Layer (Subcutaneous Tissue): Fat Layer (Subcutaneous Tissue): Fat Layer (Subcutaneous Tissue): Yes Yes Yes Fascia: No Fascia: No Fascia: No Tendon: No Tendon: No Tendon: No Muscle: No Muscle: No Muscle: No Joint: No Joint: No Joint: No Bone: No Bone: No Bone: No Epithelialization: N/A Medium (34-66%) Medium (34-66%) Treatment Notes Electronic Signature(s) Signed: 12/20/2021 4:31:03 PM By: Massie Kluver Entered By: Massie Kluver on 12/20/2021 11:08:28 [name] ([id number]) -------------------------------------------------------------------------------- [place] Plan Details Patient Name: [name] Date of Service: 12/20/2021 9:45 AM Medical Record Number: [id number] Patient Account Number: [id number] Date of Birth/Sex: Mar 14, 1946 (76 y.o. F) Treating RN: Alycia Rossetti Primary Care :  Harold Barban Other Clinician: Referring : Harold Barban Treating /Extender: Yaakov Guthrie in Treatment: 7 Active Inactive Malignancy/Atypical Etiology Nursing Diagnoses: Knowledge deficit related to disease process and management of atypical ulcer etiology Knowledge deficit related to disease process and management of malignancy Goals: Patient/caregiver will verbalize understanding of disease process and disease management of atypical ulcer etiology Date Initiated: 11/22/2021 Date Inactivated: 11/29/2021 Target Resolution Date: 11/22/2021 Goal Status: Met Patient/caregiver will verbalize understanding of disease process and disease management of malignancy Date Initiated: 11/22/2021 Target Resolution Date: 11/22/2021 Goal Status: Active Interventions: Assess patient and family medical history for signs and symptoms of malignancy/atypical etiology upon admission Provide education on atypical ulcer etiologies Provide education on malignant ulcerations Notes: Wound/Skin Impairment Nursing Diagnoses: Knowledge deficit related to ulceration/compromised skin integrity Goals: Ulcer/skin breakdown will have a volume reduction of 30% by week 4 Date Initiated: 11/01/2021 Target Resolution Date: 12/01/2021 Goal Status: Active Ulcer/skin breakdown will have a volume reduction of 50% by week 8 Date Initiated: 11/01/2021 Target Resolution Date: 12/01/2021 Goal Status: Active Ulcer/skin breakdown will have a volume reduction of 80% by week 12 Date Initiated: 11/01/2021 Target Resolution Date: 01/01/2022 Goal Status: Active Ulcer/skin breakdown will heal within 14 weeks Date Initiated: 11/01/2021 Target Resolution Date: 01/31/2022 Goal Status: Active Interventions: Assess patient/caregiver ability to obtain necessary supplies Assess patient/caregiver ability to perform ulcer/skin care regimen upon admission and as needed Assess ulceration(s) every  visit Notes: Electronic Signature(s) Signed: 12/20/2021 4:31:03 PM By: Massie Kluver Signed: 12/20/2021 4:45:49 PM By: Alycia Rossetti Entered By: Massie Kluver on 12/20/2021 11:04:26 [name] ([id number] Clark ([id number]) -------------------------------------------------------------------------------- Pain Assessment Details Patient Name: [name] Date of Service: 12/20/2021 9:45 AM Medical Record Number: [id number] Patient Account Number: [id number] Date of Birth/Sex: 10/19/1945 (76 y.o. F) Treating RN: Alycia Rossetti Primary Care : Harold Barban Other Clinician: Referring : Harold Barban Treating /Extender: Yaakov Guthrie in Treatment: 7 Active Problems Location of Pain Severity and Description of Pain Patient Has Paino No Site Locations Pain Management and Medication Current Pain Management: Electronic Signature(s) Signed: 12/20/2021 4:31:03 PM By: Massie Kluver Signed: 12/20/2021 4:45:49 PM By: Alycia Rossetti Entered By: Massie Kluver on 12/20/2021 10:46:46 [name] ([id number]) -------------------------------------------------------------------------------- Patient/Caregiver Education Details Patient Name: [name] Date of Service: 12/20/2021 9:45 AM Medical Record Number: [id number] Patient Account Number: [id number] Date of Birth/Gender: Nov 04, 1945 (76 y.o. F) Treating RN: Alycia Rossetti Primary Care Physician: Harold Barban Other Clinician: Referring Physician: Harold Barban Treating Physician/Extender: Yaakov Guthrie in Treatment: 7 Education Assessment Education Provided To: Patient Education Topics Provided Infection: Handouts: Infection Prevention and Management Methods: Explain/Verbal Responses: State  content correctly Wound/Skin Impairment: Handouts: Caring for Your Ulcer Methods: Explain/Verbal Responses: State content correctly Electronic Signature(s) Signed: 12/20/2021  4:31:03 PM By: Massie Kluver Entered By: Massie Kluver on 12/20/2021 11:24:42 [name] ([id number]) -------------------------------------------------------------------------------- Wound Assessment Details Patient Name: [name] Date of Service: 12/20/2021 9:45 AM Medical Record Number: [id number] Patient Account Number: [id number] Date of Birth/Sex: August 11, 1945 (76 y.o. F) Treating RN: Alycia Rossetti Primary Care : Harold Barban Other Clinician: Referring : Harold Barban Treating /Extender: Yaakov Guthrie in Treatment: 7 Wound Status Wound Number: 4 Primary Open Surgical Wound Etiology: Wound Location: Right, Proximal, Medial Lower Leg Wound Status: Open Wounding Event: Gradually Appeared Comorbid Cataracts, Arrhythmia, Hypertension, Colitis, Received Date Acquired: 06/22/2021 History: Chemotherapy Weeks Of Treatment: 7 Clustered Wound: No Photos Wound Measurements Length: (cm) 0.1 Width: (cm) 0.1 Depth: (cm) 0.1 Area: (cm) 0.008 Volume: (cm) 0.001 % Reduction in Area: 99.9% % Reduction in Volume: 99.9% Wound Description Classification: Full Thickness Without Exposed Support Structures Exudate Amount: None Present Foul Odor After Cleansing: No Slough/Fibrino No Wound Bed Granulation Amount: Small (1-33%) Exposed Structure Granulation Quality: Pink Fascia Exposed: No Necrotic Amount: None Present (0%) Fat Layer (Subcutaneous Tissue) Exposed: Yes Tendon Exposed: No Muscle Exposed: No Joint Exposed: No Bone Exposed: No Treatment Notes Wound #4 (Lower Leg) Wound Laterality: Right, Medial, Proximal Cleanser Peri-Wound Care Topical Bacitracin Ointment, 1 (oz) tube Discharge Instruction: apply to wounds once daily [name], [name] ([id number]) Primary Dressing Gauze Discharge Instruction: use as directed Secondary Dressing Kerlix 4.5 x 4.1 (in/yd) Discharge Instruction: Apply Kerlix 4.5 x 4.1 (in/yd) as  instructed Secured With Medipore Tape - 34M Medipore H Soft Cloth Surgical Tape, 2x2 (in/yd) Discharge Instruction: tape Kerlix to secure Compression Wrap Compression Stockings Add-Ons Electronic Signature(s) Signed: 12/20/2021 4:31:03 PM By: Massie Kluver Signed: 12/20/2021 4:45:49 PM By: Alycia Rossetti Entered By: Massie Kluver on 12/20/2021 10:54:48 [name] ([id number]) -------------------------------------------------------------------------------- Wound Assessment Details Patient Name: [name] Date of Service: 12/20/2021 9:45 AM Medical Record Number: [id number] Patient Account Number: [id number] Date of Birth/Sex: [birth year] (76 y.o. F) Treating RN: Alycia Rossetti Primary Care : Harold Barban Other Clinician: Referring : Harold Barban Treating /Extender: Yaakov Guthrie in Treatment: 7 Wound Status Wound Number: 5 Primary Open Surgical Wound Etiology: Wound Location: Right, Distal, Anterior Lower Leg Wound Status: Healed - Epithelialized Wounding Event: Surgical Injury Comorbid Cataracts, Arrhythmia, Hypertension, Colitis, Received Date Acquired: 11/23/2021 History: Chemotherapy Weeks Of Treatment: 3 Clustered Wound: No Photos Wound Measurements Length: (cm) 0 Width: (cm) 0 Depth: (cm) 0 Area: (cm) 0 Volume: (cm) 0 % Reduction in Area: 100% % Reduction in Volume: 100% Epithelialization: Medium (34-66%) Tunneling: No Undermining: No Wound Description Classification: Full Thickness Without Exposed Support Structures Exudate Amount: None Present Foul Odor After Cleansing: No Slough/Fibrino Yes Wound Bed Granulation Amount: Medium (34-66%) Exposed Structure Granulation Quality: Red, Pink Fascia Exposed: No Necrotic Amount: Medium (34-66%) Fat Layer (Subcutaneous Tissue) Exposed: Yes Tendon Exposed: No Muscle Exposed: No Joint Exposed: No Bone Exposed: No Treatment Notes Wound #5 (Lower Leg) Wound Laterality:  Right, Anterior, Distal Cleanser Peri-Wound Care Topical Primary Dressing [name], [name] ([id number]) Secondary Dressing Secured With Compression Wrap Compression Stockings Add-Ons Electronic Signature(s) Signed: 12/20/2021 4:31:03 PM By: Massie Kluver Signed: 12/20/2021 4:45:49 PM By: Alycia Rossetti Entered By: Massie Kluver on 12/20/2021 11:21:38 [name] ([id number]) -------------------------------------------------------------------------------- Wound Assessment Details Patient Name: [name] Date of Service: 12/20/2021 9:45 AM Medical Record Number: [id number] Patient Account Number: [id number] Date of Birth/Sex: [birth year] (76  y.o. F) Treating RN: Alycia Rossetti Primary Care : Harold Barban Other Clinician: Referring : Harold Barban Treating /Extender: Yaakov Guthrie in Treatment: 7 Wound Status Wound Number: 6 Primary Open Surgical Wound Etiology: Wound Location: Right, Medial Upper Leg Wound Status: Open Wounding Event: Surgical Injury Comorbid Cataracts, Arrhythmia, Hypertension, Colitis, Received Date Acquired: 11/23/2021 History: Chemotherapy Weeks Of Treatment: 3 Clustered Wound: No Photos Wound Measurements Length: (cm) 0.1 Width: (cm) 0.1 Depth: (cm) 0.1 Area: (cm) 0.008 Volume: (cm) 0.001 % Reduction in Area: 97.7% % Reduction in Volume: 97.1% Epithelialization: Medium (34-66%) Tunneling: No Undermining: No Wound Description Classification: Full Thickness Without Exposed Support Structures Exudate Amount: None Present Foul Odor After Cleansing: No Slough/Fibrino Yes Wound Bed Granulation Amount: Medium (34-66%) Exposed Structure Granulation Quality: Red, Pink Fascia Exposed: No Necrotic Amount: Medium (34-66%) Fat Layer (Subcutaneous Tissue) Exposed: Yes Necrotic Quality: Adherent Slough Tendon Exposed: No Muscle Exposed: No Joint Exposed: No Bone Exposed: No Treatment Notes Wound #6 (Upper  Leg) Wound Laterality: Right, Medial Cleanser Peri-Wound Care Topical Bacitracin Ointment, 1 (oz) tube Discharge Instruction: apply to wounds once daily [name], [name] ([id number]) Primary Dressing Gauze Discharge Instruction: use as directed Secondary Dressing Kerlix 4.5 x 4.1 (in/yd) Discharge Instruction: Apply Kerlix 4.5 x 4.1 (in/yd) as instructed Secured With Medipore Tape - 59M Medipore H Soft Cloth Surgical Tape, 2x2 (in/yd) Discharge Instruction: tape Kerlix to secure Compression Wrap Compression Stockings Add-Ons Electronic Signature(s) Signed: 12/20/2021 4:31:03 PM By: Massie Kluver Signed: 12/20/2021 4:45:49 PM By: Alycia Rossetti Entered By: Massie Kluver on 12/20/2021 10:56:37 [name] ([id number]) -------------------------------------------------------------------------------- Vitals Details Patient Name: [name] Date of Service: 12/20/2021 9:45 AM Medical Record Number: [id number] Patient Account Number: [id number] Date of Birth/Sex: 1945-12-30 (76 y.o. F) Treating RN: Alycia Rossetti Primary Care : Harold Barban Other Clinician: Referring : Harold Barban Treating /Extender: Yaakov Guthrie in Treatment: 7 Vital Signs Time Taken: 10:44 Temperature (F): 97.9 Height (in): 67 Pulse (bpm): 75 Weight (lbs): 151 Respiratory Rate (breaths/min): 16 Body Mass Index (BMI): 23.6 Blood Pressure (mmHg): 113/69 Reference Range: 80 - 120 mg / dl Electronic Signature(s) Signed: 12/20/2021 4:31:03 PM By: Massie Kluver Entered By: Massie Kluver on 12/20/2021 10:46:39

## 2021-12-21 ENCOUNTER — Other Ambulatory Visit: Payer: Self-pay | Admitting: Cardiovascular Disease

## 2021-12-22 ENCOUNTER — Ambulatory Visit (HOSPITAL_BASED_OUTPATIENT_CLINIC_OR_DEPARTMENT_OTHER): Payer: PPO | Admitting: Family

## 2022-01-03 ENCOUNTER — Encounter: Payer: PPO | Attending: Internal Medicine | Admitting: Internal Medicine

## 2022-01-03 DIAGNOSIS — Z09 Encounter for follow-up examination after completed treatment for conditions other than malignant neoplasm: Secondary | ICD-10-CM | POA: Insufficient documentation

## 2022-01-03 DIAGNOSIS — Z85828 Personal history of other malignant neoplasm of skin: Secondary | ICD-10-CM | POA: Insufficient documentation

## 2022-01-03 DIAGNOSIS — C44722 Squamous cell carcinoma of skin of right lower limb, including hip: Secondary | ICD-10-CM

## 2022-01-03 DIAGNOSIS — Z923 Personal history of irradiation: Secondary | ICD-10-CM | POA: Diagnosis not present

## 2022-01-03 DIAGNOSIS — Z872 Personal history of diseases of the skin and subcutaneous tissue: Secondary | ICD-10-CM | POA: Insufficient documentation

## 2022-01-03 DIAGNOSIS — I480 Paroxysmal atrial fibrillation: Secondary | ICD-10-CM | POA: Diagnosis not present

## 2022-01-03 DIAGNOSIS — T8131XD Disruption of external operation (surgical) wound, not elsewhere classified, subsequent encounter: Secondary | ICD-10-CM | POA: Diagnosis not present

## 2022-01-03 DIAGNOSIS — L97818 Non-pressure chronic ulcer of other part of right lower leg with other specified severity: Secondary | ICD-10-CM | POA: Diagnosis not present

## 2022-01-03 DIAGNOSIS — Z7901 Long term (current) use of anticoagulants: Secondary | ICD-10-CM | POA: Diagnosis not present

## 2022-01-03 NOTE — Progress Notes (Signed)
[name] ([id number]) Visit Report for 01/03/2022 Chief Complaint Document Details Patient Name: [name], [name]. Date of Service: 01/03/2022 9:45 AM Medical Record Number: [id number] Patient Account Number: [id number] Date of Birth/Sex: [birth year] (76 y.o. F) Treating RN: [name] Primary Care Provider: Harold Clark Other Clinician: Massie Clark Referring Provider: Harold Clark Treating Provider/Extender: [name] in Treatment: 9 Information Obtained from: Patient Chief Complaint 11/01/2021; patient returns to clinic with 2 wounds on the right anterior lower leg. These are secondary to surgical extractions of squamous cell carcinomas Electronic Signature(s) Signed: 01/03/2022 11:59:07 AM By: Kalman Shan DO Entered By: Kalman Shan on 01/03/2022 10:59:30 [name] ([id number]) -------------------------------------------------------------------------------- HPI Details Patient Name: [name] Date of Service: 01/03/2022 9:45 AM Medical Record Number: [id number] Patient Account Number: [id number] Date of Birth/Sex: September 26, 1945 (76 y.o. F) Treating RN: [name] Primary Care Provider: Harold Clark Other Clinician: Massie Clark Referring Provider: Harold Clark Treating Provider/Extender: [name] in Treatment: 9 History of Present Illness HPI Description: ADMISSION 11/02/2020 This is a 76 year old woman who spent 3 months in [place] just returning yesterday. On March 7 she was picking oranges for the need he had a fall and developed a skin tear on her left lower leg she is also able to show me pictures. She was seen in ER or urgent care. They tried to replace the skin with Steri-Strips. She suddenly developed swelling in the area. She went back to the urgent care. She has been using Bactroban and Steri-Strips to try and reapplied approximate things ever since. She does have atrial fibrillation on  anticoagulants. Past medical history includes paroxysmal atrial fibrillation, multiple skin problems followed by Dr. Nehemiah Massed at [facility] including seborrheic keratosis melanotic nevi hemangiomas and actinic damage secondary to chronic UV exposure. She also has a history of squamous cell carcinoma of the skin and has been treated with radiation to her legs at one point as well as I think phototherapy. She also has hyperlipidemia. ABI in our clinic was 1.19 on the left 11/08/2020 upon evaluation today patient appears to be doing well with regard to her wound on her leg. This is showing signs of good improvement which is great news and overall there does not appear to be any signs of active infection at this time which is great news. Obviously this is dramatically improved even compared to last week when Dr. Dellia Nims saw her. 4/27; left anterior leg wound trauma in the setting of chronic venous insufficiency and likely solar skin damage. In general this is better than last time I saw this. Healthy looking granulation. There is a small tunnel from about 11-12 o'clock at 0.4 cm. Using silver algia 5/4; left anterior lower leg wound looks a lot better. This is gradually closing over. There is some eschar and flaking skin around the edges of this wound. She shows me a small raised area in the anterior midline more towards the ankle. She also tells me she has a history of squamous cell skin cancer. 5/11; her left anterior lower leg wound which was traumatic in the setting of chronic venous insufficiency actually looks a lot better it is just about closed The last time I saw this she had what looked to be a squamous cell carcinoma on the left lower leg also anteriorly. I sent her back to dermatology they remove this it was apparently squamous cell carcinoma as she has had ofttimes before. This is now opened into a wound as well deeper but with a  clean wound surface 5/18; her traumatic wound on the left  upper tibia is closed. The area that there is a squamous cell carcinoma that was removed by dermatology is also almost closed over. She has a follow-up with them sometime late next week. I am hopeful we will be able to heal her out from the wound point of view before then. 5/25; her traumatic wound on the upper tibia is closed. The squamous cell surgery site had slight amount of eschar over this. She is complaining of irritation of her skin probably a result of the compression. She also has a small area just under her original traumatic wound that popped up under compression this week READMISSION 11/01/2021 This is a now [age]-year-old woman that we had in the clinic last year at that time she had a wound on her left lower leg which was an abrasion from a fall and also a secondary wound from a surgical extraction of a squamous cell carcinoma. These areas healed. She tells me that currently she had 2 areas removed by Dr. Nehemiah Massed dermatology at [facility] in December. He felt he fully extracted these. As per her usual she goes to [place] for the winter. Unfortunately the larger area which is more medial developed obvious recurrences these were extracted by dermatology in [place]. She then underwent Mohs surgery and because of recurrent positive margin she had 2 further Mohs surgeries. There was no repair with the wounds set to close by secondary intention. Before she left [place] at the end of March she was prescribed cephalexin but she did not take this because she did not think they were infected [history of C. difficile]. She has been using some form of leftover dressings she had from last yearo Polymen She returns to the [place] area with 2 wounds to the larger 1 a bit superior and medial and the smaller one more central. Both of these have nonviable surfaces. She has chronic venous insufficiency and solar induced skin damage No change in her past medical history. Her ABI was normal on the  right 11/15/2021; patient presents for follow-up. She has been using Hydrofera Blue every couple days. She has no issues or complaints today. She denies signs of infection. 5/3; patient presents for follow-up. She has been using Hydrofera Blue to the wound beds. She has no issues or complaints today. The right lateral lower extremity wound has healed. She denies signs of infection. 5/10; patient presents for follow-up. She saw her dermatologist last week and had 2 more suspicious areas removed. She now has 3 wounds present. She has been using Vaseline and Hydrofera Blue daily to the wound beds. She currently denies signs of infection. 5/17; patient presents for follow-up. She has been using Santyl and Hydrofera Blue to the wound beds without issues. Occasionally she will forget to put the dressing on to her most proximal right leg wound. She currently denies signs of infection. 5/31; patient presents for follow-up. She has been using Santyl and Hydrofera Blue to the wound beds with significant improvement in wound [name], Heather H. ([id number]) healing. She has no issues or complaints today. She has been using Tubigrip to help keep the dressings in place and add some compression. 6/14; patient presents for follow-up. She has been using bacitracin ointment to the wound sites. She states that these have healed up. She has no issues or complaints today. Electronic Signature(s) Signed: 01/03/2022 11:59:07 AM By: Kalman Shan DO Entered By: Kalman Shan on 01/03/2022 11:05:53 [name], [name] (  [id number]) -------------------------------------------------------------------------------- Physical Exam Details Patient Name: [name], [name]. Date of Service: 01/03/2022 9:45 AM Medical Record Number: [id number] Patient Account Number: [id number] Date of Birth/Sex: 1945-12-11 (76 y.o. F) Treating RN: [name] Primary Care Provider: Harold Clark Other Clinician: Massie Clark Referring Provider:  Harold Clark Treating Provider/Extender: [name] in Treatment: 9 Constitutional . Cardiovascular . Psychiatric . Notes Right lower extremity: Epithelization to the previous wound sites. No open wounds present. Electronic Signature(s) Signed: 01/03/2022 11:59:07 AM By: Kalman Shan DO Entered By: Kalman Shan on 01/03/2022 11:06:14 [name] ([id number]) -------------------------------------------------------------------------------- Physician Orders Details Patient Name: [name] Date of Service: 01/03/2022 9:45 AM Medical Record Number: [id number] Patient Account Number: [id number] Date of Birth/Sex: 19-Feb-1946 (76 y.o. F) Treating RN: [name] Primary Care Provider: Harold Clark Other Clinician: Massie Clark Referring Provider: Harold Clark Treating Provider/Extender: [name] in Treatment: 9 Verbal / Phone Orders: No Diagnosis Coding ICD-10 Coding Code Description T81.31XD Disruption of external operation (surgical) wound, not elsewhere classified, subsequent encounter L97.818 Non-pressure chronic ulcer of other part of right lower leg with other specified severity C44.722 Squamous cell carcinoma of skin of right lower limb, including hip Discharge From [place] o Discharge from [place] Treatment Complete - please call clinic if symptoms return Electronic Signature(s) Signed: 01/03/2022 11:59:07 AM By: Kalman Shan DO Signed: 01/03/2022 1:49:35 PM By: [name] Entered By: [name] on 01/03/2022 11:15:06 [name] ([id number]) -------------------------------------------------------------------------------- Problem List Details Patient Name: [name] Date of Service: 01/03/2022 9:45 AM Medical Record Number: [id number] Patient Account Number: [id number] Date of Birth/Sex: May 23, 1946 (76 y.o. F) Treating RN: [name] Primary Care Provider: Harold Clark Other  Clinician: Massie Clark Referring Provider: Harold Clark Treating Provider/Extender: [name] in Treatment: 9 Active Problems ICD-10 Encounter Code Description Active Date MDM Diagnosis T81.31XD Disruption of external operation (surgical) wound, not elsewhere 11/01/2021 No Yes classified, subsequent encounter L97.818 Non-pressure chronic ulcer of other part of right lower leg with other 11/01/2021 No Yes specified severity C44.722 Squamous cell carcinoma of skin of right lower limb, including hip 11/01/2021 No Yes Inactive Problems Resolved Problems Electronic Signature(s) Signed: 01/03/2022 11:59:07 AM By: Kalman Shan DO Entered By: Kalman Shan on 01/03/2022 10:59:22 [name] ([id number]) -------------------------------------------------------------------------------- Progress Note Details Patient Name: [name] Date of Service: 01/03/2022 9:45 AM Medical Record Number: [id number] Patient Account Number: [id number] Date of Birth/Sex: 1946/03/11 (76 y.o. F) Treating RN: [name] Primary Care Provider: Harold Clark Other Clinician: Massie Clark Referring Provider: Harold Clark Treating Provider/Extender: [name] in Treatment: 9 Subjective Chief Complaint Information obtained from Patient 11/01/2021; patient returns to clinic with 2 wounds on the right anterior lower leg. These are secondary to surgical extractions of squamous cell carcinomas History of Present Illness (HPI) ADMISSION 11/02/2020 This is a 76 year old woman who spent 3 months in [place] just returning yesterday. On March 7 she was picking oranges for the need he had a fall and developed a skin tear on her left lower leg she is also able to show me pictures. She was seen in ER or urgent care. They tried to replace the skin with Steri-Strips. She suddenly developed swelling in the area. She went back to the urgent care. She has been using Bactroban  and Steri-Strips to try and reapplied approximate things ever since. She does have atrial fibrillation on anticoagulants. Past medical history includes paroxysmal atrial fibrillation, multiple skin problems followed by Dr. Nehemiah Massed at [place] [facility] including seborrheic  keratosis melanotic nevi hemangiomas and actinic damage secondary to chronic UV exposure. She also has a history of squamous cell carcinoma of the skin and has been treated with radiation to her legs at one point as well as I think phototherapy. She also has hyperlipidemia. ABI in our clinic was 1.19 on the left 11/08/2020 upon evaluation today patient appears to be doing well with regard to her wound on her leg. This is showing signs of good improvement which is great news and overall there does not appear to be any signs of active infection at this time which is great news. Obviously this is dramatically improved even compared to last week when Dr. Dellia Nims saw her. 4/27; left anterior leg wound trauma in the setting of chronic venous insufficiency and likely solar skin damage. In general this is better than last time I saw this. Healthy looking granulation. There is a small tunnel from about 11-12 o'clock at 0.4 cm. Using silver algia 5/4; left anterior lower leg wound looks a lot better. This is gradually closing over. There is some eschar and flaking skin around the edges of this wound. She shows me a small raised area in the anterior midline more towards the ankle. She also tells me she has a history of squamous cell skin cancer. 5/11; her left anterior lower leg wound which was traumatic in the setting of chronic venous insufficiency actually looks a lot better it is just about closed The last time I saw this she had what looked to be a squamous cell carcinoma on the left lower leg also anteriorly. I sent her back to dermatology they remove this it was apparently squamous cell carcinoma as she has had ofttimes before. This is  now opened into a wound as well deeper but with a clean wound surface 5/18; her traumatic wound on the left upper tibia is closed. The area that there is a squamous cell carcinoma that was removed by dermatology is also almost closed over. She has a follow-up with them sometime late next week. I am hopeful we will be able to heal her out from the wound point of view before then. 5/25; her traumatic wound on the upper tibia is closed. The squamous cell surgery site had slight amount of eschar over this. She is complaining of irritation of her skin probably a result of the compression. She also has a small area just under her original traumatic wound that popped up under compression this week READMISSION 11/01/2021 This is a now [age]-year-old woman that we had in the clinic last year at that time she had a wound on her left lower leg which was an abrasion from a fall and also a secondary wound from a surgical extraction of a squamous cell carcinoma. These areas healed. She tells me that currently she had 2 areas removed by Dr. Nehemiah Massed dermatology at [facility] in December. He felt he fully extracted these. As per her usual she goes to [place] for the winter. Unfortunately the larger area which is more medial developed obvious recurrences these were extracted by dermatology in [place]. She then underwent Mohs surgery and because of recurrent positive margin she had 2 further Mohs surgeries. There was no repair with the wounds set to close by secondary intention. Before she left [place] at the end of March she was prescribed cephalexin but she did not take this because she did not think they were infected [history of C. difficile]. She has been using some form of leftover  dressings she had from last yearo Polymen She returns to the [place] area with 2 wounds to the larger 1 a bit superior and medial and the smaller one more central. Both of these have nonviable surfaces. She has chronic venous  insufficiency and solar induced skin damage No change in her past medical history. Her ABI was normal on the right 11/15/2021; patient presents for follow-up. She has been using Hydrofera Blue every couple days. She has no issues or complaints today. She denies signs of infection. 5/3; patient presents for follow-up. She has been using Hydrofera Blue to the wound beds. She has no issues or complaints today. The right lateral lower extremity wound has healed. She denies signs of infection. 5/10; patient presents for follow-up. She saw her dermatologist last week and had 2 more suspicious areas removed. She now has 3 wounds [name], [name]. ([id number]) present. She has been using Vaseline and Hydrofera Blue daily to the wound beds. She currently denies signs of infection. 5/17; patient presents for follow-up. She has been using Santyl and Hydrofera Blue to the wound beds without issues. Occasionally she will forget to put the dressing on to her most proximal right leg wound. She currently denies signs of infection. 5/31; patient presents for follow-up. She has been using Santyl and Hydrofera Blue to the wound beds with significant improvement in wound healing. She has no issues or complaints today. She has been using Tubigrip to help keep the dressings in place and add some compression. 6/14; patient presents for follow-up. She has been using bacitracin ointment to the wound sites. She states that these have healed up. She has no issues or complaints today. Objective Constitutional Vitals Time Taken: 10:00 AM, Height: 67 in, Weight: 151 lbs, BMI: 23.6, Temperature: 98.2 F, Pulse: 56 bpm, Respiratory Rate: 18 breaths/min, Blood Pressure: 106/63 mmHg. General Notes: Right lower extremity: Epithelization to the previous wound sites. No open wounds present. Integumentary (Hair, Skin) Wound #4 status is Healed - Epithelialized. Original cause of wound was Gradually Appeared. The date acquired was:  06/22/2021. The wound has been in treatment 9 weeks. The wound is located on the Right,Proximal,Medial Lower Leg. The wound measures 0cm length x 0cm width x 0cm depth; 0cm^2 area and 0cm^3 volume. There is a none present amount of drainage noted. There is no granulation within the wound bed. There is no necrotic tissue within the wound bed. Wound #6 status is Healed - Epithelialized. Original cause of wound was Surgical Injury. The date acquired was: 11/23/2021. The wound has been in treatment 5 weeks. The wound is located on the Right,Medial Upper Leg. The wound measures 0cm length x 0cm width x 0cm depth; 0cm^2 area and 0cm^3 volume. There is a none present amount of drainage noted. There is no granulation within the wound bed. There is no necrotic tissue within the wound bed. Assessment Active Problems ICD-10 Disruption of external operation (surgical) wound, not elsewhere classified, subsequent encounter Non-pressure chronic ulcer of other part of right lower leg with other specified severity Squamous cell carcinoma of skin of right lower limb, including hip Patient has done well with bacitracin. Her wounds have healed up nicely. She may follow-up as needed. Plan 1. Discharge from the wound center due to closed wound 2. Follow-up as needed Electronic Signature(s) Signed: 01/03/2022 11:59:07 AM By: Kalman Shan DO Entered By: Kalman Shan on 01/03/2022 11:07:19 [name] ([id number]) -------------------------------------------------------------------------------- SuperBill Details Patient Name: [name] Date of Service: 01/03/2022 Medical Record Number: [id number] Patient  Account Number: [id number] Date of Birth/Sex: 20-Apr-1946 (76 y.o. F) Treating RN: [name] Primary Care Provider: Harold Clark Other Clinician: Massie Clark Referring Provider: Harold Clark Treating Provider/Extender: [name] in Treatment: 9 Diagnosis Coding ICD-10  Codes Code Description T81.31XD Disruption of external operation (surgical) wound, not elsewhere classified, subsequent encounter L97.818 Non-pressure chronic ulcer of other part of right lower leg with other specified severity C44.722 Squamous cell carcinoma of skin of right lower limb, including hip Physician Procedures CPT4 Code Description: [id number] - WC PHYS LEVEL 3 - EST PT Modifier: Quantity: 1 CPT4 Code Description: ICD-10 Diagnosis Description T81.31XD Disruption of external operation (surgical) wound, not elsewhere classified, L97.818 Non-pressure chronic ulcer of other part of right lower leg with other speci C44.722 Squamous cell carcinoma  of skin of right lower limb, including hip Modifier: subsequent enco fied severity Quantity: Personal assistant) Signed: 01/03/2022 11:59:07 AM By: Kalman Shan DO Signed: 01/03/2022 1:49:35 PM By: [name] Entered By: [name] on 01/03/2022 11:16:24

## 2022-01-04 DIAGNOSIS — H11052 Peripheral pterygium, progressive, left eye: Secondary | ICD-10-CM | POA: Diagnosis not present

## 2022-01-05 DIAGNOSIS — H11052 Peripheral pterygium, progressive, left eye: Secondary | ICD-10-CM | POA: Diagnosis not present

## 2022-01-08 NOTE — Progress Notes (Signed)
TAQUISHA, PHUNG ([id number]) Visit Report for 01/03/2022 Arrival Information Details Patient Name: Heather, Clark. Date of Service: 01/03/2022 9:45 AM Medical Record Number: [id number] Patient Account Number: [id number] Date of Birth/Sex: 1945/09/07 (76 y.o. F) Treating RN: Cornell Barman Primary Care : Harold Barban Other Clinician: Massie Kluver Referring : Harold Barban Treating /Extender: Yaakov Guthrie in Treatment: 9 Visit Information History Since Last Visit All ordered tests and consults were completed: No Patient Arrived: Ambulatory Added or deleted any medications: No Arrival Time: 09:59 Any new allergies or adverse reactions: No Transfer Assistance: None Had a fall or experienced change in No Patient Requires Transmission-Based Precautions: No activities of daily living that may affect Patient Has Alerts: Yes risk of falls: Patient Alerts: NOT diabetic Signs or symptoms of abuse/neglect since last visito No Hospitalized since last visit: No Pain Present Now: No Electronic Signature(s) Signed: 01/03/2022 1:49:35 PM By: Massie Kluver Entered By: Massie Kluver on 01/03/2022 09:59:30 [name] ([id number]) -------------------------------------------------------------------------------- Clinic Level of Care Assessment Details Patient Name: [name] Date of Service: 01/03/2022 9:45 AM Medical Record Number: [id number] Patient Account Number: [id number] Date of Birth/Sex: 11-Jan-1946 (76 y.o. F) Treating RN: Cornell Barman Primary Care : Harold Barban Other Clinician: Massie Kluver Referring : Harold Barban Treating /Extender: Yaakov Guthrie in Treatment: 9 Clinic Level of Care Assessment Items TOOL 4 Quantity Score '[]'$  - Use when only an EandM is performed on FOLLOW-UP visit 0 ASSESSMENTS - Nursing Assessment / Reassessment X - Reassessment of Co-morbidities (includes updates in patient  status) 1 10 '[]'$  - 0 Reassessment of Adherence to Treatment Plan ASSESSMENTS - Wound and Skin Assessment / Reassessment '[]'$  - Simple Wound Assessment / Reassessment - one wound 0 X- 2 5 Complex Wound Assessment / Reassessment - multiple wounds '[]'$  - 0 Dermatologic / Skin Assessment (not related to wound area) ASSESSMENTS - Focused Assessment '[]'$  - Circumferential Edema Measurements - multi extremities 0 '[]'$  - 0 Nutritional Assessment / Counseling / Intervention '[]'$  - 0 Lower Extremity Assessment (monofilament, tuning fork, pulses) '[]'$  - 0 Peripheral Arterial Disease Assessment (using hand held doppler) ASSESSMENTS - Ostomy and/or Continence Assessment and Care '[]'$  - Incontinence Assessment and Management 0 '[]'$  - 0 Ostomy Care Assessment and Management (repouching, etc.) PROCESS - Coordination of Care X - Simple Patient / Family Education for ongoing care 1 15 '[]'$  - 0 Complex (extensive) Patient / Family Education for ongoing care '[]'$  - 0 Staff obtains Programmer, systems, Records, Test Results / Process Orders '[]'$  - 0 Staff telephones HHA, Nursing Homes / Clarify orders / etc '[]'$  - 0 Routine Transfer to another Facility (non-emergent condition) '[]'$  - 0 Routine Hospital Admission (non-emergent condition) '[]'$  - 0 New Admissions / Biomedical engineer / Ordering NPWT, Apligraf, etc. '[]'$  - 0 Emergency Hospital Admission (emergent condition) X- 1 10 Simple Discharge Coordination '[]'$  - 0 Complex (extensive) Discharge Coordination PROCESS - Special Needs '[]'$  - Pediatric / Minor Patient Management 0 '[]'$  - 0 Isolation Patient Management '[]'$  - 0 Hearing / Language / Visual special needs '[]'$  - 0 Assessment of Community assistance (transportation, D/C planning, etc.) '[]'$  - 0 Additional assistance / Altered mentation '[]'$  - 0 Support Surface(s) Assessment (bed, cushion, seat, etc.) INTERVENTIONS - Wound Cleansing / Measurement Heather, Clark. ([id number]) '[]'$  - 0 Simple Wound Cleansing - one wound X- 2  5 Complex Wound Cleansing - multiple wounds X- 1 5 Wound Imaging (photographs - any number of wounds) '[]'$  - 0 Wound Tracing (instead of photographs) '[]'$  - 0  Simple Wound Measurement - one wound '[]'$  - 0 Complex Wound Measurement - multiple wounds INTERVENTIONS - Wound Dressings '[]'$  - Small Wound Dressing one or multiple wounds 0 '[]'$  - 0 Medium Wound Dressing one or multiple wounds '[]'$  - 0 Large Wound Dressing one or multiple wounds '[]'$  - 0 Application of Medications - topical '[]'$  - 0 Application of Medications - injection INTERVENTIONS - Miscellaneous '[]'$  - External ear exam 0 '[]'$  - 0 Specimen Collection (cultures, biopsies, blood, body fluids, etc.) '[]'$  - 0 Specimen(s) / Culture(s) sent or taken to Lab for analysis '[]'$  - 0 Patient Transfer (multiple staff / Civil Service fast streamer / Similar devices) '[]'$  - 0 Simple Staple / Suture removal (25 or less) '[]'$  - 0 Complex Staple / Suture removal (26 or more) '[]'$  - 0 Hypo / Hyperglycemic Management (close monitor of Blood Glucose) '[]'$  - 0 Ankle / Brachial Index (ABI) - do not check if billed separately X- 1 5 Vital Signs Has the patient been seen at the hospital within the last three years: Yes Total Score: 65 Level Of Care: New/Established - Level 2 Electronic Signature(s) Signed: 01/03/2022 1:49:35 PM By: Massie Kluver Entered By: Massie Kluver on 01/03/2022 11:16:12 [name] ([id number]) -------------------------------------------------------------------------------- Encounter Discharge Information Details Patient Name: [name] Date of Service: 01/03/2022 9:45 AM Medical Record Number: [id number] Patient Account Number: [id number] Date of Birth/Sex: 06/20/1946 (76 y.o. F) Treating RN: Cornell Barman Primary Care : Harold Barban Other Clinician: Massie Kluver Referring : Harold Barban Treating /Extender: Yaakov Guthrie in Treatment: 9 Encounter Discharge Information Items Discharge Condition:  Stable Ambulatory Status: Ambulatory Discharge Destination: Home Transportation: Private Auto Accompanied By: husband Schedule Follow-up Appointment: No Clinical Summary of Care: Electronic Signature(s) Signed: 01/03/2022 1:49:35 PM By: Massie Kluver Entered By: Massie Kluver on 01/03/2022 11:17:50 [name] ([id number]) -------------------------------------------------------------------------------- Lower Extremity Assessment Details Patient Name: [name] Date of Service: 01/03/2022 9:45 AM Medical Record Number: [id number] Patient Account Number: [id number] Date of Birth/Sex: February 18, 1946 (76 y.o. F) Treating RN: Cornell Barman Primary Care : Harold Barban Other Clinician: Massie Kluver Referring : Harold Barban Treating /Extender: Yaakov Guthrie in Treatment: 9 Electronic Signature(s) Signed: 01/03/2022 1:49:35 PM By: Massie Kluver Signed: 01/04/2022 5:09:42 PM By: Gretta Cool, BSN, RN, CWS, Kim RN, BSN Entered By: Massie Kluver on 01/03/2022 10:11:57 [name] ([id number]) -------------------------------------------------------------------------------- Multi Wound Chart Details Patient Name: [name] Date of Service: 01/03/2022 9:45 AM Medical Record Number: [id number] Patient Account Number: [id number] Date of Birth/Sex: July 10, 1946 (76 y.o. F) Treating RN: Cornell Barman Primary Care : Harold Barban Other Clinician: Massie Kluver Referring : Harold Barban Treating /Extender: Yaakov Guthrie in Treatment: 9 Vital Signs Height(in): 59 Pulse(bpm): 3 Weight(lbs): 151 Blood Pressure(mmHg): 106/63 Body Mass Index(BMI): 23.6 Temperature(F): 98.2 Respiratory Rate(breaths/min): 18 Photos: [N/A:N/A] Wound Location: Right, Proximal, Medial Lower Leg Right, Medial Upper Leg N/A Wounding Event: Gradually Appeared Surgical Injury N/A Primary Etiology: Open Surgical Wound Open Surgical  Wound N/A Comorbid History: Cataracts, Arrhythmia, Cataracts, Arrhythmia, N/A Hypertension, Colitis, Received Hypertension, Colitis, Received Chemotherapy Chemotherapy Date Acquired: 06/22/2021 11/23/2021 N/A Weeks of Treatment: 9 5 N/A Wound Status: Healed - Epithelialized Healed - Epithelialized N/A Wound Recurrence: No No N/A Measurements L x W x D (cm) 0x0x0 0x0x0 N/A Area (cm) : 0 0 N/A Volume (cm) : 0 0 N/A % Reduction in Area: 100.00% 100.00% N/A % Reduction in Volume: 100.00% 100.00% N/A Classification: Full Thickness Without Exposed Full Thickness Without Exposed N/A Support Structures Support Structures Exudate Amount:  None Present None Present N/A Granulation Amount: None Present (0%) None Present (0%) N/A Necrotic Amount: None Present (0%) None Present (0%) N/A Exposed Structures: Fascia: No N/A N/A Fat Layer (Subcutaneous Tissue): No Tendon: No Muscle: No Joint: No Bone: No Epithelialization: Large (67-100%) Large (67-100%) N/A Treatment Notes Electronic Signature(s) Signed: 01/03/2022 1:49:35 PM By: Massie Kluver Entered By: Massie Kluver on 01/03/2022 11:14:40 [name] ([id number]) -------------------------------------------------------------------------------- [place] Details Patient Name: [name] Date of Service: 01/03/2022 9:45 AM Medical Record Number: [id number] Patient Account Number: [id number] Date of Birth/Sex: June 07, 1946 (76 y.o. F) Treating RN: Cornell Barman Primary Care : Harold Barban Other Clinician: Massie Kluver Referring : Harold Barban Treating /Extender: Yaakov Guthrie in Treatment: 9 Active Inactive Electronic Signature(s) Signed: 01/04/2022 5:09:42 PM By: Gretta Cool BSN, RN, CWS, Kim RN, BSN Signed: 01/08/2022 3:03:11 PM By: Massie Kluver Previous Signature: 01/03/2022 1:49:35 PM Version By: Massie Kluver Entered By: Massie Kluver on 01/04/2022 16:16:21 [name] ([id number]) -------------------------------------------------------------------------------- Pain Assessment Details Patient Name: [name] Date of Service: 01/03/2022 9:45 AM Medical Record Number: [id number] Patient Account Number: [id number] Date of Birth/Sex: 12-Oct-1945 (76 y.o. F) Treating RN: Cornell Barman Primary Care : Harold Barban Other Clinician: Massie Kluver Referring : Harold Barban Treating /Extender: Yaakov Guthrie in Treatment: 9 Active Problems Location of Pain Severity and Description of Pain Patient Has Paino No Site Locations Pain Management and Medication Current Pain Management: Electronic Signature(s) Signed: 01/03/2022 1:49:35 PM By: Massie Kluver Signed: 01/04/2022 5:09:42 PM By: Gretta Cool, BSN, RN, CWS, Kim RN, BSN Entered By: Massie Kluver on 01/03/2022 10:03:22 [name] ([id number]) -------------------------------------------------------------------------------- Patient/Caregiver Education Details Patient Name: [name] Date of Service: 01/03/2022 9:45 AM Medical Record Number: [id number] Patient Account Number: [id number] Date of Birth/Gender: Nov 28, 1945 (76 y.o. F) Treating RN: Cornell Barman Primary Care Physician: Harold Barban Other Clinician: Massie Kluver Referring Physician: Harold Barban Treating Physician/Extender: Yaakov Guthrie in Treatment: 9 Education Assessment Education Provided To: Patient Education Topics Provided Infection: Handouts: Infection Prevention and Management Methods: Explain/Verbal Responses: State content correctly Electronic Signature(s) Signed: 01/03/2022 1:49:35 PM By: Massie Kluver Entered By: Massie Kluver on 01/03/2022 11:17:05 [name] ([id number]) -------------------------------------------------------------------------------- Wound Assessment Details Patient Name: [name] Date of Service: 01/03/2022 9:45 AM Medical  Record Number: [id number] Patient Account Number: [id number] Date of Birth/Sex: 1946-03-22 (76 y.o. F) Treating RN: Cornell Barman Primary Care : Harold Barban Other Clinician: Massie Kluver Referring : Harold Barban Treating /Extender: Yaakov Guthrie in Treatment: 9 Wound Status Wound Number: 4 Primary Open Surgical Wound Etiology: Wound Location: Right, Proximal, Medial Lower Leg Wound Status: Healed - Epithelialized Wounding Event: Gradually Appeared Comorbid Cataracts, Arrhythmia, Hypertension, Colitis, Received Date Acquired: 06/22/2021 History: Chemotherapy Weeks Of Treatment: 9 Clustered Wound: No Photos Wound Measurements Length: (cm) 0 Width: (cm) 0 Depth: (cm) 0 Area: (cm) 0 Volume: (cm) 0 % Reduction in Area: 100% % Reduction in Volume: 100% Epithelialization: Large (67-100%) Wound Description Classification: Full Thickness Without Exposed Support Structures Exudate Amount: None Present Foul Odor After Cleansing: No Slough/Fibrino No Wound Bed Granulation Amount: None Present (0%) Exposed Structure Necrotic Amount: None Present (0%) Fascia Exposed: No Fat Layer (Subcutaneous Tissue) Exposed: No Tendon Exposed: No Muscle Exposed: No Joint Exposed: No Bone Exposed: No Treatment Notes Wound #4 (Lower Leg) Wound Laterality: Right, Medial, Proximal Cleanser Peri-Wound Care Topical Primary Dressing [name], [name] ([id number]) Secondary Dressing Secured With Compression Wrap Compression Stockings Add-Ons Electronic Signature(s) Signed: 01/03/2022 1:49:35 PM By: Clifton James,  Angie Signed: 01/04/2022 5:09:42 PM By: Gretta Cool, BSN, RN, CWS, Kim RN, BSN Entered By: Massie Kluver on 01/03/2022 10:10:52 [name] ([id number]) -------------------------------------------------------------------------------- Wound Assessment Details Patient Name: [name] Date of Service: 01/03/2022 9:45 AM Medical Record Number:  [id number] Patient Account Number: [id number] Date of Birth/Sex: 09-Oct-1945 (76 y.o. F) Treating RN: Cornell Barman Primary Care : Harold Barban Other Clinician: Massie Kluver Referring : Harold Barban Treating /Extender: Yaakov Guthrie in Treatment: 9 Wound Status Wound Number: 6 Primary Open Surgical Wound Etiology: Wound Location: Right, Medial Upper Leg Wound Status: Healed - Epithelialized Wounding Event: Surgical Injury Comorbid Cataracts, Arrhythmia, Hypertension, Colitis, Received Date Acquired: 11/23/2021 History: Chemotherapy Weeks Of Treatment: 5 Clustered Wound: No Photos Wound Measurements Length: (cm) 0 Width: (cm) 0 Depth: (cm) 0 Area: (cm) 0 Volume: (cm) 0 % Reduction in Area: 100% % Reduction in Volume: 100% Epithelialization: Large (67-100%) Wound Description Classification: Full Thickness Without Exposed Support Structures Exudate Amount: None Present Foul Odor After Cleansing: No Slough/Fibrino No Wound Bed Granulation Amount: None Present (0%) Necrotic Amount: None Present (0%) Treatment Notes Wound #6 (Upper Leg) Wound Laterality: Right, Medial Cleanser Peri-Wound Care Topical Primary Dressing Secondary Dressing Secured With Compression Wrap [name], [name] ([id number]) Compression Stockings Add-Ons Electronic Signature(s) Signed: 01/03/2022 1:49:35 PM By: Massie Kluver Signed: 01/04/2022 5:09:42 PM By: Gretta Cool, BSN, RN, CWS, Kim RN, BSN Entered By: Massie Kluver on 01/03/2022 10:11:40 [name] ([id number]) -------------------------------------------------------------------------------- Vitals Details Patient Name: [name] Date of Service: 01/03/2022 9:45 AM Medical Record Number: [id number] Patient Account Number: [id number] Date of Birth/Sex: [birth year] (76 y.o. F) Treating RN: Cornell Barman Primary Care : Harold Barban Other Clinician: Massie Kluver Referring :  Harold Barban Treating /Extender: Yaakov Guthrie in Treatment: 9 Vital Signs Time Taken: 10:00 Temperature (F): 98.2 Height (in): 67 Pulse (bpm): 56 Weight (lbs): 151 Respiratory Rate (breaths/min): 18 Body Mass Index (BMI): 23.6 Blood Pressure (mmHg): 106/63 Reference Range: 80 - 120 mg / dl Electronic Signature(s) Signed: 01/03/2022 1:49:35 PM By: Massie Kluver Entered By: Massie Kluver on 01/03/2022 10:02:48

## 2022-01-22 ENCOUNTER — Telehealth: Payer: Self-pay | Admitting: Cardiovascular Disease

## 2022-01-22 NOTE — Telephone Encounter (Signed)
Refill will be addressed at follow up visit. Pt has hx of no show/cancelling appointments.

## 2022-01-22 NOTE — Telephone Encounter (Signed)
Just fyi this pt has hx of no show/cancelling appointments. Pt hasn't been seen since 2021. We have gave the pt refills to get her by until her appointments. Pt is completely out at this time. She has an appointment 7/10 with [facility].

## 2022-01-22 NOTE — Telephone Encounter (Signed)
*  Correction Dunn

## 2022-01-22 NOTE — Telephone Encounter (Signed)
*  STAT* If patient is at the pharmacy, call can be transferred to refill team.   1. Which medications need to be refilled? (please list name of each medication and dose if known) rosuvastatin (CRESTOR) 5 MG tablet  2. Which pharmacy/location (including street and city if local pharmacy) is medication to be sent to? [place]  3. Do they need a 30 day or 90 day supply? 30 day   Patient is completely out of medication. She has an appointment 01/29/2022

## 2022-01-24 MED ORDER — ROSUVASTATIN CALCIUM 5 MG PO TABS: 5.0000 mg | ORAL_TABLET | Freq: Every day | ORAL | 0 refills | Status: DC

## 2022-01-24 NOTE — Telephone Encounter (Signed)
Rx request sent to pharmacy. 5 day supply sent with note that she must keep her appt for any future refills.

## 2022-01-26 NOTE — Progress Notes (Deleted)
Cardiology Office Note    Date:  01/26/2022   ID:  Shevy, Yaney 1945-11-23, MRN [id number]  PCP:  Marinda Elk, MD  Cardiologist:  Ida Rogue, MD  Electrophysiologist:  None   Chief Complaint: Follow up  History of Present Illness:   [name] is a 76 y.o. female with history of coronary artery calcification noted on CT imaging, PAF on Xarelto, pSVT, mild to moderate mitral regurgitation, HLD, prior tobacco use, and GERD who presents for follow up of coronary artery calcification and Afib.   She was diagnosed with Afib in 2013 and treated with DCCV.  She has been maintained on flecainide, metoprolol, and ***. Prior calcium score in 2016 of 50, which was in the 69th percentile with calcium noted to be in the proximal to mid LAD. Follow-up exercise Myoview in 05/2015 showing no infarct or ischemia with an EF of 73% and was overall a low risk study. Echo in 04/2019 showed an EF of 55 to 60%, RV normal size and function, bilateral atrium normal size, mild mitral valve prolapse, mild to moderate MR. Zio patch at that time showed a predominant rhythm of sinus with 1 run of NSVT lasting 7 beats and 95 episodes of SVT with the longest episode lasting 13 beats. She was last seen in the office in 06/2020, and noted brief episodes of Afib that were self-limiting.   ***   Labs independently reviewed: 05/2021 - Hgb 12.5, PLT 193, potassium 4.2, BUN 19, serum creatinine 0.9, albumin 4.1, AST/ALT normal, TC 170, TG 126, HDL 61, LDL 83, TSH normal 01/2020 - A1c 5.6  Past Medical History:  Diagnosis Date   Actinic keratosis 07/13/2020   R lat thigh - bx proven    Arthritis    "fingers; right shoulder"   Atypical mole 12/10/2012   left sacral (severe)   Blood transfusion without reported diagnosis    Broken ribs    C. difficile diarrhea    Cataract    Clostridium difficile colitis 04/01/2016   Collagenous colitis    Coronary Calcium    a. 11/2014 Cardiac CT: Ca2+ score = 50  - all in prox LAD. 69th %'ile for age/sex; b. 05/2015 Ex MV: EF 73%. No infarct/ischemia-->Low risk.   Diverticulosis    Esophageal candidiasis (HCC)    GERD (gastroesophageal reflux disease)    Heart murmur    Hiatal hernia 2004   Hyperlipidemia    IBS (irritable bowel syndrome)    Internal hemorrhoids 2010   Colon-Dr. Vira Agar    Kidney stones    Midsternal chest pain    a. 01/2011 Ex Mv: EF 68%, No ischemia.   Migraine 01/16/2012   "none now for several years"   Mild Mitral regurgitation    a. 12/2011 Echo: EF 55-60%, no rwma, No evidence of MVP, Mild MR   Osteoporosis    PAF (paroxysmal atrial fibrillation) ([place])    a. diagnosed 12/2011-->s/p DCCV-->flecainide/Xarelto (CHA2DS2VASc = 2).   Pancreas divisum of native pancreas    probable by CT   Pterygium eye, bilateral    Schatzki's ring 2010    EGD- Dr. Erven Colla intestinal bacterial overgrowth 06/25/2018   Squamous cell carcinoma of skin 03/31/2014   left mid pretibial, right mid pretibial sup, right mid pretibial inf   Squamous cell carcinoma of skin 05/19/2014   right ant pretibial inf   Squamous cell carcinoma of skin 09/05/2015   left dorsum wrist   Squamous cell carcinoma  of skin 11/10/2015   left ant distal thigh   Squamous cell carcinoma of skin 11/26/2017   right prox calf   Squamous cell carcinoma of skin 07/09/2018   left prox lat pretibial   Squamous cell carcinoma of skin 12/02/2018   left pretibial   Squamous cell carcinoma of skin 05/25/2020   R lower leg anterior    Squamous cell carcinoma of skin 07/13/2020   R ant thigh    Squamous cell carcinoma of skin 11/28/2020   Left distal lat pretibial near ankle - EDC   Squamous cell carcinoma of skin 02/22/2021   L med knee - ED&C   Squamous cell carcinoma of skin 02/22/2021   L mid to distal pretibial - ED&C   Squamous cell carcinoma of skin 07/11/2021   Right medial pretibial - EDC   Squamous cell carcinoma of skin 11/15/2021   Right medial  thigh - EDC   Squamous cell carcinoma of skin 11/23/2021   right medial ankle, EDC    Past Surgical History:  Procedure Laterality Date   APPENDECTOMY  1963   BREAST CYST ASPIRATION Bilateral    "several; both sides"   CARDIOVERSION  01/16/2012   Procedure: CARDIOVERSION;  Surgeon: Evans Lance, MD;  Location: [facility];  Service: Cardiovascular;  Laterality: N/A;   COLONOSCOPY  Multiple   CYSTOSCOPY W/ STONE MANIPULATION  1980's   EYE SURGERY     x4   LEG SURGERY     skin cancer removal   OVARIAN CYST REMOVAL  1990's   SHOULDER ARTHROSCOPY W/ ROTATOR CUFF REPAIR Right ~ 2005   SQUAMOUS CELL CARCINOMA EXCISION     "10-12 removed; all over my body - nose, predominately legs"   [facility] ENDOSCOPY  Multiple   w/dilation, hiatal hernia   VAGINAL HYSTERECTOMY  1972    Current Medications: No outpatient medications have been marked as taking for the 01/29/22 encounter (Appointment) with Rise Mu, PA-C.    Allergies:   Latex, Adhesive [tape], Aspirin, Nicotinamide [niacinamide], and Prednisone   Social History   Socioeconomic History   Marital status: Married    Spouse name: Not on file   Number of children: 1   Years of education: Not on file   Highest education level: Not on file  Occupational History   Occupation: retired  Tobacco Use   Smoking status: Former    Packs/day: 0.10    Years: 40.00    Total pack years: 4.00    Types: Cigarettes    Quit date: 01/16/2012    Years since quitting: 10.0   Smokeless tobacco: Never  Vaping Use   Vaping Use: Never used  Substance and Sexual Activity   Alcohol use: Not Currently    Alcohol/week: 0.0 standard drinks of alcohol   Drug use: No   Sexual activity: Not on file  Other Topics Concern   Not on file  Social History Narrative   Lives in [place] with husband.   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 There is no history of Stomach cancer, Pancreatic cancer, Esophageal cancer, Colon cancer, or Rectal cancer.  ROS:   ROS   EKGs/Labs/Other Studies Reviewed:    Studies reviewed were summarized above. The additional studies were reviewed today:  2D echo 12/2011: - Left ventricle: The cavity size was normal. Wall thickness    was normal. Systolic function was normal. The estimated    ejection fraction was in the range of 55% to 60%. Wall    motion was normal; there were no regional wall motion    abnormalities.  - Mitral valve: Mild regurgitation.  __________  Calcium score 12/10/2014: IMPRESSION: Coronary calcium score of 50, all in proximal to mid LAD. This was 56 percentile for age and sex matched control. __________   Nuclear stress test 06/21/2015: Exercise myocardial perfusion imaging study with no significant  ischemia Normal wall motion, EF estimated at 73% ST depressions concerning for ischemia at peak stress.  Target heart rate achieved, adequate exercise tolerance Low risk scan  Given EKG changes at peak stress, close clinical observation suggested with aggressive risk factor modification __________  Elwyn Reach patch 03/2019: Normal sinus rhythm avg HR of 62 bpm.    1 run of Ventricular Tachycardia occurred lasting 7 beats with a max rate of 158 bpm (avg 136 bpm).    95 Supraventricular Tachycardia runs occurred, the run with the  fastest interval lasting 7 beats with a max rate of 214 bpm, the longest lasting 13 beats with an avg rate of 115 bpm.    Isolated SVEs  were rare (<1.0%), SVE Couplets were rare (<1.0%), and SVE Triplets were rare (<1.0%). Isolated VEs were rare (<1.0%), VE Couplets were rare (<1.0%), and no VE Triplets were present. Ventricular Bigeminy and Trigeminy were present.   No patient triggered events ___________  2D echo 05/12/2019: 1. Left ventricular ejection fraction, by visual estimation, is 55 to  60%. The left ventricle has normal function. Normal left ventricular size.  There is no left ventricular hypertrophy.   2. Global right ventricle has normal systolic function.The right  ventricular size is normal. No increase in right ventricular wall  thickness.   3. Left atrial size was normal.   4. Right atrial size was normal.   5. Mild mitral valve prolapse.   6. The mitral valve is myxomatous. Mild to moderate mitral valve  regurgitation.   7. Severity of mitral regurgitation may be underestimated due to multiple  jets/eccentricity.   8. The tricuspid valve is grossly normal. Tricuspid valve regurgitation  is mild.   9. The aortic valve is tricuspid Aortic valve regurgitation is trivial by  color flow Doppler. Structurally normal aortic valve, with no evidence of  sclerosis or stenosis.  10. The pulmonic valve was not well visualized. Pulmonic valve  regurgitation is mild by color flow Doppler.  11. Aortic dilatation noted.  12. There is mild dilatation of the ascending aorta measuring 36 mm.  13. TR signal is inadequate for assessing pulmonary artery systolic  pressure.  14. The inferior vena cava is normal in size with greater than 50%  respiratory variability, suggesting right atrial pressure of 3 mmHg.  15. The interatrial septum was not well visualized.   EKG:  EKG  is ordered today.  The EKG ordered today demonstrates ***  Recent Labs: 03/31/2021: BUN 22; Creatinine, Ser  0.97; Hemoglobin 12.5; Platelets 219; Potassium 4.7; Sodium 140  Recent Lipid Panel    Component Value Date/Time   CHOL 161 05/19/2018 0936   CHOL 185 12/22/2014 1008   TRIG 61 05/19/2018 0936   HDL 65 05/19/2018 0936   HDL 72 12/22/2014 1008   CHOLHDL 2.5 05/19/2018 0936   VLDL 12 05/19/2018 0936   LDLCALC 84 05/19/2018 0936   LDLCALC 100 (H) 12/22/2014 1008    PHYSICAL EXAM:    VS:  There were no vitals taken for this visit.  BMI: There is no height or weight on file to calculate BMI.  Physical Exam  Wt Readings from Last 3 Encounters:  07/13/21 153 lb (69.4 kg)  05/23/21 151 lb 9.6 oz (68.8 kg)  05/19/21 153 lb 9.6 oz (69.7 kg)     ASSESSMENT & PLAN:   PAF:  Coronary artery calcification:  Mitral regurgitation:  HLD: LDL 83 in 05/2021.   {Are you ordering a CV Procedure (e.g. stress test, cath, DCCV, TEE, etc)?   Press F2        :[id number]}     Disposition: F/u with Dr. Rockey Situ or an APP in ***.   Medication Adjustments/Labs and Tests Ordered: Current medicines are reviewed at length with the patient today.  Concerns regarding medicines are outlined above. Medication changes, Labs and Tests ordered today are summarized above and listed in the Patient Instructions accessible in Encounters.   Signed, Christell Faith, PA-C 01/26/2022 10:20 AM     [address], [place] [address] [phone]

## 2022-01-29 ENCOUNTER — Ambulatory Visit: Payer: PPO | Admitting: Physician Assistant

## 2022-01-29 ENCOUNTER — Telehealth: Payer: Self-pay

## 2022-01-29 MED ORDER — ROSUVASTATIN CALCIUM 5 MG PO TABS: 5.0000 mg | ORAL_TABLET | Freq: Every day | ORAL | 0 refills | Status: DC

## 2022-01-29 NOTE — Telephone Encounter (Signed)
Additional refills for Rosuvastatin until appointment with Dr. Rockey Situ on 02/12/22.

## 2022-01-29 NOTE — Telephone Encounter (Signed)
Hi, would it be ok to extend the refill until her visit with Dr. Rockey Situ on 02/12/22 since Idolina Primer is out today?

## 2022-01-29 NOTE — Telephone Encounter (Signed)
Provider out of office and having to reschedule appointment Requesting Dr Rockey Situ  Patient is now scheduled for 02/12/22 and out of medication

## 2022-02-09 NOTE — Progress Notes (Unsigned)
Cardiology Office Note  Date:  02/12/2022   ID:  Teale, Goodgame 1946-04-12, MRN [id number]  PCP:  Marinda Elk, MD   Chief Complaint  Patient presents with   12 month follow up     Patient c/o shortness of breath, feeling irregular heart beats at times, chest discomfort/stabbing pain that radiates into her back  and shoots down her left arm. Medications reviewed by the patient verbally.     HPI:  Ms Pond is a 76 year old woman with a history of  palpitations and tachycardia,  paroxysmal atrial fibrillation ,cardioversion. 2013 smoking history, stopped in 2012 CT coronary calcium score showing Mid LAD coronary calcification, score of 50 in 2016 who presents for routine followup of her atrial fibrillation.  Last seen by myself in clinic November 2020 Seen by one of our providers December 2021  Spending time in [place] Skin cancer legs, had a wound that is now healed Cancer in eyes, had eye surgery on multiple occasions, still struggling  Little bit of chest pain over the past several weeks She is concerned for angina Starts under breast, radiates upward into chest , into back 3 episodes recently Has SOB, lasting 5 to 6 minutes,   Rare atrial fibrillation spells, in general has been well controlled On flecainide 100 BID and metoprolol  EKG personally reviewed by myself on todays visit Normal sinus rhythm rate 62 bpm, PACs, nonspecific ST abnormality  Other past medical history reviewed Echocardiogram 04/2019 with LVEF 55 to 60%, RV normal size and function, bilateral atrium normal size, mild mitral valve prolapse, mild to moderate MR.  Echo: 05/12/19  1. Left ventricular ejection fraction, by visual estimation, is 55 to 60%. The left ventricle has normal function. Normal left ventricular size. There is no left ventricular hypertrophy.  2. Global right ventricle has normal systolic function.The right ventricular size is normal. No increase in right ventricular  wall thickness.  3. Left atrial size was normal.  4. Right atrial size was normal.  5. Mild mitral valve prolapse.  6. The mitral valve is myxomatous. Mild to moderate mitral valve regurgitation.  Monitor 05/06/2019 results reviewed with her avg HR of 62 bpm.  ---1 run of Ventricular Tachycardia occurred lasting 7 beats with a max rate of 158 bpm (avg 136 bpm).  ---95 Supraventricular Tachycardia runs occurred, the run with the fastest interval lasting 7 beats with a max rate of 214 bpm, the longest lasting 13 beats with an avg rate of 115 bpm.   Previous hospital evaluation at [facility] about 3 weeks ago with chest pain that radiated through to her back Records have been requested Was at [facility], developed chest pain on the left Very painful, lasted couple hours, 10/10 Through to back Hurt to take a breath in Received IV meds (morphine?)  enz negative per the patient scan looked ok  (CT? Or V/Q ?). Was told she had thickening of her esophagus  02/2016  CT ABD:  Aortic atherosclerosis.  Previous CT coronary calcium score of 50, most of her disease in the mid LAD Mild aorta atherosclerosis   Previously reported having diverticulitis, ulcerative colitis.    PMH:   has a past medical history of Actinic keratosis (07/13/2020), Arthritis, Atypical mole (12/10/2012), Blood transfusion without reported diagnosis, Broken ribs, C. difficile diarrhea, Cataract, Clostridium difficile colitis (04/01/2016), Collagenous colitis, Coronary Calcium, Diverticulosis, Esophageal candidiasis ([place]), GERD (gastroesophageal reflux disease), Heart murmur, Hiatal hernia (2004), Hyperlipidemia, IBS (irritable bowel syndrome), Internal hemorrhoids (2010), Kidney stones,  Midsternal chest pain, Migraine (01/16/2012), Mild Mitral regurgitation, Osteoporosis, PAF (paroxysmal atrial fibrillation) ([place]), Pancreas divisum of native pancreas, Pterygium eye, bilateral, Schatzki's ring (2010), Small intestinal  bacterial overgrowth (06/25/2018), Squamous cell carcinoma of skin (03/31/2014), Squamous cell carcinoma of skin (05/19/2014), Squamous cell carcinoma of skin (09/05/2015), Squamous cell carcinoma of skin (11/10/2015), Squamous cell carcinoma of skin (11/26/2017), Squamous cell carcinoma of skin (07/09/2018), Squamous cell carcinoma of skin (12/02/2018), Squamous cell carcinoma of skin (05/25/2020), Squamous cell carcinoma of skin (07/13/2020), Squamous cell carcinoma of skin (11/28/2020), Squamous cell carcinoma of skin (02/22/2021), Squamous cell carcinoma of skin (02/22/2021), Squamous cell carcinoma of skin (07/11/2021), Squamous cell carcinoma of skin (11/15/2021), and Squamous cell carcinoma of skin (11/23/2021).  PSH:    Past Surgical History:  Procedure Laterality Date   APPENDECTOMY  1963   BREAST CYST ASPIRATION Bilateral    "several; both sides"   CARDIOVERSION  01/16/2012   Procedure: CARDIOVERSION;  Surgeon: Evans Lance, MD;  Location: [facility];  Service: Cardiovascular;  Laterality: N/A;   COLONOSCOPY  Multiple   CYSTOSCOPY W/ STONE MANIPULATION  1980's   EYE SURGERY     x4   LEG SURGERY     skin cancer removal   OVARIAN CYST REMOVAL  1990's   SHOULDER ARTHROSCOPY W/ ROTATOR CUFF REPAIR Right ~ 2005   SQUAMOUS CELL CARCINOMA EXCISION     "10-12 removed; all over my body - nose, predominately legs"   [facility] ENDOSCOPY  Multiple   w/dilation, hiatal hernia   VAGINAL HYSTERECTOMY  1972    Current Outpatient Medications  Medication Sig Dispense Refill   ALPRAZolam (XANAX) 0.5 MG tablet Take 1 tablet by mouth at bedtime as needed.     Coenzyme Q10 (COQ10 PO) Take 1 tablet by mouth daily.     diphenoxylate-atropine (LOMOTIL) 2.5-0.025 MG tablet Take 1 tablet by mouth 4 (four) times daily as needed for diarrhea or loose stools. 60 tablet 3   escitalopram (LEXAPRO) 10 MG tablet Take 1 tablet (10 mg total) by mouth daily. 90  tablet 3   EYSUVIS 0.25 % SUSP Apply 1 drop to eye 3 (three) times daily.     famotidine (PEPCID) 20 MG tablet Take 20 mg by mouth daily.     flecainide (TAMBOCOR) 100 MG tablet Take 1 tablet (100 mg total) by mouth 2 (two) times daily. 180 tablet 3   fluorouracil (EFUDEX) 5 % cream Apply to the left lower leg BID x 7 days. 15 g 0   FML LIQUIFILM 0.1 % ophthalmic suspension Place 1 drop into both eyes 4 (four) times daily.     ibandronate (BONIVA) 150 MG tablet Take 1 tablet (150 mg total) by mouth every 30 (thirty) days. 1 tablet 0   metoprolol tartrate (LOPRESSOR) 50 MG tablet Take 1 tablet (50 mg) 2 hours prior to your CT. 1 tablet 0   rivaroxaban (XARELTO) 20 MG TABS tablet TAKE 1 TABLET BY MOUTH ONCE A DAY WITH SUPPER 90 tablet 1   famciclovir (FAMVIR) 500 MG tablet Take 1 tablet (500 mg total) by mouth 2 (two) times daily. (Patient not taking: Reported on 11/15/2021) 30 tablet 0   metoprolol succinate (TOPROL-XL) 25 MG 24 hr tablet Take 1.5 tablets (37.5 mg total) by mouth daily. 135 tablet 3   nitroGLYCERIN (NITROSTAT) 0.4 MG SL tablet Place 1 tablet (0.4 mg total) under the tongue every 5 (five) minutes as needed for chest pain. 25 tablet 3  rosuvastatin (CRESTOR) 5 MG tablet Take 1 tablet (5 mg total) by mouth daily. Must keep appointment( 02/12/22) for any future refills. Thank you! 90 tablet 3   No current facility-administered medications for this visit.    Allergies:   Latex, Adhesive [tape], Aspirin, Nicotinamide [niacinamide], and Prednisone   Social History:  The patient  reports that she quit smoking about 10 years ago. Her smoking use included cigarettes. She has a 4.00 pack-year smoking history. She has never used smokeless tobacco. She reports that she does not currently use alcohol. She reports that she does not use drugs.   Family History:   family history includes Alzheimer's disease in her mother; Diabetes in her father; Heart disease in her father; Irritable bowel  syndrome in her father; Kidney disease in her father.    Review of Systems: Review of Systems  Constitutional: Negative.   Respiratory: Negative.    Cardiovascular:  Positive for palpitations.  Gastrointestinal: Negative.   Musculoskeletal: Negative.   Neurological: Negative.   Psychiatric/Behavioral: Negative.    All other systems reviewed and are negative.   PHYSICAL EXAM: VS:  BP 120/60 (BP Location: Left Arm, Patient Position: Sitting, Cuff Size: Normal)   Pulse 62   Ht 5' 7.5" (1.715 m)   Wt 159 lb 8 oz (72.3 kg)   SpO2 98%   BMI 24.61 kg/m  , BMI Body mass index is 24.61 kg/m. Constitutional:  oriented to person, place, and time. No distress.  HENT:  Head: Grossly normal Eyes:  no discharge. No scleral icterus.  Neck: No JVD, no carotid bruits  Cardiovascular: Regular rate and rhythm, no murmurs appreciated Pulmonary/Chest: Clear to auscultation bilaterally, no wheezes or rails Abdominal: Soft.  no distension.  no tenderness.  Musculoskeletal: Normal range of motion Neurological:  normal muscle tone. Coordination normal. No atrophy Skin: Skin warm and dry Psychiatric: normal affect, pleasant  Recent Labs: 03/31/2021: BUN 22; Creatinine, Ser 0.97; Hemoglobin 12.5; Platelets 219; Potassium 4.7; Sodium 140    Lipid Panel Lab Results  Component Value Date   CHOL 161 05/19/2018   HDL 65 05/19/2018   LDLCALC 84 05/19/2018   TRIG 61 05/19/2018    Wt Readings from Last 3 Encounters:  02/12/22 159 lb 8 oz (72.3 kg)  07/13/21 153 lb (69.4 kg)  05/23/21 151 lb 9.6 oz (68.8 kg)     ASSESSMENT AND PLAN:  Atrial fibrillation with rapid ventricular response (HCC) - Rare episodes of paroxysmal atrial fibrillation No medication changes Continue flecainide 100 twice daily with metoprolol  Angina worsening symptoms in the past several weeks Prior risk factors including hyperlipidemia, smoking Given his severity of symptoms, we have recommended cardiac CTA for  further evaluation  Smoking We have encouraged her to continue to work on weaning her cigarettes and smoking cessation. She will continue to work on this and does not want any assistance with chantix.    Anxiety Higher level of anxiety recently in the setting of skin cancer on leg now well-healed, cancer in the eyes requiring multiple procedures   Total encounter time more than 30 minutes  Greater than 50% was spent in counseling and coordination of care with the patient     Orders Placed This Encounter  Procedures   CT CORONARY MORPH W/CTA COR W/SCORE W/CA W/CM &/OR WO/CM   Basic Metabolic Panel (BMET)   EKG 12-Lead     Signed, Esmond Plants, M.D., Ph.D. 02/12/2022  [place], [place]

## 2022-02-12 ENCOUNTER — Encounter: Payer: Self-pay | Admitting: Cardiovascular Disease

## 2022-02-12 ENCOUNTER — Ambulatory Visit: Payer: PPO | Admitting: Cardiovascular Disease

## 2022-02-12 VITALS — BP 120/60 | HR 62 | Ht 67.5 in | Wt 159.5 lb

## 2022-02-12 DIAGNOSIS — I480 Paroxysmal atrial fibrillation: Secondary | ICD-10-CM

## 2022-02-12 DIAGNOSIS — I340 Nonrheumatic mitral (valve) insufficiency: Secondary | ICD-10-CM | POA: Diagnosis not present

## 2022-02-12 DIAGNOSIS — E785 Hyperlipidemia, unspecified: Secondary | ICD-10-CM | POA: Diagnosis not present

## 2022-02-12 DIAGNOSIS — I209 Angina pectoris, unspecified: Secondary | ICD-10-CM

## 2022-02-12 DIAGNOSIS — I2510 Atherosclerotic heart disease of native coronary artery without angina pectoris: Secondary | ICD-10-CM | POA: Diagnosis not present

## 2022-02-12 DIAGNOSIS — I2584 Coronary atherosclerosis due to calcified coronary lesion: Secondary | ICD-10-CM | POA: Diagnosis not present

## 2022-02-12 DIAGNOSIS — R072 Precordial pain: Secondary | ICD-10-CM | POA: Diagnosis not present

## 2022-02-12 MED ORDER — ROSUVASTATIN CALCIUM 5 MG PO TABS: 5.0000 mg | ORAL_TABLET | Freq: Every day | ORAL | 3 refills | Status: DC

## 2022-02-12 MED ORDER — NITROGLYCERIN 0.4 MG SL SUBL
0.4000 mg | SUBLINGUAL_TABLET | SUBLINGUAL | 3 refills | Status: AC | PRN
Start: 2022-02-12 — End: ?

## 2022-02-12 MED ORDER — METOPROLOL TARTRATE 50 MG PO TABS: ORAL_TABLET | ORAL | 0 refills | Status: DC

## 2022-02-12 MED ORDER — METOPROLOL SUCCINATE ER 25 MG PO TB24: 37.5000 mg | ORAL_TABLET | Freq: Every day | ORAL | 3 refills | Status: DC

## 2022-02-12 NOTE — Patient Instructions (Addendum)
Medication Instructions:  No changes  If you need a refill on your cardiac medications before your next appointment, please call your pharmacy.   Lab work: Your physician recommends that you return for lab work (BMP) in: Today or this week  - Please go to the [facility]. You will check in at the front desk to the right as you walk into the atrium. Valet Parking is offered if needed. - No appointment needed. You may go any day between 7 am and 6 pm.    Testing/Procedures:  Your cardiac CT has been scheduled for Monday, July 31st at 10:15 am at:   [facility]  Please arrive 15 mins early for check-in and test prep.   Please follow these instructions carefully (unless otherwise directed):   On the Night Before the Test: Be sure to Drink plenty of water. Do not consume any caffeinated/decaffeinated beverages or chocolate 12 hours prior to your test. Do not take any antihistamines 12 hours prior to your test.   On the Day of the Test: Drink plenty of water until 1 hour prior to the test. Do not eat any food 4 hours prior to the test. You may take your regular medications prior to the test.  Take metoprolol (Lopressor) two hours prior to test. HOLD Furosemide/Hydrochlorothiazide morning of the test. FEMALES- please wear underwire-free bra if available, avoid dresses & tight clothing       After the Test: Drink plenty of water. After receiving IV contrast, you may experience a mild flushed feeling. This is normal. On occasion, you may experience a mild rash up to 24 hours after the test. This is not dangerous. If this occurs, you can take Benadryl 25 mg and increase your fluid intake. If you experience trouble breathing, this can be serious. If it is severe call 911 IMMEDIATELY. If it is mild, please call our office.  Please allow 2-4 weeks for scheduling of routine cardiac CTs. Some insurance companies require a pre-authorization which may delay  scheduling of this test.   For non-scheduling related questions, please contact the cardiac imaging nurse navigator should you have any questions/concerns: Marchia Bond, Cardiac Imaging Nurse Navigator Gordy Clement, Cardiac Imaging Nurse Navigator [facility] Direct Office Dial: [phone]   For scheduling needs, including cancellations and rescheduling, please call [place], [phone].   Follow-Up: At [facility], you and your health needs are our priority.  As part of our continuing mission to provide you with exceptional heart care, we have created designated Provider Care Teams.  These Care Teams include your primary Cardiologist (physician) and Advanced Practice Providers (APPs -  Physician Assistants and Nurse Practitioners) who all work together to provide you with the care you need, when you need it.  You will need a follow up appointment in 12 months  Providers on your designated Care Team:   Murray Hodgkins, NP Christell Faith, PA-C Cadence Kathlen Mody, [place]  COVID-19 Vaccine Information can be found at: [email or web address] For questions related to vaccine distribution or appointments, please email [email or web address] or call [phone].

## 2022-02-14 ENCOUNTER — Other Ambulatory Visit
Admission: RE | Admit: 2022-02-14 | Discharge: 2022-02-14 | Disposition: A | Discharge status: Home / Self Care | Payer: PPO | Attending: Cardiovascular Disease | Admitting: Cardiovascular Disease

## 2022-02-14 DIAGNOSIS — I2510 Atherosclerotic heart disease of native coronary artery without angina pectoris: Secondary | ICD-10-CM | POA: Insufficient documentation

## 2022-02-14 DIAGNOSIS — I2584 Coronary atherosclerosis due to calcified coronary lesion: Secondary | ICD-10-CM | POA: Diagnosis not present

## 2022-02-14 DIAGNOSIS — R072 Precordial pain: Secondary | ICD-10-CM | POA: Diagnosis not present

## 2022-02-14 LAB — BASIC METABOLIC PANEL
Anion gap: 6 (ref 5–15)
BUN: 18 mg/dL (ref 8–23)
CO2: 29 mmol/L (ref 22–32)
Calcium: 9 mg/dL (ref 8.9–10.3)
Chloride: 104 mmol/L (ref 98–111)
Creatinine, Ser: 0.83 mg/dL (ref 0.44–1.00)
GFR, Estimated: >60 mL/min (ref >60)
Glucose, Bld: 92 mg/dL (ref 70–99)
Potassium: 4.2 mmol/L (ref 3.5–5.1)
Sodium: 139 mmol/L (ref 135–145)

## 2022-02-15 ENCOUNTER — Ambulatory Visit: Payer: PPO | Admitting: Dermatology

## 2022-02-15 DIAGNOSIS — D485 Neoplasm of uncertain behavior of skin: Secondary | ICD-10-CM | POA: Diagnosis not present

## 2022-02-15 DIAGNOSIS — L578 Other skin changes due to chronic exposure to nonionizing radiation: Secondary | ICD-10-CM | POA: Diagnosis not present

## 2022-02-15 DIAGNOSIS — Z85828 Personal history of other malignant neoplasm of skin: Secondary | ICD-10-CM | POA: Diagnosis not present

## 2022-02-15 DIAGNOSIS — D0471 Carcinoma in situ of skin of right lower limb, including hip: Secondary | ICD-10-CM

## 2022-02-15 DIAGNOSIS — D489 Neoplasm of uncertain behavior, unspecified: Secondary | ICD-10-CM

## 2022-02-15 DIAGNOSIS — C44722 Squamous cell carcinoma of skin of right lower limb, including hip: Secondary | ICD-10-CM | POA: Diagnosis not present

## 2022-02-15 DIAGNOSIS — D0470 Carcinoma in situ of skin of unspecified lower limb, including hip: Secondary | ICD-10-CM | POA: Diagnosis not present

## 2022-02-15 DIAGNOSIS — C44721 Squamous cell carcinoma of skin of unspecified lower limb, including hip: Secondary | ICD-10-CM | POA: Diagnosis not present

## 2022-02-15 DIAGNOSIS — D487 Neoplasm of uncertain behavior of other specified sites: Secondary | ICD-10-CM | POA: Diagnosis not present

## 2022-02-15 DIAGNOSIS — L820 Inflamed seborrheic keratosis: Secondary | ICD-10-CM

## 2022-02-15 NOTE — Progress Notes (Signed)
Follow-Up Visit   Subjective  [name] is a 76 y.o. female who presents for the following: Other (Patient reports some new spots at right lower leg. Some spots at upper thigh of left leg. ). Patient has used 70f cream on left lower leg bid x 1 week 11/15/21 The patient has spots, moles and lesions to be evaluated, some may be new or changing and the patient has concerns that these could be cancer.  The following portions of the chart were reviewed this encounter and updated as appropriate:  Tobacco  Allergies  Meds  Problems  Med Hx  Surg Hx  Fam Hx     Review of Systems: No other skin or systemic complaints except as noted in HPI or Assessment and Plan.  Objective  Well appearing patient in no apparent distress; mood and affect are within normal limits.  A focused examination was performed including b/l legs, left upper thigh, face, b/l arms and neck. Relevant physical exam findings are noted in the Assessment and Plan.  right anterior ankle 0.7 cm hyperkeratotic papule        right medial lower leg above ankle inferior 3 cm hyperkeratotic papule        right medial lower leg above ankle superior 2 cm hyperkeratotic papule        b/l legs and neck x 8 (8) Erythematous stuck-on, waxy papule or plaque   Assessment & Plan  Neoplasm of uncertain behavior (3) right anterior ankle Epidermal / dermal shaving  Lesion diameter (cm):  0.7 Informed consent: discussed and consent obtained   Timeout: patient name, date of birth, surgical site, and procedure verified   Procedure prep:  Patient was prepped and draped in usual sterile fashion Prep type:  Isopropyl alcohol Anesthesia: the lesion was anesthetized in a standard fashion   Anesthetic:  1% lidocaine w/ epinephrine 1-100,000 buffered w/ 8.4% NaHCO3 Instrument used: flexible razor blade   Hemostasis achieved with: pressure, aluminum chloride and electrodesiccation   Outcome: patient tolerated  procedure well   Post-procedure details: sterile dressing applied and wound care instructions given   Dressing type: bandage and petrolatum    Destruction of lesion Complexity: extensive   Destruction method: electrodesiccation and curettage   Informed consent: discussed and consent obtained   Timeout:  patient name, date of birth, surgical site, and procedure verified Procedure prep:  Patient was prepped and draped in usual sterile fashion Prep type:  Isopropyl alcohol Anesthesia: the lesion was anesthetized in a standard fashion   Anesthetic:  1% lidocaine w/ epinephrine 1-100,000 buffered w/ 8.4% NaHCO3 Curettage performed in three different directions: Yes   Electrodesiccation performed over the curetted area: Yes   Lesion length (cm):  0.7 Lesion width (cm):  0.7 Margin per side (cm):  0.2 Final wound size (cm):  1.1 Hemostasis achieved with:  pressure, aluminum chloride and electrodesiccation Outcome: patient tolerated procedure well with no complications   Post-procedure details: sterile dressing applied and wound care instructions given   Dressing type: bandage and petrolatum    Specimen 1 - Surgical pathology Differential Diagnosis: R/o scc vs other  Check Margins: No  right medial lower leg above ankle inferior Skin / nail biopsy Type of biopsy: tangential   Informed consent: discussed and consent obtained   Timeout: patient name, date of birth, surgical site, and procedure verified   Procedure prep:  Patient was prepped and draped in usual sterile fashion Prep type:  Isopropyl alcohol Anesthesia: the lesion was anesthetized in  a standard fashion   Anesthetic:  1% lidocaine w/ epinephrine 1-100,000 buffered w/ 8.4% NaHCO3 Instrument used: flexible razor blade   Hemostasis achieved with: pressure, aluminum chloride and electrodesiccation   Outcome: patient tolerated procedure well   Post-procedure details: sterile dressing applied and wound care instructions given    Dressing type: petrolatum and bandage    Specimen 2 - Surgical pathology Differential Diagnosis: R/o scc vs other  Check Margins: No  right medial lower leg above ankle superior Skin / nail biopsy Type of biopsy: tangential   Informed consent: discussed and consent obtained   Timeout: patient name, date of birth, surgical site, and procedure verified   Procedure prep:  Patient was prepped and draped in usual sterile fashion Prep type:  Isopropyl alcohol Anesthesia: the lesion was anesthetized in a standard fashion   Anesthetic:  1% lidocaine w/ epinephrine 1-100,000 buffered w/ 8.4% NaHCO3 Instrument used: flexible razor blade   Hemostasis achieved with: pressure, aluminum chloride and electrodesiccation   Outcome: patient tolerated procedure well   Post-procedure details: sterile dressing applied and wound care instructions given   Dressing type: petrolatum and bandage    Specimen 3 - Surgical pathology Differential Diagnosis: R/o scc vs other  Check Margins: No R/o scc vs other   Inflamed seborrheic keratosis (8) b/l legs and neck x 8 Symptomatic, irritating, patient would like treated. Destruction of lesion - b/l legs and neck x 8 Complexity: simple   Destruction method: cryotherapy   Informed consent: discussed and consent obtained   Timeout:  patient name, date of birth, surgical site, and procedure verified Lesion destroyed using liquid nitrogen: Yes   Region frozen until ice ball extended beyond lesion: Yes   Outcome: patient tolerated procedure well with no complications   Post-procedure details: wound care instructions given   Additional details:  Prior to procedure, discussed risks of blister formation, small wound, skin dyspigmentation, or rare scar following cryotherapy. Recommend Vaseline ointment to treated areas while healing.  Actinic Damage - chronic, secondary to cumulative UV radiation exposure/sun exposure over time - diffuse scaly erythematous macules  with underlying dyspigmentation - Recommend daily broad spectrum sunscreen SPF 30+ to sun-exposed areas, reapply every 2 hours as needed.  - Recommend staying in the shade or wearing long sleeves, sun glasses (UVA+UVB protection) and wide brim hats (4-inch brim around the entire circumference of the hat). - Call for new or changing lesions.  History of Squamous Cell Carcinoma of the Skin -multiple on the legs especially May consider topical chemotherapy with 5-FU/calcipotriene with pretreatment with tazarotene. May also consider chemo wraps of the legs - No evidence of recurrence today - No lymphadenopathy - Recommend regular full body skin exams - Recommend daily broad spectrum sunscreen SPF 30+ to sun-exposed areas, reapply every 2 hours as needed.  - Call if any new or changing lesions are noted between office visits  Return for keep follow up as scheduled . IRuthell Rummage, CMA, am acting as scribe for Sarina Ser, MD. Documentation: I have reviewed the above documentation for accuracy and completeness, and I agree with the above.  Sarina Ser, MD

## 2022-02-15 NOTE — Patient Instructions (Signed)
Electrodesiccation and Curettage ("Scrape and Burn") Wound Care Instructions  Leave the original bandage on for 24 hours if possible.  If the bandage becomes soaked or soiled before that time, it is OK to remove it and examine the wound.  A small amount of post-operative bleeding is normal.  If excessive bleeding occurs, remove the bandage, place gauze over the site and apply continuous pressure (no peeking) over the area for 30 minutes. If this does not work, please call our clinic as soon as possible or page your doctor if it is after hours.   Once a day, cleanse the wound with soap and water. It is fine to shower. If a thick crust develops you may use a Q-tip dipped into dilute hydrogen peroxide (mix 1:1 with water) to dissolve it.  Hydrogen peroxide can slow the healing process, so use it only as needed.    After washing, apply petroleum jelly (Vaseline) or an antibiotic ointment if your doctor prescribed one for you, followed by a bandage.    For best healing, the wound should be covered with a layer of ointment at all times. If you are not able to keep the area covered with a bandage to hold the ointment in place, this may mean re-applying the ointment several times a day.  Continue this wound care until the wound has healed and is no longer open. It may take several weeks for the wound to heal and close.  Itching and mild discomfort is normal during the healing process.  If you have any discomfort, you can take Tylenol (acetaminophen) or ibuprofen as directed on the bottle. (Please do not take these if you have an allergy to them or cannot take them for another reason).  Some redness, tenderness and white or yellow material in the wound is normal healing.  If the area becomes very sore and red, or develops a thick yellow-green material (pus), it may be infected; please notify us.    Wound healing continues for up to one year following surgery. It is not unusual to experience pain in the scar  from time to time during the interval.  If the pain becomes severe or the scar thickens, you should notify the office.    A slight amount of redness in a scar is expected for the first six months.  After six months, the redness will fade and the scar will soften and fade.  The color difference becomes less noticeable with time.  If there are any problems, return for a post-op surgery check at your earliest convenience.  To improve the appearance of the scar, you can use silicone scar gel, cream, or sheets (such as Mederma or Serica) every night for up to one year. These are available over the counter (without a prescription).  Please call our office at [phone] for any questions or concerns.  Biopsy Wound Care Instructions  Leave the original bandage on for 24 hours if possible.  If the bandage becomes soaked or soiled before that time, it is OK to remove it and examine the wound.  A small amount of post-operative bleeding is normal.  If excessive bleeding occurs, remove the bandage, place gauze over the site and apply continuous pressure (no peeking) over the area for 30 minutes. If this does not work, please call our clinic as soon as possible or page your doctor if it is after hours.   Once a day, cleanse the wound with soap and water. It is fine to shower. If   a thick crust develops you may use a Q-tip dipped into dilute hydrogen peroxide (mix 1:1 with water) to dissolve it.  Hydrogen peroxide can slow the healing process, so use it only as needed.    After washing, apply petroleum jelly (Vaseline) or an antibiotic ointment if your doctor prescribed one for you, followed by a bandage.    For best healing, the wound should be covered with a layer of ointment at all times. If you are not able to keep the area covered with a bandage to hold the ointment in place, this may mean re-applying the ointment several times a day.  Continue this wound care until the wound has healed and is no longer  open.   Itching and mild discomfort is normal during the healing process. However, if you develop pain or severe itching, please call our office.   If you have any discomfort, you can take Tylenol (acetaminophen) or ibuprofen as directed on the bottle. (Please do not take these if you have an allergy to them or cannot take them for another reason).  Some redness, tenderness and white or yellow material in the wound is normal healing.  If the area becomes very sore and red, or develops a thick yellow-green material (pus), it may be infected; please notify us.    If you have stitches, return to clinic as directed to have the stitches removed. You will continue wound care for 2-3 days after the stitches are removed.   Wound healing continues for up to one year following surgery. It is not unusual to experience pain in the scar from time to time during the interval.  If the pain becomes severe or the scar thickens, you should notify the office.    A slight amount of redness in a scar is expected for the first six months.  After six months, the redness will fade and the scar will soften and fade.  The color difference becomes less noticeable with time.  If there are any problems, return for a post-op surgery check at your earliest convenience.  To improve the appearance of the scar, you can use silicone scar gel, cream, or sheets (such as Mederma or Serica) every night for up to one year. These are available over the counter (without a prescription).  Please call our office at [phone] for any questions or concerns.      Seborrheic Keratosis  What causes seborrheic keratoses? Seborrheic keratoses are harmless, common skin growths that first appear during adult life.  As time goes by, more growths appear.  Some people may develop a large number of them.  Seborrheic keratoses appear on both covered and uncovered body parts.  They are not caused by sunlight.  The tendency to develop seborrheic  keratoses can be inherited.  They vary in color from skin-colored to gray, brown, or even black.  They can be either smooth or have a rough, warty surface.   Seborrheic keratoses are superficial and look as if they were stuck on the skin.  Under the microscope this type of keratosis looks like layers upon layers of skin.  That is why at times the top layer may seem to fall off, but the rest of the growth remains and re-grows.    Treatment Seborrheic keratoses do not need to be treated, but can easily be removed in the office.  Seborrheic keratoses often cause symptoms when they rub on clothing or jewelry.  Lesions can be in the way of shaving.  If  they become inflamed, they can cause itching, soreness, or burning.  Removal of a seborrheic keratosis can be accomplished by freezing, burning, or surgery. If any spot bleeds, scabs, or grows rapidly, please return to have it checked, as these can be an indication of a skin cancer.      Cryotherapy Aftercare  Wash gently with soap and water everyday.   Apply Vaseline and Band-Aid daily until healed.   Due to recent changes in healthcare laws, you may see results of your pathology and/or laboratory studies on MyChart before the doctors have had a chance to review them. We understand that in some cases there may be results that are confusing or concerning to you. Please understand that not all results are received at the same time and often the doctors may need to interpret multiple results in order to provide you with the best plan of care or course of treatment. Therefore, we ask that you please give [place] 2 business days to thoroughly review all your results before contacting the office for clarification. Should we see a critical lab result, you will be contacted sooner.   If You Need Anything After Your Visit  If you have any questions or concerns for your doctor, please call our main line at [phone] and press option 4 to reach your doctor's  medical assistant. If no one answers, please leave a voicemail as directed and we will return your call as soon as possible. Messages left after 4 pm will be answered the following business day.   You may also send [place] a message via [place]. We typically respond to MyChart messages within 1-2 business days.  For prescription refills, please ask your pharmacy to contact our office. Our fax number is [phone].  If you have an urgent issue when the clinic is closed that cannot wait until the next business day, you can page your doctor at the number below.    Please note that while we do our best to be available for urgent issues outside of office hours, we are not available 24/7.   If you have an urgent issue and are unable to reach [place], you may choose to seek medical care at your doctor's office, retail clinic, urgent care center, or emergency room.  If you have a medical emergency, please immediately call 911 or go to the emergency department.  Pager Numbers  - Dr. Nehemiah Massed: [phone]  - Dr. Laurence Ferrari: [phone]  - Dr. Nicole Kindred: [phone]  In the event of inclement weather, please call our main line at [phone] for an update on the status of any delays or closures.  Dermatology Medication Tips: Please keep the boxes that topical medications come in in order to help keep track of the instructions about where and how to use these. Pharmacies typically print the medication instructions only on the boxes and not directly on the medication tubes.   If your medication is too expensive, please contact our office at [phone] option 4 or send [place] a message through [place].   We are unable to tell what your co-pay for medications will be in advance as this is different depending on your insurance coverage. However, we may be able to find a substitute medication at lower cost or fill out paperwork to get insurance to cover a needed medication.   If a prior authorization is required to  get your medication covered by your insurance company, please allow [place] 1-2 business days to complete this process.  Drug prices often vary  depending on where the prescription is filled and some pharmacies may offer cheaper prices.  The website www.goodrx.com contains coupons for medications through different pharmacies. The prices here do not account for what the cost may be with help from insurance (it may be cheaper with your insurance), but the website can give you the price if you did not use any insurance.  - You can print the associated coupon and take it with your prescription to the pharmacy.  - You may also stop by our office during regular business hours and pick up a GoodRx coupon card.  - If you need your prescription sent electronically to a different pharmacy, notify our office through [facility] or by phone at [phone] option 4.     Si Usted Necesita Algo Despus de Su Visita  Tambin puede enviarnos un mensaje a travs de Pharmacist, community. Por lo general respondemos a los mensajes de MyChart en el transcurso de 1 a 2 das hbiles.  Para renovar recetas, por favor pida a su farmacia que se ponga en contacto con nuestra oficina. Harland Dingwall de fax es [place] [phone].  Si tiene un asunto urgente cuando la clnica est cerrada y que no puede esperar hasta el siguiente da hbil, puede llamar/localizar a su doctor(a) al nmero que aparece a continuacin.   Por favor, tenga en cuenta que aunque hacemos todo lo posible para estar disponibles para asuntos urgentes fuera del horario de [place], no estamos disponibles las 24 horas del da, los 7 das de la [place].   Si tiene un problema urgente y no puede comunicarse con nosotros, puede optar por buscar atencin mdica  en el consultorio de su doctor(a), en una clnica privada, en un centro de atencin urgente o en una sala de emergencias.  Si tiene Engineering geologist, por favor llame inmediatamente al 911 o vaya a la sala de  emergencias.  Nmeros de bper  - Dr. Nehemiah Massed: [phone]  - Dra. Moye: [phone]  - Dra. Nicole Kindred: [phone]  En caso de inclemencias del [place], por favor llame a Johnsie Kindred principal al [phone] para una actualizacin sobre el [place] de cualquier retraso o cierre.  Consejos para la medicacin en dermatologa: Por favor, guarde las cajas en las que vienen los medicamentos de uso tpico para ayudarle a seguir las instrucciones sobre dnde y cmo usarlos. Las farmacias generalmente imprimen las instrucciones del medicamento slo en las cajas y no directamente en los tubos del [place].   Si su medicamento es muy caro, por favor, pngase en contacto con Zigmund Daniel llamando al [phone] y presione la opcin 4 o envenos un mensaje a travs de Pharmacist, community.   No podemos decirle cul ser su copago por los medicamentos por adelantado ya que esto es diferente dependiendo de la cobertura de su seguro. Sin embargo, es posible que podamos encontrar un medicamento sustituto a Electrical engineer un formulario para que el seguro cubra el medicamento que se considera necesario.   Si se requiere una autorizacin previa para que su compaa de seguros [place] su medicamento, por favor permtanos de 1 a 2 das hbiles para completar este proceso.  Los precios de los medicamentos varan con frecuencia dependiendo del Environmental consultant de dnde se surte la receta y alguna farmacias pueden ofrecer precios ms baratos.  El sitio web www.goodrx.com tiene cupones para medicamentos de Airline pilot. Los precios aqu no tienen en cuenta lo que podra costar con la ayuda del seguro (puede ser ms barato con su seguro), [employer]  el sitio web puede darle el precio si no Field seismologist.  - Puede imprimir el cupn correspondiente y llevarlo con su receta a la farmacia.  - Tambin puede pasar por nuestra oficina durante el horario de atencin regular y Charity fundraiser una tarjeta de cupones de GoodRx.  - Si  necesita que su receta se enve electrnicamente a una farmacia diferente, informe a nuestra oficina a travs de MyChart de [place] o por telfono llamando al [phone] y presione la opcin 4.

## 2022-02-16 ENCOUNTER — Telehealth (HOSPITAL_COMMUNITY): Payer: Self-pay | Admitting: *Deleted

## 2022-02-16 NOTE — Telephone Encounter (Signed)
Reaching out to patient to offer assistance regarding upcoming cardiac imaging study; pt verbalizes understanding of appt date/time, parking situation and where to check in, pre-test NPO status and medications ordered, and verified current allergies; name and call back number provided for further questions should they arise  Gordy Clement RN Navigator Cardiac [place] [facility] [phone] office [phone] cell  Patient reports HR is typically in the 50's. Advised to only take '50mg'$  metoprolol tartrate TWO ours prior only if her HR is greater than 65bpm.

## 2022-02-18 ENCOUNTER — Encounter: Payer: Self-pay | Admitting: Dermatology

## 2022-02-19 ENCOUNTER — Ambulatory Visit
Admission: RE | Admit: 2022-02-19 | Discharge: 2022-02-19 | Disposition: A | Discharge status: Home / Self Care | Payer: PPO | Source: Ambulatory Visit | Attending: Cardiovascular Disease | Admitting: Cardiovascular Disease

## 2022-02-19 ENCOUNTER — Telehealth: Payer: Self-pay

## 2022-02-19 DIAGNOSIS — I209 Angina pectoris, unspecified: Secondary | ICD-10-CM | POA: Insufficient documentation

## 2022-02-19 DIAGNOSIS — R072 Precordial pain: Secondary | ICD-10-CM | POA: Diagnosis not present

## 2022-02-19 MED ORDER — IOHEXOL 350 MG/ML SOLN
100.0000 mL | Freq: Once | INTRAVENOUS | Status: AC | PRN
  Administered 2022-02-19: 80 mL via INTRAVENOUS

## 2022-02-19 MED ORDER — NITROGLYCERIN 0.4 MG SL SUBL
SUBLINGUAL_TABLET | SUBLINGUAL | Status: AC
  Filled 2022-02-19: qty 2

## 2022-02-19 MED ORDER — NITROGLYCERIN 0.4 MG SL SUBL
0.8000 mg | SUBLINGUAL_TABLET | SUBLINGUAL | Status: DC | PRN
  Administered 2022-02-19: 0.8 mg via SUBLINGUAL

## 2022-02-19 NOTE — Telephone Encounter (Signed)
-----   Message from Ralene Bathe, MD sent at 02/16/2022  6:22 PM EDT ----- Diagnosis 1. Skin , right anterior ankle WELL DIFFERENTIATED SQUAMOUS CELL CARCINOMA 2. Skin , right medial lower leg above ankle inferior SQUAMOUS CELL CARCINOMA IN SITU, HYPERTROPHIC, BASE INVOLVED 3. Skin , right medial lower leg above ankle superior HYPERTROPHIC LICHENOID TISSUE REACTION WITH SQUAMOUS ATYPIA, SEE DESCRIPTION  1-cancer-SCC Already treated 2-cancer-SCC I-S Superficial Scheduled for treatment (EDC) 3-inflammation with squamous atypia Most consistent with early evolving squamous cell carcinoma /cancer Schedule for treatment (EDC)

## 2022-02-19 NOTE — Telephone Encounter (Signed)
Patient informed of pathology results. She is already scheduled for 03/15/22 and we can treat the other two SCCs at that time.

## 2022-02-19 NOTE — Progress Notes (Signed)
Patient tolerated procedure well. Ambulate w/o difficulty. Denies any lightheadedness or being dizzy. Pt denies any pain at this time. Sitting in chair, drinking water provided. Pt is encouraged to drink additional water throughout the day and reason explained to patient. Patient verbalized understanding and all questions answered. ABC intact. No further needs at this time. Discharge from procedure area w/o issues.  

## 2022-02-20 ENCOUNTER — Telehealth: Payer: Self-pay | Admitting: Emergency Medicine

## 2022-02-20 MED ORDER — ROSUVASTATIN CALCIUM 10 MG PO TABS: 10.0000 mg | ORAL_TABLET | Freq: Every day | ORAL | 3 refills | Status: DC

## 2022-02-20 NOTE — Telephone Encounter (Signed)
Called patient, went over results and recommendations. Pt verbalized understanding, and questions, if any, were answered.   Updated Crestor dose sent into patient's pharmacy.

## 2022-02-20 NOTE — Telephone Encounter (Signed)
-----   Message from Minna Merritts, MD sent at 02/20/2022  9:14 AM EDT ----- Cardiac CTA Nonobstructive disease noted, no severe stenosis warranting cardiac catheterization To slow any progression of calcium disease that is there, would recommend going up on the Crestor from 5 up to 10 Goal LDL less than 70

## 2022-02-21 ENCOUNTER — Telehealth: Payer: Self-pay

## 2022-02-21 DIAGNOSIS — C4492 Squamous cell carcinoma of skin, unspecified: Secondary | ICD-10-CM

## 2022-02-21 NOTE — Telephone Encounter (Signed)
Patient came in this morning request referral to [facility] to treat the most recent places near ankle that was biopsied last week. She states she is not second guessing Dr. Alveria Apley opinions or practice but due to be cut on so much in the same area, she would like treatment with a higher cure rate.   Heather QuillSharee Clark  Phone: [phone] Fax: [phone]

## 2022-02-22 NOTE — Telephone Encounter (Signed)
Patient advised of information per Dr. Nehemiah Massed and referral sent to [facility]. aw

## 2022-02-23 ENCOUNTER — Other Ambulatory Visit: Payer: Self-pay | Admitting: Physician Assistant

## 2022-02-23 ENCOUNTER — Other Ambulatory Visit: Payer: Self-pay | Admitting: Cardiovascular Disease

## 2022-02-23 ENCOUNTER — Other Ambulatory Visit: Payer: Self-pay

## 2022-02-23 DIAGNOSIS — Z1231 Encounter for screening mammogram for malignant neoplasm of breast: Secondary | ICD-10-CM

## 2022-02-23 MED ORDER — FLECAINIDE ACETATE 100 MG PO TABS: 100.0000 mg | ORAL_TABLET | Freq: Two times a day (BID) | ORAL | 3 refills | Status: DC

## 2022-03-12 ENCOUNTER — Telehealth: Payer: Self-pay | Admitting: Cardiovascular Disease

## 2022-03-12 DIAGNOSIS — I480 Paroxysmal atrial fibrillation: Secondary | ICD-10-CM

## 2022-03-12 MED ORDER — RIVAROXABAN 20 MG PO TABS: ORAL_TABLET | ORAL | 1 refills | Status: DC

## 2022-03-12 NOTE — Telephone Encounter (Signed)
Xarelto '20mg'$  refill request received. Pt is 76 years old, weight-72.3kg, Crea-0.83 on 02/14/2022, last seen by Dr. Rockey Situ on 02/12/2022, Diagnosis-Afib, [place].31m/min; Dose is appropriate based on dosing criteria. Will send in refill to requested pharmacy.

## 2022-03-12 NOTE — Telephone Encounter (Signed)
Refill request

## 2022-03-12 NOTE — Telephone Encounter (Signed)
*  STAT* If patient is at the pharmacy, call can be transferred to refill team.   1. Which medications need to be refilled? (please list name of each medication and dose if known)   rivaroxaban (XARELTO) 20 MG TABS tablet  2. Which pharmacy/location (including street and city if local pharmacy) is medication to be sent to?  [place], [place]  3. Do they need a 30 day or 90 day supply?  90 day supply  Patient called stating she has 3 tablets left.

## 2022-03-15 ENCOUNTER — Ambulatory Visit: Payer: PPO | Admitting: Dermatology

## 2022-03-15 DIAGNOSIS — Z711 Person with feared health complaint in whom no diagnosis is made: Secondary | ICD-10-CM | POA: Diagnosis not present

## 2022-03-15 DIAGNOSIS — D099 Carcinoma in situ, unspecified: Secondary | ICD-10-CM | POA: Diagnosis not present

## 2022-03-22 ENCOUNTER — Ambulatory Visit
Admission: RE | Admit: 2022-03-22 | Discharge: 2022-03-22 | Disposition: A | Discharge status: Home / Self Care | Payer: PPO | Source: Ambulatory Visit | Attending: Physician Assistant | Admitting: Physician Assistant

## 2022-03-22 DIAGNOSIS — Z1231 Encounter for screening mammogram for malignant neoplasm of breast: Secondary | ICD-10-CM | POA: Diagnosis not present

## 2022-03-22 DIAGNOSIS — H04123 Dry eye syndrome of bilateral lacrimal glands: Secondary | ICD-10-CM | POA: Diagnosis not present

## 2022-03-22 DIAGNOSIS — H18413 Arcus senilis, bilateral: Secondary | ICD-10-CM | POA: Diagnosis not present

## 2022-03-23 ENCOUNTER — Inpatient Hospital Stay
Admission: RE | Admit: 2022-03-23 | Discharge: 2022-03-23 | Disposition: A | Discharge status: Home / Self Care | Payer: Self-pay | Source: Ambulatory Visit | Attending: *Deleted | Admitting: *Deleted

## 2022-03-23 ENCOUNTER — Other Ambulatory Visit: Payer: Self-pay | Admitting: *Deleted

## 2022-03-23 DIAGNOSIS — Z1231 Encounter for screening mammogram for malignant neoplasm of breast: Secondary | ICD-10-CM

## 2022-04-02 ENCOUNTER — Other Ambulatory Visit: Payer: Self-pay

## 2022-04-02 ENCOUNTER — Other Ambulatory Visit: Payer: Self-pay | Admitting: Cardiovascular Disease

## 2022-04-02 ENCOUNTER — Encounter: Payer: Self-pay | Admitting: Cardiovascular Disease

## 2022-04-02 MED ORDER — ROSUVASTATIN CALCIUM 10 MG PO TABS: 10.0000 mg | ORAL_TABLET | Freq: Every day | ORAL | 2 refills | Status: DC

## 2022-04-02 NOTE — Telephone Encounter (Signed)
error 

## 2022-04-20 DIAGNOSIS — H04123 Dry eye syndrome of bilateral lacrimal glands: Secondary | ICD-10-CM | POA: Diagnosis not present

## 2022-04-20 DIAGNOSIS — H18422 Band keratopathy, left eye: Secondary | ICD-10-CM | POA: Diagnosis not present

## 2022-04-20 DIAGNOSIS — H18413 Arcus senilis, bilateral: Secondary | ICD-10-CM | POA: Diagnosis not present

## 2022-05-03 DIAGNOSIS — H18422 Band keratopathy, left eye: Secondary | ICD-10-CM | POA: Diagnosis not present

## 2022-05-09 DIAGNOSIS — H18422 Band keratopathy, left eye: Secondary | ICD-10-CM | POA: Diagnosis not present

## 2022-05-15 ENCOUNTER — Telehealth: Payer: Self-pay | Admitting: Cardiovascular Disease

## 2022-05-15 NOTE — Telephone Encounter (Signed)
Pt c/o medication issue:  1. Name of Medication:   rivaroxaban (XARELTO) 20 MG TABS tablet  2. How are you currently taking this medication (dosage and times per day)?   As prescribed  3. Are you having a reaction (difficulty breathing--STAT)?   No  4. What is your medication issue?   Patient would like to get samples of this medication as she is in the donut hole.  Patient stated she will be dropping off handicap parking paperwork today and would like to pick up mediation then.

## 2022-05-16 DIAGNOSIS — H1589 Other disorders of sclera: Secondary | ICD-10-CM | POA: Diagnosis not present

## 2022-05-16 NOTE — Telephone Encounter (Signed)
Attempted to call the patient. No answer- I left a detailed message (ok per DPR), on her voice mail that I have pulled 2 weeks worth of Xarelto 20 mg samples for her.  I also advised that I am including a card for her to review for [facility] that has steps to assist with possibly getting her RX for $85 through the donut hole period.  I asked that she review this and call back with any questions.   Samples Given: Xarelto 20 mg Lot: [id number] Exp: 04/2023 # 2 bottles

## 2022-05-17 ENCOUNTER — Telehealth: Payer: Self-pay | Admitting: Cardiovascular Disease

## 2022-05-17 NOTE — Telephone Encounter (Signed)
The patient brought an application for renewal of disability parking placard or Dr. Rockey Situ to sign.  The form was obtained from nurse bin and placed on Dr. Donivan Scull desk for further review/ signature.

## 2022-05-21 NOTE — Telephone Encounter (Signed)
Dr. Rockey Situ has signed the patient's Disability Parking Placard.  I called and notified the patient that her completed form is ready for pick up at our front desk.  The patient voices understanding and is agreeable.  She was very appreciative of the call.

## 2022-05-23 DIAGNOSIS — H5989 Other postprocedural complications and disorders of eye and adnexa, not elsewhere classified: Secondary | ICD-10-CM | POA: Diagnosis not present

## 2022-05-23 DIAGNOSIS — H1589 Other disorders of sclera: Secondary | ICD-10-CM | POA: Diagnosis not present

## 2022-05-24 ENCOUNTER — Other Ambulatory Visit: Payer: Self-pay

## 2022-05-24 DIAGNOSIS — Z1231 Encounter for screening mammogram for malignant neoplasm of breast: Secondary | ICD-10-CM

## 2022-05-24 DIAGNOSIS — M816 Localized osteoporosis [Lequesne]: Secondary | ICD-10-CM | POA: Diagnosis not present

## 2022-05-24 DIAGNOSIS — M545 Low back pain, unspecified: Secondary | ICD-10-CM | POA: Diagnosis not present

## 2022-05-24 DIAGNOSIS — M5416 Radiculopathy, lumbar region: Secondary | ICD-10-CM | POA: Diagnosis not present

## 2022-05-24 DIAGNOSIS — K52831 Collagenous colitis: Secondary | ICD-10-CM | POA: Diagnosis not present

## 2022-05-24 DIAGNOSIS — E782 Mixed hyperlipidemia: Secondary | ICD-10-CM | POA: Diagnosis not present

## 2022-05-24 DIAGNOSIS — M47816 Spondylosis without myelopathy or radiculopathy, lumbar region: Secondary | ICD-10-CM | POA: Diagnosis not present

## 2022-05-24 DIAGNOSIS — Z131 Encounter for screening for diabetes mellitus: Secondary | ICD-10-CM | POA: Diagnosis not present

## 2022-05-24 DIAGNOSIS — F419 Anxiety disorder, unspecified: Secondary | ICD-10-CM | POA: Diagnosis not present

## 2022-05-24 DIAGNOSIS — Z0000 Encounter for general adult medical examination without abnormal findings: Secondary | ICD-10-CM | POA: Diagnosis not present

## 2022-05-24 DIAGNOSIS — Z23 Encounter for immunization: Secondary | ICD-10-CM | POA: Diagnosis not present

## 2022-05-24 DIAGNOSIS — I482 Chronic atrial fibrillation, unspecified: Secondary | ICD-10-CM | POA: Diagnosis not present

## 2022-05-28 DIAGNOSIS — S0502XD Injury of conjunctiva and corneal abrasion without foreign body, left eye, subsequent encounter: Secondary | ICD-10-CM | POA: Diagnosis not present

## 2022-05-30 DIAGNOSIS — H1589 Other disorders of sclera: Secondary | ICD-10-CM | POA: Diagnosis not present

## 2022-05-30 DIAGNOSIS — S0502XD Injury of conjunctiva and corneal abrasion without foreign body, left eye, subsequent encounter: Secondary | ICD-10-CM | POA: Diagnosis not present

## 2022-05-31 DIAGNOSIS — M810 Age-related osteoporosis without current pathological fracture: Secondary | ICD-10-CM | POA: Diagnosis not present

## 2022-06-07 ENCOUNTER — Ambulatory Visit: Payer: PPO | Admitting: Dermatology

## 2022-06-07 DIAGNOSIS — Z79899 Other long term (current) drug therapy: Secondary | ICD-10-CM | POA: Diagnosis not present

## 2022-06-07 DIAGNOSIS — L570 Actinic keratosis: Secondary | ICD-10-CM | POA: Diagnosis not present

## 2022-06-07 DIAGNOSIS — L578 Other skin changes due to chronic exposure to nonionizing radiation: Secondary | ICD-10-CM

## 2022-06-07 DIAGNOSIS — Z5111 Encounter for antineoplastic chemotherapy: Secondary | ICD-10-CM | POA: Diagnosis not present

## 2022-06-07 DIAGNOSIS — L820 Inflamed seborrheic keratosis: Secondary | ICD-10-CM

## 2022-06-07 DIAGNOSIS — L821 Other seborrheic keratosis: Secondary | ICD-10-CM | POA: Diagnosis not present

## 2022-06-07 NOTE — Patient Instructions (Addendum)
Actinic keratoses are precancerous spots that appear secondary to cumulative UV radiation exposure/sun exposure over time. They are chronic with expected duration over 1 year. A portion of actinic keratoses will progress to squamous cell carcinoma of the skin. It is not possible to reliably predict which spots will progress to skin cancer and so treatment is recommended to prevent development of skin cancer.  Recommend daily broad spectrum sunscreen SPF 30+ to sun-exposed areas, reapply every 2 hours as needed.  Recommend staying in the shade or wearing long sleeves, sun glasses (UVA+UVB protection) and wide brim hats (4-inch brim around the entire circumference of the hat). Call for new or changing lesions.    Cryotherapy Aftercare  Wash gently with soap and water everyday.   Apply Vaseline and Band-Aid daily until healed.      Seborrheic Keratosis  What causes seborrheic keratoses? Seborrheic keratoses are harmless, common skin growths that first appear during adult life.  As time goes by, more growths appear.  Some people may develop a large number of them.  Seborrheic keratoses appear on both covered and uncovered body parts.  They are not caused by sunlight.  The tendency to develop seborrheic keratoses can be inherited.  They vary in color from skin-colored to gray, brown, or even black.  They can be either smooth or have a rough, warty surface.   Seborrheic keratoses are superficial and look as if they were stuck on the skin.  Under the microscope this type of keratosis looks like layers upon layers of skin.  That is why at times the top layer may seem to fall off, but the rest of the growth remains and re-grows.    Treatment Seborrheic keratoses do not need to be treated, but can easily be removed in the office.  Seborrheic keratoses often cause symptoms when they rub on clothing or jewelry.  Lesions can be in the way of shaving.  If they become inflamed, they can cause itching,  soreness, or burning.  Removal of a seborrheic keratosis can be accomplished by freezing, burning, or surgery. If any spot bleeds, scabs, or grows rapidly, please return to have it checked, as these can be an indication of a skin cancer.   In 2 Months  Recommend restarting 5-fluorouracil/calcipotriene cream twice a day for 7 days to affected areas including legs  Instructions for [facility] Medications  One or more of your medications was sent to the [facility] mail order compounding pharmacy. You will receive an email from them and can purchase the medicine through that link. It will then be mailed to your home at the address you confirmed. If for any reason you do not receive an email from them, please check your spam folder. If you still do not find the email, please let us know. [facility] phone number is [phone].     5-Fluorouracil/Calcipotriene Patient Education   Actinic keratoses are the dry, red scaly spots on the skin caused by sun damage. A portion of these spots can turn into skin cancer with time, and treating them can help prevent development of skin cancer.   Treatment of these spots requires removal of the defective skin cells. There are various ways to remove actinic keratoses, including freezing with liquid nitrogen, treatment with creams, or treatment with a blue light procedure in the office.   5-fluorouracil cream is a topical cream used to treat actinic keratoses. It works by interfering with the growth of abnormal fast-growing skin cells, such as  actinic keratoses. These cells peel off and are replaced by healthy ones.   5-fluorouracil/calcipotriene is a combination of the 5-fluorouracil cream with a vitamin D analog cream called calcipotriene. The calcipotriene alone does not treat actinic keratoses. However, when it is combined with 5-fluorouracil, it helps the 5-fluorouracil treat the actinic keratoses much faster so that the same results can be  achieved with a much shorter treatment time.  INSTRUCTIONS FOR 5-FLUOROURACIL/CALCIPOTRIENE CREAM:   5-fluorouracil/calcipotriene cream typically only needs to be used for 4-7 days. A thin layer should be applied twice a day to the treatment areas recommended by your physician.   If your physician prescribed you separate tubes of 5-fluourouracil and calcipotriene, apply a thin layer of 5-fluorouracil followed by a thin layer of calcipotriene.   Avoid contact with your eyes, nostrils, and mouth. Do not use 5-fluorouracil/calcipotriene cream on infected or open wounds.   You will develop redness, irritation and some crusting at areas where you have pre-cancer damage/actinic keratoses. IF YOU DEVELOP PAIN, BLEEDING, OR SIGNIFICANT CRUSTING, STOP THE TREATMENT EARLY - you have already gotten a good response and the actinic keratoses should clear up well.  Wash your hands after applying 5-fluorouracil 5% cream on your skin.   A moisturizer or sunscreen with a minimum SPF 30 should be applied each morning.   Once you have finished the treatment, you can apply a thin layer of Vaseline twice a day to irritated areas to soothe and calm the areas more quickly. If you experience significant discomfort, contact your physician.  For some patients it is necessary to repeat the treatment for best results.  SIDE EFFECTS: When using 5-fluorouracil/calcipotriene cream, you may have mild irritation, such as redness, dryness, swelling, or a mild burning sensation. This usually resolves within 2 weeks. The more actinic keratoses you have, the more redness and inflammation you can expect during treatment. Eye irritation has been reported rarely. If this occurs, please let us know.  If you have any trouble using this cream, please call the office. If you have any other questions about this information, please do not hesitate to ask me before you leave the office..   Reviewed course of treatment and expected  reaction.  Patient advised to expect inflammation and crusting and advised that erosions are possible.  Patient advised to be diligent with sun protection during and after treatment. Counseled to keep medication out of reach of children and pets.      Due to recent changes in healthcare laws, you may see results of your pathology and/or laboratory studies on MyChart before the doctors have had a chance to review them. We understand that in some cases there may be results that are confusing or concerning to you. Please understand that not all results are received at the same time and often the doctors may need to interpret multiple results in order to provide you with the best plan of care or course of treatment. Therefore, we ask that you please give [place] 2 business days to thoroughly review all your results before contacting the office for clarification. Should we see a critical lab result, you will be contacted sooner.   If You Need Anything After Your Visit  If you have any questions or concerns for your doctor, please call our main line at [phone] and press option 4 to reach your doctor's medical assistant. If no one answers, please leave a voicemail as directed and we will return your call as soon as possible. Messages left after 4  pm will be answered the following business day.   You may also send [place] a message via [place]. We typically respond to MyChart messages within 1-2 business days.  For prescription refills, please ask your pharmacy to contact our office. Our fax number is [phone].  If you have an urgent issue when the clinic is closed that cannot wait until the next business day, you can page your doctor at the number below.    Please note that while we do our best to be available for urgent issues outside of office hours, we are not available 24/7.   If you have an urgent issue and are unable to reach [place], you may choose to seek medical care at your doctor's office, retail  clinic, urgent care center, or emergency room.  If you have a medical emergency, please immediately call 911 or go to the emergency department.  Pager Numbers  - Dr. Nehemiah Massed: [phone]  - Dr. Laurence Ferrari: [phone]  - Dr. Nicole Kindred: [phone]  In the event of inclement weather, please call our main line at [phone] for an update on the status of any delays or closures.  Dermatology Medication Tips: Please keep the boxes that topical medications come in in order to help keep track of the instructions about where and how to use these. Pharmacies typically print the medication instructions only on the boxes and not directly on the medication tubes.   If your medication is too expensive, please contact our office at [phone] option 4 or send [place] a message through [place].   We are unable to tell what your co-pay for medications will be in advance as this is different depending on your insurance coverage. However, we may be able to find a substitute medication at lower cost or fill out paperwork to get insurance to cover a needed medication.   If a prior authorization is required to get your medication covered by your insurance company, please allow [place] 1-2 business days to complete this process.  Drug prices often vary depending on where the prescription is filled and some pharmacies may offer cheaper prices.  The website www.goodrx.com contains coupons for medications through different pharmacies. The prices here do not account for what the cost may be with help from insurance (it may be cheaper with your insurance), but the website can give you the price if you did not use any insurance.  - You can print the associated coupon and take it with your prescription to the pharmacy.  - You may also stop by our office during regular business hours and pick up a GoodRx coupon card.  - If you need your prescription sent electronically to a different pharmacy, notify our office through [facility] or by phone at [phone] option 4.     Si Usted Necesita Algo Despus de Su Visita  Tambin puede enviarnos un mensaje a travs de Pharmacist, community. Por lo general respondemos a los mensajes de MyChart en el transcurso de 1 a 2 das hbiles.  Para renovar recetas, por favor pida a su farmacia que se ponga en contacto con nuestra oficina. Harland Dingwall de fax es [place] [phone].  Si tiene un asunto urgente cuando la clnica est cerrada y que no puede esperar hasta el siguiente da hbil, puede llamar/localizar a su doctor(a) al nmero que aparece a continuacin.   Por favor, tenga en cuenta que aunque hacemos todo lo posible para estar disponibles para asuntos urgentes fuera del horario de oficina, no estamos disponibles las 24  horas del Training and development officer, los 7 das de la [place].   Si tiene un problema urgente y no puede comunicarse con nosotros, puede optar por buscar atencin mdica  en el consultorio de su doctor(a), en una clnica privada, en un centro de atencin urgente o en una sala de emergencias.  Si tiene Engineering geologist, por favor llame inmediatamente al 911 o vaya a la sala de emergencias.  Nmeros de bper  - Dr. Nehemiah Massed: [phone]  - Dra. Moye: [phone]  - Dra. Nicole Kindred: [phone]  En caso de inclemencias del [place], por favor llame a Johnsie Kindred principal al [phone] para una actualizacin sobre el [place] de cualquier retraso o cierre.  Consejos para la medicacin en dermatologa: Por favor, guarde las cajas en las que vienen los medicamentos de uso tpico para ayudarle a seguir las instrucciones sobre dnde y cmo usarlos. Las farmacias generalmente imprimen las instrucciones del medicamento slo en las cajas y no directamente en los tubos del [place].   Si su medicamento es muy caro, por favor, pngase en contacto con Zigmund Daniel llamando al [phone] y presione la opcin 4 o envenos un mensaje a travs de Pharmacist, community.   No podemos decirle  cul ser su copago por los medicamentos por adelantado ya que esto es diferente dependiendo de la cobertura de su seguro. Sin embargo, es posible que podamos encontrar un medicamento sustituto a Electrical engineer un formulario para que el seguro cubra el medicamento que se considera necesario.   Si se requiere una autorizacin previa para que su compaa de seguros [place] su medicamento, por favor permtanos de 1 a 2 das hbiles para completar este proceso.  Los precios de los medicamentos varan con frecuencia dependiendo del Environmental consultant de dnde se surte la receta y alguna farmacias pueden ofrecer precios ms baratos.  El sitio web www.goodrx.com tiene cupones para medicamentos de Airline pilot. Los precios aqu no tienen en cuenta lo que podra costar con la ayuda del seguro (puede ser ms barato con su seguro), pero el sitio web puede darle el precio si no utiliz Research scientist (physical sciences).  - Puede imprimir el cupn correspondiente y llevarlo con su receta a la farmacia.  - Tambin puede pasar por nuestra oficina durante el horario de atencin regular y Charity fundraiser una tarjeta de cupones de GoodRx.  - Si necesita que su receta se enve electrnicamente a una farmacia diferente, informe a nuestra oficina a travs de MyChart de [place] o por telfono llamando al [phone] y presione la opcin 4.

## 2022-06-07 NOTE — Progress Notes (Signed)
Follow-Up Visit   Subjective  [name] is a 76 y.o. female who presents for the following: Skin Problem (Patient here today concern a spot on both legs. Patient has history of aks and used topical 5 f/u cream ). The patient has spots, moles and lesions to be evaluated, some may be new or changing and the patient has concerns that these could be cancer.  The following portions of the chart were reviewed this encounter and updated as appropriate:  Tobacco  Allergies  Meds  Problems  Med Hx  Surg Hx  Fam Hx     Review of Systems: No other skin or systemic complaints except as noted in HPI or Assessment and Plan.  Objective  Well appearing patient in no apparent distress; mood and affect are within normal limits.  A focused examination was performed including b/l legs. Relevant physical exam findings are noted in the Assessment and Plan.  b/l legs x10 (10) Erythematous thin papules/macules with gritty scale.   b/l lower legs x 20 (20) Erythematous stuck-on, waxy papule or plaque   Assessment & Plan  Actinic keratosis (10) b/l legs x10  Actinic keratoses are precancerous spots that appear secondary to cumulative UV radiation exposure/sun exposure over time. They are chronic with expected duration over 1 year. A portion of actinic keratoses will progress to squamous cell carcinoma of the skin. It is not possible to reliably predict which spots will progress to skin cancer and so treatment is recommended to prevent development of skin cancer.  Recommend daily broad spectrum sunscreen SPF 30+ to sun-exposed areas, reapply every 2 hours as needed.  Recommend staying in the shade or wearing long sleeves, sun glasses (UVA+UVB protection) and wide brim hats (4-inch brim around the entire circumference of the hat). Call for new or changing lesions.  Destruction of lesion - b/l legs x10 Complexity: simple   Destruction method: cryotherapy   Informed consent: discussed and consent  obtained   Timeout:  patient name, date of birth, surgical site, and procedure verified Lesion destroyed using liquid nitrogen: Yes   Region frozen until ice ball extended beyond lesion: Yes   Outcome: patient tolerated procedure well with no complications   Post-procedure details: wound care instructions given   Additional details:  Prior to procedure, discussed risks of blister formation, small wound, skin dyspigmentation, or rare scar following cryotherapy. Recommend Vaseline ointment to treated areas while healing.   Inflamed seborrheic keratosis (20) b/l lower legs x 20  Symptomatic, irritating, patient would like treated.  Destruction of lesion - b/l lower legs x 20 Complexity: simple   Destruction method: cryotherapy   Informed consent: discussed and consent obtained   Timeout:  patient name, date of birth, surgical site, and procedure verified Lesion destroyed using liquid nitrogen: Yes   Region frozen until ice ball extended beyond lesion: Yes   Outcome: patient tolerated procedure well with no complications   Post-procedure details: wound care instructions given    Seborrheic Keratoses - Stuck-on, waxy, tan-brown papules and/or plaques  - Benign-appearing - Discussed benign etiology and prognosis. - Observe - Call for any changes  Actinic Damage with PreCancerous Actinic Keratoses Counseling for Topical Chemotherapy Management: Patient exhibits: - Severe, confluent actinic changes with pre-cancerous actinic keratoses that is secondary to cumulative UV radiation exposure over time - Condition that is severe; chronic, not at goal. - diffuse scaly erythematous macules and papules with underlying dyspigmentation - Discussed Prescription "Field Treatment" topical Chemotherapy for Severe, Chronic Confluent Actinic Changes  with Pre-Cancerous Actinic Keratoses Field treatment involves treatment of an entire area of skin that has confluent Actinic Changes (Sun/ Ultraviolet  light damage) and PreCancerous Actinic Keratoses by method of PhotoDynamic Therapy (PDT) and/or prescription Topical Chemotherapy agents such as 5-fluorouracil, 5-fluorouracil/calcipotriene, and/or imiquimod.  The purpose is to decrease the number of clinically evident and subclinical PreCancerous lesions to prevent progression to development of skin cancer by chemically destroying early precancer changes that may or may not be visible.  It has been shown to reduce the risk of developing skin cancer in the treated area. As a result of treatment, redness, scaling, crusting, and open sores may occur during treatment course. One or more than one of these methods may be used and may have to be used several times to control, suppress and eliminate the PreCancerous changes. Discussed treatment course, expected reaction, and possible side effects. - Recommend daily broad spectrum sunscreen SPF 30+ to sun-exposed areas, reapply every 2 hours as needed.  - Staying in the shade or wearing long sleeves, sun glasses (UVA+UVB protection) and wide brim hats (4-inch brim around the entire circumference of the hat) are also recommended. - Call for new or changing lesions. Start in January  [facility] 5-fluorouracil/calcipotriene cream twice a day for 7 days to affected areas including lower legs below knees. Prescription sent to [facility]. Patient advised they will receive an email to purchase the medication online and have it sent to their home. Patient provided with handout reviewing treatment course and side effects and advised to call or message [place] on MyChart with any concerns.  Reviewed course of treatment and expected reaction.  Patient advised to expect inflammation and crusting and advised that erosions are possible.  Patient advised to be diligent with sun protection during and after treatment. Counseled to keep medication out of reach of children and pets.  Actinic Damage - chronic,  secondary to cumulative UV radiation exposure/sun exposure over time - diffuse scaly erythematous macules with underlying dyspigmentation - Recommend daily broad spectrum sunscreen SPF 30+ to sun-exposed areas, reapply every 2 hours as needed.  - Recommend staying in the shade or wearing long sleeves, sun glasses (UVA+UVB protection) and wide brim hats (4-inch brim around the entire circumference of the hat). - Call for new or changing lesions.  Return for ak follow up no later than april . IRuthell Rummage, CMA, am acting as scribe for Sarina Ser, MD. Documentation: I have reviewed the above documentation for accuracy and completeness, and I agree with the above.  Sarina Ser, MD

## 2022-06-13 ENCOUNTER — Telehealth: Payer: Self-pay

## 2022-06-13 DIAGNOSIS — M5136 Other intervertebral disc degeneration, lumbar region: Secondary | ICD-10-CM | POA: Diagnosis not present

## 2022-06-13 DIAGNOSIS — M461 Sacroiliitis, not elsewhere classified: Secondary | ICD-10-CM | POA: Diagnosis not present

## 2022-06-13 DIAGNOSIS — H5989 Other postprocedural complications and disorders of eye and adnexa, not elsewhere classified: Secondary | ICD-10-CM | POA: Diagnosis not present

## 2022-06-13 DIAGNOSIS — M810 Age-related osteoporosis without current pathological fracture: Secondary | ICD-10-CM | POA: Diagnosis not present

## 2022-06-13 DIAGNOSIS — H1589 Other disorders of sclera: Secondary | ICD-10-CM | POA: Diagnosis not present

## 2022-06-13 MED ORDER — DOXYCYCLINE HYCLATE 100 MG PO TABS: 100.0000 mg | ORAL_TABLET | Freq: Two times a day (BID) | ORAL | 0 refills | Status: AC

## 2022-06-13 MED ORDER — MUPIROCIN 2 % EX OINT: 1.0000 | TOPICAL_OINTMENT | CUTANEOUS | 0 refills | Status: DC

## 2022-06-13 NOTE — Telephone Encounter (Signed)
Patient called office stating the areas on her ankle are possibly infected from LN2 treatment last week. She is scheduled to have a eye transplant next Tuesday. I have called and spoke with Dr. Nehemiah Massed and he Oked patient to start Doxycycline '100mg'$  BID for 10 days and Mupirocin QD-TID to affected areas. RX sent in and patient advised. aw

## 2022-06-19 DIAGNOSIS — T81509A Unspecified complication of foreign body accidentally left in body following unspecified procedure, initial encounter: Secondary | ICD-10-CM | POA: Diagnosis not present

## 2022-06-19 DIAGNOSIS — Z79899 Other long term (current) drug therapy: Secondary | ICD-10-CM | POA: Diagnosis not present

## 2022-06-19 DIAGNOSIS — Z7901 Long term (current) use of anticoagulants: Secondary | ICD-10-CM | POA: Diagnosis not present

## 2022-06-19 DIAGNOSIS — Z7983 Long term (current) use of bisphosphonates: Secondary | ICD-10-CM | POA: Diagnosis not present

## 2022-06-19 DIAGNOSIS — Z885 Allergy status to narcotic agent status: Secondary | ICD-10-CM | POA: Diagnosis not present

## 2022-06-19 DIAGNOSIS — Z87891 Personal history of nicotine dependence: Secondary | ICD-10-CM | POA: Diagnosis not present

## 2022-06-19 DIAGNOSIS — F419 Anxiety disorder, unspecified: Secondary | ICD-10-CM | POA: Diagnosis not present

## 2022-06-19 DIAGNOSIS — K219 Gastro-esophageal reflux disease without esophagitis: Secondary | ICD-10-CM | POA: Diagnosis not present

## 2022-06-19 DIAGNOSIS — F32A Depression, unspecified: Secondary | ICD-10-CM | POA: Diagnosis not present

## 2022-06-19 DIAGNOSIS — I10 Essential (primary) hypertension: Secondary | ICD-10-CM | POA: Diagnosis not present

## 2022-06-19 DIAGNOSIS — I4891 Unspecified atrial fibrillation: Secondary | ICD-10-CM | POA: Diagnosis not present

## 2022-06-19 DIAGNOSIS — E785 Hyperlipidemia, unspecified: Secondary | ICD-10-CM | POA: Diagnosis not present

## 2022-06-19 DIAGNOSIS — H1589 Other disorders of sclera: Secondary | ICD-10-CM | POA: Diagnosis not present

## 2022-06-19 DIAGNOSIS — H5712 Ocular pain, left eye: Secondary | ICD-10-CM | POA: Diagnosis not present

## 2022-06-19 DIAGNOSIS — Z792 Long term (current) use of antibiotics: Secondary | ICD-10-CM | POA: Diagnosis not present

## 2022-06-19 DIAGNOSIS — H5989 Other postprocedural complications and disorders of eye and adnexa, not elsewhere classified: Secondary | ICD-10-CM | POA: Diagnosis not present

## 2022-06-26 ENCOUNTER — Encounter: Payer: Self-pay | Admitting: Dermatology

## 2022-07-15 ENCOUNTER — Other Ambulatory Visit: Payer: Self-pay | Admitting: Cardiovascular Disease

## 2022-07-26 ENCOUNTER — Ambulatory Visit (INDEPENDENT_AMBULATORY_CARE_PROVIDER_SITE_OTHER): Payer: PPO | Admitting: Dermatology

## 2022-07-26 DIAGNOSIS — L821 Other seborrheic keratosis: Secondary | ICD-10-CM

## 2022-07-26 DIAGNOSIS — L820 Inflamed seborrheic keratosis: Secondary | ICD-10-CM | POA: Diagnosis not present

## 2022-07-26 DIAGNOSIS — Z872 Personal history of diseases of the skin and subcutaneous tissue: Secondary | ICD-10-CM

## 2022-07-26 DIAGNOSIS — L578 Other skin changes due to chronic exposure to nonionizing radiation: Secondary | ICD-10-CM | POA: Diagnosis not present

## 2022-07-26 DIAGNOSIS — C44729 Squamous cell carcinoma of skin of left lower limb, including hip: Secondary | ICD-10-CM

## 2022-07-26 DIAGNOSIS — D492 Neoplasm of unspecified behavior of bone, soft tissue, and skin: Secondary | ICD-10-CM

## 2022-07-26 DIAGNOSIS — Z85828 Personal history of other malignant neoplasm of skin: Secondary | ICD-10-CM

## 2022-07-26 DIAGNOSIS — Z8589 Personal history of malignant neoplasm of other organs and systems: Secondary | ICD-10-CM

## 2022-07-26 MED ORDER — MUPIROCIN 2 % EX OINT: 1.0000 | TOPICAL_OINTMENT | Freq: Every day | CUTANEOUS | 1 refills | Status: DC

## 2022-07-26 NOTE — Patient Instructions (Addendum)
Wound Care Instructions  Cleanse wound gently with soap and water once a day then pat dry with clean gauze. Apply a thin coat of Petrolatum (petroleum jelly, "Vaseline") over the wound (unless you have an allergy to this). We recommend that you use a new, sterile tube of Vaseline. Do not pick or remove scabs. Do not remove the yellow or white "healing tissue" from the base of the wound.  Cover the wound with fresh, clean, nonstick gauze and secure with paper tape. You may use Band-Aids in place of gauze and tape if the wound is small enough, but would recommend trimming much of the tape off as there is often too much. Sometimes Band-Aids can irritate the skin.  You should call the office for your biopsy report after 1 week if you have not already been contacted.  If you experience any problems, such as abnormal amounts of bleeding, swelling, significant bruising, significant pain, or evidence of infection, please call the office immediately.  FOR ADULT SURGERY PATIENTS: If you need something for pain relief you may take 1 extra strength Tylenol (acetaminophen) AND 2 Ibuprofen (200mg each) together every 4 hours as needed for pain. (do not take these if you are allergic to them or if you have a reason you should not take them.) Typically, you may only need pain medication for 1 to 3 days.     Due to recent changes in healthcare laws, you may see results of your pathology and/or laboratory studies on MyChart before the doctors have had a chance to review them. We understand that in some cases there may be results that are confusing or concerning to you. Please understand that not all results are received at the same time and often the doctors may need to interpret multiple results in order to provide you with the best plan of care or course of treatment. Therefore, we ask that you please give us 2 business days to thoroughly review all your results before contacting the office for clarification. Should  we see a critical lab result, you will be contacted sooner.   If You Need Anything After Your Visit  If you have any questions or concerns for your doctor, please call our main line at [phone] and press option 4 to reach your doctor's medical assistant. If no one answers, please leave a voicemail as directed and we will return your call as soon as possible. Messages left after 4 pm will be answered the following business day.   You may also send us a message via MyChart. We typically respond to MyChart messages within 1-2 business days.  For prescription refills, please ask your pharmacy to contact our office. Our fax number is [phone].  If you have an urgent issue when the clinic is closed that cannot wait until the next business day, you can page your doctor at the number below.    Please note that while we do our best to be available for urgent issues outside of office hours, we are not available 24/7.   If you have an urgent issue and are unable to reach us, you may choose to seek medical care at your doctor's office, retail clinic, urgent care center, or emergency room.  If you have a medical emergency, please immediately call 911 or go to the emergency department.  Pager Numbers  - Dr. Kowalski: [phone]  - Dr. Moye: [phone]  - Dr. Stewart: [phone]  In the event of inclement weather, please call our main line at   [phone] for an update on the status of any delays or closures.  Dermatology Medication Tips: Please keep the boxes that topical medications come in in order to help keep track of the instructions about where and how to use these. Pharmacies typically print the medication instructions only on the boxes and not directly on the medication tubes.   If your medication is too expensive, please contact our office at [phone] option 4 or send us a message through MyChart.   We are unable to tell what your co-pay for medications will be in  advance as this is different depending on your insurance coverage. However, we may be able to find a substitute medication at lower cost or fill out paperwork to get insurance to cover a needed medication.   If a prior authorization is required to get your medication covered by your insurance company, please allow us 1-2 business days to complete this process.  Drug prices often vary depending on where the prescription is filled and some pharmacies may offer cheaper prices.  The website www.goodrx.com contains coupons for medications through different pharmacies. The prices here do not account for what the cost may be with help from insurance (it may be cheaper with your insurance), but the website can give you the price if you did not use any insurance.  - You can print the associated coupon and take it with your prescription to the pharmacy.  - You may also stop by our office during regular business hours and pick up a GoodRx coupon card.  - If you need your prescription sent electronically to a different pharmacy, notify our office through [facility] MyChart or by phone at [phone] option 4.     Si Usted Necesita Algo Despus de Su Visita  Tambin puede enviarnos un mensaje a travs de MyChart. Por lo general respondemos a los mensajes de MyChart en el transcurso de 1 a 2 das hbiles.  Para renovar recetas, por favor pida a su farmacia que se ponga en contacto con nuestra oficina. Nuestro nmero de fax es el [phone].  Si tiene un asunto urgente cuando la clnica est cerrada y que no puede esperar hasta el siguiente da hbil, puede llamar/localizar a su doctor(a) al nmero que aparece a continuacin.   Por favor, tenga en cuenta que aunque hacemos todo lo posible para estar disponibles para asuntos urgentes fuera del horario de oficina, no estamos disponibles las 24 horas del da, los 7 das de la semana.   Si tiene un problema urgente y no puede comunicarse con nosotros, puede  optar por buscar atencin mdica  en el consultorio de su doctor(a), en una clnica privada, en un centro de atencin urgente o en una sala de emergencias.  Si tiene una emergencia mdica, por favor llame inmediatamente al 911 o vaya a la sala de emergencias.  Nmeros de bper  - Dr. Kowalski: [phone]  - Dra. Moye: [phone]  - Dra. Stewart: [phone]  En caso de inclemencias del tiempo, por favor llame a nuestra lnea principal al [phone] para una actualizacin sobre el estado de cualquier retraso o cierre.  Consejos para la medicacin en dermatologa: Por favor, guarde las cajas en las que vienen los medicamentos de uso tpico para ayudarle a seguir las instrucciones sobre dnde y cmo usarlos. Las farmacias generalmente imprimen las instrucciones del medicamento slo en las cajas y no directamente en los tubos del medicamento.   Si su medicamento es muy caro, por favor, pngase en contacto con   nuestra oficina llamando al [phone] y presione la opcin 4 o envenos un mensaje a travs de MyChart.   No podemos decirle cul ser su copago por los medicamentos por adelantado ya que esto es diferente dependiendo de la cobertura de su seguro. Sin embargo, es posible que podamos encontrar un medicamento sustituto a menor costo o llenar un formulario para que el seguro cubra el medicamento que se considera necesario.   Si se requiere una autorizacin previa para que su compaa de seguros cubra su medicamento, por favor permtanos de 1 a 2 das hbiles para completar este proceso.  Los precios de los medicamentos varan con frecuencia dependiendo del lugar de dnde se surte la receta y alguna farmacias pueden ofrecer precios ms baratos.  El sitio web www.goodrx.com tiene cupones para medicamentos de diferentes farmacias. Los precios aqu no tienen en cuenta lo que podra costar con la ayuda del seguro (puede ser ms barato con su seguro), pero el sitio web puede darle el  precio si no utiliz ningn seguro.  - Puede imprimir el cupn correspondiente y llevarlo con su receta a la farmacia.  - Tambin puede pasar por nuestra oficina durante el horario de atencin regular y recoger una tarjeta de cupones de GoodRx.  - Si necesita que su receta se enve electrnicamente a una farmacia diferente, informe a nuestra oficina a travs de MyChart de [place] o por telfono llamando al [phone] y presione la opcin 4.  

## 2022-07-26 NOTE — Progress Notes (Unsigned)
Follow-Up Visit   Subjective  [name] is a 77 y.o. female who presents for the following: Actinic Keratosis (Bil lower legs, pt txted with 5FU/Calcipotriene bid for > 7 days) and check spot (L leg). The patient has spots, moles and lesions to be evaluated, some may be new or changing and the patient has concerns that these could be cancer.  The following portions of the chart were reviewed this encounter and updated as appropriate:   Tobacco  Allergies  Meds  Problems  Med Hx  Surg Hx  Fam Hx     Review of Systems:  No other skin or systemic complaints except as noted in HPI or Assessment and Plan.  Objective  Well appearing patient in no apparent distress; mood and affect are within normal limits.  A focused examination was performed including bil lower legs. Relevant physical exam findings are noted in the Assessment and Plan.  L lat inf knee Hyperkeratotic pap 1.2cm     Right Lower Leg - Anterior Resolving macules lower legs  R lower leg x 1 Stuck on waxy paps with erythema   Assessment & Plan   Actinic Damage - chronic, secondary to cumulative UV radiation exposure/sun exposure over time - diffuse scaly erythematous macules with underlying dyspigmentation - Recommend daily broad spectrum sunscreen SPF 30+ to sun-exposed areas, reapply every 2 hours as needed.  - Recommend staying in the shade or wearing long sleeves, sun glasses (UVA+UVB protection) and wide brim hats (4-inch brim around the entire circumference of the hat). - Call for new or changing lesions.   Seborrheic Keratoses - Stuck-on, waxy, tan-brown papules and/or plaques  - Benign-appearing - Discussed benign etiology and prognosis. - Observe - Call for any changes  Neoplasm of skin L lat inf knee Epidermal / dermal shaving  Lesion diameter (cm):  1.2 Informed consent: discussed and consent obtained   Timeout: patient name, date of birth, surgical site, and procedure verified    Procedure prep:  Patient was prepped and draped in usual sterile fashion Prep type:  Isopropyl alcohol Anesthesia: the lesion was anesthetized in a standard fashion   Anesthetic:  1% lidocaine w/ epinephrine 1-100,000 buffered w/ 8.4% NaHCO3 Instrument used: flexible razor blade   Hemostasis achieved with: pressure, aluminum chloride and electrodesiccation   Outcome: patient tolerated procedure well   Post-procedure details: sterile dressing applied and wound care instructions given   Dressing type: bandage and petrolatum    Destruction of lesion Complexity: extensive   Destruction method: electrodesiccation and curettage   Informed consent: discussed and consent obtained   Timeout:  patient name, date of birth, surgical site, and procedure verified Procedure prep:  Patient was prepped and draped in usual sterile fashion Prep type:  Isopropyl alcohol Anesthesia: the lesion was anesthetized in a standard fashion   Anesthetic:  1% lidocaine w/ epinephrine 1-100,000 buffered w/ 8.4% NaHCO3 Curettage performed in three different directions: Yes   Electrodesiccation performed over the curetted area: Yes   Lesion length (cm):  1.2 Lesion width (cm):  1.2 Margin per side (cm):  0.3 Final wound size (cm):  1.8 Hemostasis achieved with:  pressure, aluminum chloride and electrodesiccation Outcome: patient tolerated procedure well with no complications   Post-procedure details: sterile dressing applied and wound care instructions given   Dressing type: bandage and petrolatum    mupirocin ointment (BACTROBAN) 2 % Apply 1 Application topically daily. Qd to biopsy site on Left leg  Specimen 1 - Surgical pathology Differential Diagnosis: D48.5  R/O SCC Check Margins: yes Hyperkeratotic pap 1.2cm EDC Start Mupirocin oint qd until healed  History of actinic keratoses Right Lower Leg - Anterior Good reaction with 5FU/Calcipotriene treatment and skin much smoother. May consider another  course of treatment if still persistent areas after fully heals.  Inflamed seborrheic keratosis R lower leg x 1 Symptomatic, irritating, patient would like treated. Destruction of lesion - R lower leg x 1 Complexity: simple   Destruction method: cryotherapy   Informed consent: discussed and consent obtained   Timeout:  patient name, date of birth, surgical site, and procedure verified Lesion destroyed using liquid nitrogen: Yes   Region frozen until ice ball extended beyond lesion: Yes   Outcome: patient tolerated procedure well with no complications   Post-procedure details: wound care instructions given    History of Squamous Cell Carcinoma of the Skin - multiple - No evidence of recurrence today - No lymphadenopathy - Recommend regular full body skin exams - Recommend daily broad spectrum sunscreen SPF 30+ to sun-exposed areas, reapply every 2 hours as needed.  - Call if any new or changing lesions are noted between office visits  Return in about 16 weeks (around 11/15/2022) for AK f/u lower legs.  I, Othelia Pulling, RMA, am acting as scribe for Sarina Ser, MD . Documentation: I have reviewed the above documentation for accuracy and completeness, and I agree with the above.  Sarina Ser, MD

## 2022-07-27 ENCOUNTER — Encounter: Payer: Self-pay | Admitting: Dermatology

## 2022-08-01 ENCOUNTER — Telehealth: Payer: Self-pay

## 2022-08-01 NOTE — Telephone Encounter (Signed)
Discussed biopsy results with patient   left lat knee WELL DIFFERENTIATED SQUAMOUS CELL CARCINOMA   Cancer - SCC Already treated Recheck next visit

## 2022-08-03 ENCOUNTER — Other Ambulatory Visit: Payer: Self-pay | Admitting: Cardiovascular Disease

## 2022-08-03 ENCOUNTER — Telehealth: Payer: Self-pay | Admitting: Internal Medicine

## 2022-08-03 MED ORDER — DIPHENOXYLATE-ATROPINE 2.5-0.025 MG PO TABS: 1.0000 | ORAL_TABLET | Freq: Four times a day (QID) | ORAL | 3 refills | Status: DC | PRN

## 2022-08-03 NOTE — Telephone Encounter (Signed)
Please advise Sir, thank you. 

## 2022-08-03 NOTE — Telephone Encounter (Signed)
Patient is calling wishing to have a refill on Lomotil she has changed her pharmacy to [facility] [address] and if she could get a 90 day Rx due she is going on vacation. Please advise

## 2022-08-03 NOTE — Telephone Encounter (Signed)
Patient informed and said thank you. 

## 2022-08-06 ENCOUNTER — Other Ambulatory Visit: Payer: Self-pay

## 2022-08-06 MED ORDER — FLECAINIDE ACETATE 100 MG PO TABS: 100.0000 mg | ORAL_TABLET | Freq: Two times a day (BID) | ORAL | 0 refills | Status: DC

## 2022-08-06 MED ORDER — METOPROLOL TARTRATE 50 MG PO TABS
ORAL_TABLET | ORAL | 0 refills | Status: DC
Start: 2022-08-06 — End: 2022-09-04

## 2022-08-24 ENCOUNTER — Other Ambulatory Visit: Payer: Self-pay | Admitting: Cardiovascular Disease

## 2022-09-04 ENCOUNTER — Telehealth: Payer: Self-pay | Admitting: Cardiovascular Disease

## 2022-09-04 ENCOUNTER — Other Ambulatory Visit: Payer: Self-pay

## 2022-09-04 DIAGNOSIS — I480 Paroxysmal atrial fibrillation: Secondary | ICD-10-CM

## 2022-09-04 MED ORDER — METOPROLOL SUCCINATE ER 25 MG PO TB24: 37.5000 mg | ORAL_TABLET | Freq: Every day | ORAL | 3 refills | Status: DC

## 2022-09-04 MED ORDER — RIVAROXABAN 20 MG PO TABS: ORAL_TABLET | ORAL | 1 refills | Status: DC

## 2022-09-04 NOTE — Telephone Encounter (Signed)
Spoke with patient.  Patient needing Refills on TOPROL XL

## 2022-09-04 NOTE — Telephone Encounter (Signed)
Refills sent

## 2022-09-04 NOTE — Telephone Encounter (Signed)
*  STAT* If patient is at the pharmacy, call can be transferred to refill team.   1. Which medications need to be refilled? (please list name of each medication and dose if known)   metoprolol succinate (TOPROL-XL) 25 MG 24 hr tablet    rivaroxaban (XARELTO) 20 MG TABS tablet    2. Which pharmacy/location (including street and city if local pharmacy) is medication to be sent to? [id number] - [phone]  3. Do they need a 30 day or 90 day supply? 90 day  Pt is out of medications   Pt c/o medication issue:  1. Name of Medication:   metoprolol tartrate (LOPRESSOR) 50 MG tablet    2. How are you currently taking this medication (dosage and times per day)? Take 1 tablet (50 mg) 2 hours prior to your CT.   3. Are you having a reaction (difficulty breathing--STAT)? No  4. What is your medication issue? Pt states that she has been taking mediation for almost a month thinking that it was   metoprolol succinate (TOPROL-XL) 25 MG 24 hr tablet   Pt says that she has been having headaches do to this. She would like a callback regarding this matter

## 2022-09-12 ENCOUNTER — Other Ambulatory Visit: Payer: Self-pay | Admitting: Cardiovascular Disease

## 2022-10-27 ENCOUNTER — Other Ambulatory Visit: Payer: Self-pay | Admitting: Cardiovascular Disease

## 2022-11-21 DIAGNOSIS — H1589 Other disorders of sclera: Secondary | ICD-10-CM | POA: Diagnosis not present

## 2022-11-22 ENCOUNTER — Other Ambulatory Visit: Payer: Self-pay | Admitting: Dermatology

## 2022-11-22 DIAGNOSIS — D492 Neoplasm of unspecified behavior of bone, soft tissue, and skin: Secondary | ICD-10-CM

## 2022-12-05 ENCOUNTER — Ambulatory Visit: Payer: PPO | Admitting: Dermatology

## 2023-01-18 DIAGNOSIS — H11002 Unspecified pterygium of left eye: Secondary | ICD-10-CM | POA: Diagnosis not present

## 2023-02-21 ENCOUNTER — Ambulatory Visit: Payer: PPO | Admitting: Dermatology

## 2023-02-21 ENCOUNTER — Encounter: Payer: Self-pay | Admitting: Dermatology

## 2023-02-21 VITALS — BP 104/62 | HR 55

## 2023-02-21 DIAGNOSIS — L578 Other skin changes due to chronic exposure to nonionizing radiation: Secondary | ICD-10-CM

## 2023-02-21 DIAGNOSIS — D492 Neoplasm of unspecified behavior of bone, soft tissue, and skin: Secondary | ICD-10-CM

## 2023-02-21 DIAGNOSIS — W908XXA Exposure to other nonionizing radiation, initial encounter: Secondary | ICD-10-CM

## 2023-02-21 DIAGNOSIS — C44729 Squamous cell carcinoma of skin of left lower limb, including hip: Secondary | ICD-10-CM

## 2023-02-21 DIAGNOSIS — L814 Other melanin hyperpigmentation: Secondary | ICD-10-CM | POA: Diagnosis not present

## 2023-02-21 DIAGNOSIS — Z85828 Personal history of other malignant neoplasm of skin: Secondary | ICD-10-CM | POA: Diagnosis not present

## 2023-02-21 DIAGNOSIS — Z8589 Personal history of malignant neoplasm of other organs and systems: Secondary | ICD-10-CM

## 2023-02-21 DIAGNOSIS — L821 Other seborrheic keratosis: Secondary | ICD-10-CM

## 2023-02-21 NOTE — Patient Instructions (Addendum)
Wound Care Instructions  Cleanse wound gently with soap and water once a day then pat dry with clean gauze. Apply a thin coat of Petrolatum (petroleum jelly, "Vaseline") over the wound (unless you have an allergy to this). We recommend that you use a new, sterile tube of Vaseline. Do not pick or remove scabs. Do not remove the yellow or white "healing tissue" from the base of the wound.  Cover the wound with fresh, clean, nonstick gauze and secure with paper tape. You may use Band-Aids in place of gauze and tape if the wound is small enough, but would recommend trimming much of the tape off as there is often too much. Sometimes Band-Aids can irritate the skin.  You should call the office for your biopsy report after 1 week if you have not already been contacted.  If you experience any problems, such as abnormal amounts of bleeding, swelling, significant bruising, significant pain, or evidence of infection, please call the office immediately.  FOR ADULT SURGERY PATIENTS: If you need something for pain relief you may take 1 extra strength Tylenol (acetaminophen) AND 2 Ibuprofen (200mg  each) together every 4 hours as needed for pain. (do not take these if you are allergic to them or if you have a reason you should not take them.) Typically, you may only need pain medication for 1 to 3 days.     Recommend daily broad spectrum sunscreen SPF 30+ to sun-exposed areas, reapply every 2 hours as needed. Call for new or changing lesions.  Staying in the shade or wearing long sleeves, sun glasses (UVA+UVB protection) and wide brim hats (4-inch brim around the entire circumference of the hat) are also recommended for sun protection.    Due to recent changes in healthcare laws, you may see results of your pathology and/or laboratory studies on MyChart before the doctors have had a chance to review them. We understand that in some cases there may be results that are confusing or concerning to you. Please  understand that not all results are received at the same time and often the doctors may need to interpret multiple results in order to provide you with the best plan of care or course of treatment. Therefore, we ask that you please give [place] 2 business days to thoroughly review all your results before contacting the office for clarification. Should we see a critical lab result, you will be contacted sooner.   If You Need Anything After Your Visit  If you have any questions or concerns for your doctor, please call our main line at [phone] and press option 4 to reach your doctor's medical assistant. If no one answers, please leave a voicemail as directed and we will return your call as soon as possible. Messages left after 4 pm will be answered the following business day.   You may also send [place] a message via MyChart. We typically respond to MyChart messages within 1-2 business days.  For prescription refills, please ask your pharmacy to contact our office. Our fax number is [phone].  If you have an urgent issue when the clinic is closed that cannot wait until the next business day, you can page your doctor at the number below.    Please note that while we do our best to be available for urgent issues outside of office hours, we are not available 24/7.   If you have an urgent issue and are unable to reach [place], you may choose to seek medical care at your doctor's  office, retail clinic, urgent care center, or emergency room.  If you have a medical emergency, please immediately call 911 or go to the emergency department.  Pager Numbers  - Dr. Gwen Pounds: [phone]  - Dr. Roseanne Reno: [phone]  In the event of inclement weather, please call our main line at [phone] for an update on the status of any delays or closures.  Dermatology Medication Tips: Please keep the boxes that topical medications come in in order to help keep track of the instructions about where and how to use these.  Pharmacies typically print the medication instructions only on the boxes and not directly on the medication tubes.   If your medication is too expensive, please contact our office at [phone] option 4 or send [place] a message through MyChart.   We are unable to tell what your co-pay for medications will be in advance as this is different depending on your insurance coverage. However, we may be able to find a substitute medication at lower cost or fill out paperwork to get insurance to cover a needed medication.   If a prior authorization is required to get your medication covered by your insurance company, please allow [place] 1-2 business days to complete this process.  Drug prices often vary depending on where the prescription is filled and some pharmacies may offer cheaper prices.  The website www.goodrx.com contains coupons for medications through different pharmacies. The prices here do not account for what the cost may be with help from insurance (it may be cheaper with your insurance), but the website can give you the price if you did not use any insurance.  - You can print the associated coupon and take it with your prescription to the pharmacy.  - You may also stop by our office during regular business hours and pick up a GoodRx coupon card.  - If you need your prescription sent electronically to a different pharmacy, notify our office through [facility] or by phone at [phone] option 4.     Si Usted Necesita Algo Despus de Su Visita  Tambin puede enviarnos un mensaje a travs de Clinical cytogeneticist. Por lo general respondemos a los mensajes de MyChart en el transcurso de 1 a 2 das hbiles.  Para renovar recetas, por favor pida a su farmacia que se ponga en contacto con nuestra oficina. Annie Sable de fax es [place] [phone].  Si tiene un asunto urgente cuando la clnica est cerrada y que no puede esperar hasta el siguiente da hbil, puede llamar/localizar a su doctor(a) al nmero  que aparece a continuacin.   Por favor, tenga en cuenta que aunque hacemos todo lo posible para estar disponibles para asuntos urgentes fuera del horario de [place], no estamos disponibles las 24 horas del da, los 7 [address]  [address] de la [place].   Si tiene un problema urgente y no puede comunicarse con nosotros, puede optar por buscar atencin mdica  en el consultorio de su doctor(a), en una clnica privada, en un centro de atencin urgente o en una sala de emergencias.  Si tiene Engineer, drilling, por favor llame inmediatamente al 911 o vaya a la sala de emergencias.  Nmeros de bper  - Dr. Gwen Pounds: [phone]  - Dra. Roseanne Reno: [phone]  En caso de inclemencias del [place], por favor llame a Lacy Duverney principal al [phone] para una actualizacin sobre el [place] de cualquier retraso o cierre.  Consejos para la medicacin en dermatologa: Por favor, guarde las cajas en las que vienen los medicamentos de  uso tpico para ayudarle a seguir las [employer] dnde y cmo usarlos. Las farmacias generalmente imprimen las instrucciones del medicamento slo en las cajas y no directamente en los tubos del [place].   Si su medicamento es muy caro, por favor, pngase en contacto con Rolm Gala llamando al [phone] y presione la opcin 4 o envenos un mensaje a travs de Clinical cytogeneticist.   No podemos decirle cul ser su copago por los medicamentos por adelantado ya que esto es diferente dependiendo de la cobertura de su seguro. Sin embargo, es posible que podamos encontrar un medicamento sustituto a Audiological scientist un formulario para que el seguro cubra el medicamento que se considera necesario.   Si se requiere una autorizacin previa para que su compaa de seguros [place] su medicamento, por favor permtanos de 1 a 2 das hbiles para completar [address]  Los precios de los medicamentos varan con frecuencia dependiendo del Environmental consultant de dnde se surte la receta y alguna  farmacias pueden ofrecer precios ms baratos.  El sitio web www.goodrx.com tiene cupones para medicamentos de Health and safety inspector. Los precios aqu no tienen en cuenta lo que podra costar con la ayuda del seguro (puede ser ms barato con su seguro), pero el sitio web puede darle el precio si no utiliz Tourist information centre manager.  - Puede imprimir el cupn correspondiente y llevarlo con su receta a la farmacia.  - Tambin puede pasar por nuestra oficina durante el horario de atencin regular y Education officer, museum una tarjeta de cupones de GoodRx.  - Si necesita que su receta se enve electrnicamente a una farmacia diferente, informe a nuestra oficina a travs de MyChart de [place] o por telfono llamando al [phone] y presione la opcin 4.

## 2023-02-21 NOTE — Progress Notes (Signed)
Follow-Up Visit   Subjective  [name] is a 77 y.o. female who presents for the following: Spot. Left thigh. Painful. Dur: 1 month. Hx of SCCs The patient has spots, moles and lesions to be evaluated, some may be new or changing and the patient may have concern these could be cancer. Husband is with patient and contributes to history.   The following portions of the chart were reviewed this encounter and updated as appropriate: medications, allergies, medical history  Review of Systems:  No other skin or systemic complaints except as noted in HPI or Assessment and Plan.  Objective  Well appearing patient in no apparent distress; mood and affect are within normal limits. A focused examination was performed of the following areas: Left thigh Relevant physical exam findings are noted in the Assessment and Plan.  left anterior thigh 1.5 cm pink firm tender nodule         Assessment & Plan   Neoplasm of skin left anterior thigh  Epidermal / dermal shaving  Lesion diameter (cm):  1.5 Informed consent: discussed and consent obtained   Timeout: patient name, date of birth, surgical site, and procedure verified   Procedure prep:  Patient was prepped and draped in usual sterile fashion Prep type:  Isopropyl alcohol Anesthesia: the lesion was anesthetized in a standard fashion   Anesthetic:  1% lidocaine w/ epinephrine 1-100,000 buffered w/ 8.4% NaHCO3 Instrument used: flexible razor blade   Hemostasis achieved with: pressure, aluminum chloride and electrodesiccation   Outcome: patient tolerated procedure well   Post-procedure details: sterile dressing applied and wound care instructions given   Dressing type: bandage and petrolatum    Destruction of lesion Complexity: extensive   Destruction method: electrodesiccation and curettage   Informed consent: discussed and consent obtained   Timeout:  patient name, date of birth, surgical site, and procedure  verified Procedure prep:  Patient was prepped and draped in usual sterile fashion Prep type:  Isopropyl alcohol Anesthesia: the lesion was anesthetized in a standard fashion   Anesthetic:  1% lidocaine w/ epinephrine 1-100,000 buffered w/ 8.4% NaHCO3 Curettage performed in three different directions: Yes   Electrodesiccation performed over the curetted area: Yes   Curettage cycles:  3 Lesion length (cm):  1.5 Lesion width (cm):  1.5 Margin per side (cm):  0.2 Final wound size (cm):  1.9 Hemostasis achieved with:  pressure and aluminum chloride Outcome: patient tolerated procedure well with no complications   Post-procedure details: sterile dressing applied and wound care instructions given   Dressing type: bandage and petrolatum    Specimen 1 - Surgical pathology Differential Diagnosis: R/O SCC  Check Margins: No  LENTIGINES Exam: scattered tan macules Due to sun exposure Treatment Plan: Benign-appearing, observe. Recommend daily broad spectrum sunscreen SPF 30+ to sun-exposed areas, reapply every 2 hours as needed.  Call for any changes  ACTINIC DAMAGE - chronic, secondary to cumulative UV radiation exposure/sun exposure over time - diffuse scaly erythematous macules with underlying dyspigmentation - Recommend daily broad spectrum sunscreen SPF 30+ to sun-exposed areas, reapply every 2 hours as needed.  - Recommend staying in the shade or wearing long sleeves, sun glasses (UVA+UVB protection) and wide brim hats (4-inch brim around the entire circumference of the hat). - Call for new or changing lesions.  HISTORY OF SQUAMOUS CELL CARCINOMA OF THE SKIN - No evidence of recurrence today - No lymphadenopathy - Recommend regular full body skin exams - Recommend daily broad spectrum sunscreen SPF 30+ to sun-exposed  areas, reapply every 2 hours as needed.  - Call if any new or changing lesions are noted between office visits  SEBORRHEIC KERATOSIS - Stuck-on, waxy, tan-brown  papules and/or plaques  - Benign-appearing - Discussed benign etiology and prognosis. - Observe - Call for any changes  Return for [place] Follow Up 3-4 months.  I, Lawson Radar, CMA, am acting as scribe for Armida Sans, MD.  Documentation: I have reviewed the above documentation for accuracy and completeness, and I agree with the above.  Armida Sans, MD

## 2023-02-26 ENCOUNTER — Encounter: Payer: Self-pay | Admitting: Dermatology

## 2023-03-05 ENCOUNTER — Telehealth: Payer: Self-pay

## 2023-03-05 NOTE — Telephone Encounter (Signed)
-----   Message from Armida Sans sent at 02/28/2023  5:22 PM EDT ----- Diagnosis Skin , left anterior thigh WELL DIFFERENTIATED SQUAMOUS CELL CARCINOMA WITH SUPERFICIAL INFILTRATION  Cancer = SCC Already treated Recheck next visit

## 2023-03-05 NOTE — Telephone Encounter (Signed)
Discussed pathology results. Patient voiced understanding.

## 2023-03-12 ENCOUNTER — Other Ambulatory Visit: Payer: Self-pay | Admitting: Internal Medicine

## 2023-03-12 NOTE — Telephone Encounter (Signed)
Please advise Sir, thank you. 

## 2023-03-13 ENCOUNTER — Other Ambulatory Visit: Payer: Self-pay

## 2023-03-13 ENCOUNTER — Inpatient Hospital Stay (HOSPITAL_COMMUNITY)
Admission: EM | Admit: 2023-03-13 | Discharge: 2023-03-15 | DRG: 440 | Disposition: A | Discharge status: Home / Self Care | Payer: PPO | Attending: Infectious Diseases | Admitting: Infectious Diseases

## 2023-03-13 ENCOUNTER — Emergency Department (HOSPITAL_COMMUNITY): Payer: PPO

## 2023-03-13 ENCOUNTER — Observation Stay (HOSPITAL_COMMUNITY): Payer: PPO

## 2023-03-13 ENCOUNTER — Encounter (HOSPITAL_COMMUNITY): Payer: Self-pay | Admitting: Emergency Medicine

## 2023-03-13 DIAGNOSIS — K219 Gastro-esophageal reflux disease without esophagitis: Secondary | ICD-10-CM | POA: Diagnosis present

## 2023-03-13 DIAGNOSIS — K582 Mixed irritable bowel syndrome: Secondary | ICD-10-CM | POA: Diagnosis present

## 2023-03-13 DIAGNOSIS — R112 Nausea with vomiting, unspecified: Principal | ICD-10-CM

## 2023-03-13 DIAGNOSIS — K638219 Small intestinal bacterial overgrowth, unspecified: Secondary | ICD-10-CM | POA: Diagnosis present

## 2023-03-13 DIAGNOSIS — Z886 Allergy status to analgesic agent status: Secondary | ICD-10-CM

## 2023-03-13 DIAGNOSIS — K859 Acute pancreatitis without necrosis or infection, unspecified: Secondary | ICD-10-CM | POA: Diagnosis not present

## 2023-03-13 DIAGNOSIS — Z7901 Long term (current) use of anticoagulants: Secondary | ICD-10-CM

## 2023-03-13 DIAGNOSIS — I480 Paroxysmal atrial fibrillation: Secondary | ICD-10-CM | POA: Diagnosis present

## 2023-03-13 DIAGNOSIS — M19042 Primary osteoarthritis, left hand: Secondary | ICD-10-CM | POA: Diagnosis present

## 2023-03-13 DIAGNOSIS — M810 Age-related osteoporosis without current pathological fracture: Secondary | ICD-10-CM | POA: Diagnosis present

## 2023-03-13 DIAGNOSIS — Z90710 Acquired absence of both cervix and uterus: Secondary | ICD-10-CM

## 2023-03-13 DIAGNOSIS — R109 Unspecified abdominal pain: Secondary | ICD-10-CM | POA: Diagnosis not present

## 2023-03-13 DIAGNOSIS — K8689 Other specified diseases of pancreas: Secondary | ICD-10-CM | POA: Diagnosis not present

## 2023-03-13 DIAGNOSIS — K8500 Idiopathic acute pancreatitis without necrosis or infection: Secondary | ICD-10-CM | POA: Diagnosis not present

## 2023-03-13 DIAGNOSIS — F419 Anxiety disorder, unspecified: Secondary | ICD-10-CM | POA: Diagnosis present

## 2023-03-13 DIAGNOSIS — R079 Chest pain, unspecified: Secondary | ICD-10-CM | POA: Diagnosis not present

## 2023-03-13 DIAGNOSIS — Z87891 Personal history of nicotine dependence: Secondary | ICD-10-CM

## 2023-03-13 DIAGNOSIS — R1013 Epigastric pain: Secondary | ICD-10-CM | POA: Diagnosis not present

## 2023-03-13 DIAGNOSIS — I4891 Unspecified atrial fibrillation: Secondary | ICD-10-CM

## 2023-03-13 DIAGNOSIS — Z8249 Family history of ischemic heart disease and other diseases of the circulatory system: Secondary | ICD-10-CM

## 2023-03-13 DIAGNOSIS — K760 Fatty (change of) liver, not elsewhere classified: Secondary | ICD-10-CM | POA: Diagnosis not present

## 2023-03-13 DIAGNOSIS — M818 Other osteoporosis without current pathological fracture: Secondary | ICD-10-CM

## 2023-03-13 DIAGNOSIS — Z85828 Personal history of other malignant neoplasm of skin: Secondary | ICD-10-CM

## 2023-03-13 DIAGNOSIS — Z79899 Other long term (current) drug therapy: Secondary | ICD-10-CM

## 2023-03-13 DIAGNOSIS — G4700 Insomnia, unspecified: Secondary | ICD-10-CM | POA: Diagnosis present

## 2023-03-13 DIAGNOSIS — K8591 Acute pancreatitis with uninfected necrosis, unspecified: Secondary | ICD-10-CM | POA: Diagnosis not present

## 2023-03-13 DIAGNOSIS — R519 Headache, unspecified: Secondary | ICD-10-CM | POA: Diagnosis not present

## 2023-03-13 DIAGNOSIS — R001 Bradycardia, unspecified: Secondary | ICD-10-CM | POA: Diagnosis not present

## 2023-03-13 DIAGNOSIS — Z7983 Long term (current) use of bisphosphonates: Secondary | ICD-10-CM

## 2023-03-13 DIAGNOSIS — Z820 Family history of epilepsy and other diseases of the nervous system: Secondary | ICD-10-CM

## 2023-03-13 DIAGNOSIS — M19041 Primary osteoarthritis, right hand: Secondary | ICD-10-CM | POA: Diagnosis present

## 2023-03-13 DIAGNOSIS — Z833 Family history of diabetes mellitus: Secondary | ICD-10-CM

## 2023-03-13 DIAGNOSIS — Z91040 Latex allergy status: Secondary | ICD-10-CM

## 2023-03-13 DIAGNOSIS — I959 Hypotension, unspecified: Secondary | ICD-10-CM | POA: Diagnosis not present

## 2023-03-13 DIAGNOSIS — Z888 Allergy status to other drugs, medicaments and biological substances status: Secondary | ICD-10-CM

## 2023-03-13 DIAGNOSIS — Z841 Family history of disorders of kidney and ureter: Secondary | ICD-10-CM

## 2023-03-13 DIAGNOSIS — E785 Hyperlipidemia, unspecified: Secondary | ICD-10-CM | POA: Diagnosis present

## 2023-03-13 DIAGNOSIS — Z9049 Acquired absence of other specified parts of digestive tract: Secondary | ICD-10-CM

## 2023-03-13 DIAGNOSIS — M19011 Primary osteoarthritis, right shoulder: Secondary | ICD-10-CM | POA: Diagnosis present

## 2023-03-13 LAB — CBC
HCT: 37.7 % (ref 36.0–46.0)
Hemoglobin: 12.2 g/dL (ref 12.0–15.0)
MCH: 29.2 pg (ref 26.0–34.0)
MCHC: 32.4 g/dL (ref 30.0–36.0)
MCV: 90.2 fL (ref 80.0–100.0)
Platelets: 214 10*3/uL (ref 150–400)
RBC: 4.18 MIL/uL (ref 3.87–5.11)
RDW: 13.4 % (ref 11.5–15.5)
WBC: 9.4 10*3/uL (ref 4.0–10.5)
nRBC: 0 % (ref 0.0–0.2)

## 2023-03-13 LAB — COMPREHENSIVE METABOLIC PANEL
ALT: 15 U/L (ref 0–44)
AST: 19 U/L (ref 15–41)
Albumin: 3.6 g/dL (ref 3.5–5.0)
Alkaline Phosphatase: 67 U/L (ref 38–126)
Anion gap: 7 (ref 5–15)
BUN: 13 mg/dL (ref 8–23)
CO2: 25 mmol/L (ref 22–32)
Calcium: 8.7 mg/dL — ABNORMAL LOW (ref 8.9–10.3)
Chloride: 105 mmol/L (ref 98–111)
Creatinine, Ser: 1 mg/dL (ref 0.44–1.00)
GFR, Estimated: 58 mL/min — ABNORMAL LOW (ref >60)
Glucose, Bld: 101 mg/dL — ABNORMAL HIGH (ref 70–99)
Potassium: 3.8 mmol/L (ref 3.5–5.1)
Sodium: 137 mmol/L (ref 135–145)
Total Bilirubin: 0.5 mg/dL (ref 0.3–1.2)
Total Protein: 6.6 g/dL (ref 6.5–8.1)

## 2023-03-13 LAB — URINALYSIS, ROUTINE W REFLEX MICROSCOPIC
Bilirubin Urine: NEGATIVE
Glucose, UA: NEGATIVE mg/dL
Hgb urine dipstick: NEGATIVE
Ketones, ur: 5 mg/dL — AB
Leukocytes,Ua: NEGATIVE
Nitrite: NEGATIVE
Protein, ur: NEGATIVE mg/dL
Specific Gravity, Urine: 1.019 (ref 1.005–1.030)
pH: 6 (ref 5.0–8.0)

## 2023-03-13 LAB — TROPONIN I (HIGH SENSITIVITY): Troponin I (High Sensitivity): 8 ng/L (ref <18)

## 2023-03-13 LAB — LIPID PANEL
Cholesterol: 132 mg/dL (ref 0–200)
HDL: 58 mg/dL (ref >40)
LDL Cholesterol: 56 mg/dL (ref 0–99)
Total CHOL/HDL Ratio: 2.3 RATIO
Triglycerides: 88 mg/dL (ref <150)
VLDL: 18 mg/dL (ref 0–40)

## 2023-03-13 LAB — LIPASE, BLOOD: Lipase: 2780 U/L — ABNORMAL HIGH (ref 11–51)

## 2023-03-13 MED ORDER — LACTATED RINGERS IV SOLN: INTRAVENOUS | Status: AC

## 2023-03-13 MED ORDER — ROSUVASTATIN CALCIUM 5 MG PO TABS
10.0000 mg | ORAL_TABLET | Freq: Every day | ORAL | Status: DC
  Administered 2023-03-13 – 2023-03-15 (×3): 10 mg via ORAL
  Filled 2023-03-13 (×3): qty 2

## 2023-03-13 MED ORDER — METOPROLOL SUCCINATE ER 25 MG PO TB24
37.5000 mg | ORAL_TABLET | Freq: Every day | ORAL | Status: DC
  Filled 2023-03-13: qty 2

## 2023-03-13 MED ORDER — HYDROMORPHONE HCL 1 MG/ML IJ SOLN
0.5000 mg | Freq: Once | INTRAMUSCULAR | Status: AC
  Administered 2023-03-13: 0.5 mg via INTRAVENOUS
  Filled 2023-03-13: qty 1

## 2023-03-13 MED ORDER — IOHEXOL 350 MG/ML SOLN
75.0000 mL | Freq: Once | INTRAVENOUS | Status: AC | PRN
  Administered 2023-03-13: 75 mL via INTRAVENOUS

## 2023-03-13 MED ORDER — OXYCODONE HCL 5 MG PO TABS
5.0000 mg | ORAL_TABLET | Freq: Four times a day (QID) | ORAL | Status: DC | PRN
  Administered 2023-03-13 – 2023-03-14 (×2): 5 mg via ORAL
  Filled 2023-03-13 (×2): qty 1

## 2023-03-13 MED ORDER — MORPHINE SULFATE (PF) 4 MG/ML IV SOLN
4.0000 mg | Freq: Once | INTRAVENOUS | Status: AC
  Administered 2023-03-13: 4 mg via INTRAVENOUS
  Filled 2023-03-13: qty 1

## 2023-03-13 MED ORDER — FAMOTIDINE 20 MG PO TABS
20.0000 mg | ORAL_TABLET | Freq: Every day | ORAL | Status: DC
  Administered 2023-03-13 – 2023-03-15 (×3): 20 mg via ORAL
  Filled 2023-03-13 (×3): qty 1

## 2023-03-13 MED ORDER — FLECAINIDE ACETATE 50 MG PO TABS
100.0000 mg | ORAL_TABLET | Freq: Two times a day (BID) | ORAL | Status: DC
  Administered 2023-03-13 – 2023-03-15 (×4): 100 mg via ORAL
  Filled 2023-03-13 (×6): qty 2

## 2023-03-13 MED ORDER — RIVAROXABAN 10 MG PO TABS
20.0000 mg | ORAL_TABLET | Freq: Every day | ORAL | Status: DC
  Administered 2023-03-13 – 2023-03-15 (×3): 20 mg via ORAL
  Filled 2023-03-13 (×3): qty 2

## 2023-03-13 MED ORDER — ALPRAZOLAM 0.25 MG PO TABS
0.5000 mg | ORAL_TABLET | Freq: Every evening | ORAL | Status: DC | PRN
  Administered 2023-03-14: 0.5 mg via ORAL
  Filled 2023-03-13: qty 2

## 2023-03-13 MED ORDER — ONDANSETRON HCL 4 MG/2ML IJ SOLN
4.0000 mg | Freq: Once | INTRAMUSCULAR | Status: AC
  Administered 2023-03-13: 4 mg via INTRAVENOUS
  Filled 2023-03-13: qty 2

## 2023-03-13 MED ORDER — ACETAMINOPHEN 500 MG PO TABS
1000.0000 mg | ORAL_TABLET | Freq: Four times a day (QID) | ORAL | Status: AC
  Administered 2023-03-13 – 2023-03-14 (×3): 1000 mg via ORAL
  Filled 2023-03-13 (×5): qty 2

## 2023-03-13 MED ORDER — SODIUM CHLORIDE 0.9 % IV BOLUS
500.0000 mL | Freq: Once | INTRAVENOUS | Status: AC
  Administered 2023-03-13: 500 mL via INTRAVENOUS

## 2023-03-13 NOTE — ED Notes (Signed)
Pt ambulated to bathroom with stand by assist.  

## 2023-03-13 NOTE — ED Notes (Signed)
ED TO INPATIENT HANDOFF REPORT  ED Nurse Name and Phone #: Lenell Antu Name/Age/Gender [name] 77 y.o. female Room/Bed: 016C/016C  Code Status   Code Status: Full Code  Home/SNF/Other Home Patient oriented to: self, place, time, and situation Is this baseline? Yes   Triage Complete: Triage complete  Chief Complaint Acute pancreatitis [K85.90]  Triage Note Patient from home with complaints of upper abdominal pain mostly situated under the left breast area, radiating to the left side and left mid back. Reports feeling nauseated,but nothing coming up. Reports incontinence with bowel movements yesterday and loose stools.    Allergies Allergies  Allergen Reactions   Latex    Adhesive [Tape] Rash   Aspirin Other (See Comments)    Aggravates Acid Reflux   Nicotinamide [Niacinamide] Rash   Prednisone Palpitations    Made pts heart race    Level of Care/Admitting Diagnosis ED Disposition     ED Disposition  Admit   Condition  --   Comment  Hospital Area: [facility] [[id number]]  Level of Care: Med-Surg [16]  May place patient in observation at [facility] or [facility] if equivalent level of care is available:: No  Covid Evaluation: Asymptomatic - no recent exposure (last 10 days) testing not required  Diagnosis: Acute pancreatitis [577.0.ICD-9-CM]  Admitting Physician: HATCHER, JEFFREY C [[id number]]  Attending Physician: HATCHER, JEFFREY C [[id number]]          B Medical/Surgery History Past Medical History:  Diagnosis Date   Actinic keratosis 07/13/2020   R lat thigh - bx proven    Arthritis    "fingers; right shoulder"   Atypical mole 12/10/2012   left sacral (severe)   Blood transfusion without reported diagnosis    Broken ribs    C. difficile diarrhea    Cataract    Clostridium difficile colitis 04/01/2016   Collagenous colitis    Coronary Calcium    a. 11/2014 Cardiac CT: Ca2+ score = 50 - all in prox LAD. 69th %'ile for age/sex; b. 05/2015  Ex MV: EF 73%. No infarct/ischemia-->Low risk.   Diverticulosis    Esophageal candidiasis (HCC)    GERD (gastroesophageal reflux disease)    Heart murmur    Hiatal hernia 2004   Hyperlipidemia    IBS (irritable bowel syndrome)    Internal hemorrhoids 2010   Colon-Dr. Mechele Collin    Kidney stones    Midsternal chest pain    a. 01/2011 Ex Mv: EF 68%, No ischemia.   Migraine 01/16/2012   "none now for several years"   Mild Mitral regurgitation    a. 12/2011 Echo: EF 55-60%, no rwma, No evidence of MVP, Mild MR   Osteoporosis    PAF (paroxysmal atrial fibrillation) (HCC)    a. diagnosed 12/2011-->s/p DCCV-->flecainide/Xarelto (CHA2DS2VASc = 2).   Pancreas divisum of native pancreas    probable by CT   Pterygium eye, bilateral    Schatzki's ring 2010    EGD- Dr. Shelton Silvas intestinal bacterial overgrowth 06/25/2018   Squamous cell carcinoma of skin 03/31/2014   left mid pretibial, right mid pretibial sup, right mid pretibial inf   Squamous cell carcinoma of skin 05/19/2014   right ant pretibial inf   Squamous cell carcinoma of skin 09/05/2015   left dorsum wrist   Squamous cell carcinoma of skin 11/10/2015   left ant distal thigh   Squamous cell carcinoma of skin 11/26/2017   right prox calf   Squamous cell carcinoma of  skin 07/09/2018   left prox lat pretibial   Squamous cell carcinoma of skin 12/02/2018   left pretibial   Squamous cell carcinoma of skin 05/25/2020   R lower leg anterior    Squamous cell carcinoma of skin 07/13/2020   R ant thigh    Squamous cell carcinoma of skin 11/28/2020   Left distal lat pretibial near ankle - EDC   Squamous cell carcinoma of skin 02/22/2021   L med knee - ED&C   Squamous cell carcinoma of skin 02/22/2021   L mid to distal pretibial - ED&C   Squamous cell carcinoma of skin 07/11/2021   Right medial pretibial - EDC   Squamous cell carcinoma of skin 11/15/2021   Right medial thigh - EDC   Squamous cell carcinoma of skin 11/23/2021    right medial ankle, EDC   Squamous cell carcinoma of skin 02/15/2022   R ant ankle - ED&C   Squamous cell carcinoma of skin 02/15/2022   right medial lower leg above ankle inferior - scheduled for ED&C   Squamous cell carcinoma of skin 02/15/2022   right medial lower leg above ankle superior - scheduled for ED&C   Squamous cell carcinoma of skin 07/26/2022   left lateral knee, EDC   Squamous cell carcinoma of skin 02/21/2023   Left anterior thigh. WD SCC with superficial infiltration. EDC.   Past Surgical History:  Procedure Laterality Date   APPENDECTOMY  1963   BREAST BIOPSY     BREAST BIOPSY Right 09/2020   pt had a bx done in [place] heart clip pt stated neg bx   BREAST CYST ASPIRATION Bilateral    "several; both sides"   CARDIOVERSION  01/16/2012   Procedure: CARDIOVERSION;  Surgeon: Marinus Maw, MD;  Location: [facility] OR;  Service: Cardiovascular;  Laterality: N/A;   COLONOSCOPY  Multiple   CYSTOSCOPY W/ STONE MANIPULATION  1980's   EYE SURGERY     x4   LEG SURGERY     skin cancer removal   OVARIAN CYST REMOVAL  1990's   SHOULDER ARTHROSCOPY W/ ROTATOR CUFF REPAIR Right ~ 2005   SQUAMOUS CELL CARCINOMA EXCISION     "10-12 removed; all over my body - nose, predominately legs"   TONSILLECTOMY AND ADENOIDECTOMY  1975   UPPER GASTROINTESTINAL ENDOSCOPY  Multiple   w/dilation, hiatal hernia   VAGINAL HYSTERECTOMY  1972     A IV Location/Drains/Wounds Patient Lines/Drains/Airways Status     Active Line/Drains/Airways     Name Placement date Placement time Site Days   Peripheral IV 03/13/23 20 G Right Antecubital 03/13/23  0618  Antecubital  less than 1            Intake/Output Last 24 hours No intake or output data in the 24 hours ending 03/13/23 1042  Labs/Imaging Results for orders placed or performed during the hospital encounter of 03/13/23 (from the past 48 hour(s))  Lipase, blood     Status: Abnormal   Collection Time: 03/13/23  4:00 AM  Result Value Ref  Range   Lipase 2,780 (H) 11 - 51 U/L    Comment: RESULTS CONFIRMED BY MANUAL DILUTION Performed at [facility], [address], [place], [place] [address]   Comprehensive metabolic panel     Status: Abnormal   Collection Time: 03/13/23  4:00 AM  Result Value Ref Range   Sodium 137 135 - 145 mmol/L   Potassium 3.8 3.5 - 5.1 mmol/L   Chloride 105 98 - 111  mmol/L   CO2 25 22 - 32 mmol/L   Glucose, Bld 101 (H) 70 - 99 mg/dL    Comment: Glucose reference range applies only to samples taken after fasting for at least 8 hours.   BUN 13 8 - 23 mg/dL   Creatinine, Ser 1.91 0.44 - 1.00 mg/dL   Calcium 8.7 (L) 8.9 - 10.3 mg/dL   Total Protein 6.6 6.5 - 8.1 g/dL   Albumin 3.6 3.5 - 5.0 g/dL   AST 19 15 - 41 U/L   ALT 15 0 - 44 U/L   Alkaline Phosphatase 67 38 - 126 U/L   Total Bilirubin 0.5 0.3 - 1.2 mg/dL   GFR, Estimated 58 (L) >60 mL/min    Comment: (NOTE) Calculated using the CKD-EPI Creatinine Equation (2021)    Anion gap 7 5 - 15    Comment: Performed at [facility], [address], [place], [place] [address]  CBC     Status: None   Collection Time: 03/13/23  4:00 AM  Result Value Ref Range   WBC 9.4 4.0 - 10.5 K/uL   RBC 4.18 3.87 - 5.11 MIL/uL   Hemoglobin 12.2 12.0 - 15.0 g/dL   HCT 56.2 13.0 - 86.5 %   MCV 90.2 80.0 - 100.0 fL   MCH 29.2 26.0 - 34.0 pg   MCHC 32.4 30.0 - 36.0 g/dL   RDW 78.4 69.6 - 29.5 %   Platelets 214 150 - 400 K/uL   nRBC 0.0 0.0 - 0.2 %    Comment: Performed at [facility], [address], [place], [place] [address]  Urinalysis, Routine w reflex microscopic -Urine, Clean Catch     Status: Abnormal   Collection Time: 03/13/23  8:09 AM  Result Value Ref Range   Color, Urine STRAW (A) YELLOW   APPearance CLEAR CLEAR   Specific Gravity, Urine 1.019 1.005 - 1.030   pH 6.0 5.0 - 8.0   Glucose, UA NEGATIVE NEGATIVE mg/dL   Hgb urine dipstick NEGATIVE NEGATIVE   Bilirubin Urine NEGATIVE NEGATIVE   Ketones, ur 5 (A) NEGATIVE mg/dL    Protein, ur NEGATIVE NEGATIVE mg/dL   Nitrite NEGATIVE NEGATIVE   Leukocytes,Ua NEGATIVE NEGATIVE    Comment: Microscopic not done on urines with negative protein, blood, leukocytes, nitrite, or glucose < 500 mg/dL. Performed at [facility], [address] [place], [place] [address] CORRECTED ON 08/21 AT 0272: PREVIOUSLY REPORTED AS NEGATIVE    CT ABDOMEN PELVIS W CONTRAST  Result Date: 03/13/2023 CLINICAL DATA:  77 year old female status post fall 1 week ago striking head. On Xarelto. Abdomen and flank pain. EXAM: CT ABDOMEN AND PELVIS WITH CONTRAST TECHNIQUE: Multidetector CT imaging of the abdomen and pelvis was performed using the standard protocol following bolus administration of intravenous contrast. RADIATION DOSE REDUCTION: This exam was performed according to the departmental dose-optimization program which includes automated exposure control, adjustment of the mA and/or kV according to patient size and/or use of iterative reconstruction technique. CONTRAST:  75mL OMNIPAQUE IOHEXOL 350 MG/ML SOLN COMPARISON:  CT Abdomen 03/01/2016. FINDINGS: Lower chest: Negative; minor dependent atelectasis. Hepatobiliary: Negative liver and gallbladder. Pancreas: Pancreas divisum suspected on the 2017 CT, and there has been progressive dilatation of the main pancreatic duct since that time (series 3, image 25 in the body) and there is an enlarged and heterogeneous appearance of the pancreatic head and uncinate now. Lobulated nearly 2 cm area of hypodensity in the uncinate is new since 2017 (coronal  image 101). And both the head and the uncinate appear indistinct. No convincing pancreatic body or tail atrophy. No peripancreatic fluid collection. Some secondary inflammation of the duodenum C-loop is suspected. Spleen: Negative. Adrenals/Urinary Tract: Normal adrenal glands. Symmetric kidneys, renal enhancement, and contrast excretion. Decompressed ureters. Small chronic and benign appearing renal cysts  (no follow-up imaging recommended). Unremarkable bladder. Incidental pelvic phleboliths. Stomach/Bowel: Mild diverticulosis of the large bowel in the sigmoid colon. Mild redundancy and retained stool otherwise. Diminutive or absent appendix. No large bowel inflammation. Negative terminal ileum and no dilated small bowel. Decompressed stomach and duodenum. Some secondary inflammation of the duodenum, see pancreas above. No free air or free fluid. Vascular/Lymphatic: Aortoiliac calcified atherosclerosis. Normal caliber abdominal aorta. Major arterial structures remain patent. Portal venous system is patent. No lymphadenopathy identified. Reproductive: Surgically absent uterus. Diminutive or absent ovaries Other: No pelvis free fluid. Musculoskeletal: No acute osseous abnormality identified. IMPRESSION: 1. Abnormal Pancreas. Pancreas divisum previously suspected on a 2017 CT. Progressive enlargement and heterogeneity of the pancreatic head and uncinate since that time, and progressive enlargement of the main pancreatic duct. Evidence of secondarily inflamed duodenum C-loop. This constellation may reflect Acute On Chronic/recurrent Pancreatitis. However, a cystic mass of the uncinate is difficult to exclude. If there are clinical signs/symptoms of acute pancreatitis then recommend appropriate treatment and a follow-up Abdomen MRI (pancreas mass protocol without and with contrast) once the patient has recovered. 2. No other acute process or recent traumatic injury identified in the abdomen or pelvis. Aortic Atherosclerosis (ICD10-I70.0). Electronically Signed   By: Odessa Fleming M.D.   On: 03/13/2023 08:02   CT Head Wo Contrast  Result Date: 03/13/2023 CLINICAL DATA:  77 year old female status post fall 1 week ago striking head. On Xarelto. Persistent headache. EXAM: CT HEAD WITHOUT CONTRAST TECHNIQUE: Contiguous axial images were obtained from the base of the skull through the vertex without intravenous contrast.  RADIATION DOSE REDUCTION: This exam was performed according to the departmental dose-optimization program which includes automated exposure control, adjustment of the mA and/or kV according to patient size and/or use of iterative reconstruction technique. COMPARISON:  Brain MRI 06/08/2008. FINDINGS: Brain: Cerebral volume within normal limits for age, mildly decreased since the 2009 MRI. No midline shift, ventriculomegaly, mass effect, evidence of mass lesion, intracranial hemorrhage or evidence of cortically based acute infarction. Gray-white differentiation appears symmetric and normal for age. Vascular: No suspicious intracranial vascular hyperdensity. Mild Calcified atherosclerosis at the skull base. Skull: No fracture identified. Sinuses/Orbits: Visualized paranasal sinuses and mastoids are clear. Other: No orbit or scalp soft tissue injury identified. IMPRESSION: No recent traumatic injury identified. Normal for age non contrast CT appearance of the brain. Electronically Signed   By: Odessa Fleming M.D.   On: 03/13/2023 07:51   DG Chest Port 1 View  Result Date: 03/13/2023 CLINICAL DATA:  77 year old female with chest and abdominal pain. EXAM: PORTABLE CHEST 1 VIEW COMPARISON:  Cardiac CT 02/19/2022. Chest radiographs 12/11/2015 and earlier. FINDINGS: Portable AP semi upright view at 0629 hours. Lung volumes are chronically at the upper limits of normal. Normal cardiac size and mediastinal contours. Visualized tracheal air column is within normal limits. Allowing for portable technique the lungs are clear. No acute osseous abnormality identified. No pneumoperitoneum evident below the diaphragm. IMPRESSION: Negative portable chest. Electronically Signed   By: Odessa Fleming M.D.   On: 03/13/2023 06:49    Pending Labs Unresulted Labs (From admission, onward)     Start     Ordered   03/14/23  0500  Basic metabolic panel  Tomorrow morning,   R        03/13/23 1032   03/14/23 0500  CBC  Tomorrow morning,   R         03/13/23 1032   03/13/23 1032  Comprehensive metabolic panel  Once,   R        03/13/23 1032   03/13/23 0942  Lipid panel  Once,   URGENT        03/13/23 0941            Vitals/Pain Today's Vitals   03/13/23 0641 03/13/23 1000 03/13/23 1032 03/13/23 1041  BP: (!) 145/69  (!) 106/55   Pulse: (!) 52  (!) 51   Resp: (!) 22  16   Temp: 98.2 F (36.8 C)  97.6 F (36.4 C)   TempSrc: Oral  Oral   SpO2: 100%  100%   Weight:      Height:      PainSc:  5   3     Isolation Precautions No active isolations  Medications Medications  lactated ringers infusion (has no administration in time range)  oxyCODONE (Oxy IR/ROXICODONE) immediate release tablet 5 mg (has no administration in time range)  acetaminophen (TYLENOL) tablet 1,000 mg (has no administration in time range)  morphine (PF) 4 MG/ML injection 4 mg (4 mg Intravenous Given 03/13/23 0636)  ondansetron (ZOFRAN) injection 4 mg (4 mg Intravenous Given 03/13/23 0636)  sodium chloride 0.9 % bolus 500 mL (500 mLs Intravenous New Bag/Given 03/13/23 0630)  HYDROmorphone (DILAUDID) injection 0.5 mg (0.5 mg Intravenous Given 03/13/23 1002)  iohexol (OMNIPAQUE) 350 MG/ML injection 75 mL (75 mLs Intravenous Contrast Given 03/13/23 0744)    Mobility walks     Focused Assessments     R Recommendations: See Admitting Provider Note  Report given to:   Additional Notes:

## 2023-03-13 NOTE — ED Provider Notes (Signed)
[place] EMERGENCY DEPARTMENT AT [facility] Provider Note   CSN: [id number] Arrival date & time: 03/13/23  5784     History  Chief Complaint  Patient presents with   Abdominal Pain   Flank Pain    [name] is a 77 y.o. female.  The history is provided by the patient, the spouse and medical records.  Abdominal Pain Flank Pain Associated symptoms include abdominal pain.  [name] is a 77 y.o. female who presents to the Emergency Department complaining of abdominal pain.  She presents to the emergency department accompanied by her husband for evaluation of severe epigastric abdominal pain.  She initially had pain on Monday but it was mild.  She thought it was gas.  On Tuesday the pain significantly worsened and radiated to her left flank and back.  She also has pain that radiates up to her neck posteriorly.  She has associated nausea.  Pain has worsened to the point that she cannot get comfortable and she presents for evaluation.  No fevers, chest pain, difficulty breathing.  She did have a fall about 1 week ago when she was dancing and fell backwards and struck her head and her back.  She has experienced some back pain since that fall.  She has a history of atrial fibrillation and takes Xarelto.      Home Medications Prior to Admission medications   Medication Sig Start Date End Date Taking? Authorizing Provider  ALPRAZolam Prudy Feeler) 0.5 MG tablet Take 1 tablet by mouth at bedtime as needed. 01/09/21   [provider]  Coenzyme Q10 (COQ10 PO) Take 1 tablet by mouth daily.    [provider]  diphenoxylate-atropine (LOMOTIL) 2.5-0.025 MG tablet TAKE ONE TABLET BY MOUTH FOUR TIMES DAILY AS NEEDED FOR DIARRHEA OR LOOSE STOOLS. 03/12/23   Iva Boop, MD  doxycycline (VIBRA-TABS) 100 MG tablet Take 1 tablet (100 mg total) by mouth 2 (two) times daily. Take one tab po BID x 10 days with food. 11/22/22   Deirdre Evener, MD  escitalopram (LEXAPRO) 10  MG tablet Take 1 tablet (10 mg total) by mouth daily. 05/16/18   Carlean Jews, NP  EYSUVIS 0.25 % SUSP Apply 1 drop to eye 3 (three) times daily. 03/13/21   [provider]  famciclovir (FAMVIR) 500 MG tablet Take 1 tablet (500 mg total) by mouth 2 (two) times daily. Patient not taking: Reported on 11/15/2021 03/12/19   Carlean Jews, NP  famotidine (PEPCID) 20 MG tablet Take 20 mg by mouth daily.    [provider]  flecainide (TAMBOCOR) 100 MG tablet TAKE 1 TABLET BY MOUTH TWICE A DAY 09/12/22   Antonieta Iba, MD  fluorouracil (EFUDEX) 5 % cream Apply to the left lower leg BID x 7 days. 11/15/21   Deirdre Evener, MD  FML LIQUIFILM 0.1 % ophthalmic suspension Place 1 drop into both eyes 4 (four) times daily. 05/17/21   [provider]  ibandronate (BONIVA) 150 MG tablet Take 1 tablet (150 mg total) by mouth every 30 (thirty) days. 09/24/19   Carlean Jews, NP  metoprolol succinate (TOPROL-XL) 25 MG 24 hr tablet Take 1.5 tablets (37.5 mg total) by mouth daily. 09/04/22   Antonieta Iba, MD  nitroGLYCERIN (NITROSTAT) 0.4 MG SL tablet Place 1 tablet (0.4 mg total) under the tongue every 5 (five) minutes as needed for chest pain. 02/12/22   Antonieta Iba, MD  rivaroxaban (XARELTO) 20 MG TABS  tablet TAKE 1 TABLET BY MOUTH ONCE A DAY WITH SUPPER 09/04/22   Antonieta Iba, MD  rosuvastatin (CRESTOR) 10 MG tablet Take 1 tablet (10 mg total) by mouth daily. 04/02/22   Antonieta Iba, MD  rosuvastatin (CRESTOR) 5 MG tablet TAKE 1 TABLET BY MOUTH EVERY DAY 08/06/22   Antonieta Iba, MD      Allergies    Latex, Adhesive [tape], Aspirin, Nicotinamide [niacinamide], and Prednisone    Review of Systems   Review of Systems  Gastrointestinal:  Positive for abdominal pain.  Genitourinary:  Positive for flank pain.  All other systems reviewed and are negative.   Physical Exam Updated Vital Signs BP (!) 145/69 (BP Location: Left Arm)   Pulse (!) 52   Temp  98.2 F (36.8 C) (Oral)   Resp (!) 22   Ht 5\' 7"  (1.702 m)   Wt 72 kg   SpO2 100%   BMI 24.86 kg/m  Physical Exam Vitals and nursing note reviewed.  Constitutional:      Appearance: She is well-developed.  HENT:     Head: Normocephalic and atraumatic.  Cardiovascular:     Rate and Rhythm: Normal rate and regular rhythm.     Heart sounds: No murmur heard. Pulmonary:     Effort: Pulmonary effort is normal. No respiratory distress.     Breath sounds: Normal breath sounds.  Abdominal:     Palpations: Abdomen is soft.     Tenderness: There is no guarding or rebound.     Comments: Moderate upper abdominal tenderness  Musculoskeletal:        General: No swelling or tenderness.  Skin:    General: Skin is warm and dry.  Neurological:     Mental Status: She is alert and oriented to person, place, and time.  Psychiatric:        Behavior: Behavior normal.     ED Results / Procedures / Treatments   Labs (all labs ordered are listed, but only abnormal results are displayed) Labs Reviewed  LIPASE, BLOOD - Abnormal; Notable for the following components:      Result Value   Lipase 2,780 (*)    All other components within normal limits  COMPREHENSIVE METABOLIC PANEL - Abnormal; Notable for the following components:   Glucose, Bld 101 (*)    Calcium 8.7 (*)    GFR, Estimated 58 (*)    All other components within normal limits  CBC  URINALYSIS, ROUTINE W REFLEX MICROSCOPIC    EKG EKG Interpretation Date/Time:  Wednesday March 13 2023 03:40:02 EDT Ventricular Rate:  57 PR Interval:  162 QRS Duration:  98 QT Interval:  488 QTC Calculation: 474 R Axis:   -29  Text Interpretation: Sinus bradycardia Cannot rule out Anterior infarct , age undetermined Abnormal ECG Confirmed by Tilden Fossa [phone]) on 03/13/2023 6:28:09 AM  Radiology CT Head Wo Contrast  Result Date: 03/13/2023 CLINICAL DATA:  77 year old female status post fall 1 week ago striking head. On Xarelto.  Persistent headache. EXAM: CT HEAD WITHOUT CONTRAST TECHNIQUE: Contiguous axial images were obtained from the base of the skull through the vertex without intravenous contrast. RADIATION DOSE REDUCTION: This exam was performed according to the departmental dose-optimization program which includes automated exposure control, adjustment of the mA and/or kV according to patient size and/or use of iterative reconstruction technique. COMPARISON:  Brain MRI 06/08/2008. FINDINGS: Brain: Cerebral volume within normal limits for age, mildly decreased since the 2009 MRI. No midline shift, ventriculomegaly, mass effect,  evidence of mass lesion, intracranial hemorrhage or evidence of cortically based acute infarction. Gray-white differentiation appears symmetric and normal for age. Vascular: No suspicious intracranial vascular hyperdensity. Mild Calcified atherosclerosis at the skull base. Skull: No fracture identified. Sinuses/Orbits: Visualized paranasal sinuses and mastoids are clear. Other: No orbit or scalp soft tissue injury identified. IMPRESSION: No recent traumatic injury identified. Normal for age non contrast CT appearance of the brain. Electronically Signed   By: Odessa Fleming M.D.   On: 03/13/2023 07:51   DG Chest Port 1 View  Result Date: 03/13/2023 CLINICAL DATA:  77 year old female with chest and abdominal pain. EXAM: PORTABLE CHEST 1 VIEW COMPARISON:  Cardiac CT 02/19/2022. Chest radiographs 12/11/2015 and earlier. FINDINGS: Portable AP semi upright view at 0629 hours. Lung volumes are chronically at the upper limits of normal. Normal cardiac size and mediastinal contours. Visualized tracheal air column is within normal limits. Allowing for portable technique the lungs are clear. No acute osseous abnormality identified. No pneumoperitoneum evident below the diaphragm. IMPRESSION: Negative portable chest. Electronically Signed   By: Odessa Fleming M.D.   On: 03/13/2023 06:49    Procedures Procedures    Medications  Ordered in ED Medications  HYDROmorphone (DILAUDID) injection 0.5 mg (has no administration in time range)  morphine (PF) 4 MG/ML injection 4 mg (4 mg Intravenous Given 03/13/23 0636)  ondansetron (ZOFRAN) injection 4 mg (4 mg Intravenous Given 03/13/23 0636)  sodium chloride 0.9 % bolus 500 mL (500 mLs Intravenous New Bag/Given 03/13/23 0630)  iohexol (OMNIPAQUE) 350 MG/ML injection 75 mL (75 mLs Intravenous Contrast Given 03/13/23 0744)    ED Course/ Medical Decision Making/ A&P                                 Medical Decision Making Amount and/or Complexity of Data Reviewed Labs: ordered. Radiology: ordered.  Risk Prescription drug management.   Patient with history of atrial fibrillation on Xarelto here for evaluation of abdominal pain that started Monday night.  She does have significant tenderness on examination.  Lipase is very elevated concerning for acute pancreatitis.  Of note she did hit her head about 1 week ago as well as her back in a fall.  Given this recent head injury and her anticoagulation with patient's complaint of headaches will obtain CT head to rule out subacute injury.  Plan to obtain CT abdomen pelvis to further evaluate for acute pancreatitis.  She was treated with IV fluids, pain medications.  Patient care transferred pending additional imaging.        Final Clinical Impression(s) / ED Diagnoses Final diagnoses:  None    Rx / DC Orders ED Discharge Orders     None         Tilden Fossa, MD 03/13/23 [phone]

## 2023-03-13 NOTE — H&P (Signed)
Date: 03/13/2023               Patient Name:  [name] MRN: [id number]  DOB: [birth year] Age / Sex: 77 y.o., female   PCP: Patrice Paradise, MD         Medical Service: Internal Medicine Teaching Service         Attending Physician: Dr. Ginnie Smart, MD      First Contact: Larrie Kass, [place] [phone]    Second Contact: Dr. Olegario Messier, MD Pager [phone]         After Hours (After 5p/  First Contact Pager: [phone]  weekends / holidays): Second Contact Pager: [phone]   SUBJECTIVE   Chief Complaint: abdominal pain  History of Present Illness:  [name] is a 78 year old with PMH of microscopic colitis, paroxysmal atrial fibrillation, history of SCC of skin who presents with abdominal pain.  She states that she started having diarrhea on Saturday, which she has on occasion due to colitis. She took a dose of lometil with some improvement. On Monday, the diarrhea worsened and she began having abdominal pain. Pain was a stabbing pain that started in epigastric region and radiated to left lower quadrant. She has not had nausea or vomiting. She decided to come in this morning as pain wasn't improving.  ED Course: HR in 50s  Given dilaudid and 500cc NS Lipase 2,780 CT ABD/Pelvis showed cystic mass of uncinate and enlargement of main pancreatic duct with inflammation of duodenum and pancreatic head  Meds:  Pepcid 20 mg Xarelto 20 mg Metoprolol succinate 25 mg  Crestor 10 mg Flecainide 100 mg BID Vitamin D3 Xanax 0.5 mg qd  Past Medical History  Past Surgical History:  Procedure Laterality Date   APPENDECTOMY  1963   BREAST BIOPSY     BREAST BIOPSY Right 09/2020   pt had a bx done in [place] heart clip pt stated neg bx   BREAST CYST ASPIRATION Bilateral    "several; both sides"   CARDIOVERSION  01/16/2012   Procedure: CARDIOVERSION;  Surgeon: Marinus Maw, MD;  Location: [facility] OR;  Service: Cardiovascular;  Laterality: N/A;   COLONOSCOPY  Multiple   CYSTOSCOPY W/  STONE MANIPULATION  1980's   EYE SURGERY     x4   LEG SURGERY     skin cancer removal   OVARIAN CYST REMOVAL  1990's   SHOULDER ARTHROSCOPY W/ ROTATOR CUFF REPAIR Right ~ 2005   SQUAMOUS CELL CARCINOMA EXCISION     "10-12 removed; all over my body - nose, predominately legs"   TONSILLECTOMY AND ADENOIDECTOMY  1975   UPPER GASTROINTESTINAL ENDOSCOPY  Multiple   w/dilation, hiatal hernia   VAGINAL HYSTERECTOMY  1972    Social:  Lives With: husband in [place] Occupation: retired, goes to [place] for winter Support: daughter also lives in [place] Level of Function: iADLs [facility] Substances: drinks 4-5 drinks weekly when they go out to eat, denies tobacco use, no ellicit drug use  Family History: sister kidney disease  Allergies: Allergies as of 03/13/2023 - Review Complete 03/13/2023  Allergen Reaction Noted   Latex  03/06/2012   Adhesive [tape] Rash 03/06/2012   Aspirin Other (See Comments)    Nicotinamide [niacinamide] Rash 11/15/2021   Prednisone Palpitations 06/08/2014    Review of Systems: A complete ROS was negative except as per HPI.   OBJECTIVE:   Physical Exam: Blood pressure (!) 145/69, pulse (!) 52, temperature 98.2 F (36.8 C), temperature source  Oral, resp. rate (!) 22, height 5\' 7"  (1.702 m), weight 72 kg, SpO2 100%.  Constitutional: well-appearing, in no acute distress Cardiovascular: regular rate and rhythm, no m/r/g Pulmonary/Chest: normal work of breathing on room air, lungs clear to auscultation bilaterally Abdominal: soft, tenderness present to epigastric region, no rebound or guarding present, murphy's sign negative MSK: normal bulk and tone Skin: multiple darkly pigmented region on legs and torso, recent biopsy healing well on left upper thigh  Labs: CBC    Component Value Date/Time   WBC 9.4 03/13/2023 0400   RBC 4.18 03/13/2023 0400   HGB 12.2 03/13/2023 0400   HGB 12.5 03/31/2021 1015   HCT 37.7 03/13/2023 0400   HCT 38.8  03/31/2021 1015   PLT 214 03/13/2023 0400   PLT 219 03/31/2021 1015   MCV 90.2 03/13/2023 0400   MCV 91 03/31/2021 1015   MCH 29.2 03/13/2023 0400   MCHC 32.4 03/13/2023 0400   RDW 13.4 03/13/2023 0400   RDW 13.2 03/31/2021 1015   LYMPHSABS 2.3 07/08/2019 1448   MONOABS 0.7 07/08/2019 1448   EOSABS 0.1 07/08/2019 1448   BASOSABS 0.1 07/08/2019 1448     CMP     Component Value Date/Time   NA 137 03/13/2023 0400   NA 140 03/31/2021 1015   K 3.8 03/13/2023 0400   CL 105 03/13/2023 0400   CO2 25 03/13/2023 0400   GLUCOSE 101 (H) 03/13/2023 0400   BUN 13 03/13/2023 0400   BUN 22 03/31/2021 1015   CREATININE 1.00 03/13/2023 0400   CALCIUM 8.7 (L) 03/13/2023 0400   PROT 6.6 03/13/2023 0400   PROT 6.1 12/22/2014 1008   ALBUMIN 3.6 03/13/2023 0400   ALBUMIN 4.1 12/22/2014 1008   AST 19 03/13/2023 0400   ALT 15 03/13/2023 0400   ALKPHOS 67 03/13/2023 0400   BILITOT 0.5 03/13/2023 0400   BILITOT 0.5 12/22/2014 1008   GFRNONAA 58 (L) 03/13/2023 0400   GFRAA >60 05/19/2018 0936    Imaging: CT Abd/ Pelvis IMPRESSION: 1. Abnormal Pancreas. Pancreas divisum previously suspected on a 2017 CT. Progressive enlargement and heterogeneity of the pancreatic head and uncinate since that time, and progressive enlargement of the main pancreatic duct. Evidence of secondarily inflamed duodenum C-loop. This constellation may reflect Acute On Chronic/recurrent Pancreatitis. However, a cystic mass of the uncinate is difficult to exclude. If there are clinical signs/symptoms of acute pancreatitis then recommend appropriate treatment and a follow-up Abdomen MRI (pancreas mass protocol without and with contrast) once the patient has recovered.   2. No other acute process or recent traumatic injury identified in the abdomen or pelvis. Aortic Atherosclerosis (ICD10-I70.0).  EKG: sinus bradycardia, LAD, no ST depressions or elevations, QTC 474  ASSESSMENT & PLAN:   Assessment & Plan by  Problem: Active Problems:   Abdominal pain   Nausea and vomiting   [name] is a 77 y.o. person living with a history of microscopic colitis, paroxysmal atrial fibrillation, history of SCC of skin who presented with abdominal pain and admitted for abdominal pain on hospital day 0.   Abdominal pain Possible cystic mass of pancreas She presents with 3 days of worsening abdominal pain. Pain is a stabbing pain located in left upper quadrant and radiates to left lower quadrant. She has not felt like eating much for the last few days due to how severe pain is. Denies nausea and vomiting. She follows with Dr. Darrick Grinder with [facility] with history of microscopic colitis. Current pain is different from  normal. Initial evaluation showed lipase elevated >2,000 and CT abd with concerns for acute on chronic pancreatitis versus recurrent pancreatitis. She doesn't have typical cause for pancreatitis. Drinks only a few drinks weekly and TG were within normal limits. Will also check RUQ U/S for signs of GB disease. For now, she is hungry and ready to eat. She already ate a granola bar while in ED without worsening in pain. Discussed need to avoid fatty or greasy foods today.  -start HH diet -moderate fluid resuscitation -pain control -f/u RUQ U/S -CA19-9 -plan for Abdominal MRI likely as outpatient, Dr. Ninetta Lights communicated new finding from CT to Dr. Leone Payor -PT/ OT  History of microscopic colitis and SIBO Chronic diarrhea She follows with Dr. Leone Payor. Normally has diarrhea a few times daily that makes it difficult to leave her house. She was treated for microscopic colitis and last biopsies were normal. She has also been treated for SIBO without improvement in symptoms. There was concern for exocrine pancreatic insufficiency and Dr. Leone Payor check pancreatic elastase in 2022 and this was >500 so wnl.  Atrial fibrillation Home medications include flecainide, metoprolol succinate and xarelto. She follows  with [facility], previously been cardioverted. -restart flecainide -hold metoprolol today as bradycardic -restart xarelto -flecainide level  Insomnia Home medications include xanax 0.5 mg every day prn.  Diet: Heart Healthy VTE: DOAC IVF: LR,100cc/hr Code: Full  Prior to Admission Living Arrangement: Home, living husband Anticipated Discharge Location: Home Barriers to Discharge: improvement in pain  Dispo: Admit patient to Observation with expected length of stay less than 2 midnights.  Signed: Rudene Christians, DO Internal Medicine Resident PGY-3  03/13/2023, 3:23 PM

## 2023-03-13 NOTE — ED Triage Notes (Signed)
Patient from home with complaints of upper abdominal pain mostly situated under the left breast area, radiating to the left side and left mid back. Reports feeling nauseated,but nothing coming up. Reports incontinence with bowel movements yesterday and loose stools.

## 2023-03-13 NOTE — Consult Note (Addendum)
Consultation  Referring Provider:    Dr. Ninetta Lights  Primary Care Physician:  Patrice Paradise, MD Primary Gastroenterologist:  Dr. Leone Payor       Reason for Consultation:   Pancreatitis           HPI:   [name] is a 77 y.o. female with a past medical history as listed below who presented to the ER today with a complaint of abdominal pain.    At time of presentation patient was accompanied by husband and they describe severe epigastric abdominal pain, apparently initially had pain on Monday, 03/11/2023 but it was mild and she thought it was gas, the next day the pain significantly worsened and started to radiate to her left flank and back as well as up to her neck.  Associated with nausea.  It worsened to the point where she could not get comfortable and she presented to the ER.  Described that she was dancing and fell backwards about a week ago and had a fall and struck her head.  She had had occasional back pain since then.  She is on Xarelto for A-fib.    Today, patient explains that she has always had a "messed up GI tract", she can never predict what her stools can be and lately over the past 3 to 4 weeks she has returned to diarrhea which is sometimes unpredictable and sometimes she has incontinence.  She does have Lomotil at home which she uses as needed, but this is not changed recently.  She does tell me that it has a little bit stronger smell than normal recently almost like "when I had C. difficile".  She did start her half a tablet of Flagyl daily which she has on hand for SIBO over the past week or so and she feels like this helped a little bit with the smell.    More acutely patient started with epigastric pain on 03/12/2023 which she thought was from moving boxes in her garage, she did not think much of it but was awoken the next morning on 8/20 with intense 10/10 pain around 3:00 AM, this seemed to worsen throughout the day and again worsened that night, to the point where she  could not handle it at home.  She grew constantly nauseous and forced herself to vomit 1 time.  Since then she has remained nauseous but did feel a little bit hungry, apparently her husband ordered her a tray of food unknowingly and she did have some mashed potatoes but tells me this made her feel more sick and bloated.    Denies fever, chills or weight loss.  No prior episodes of pancreatitis.  No new medications.  ER course: Lipase 2780, LFTs normal, WBC normal, triglycerides 88; CTAP with pancreas divisum previously suspected on 2017 CT, progressive enlargement and heterogenicity of the pancreatic head and connate since that time and progressive enlargement of the main pancreatic duct, evidence of secondarily inflamed duodenum C-loop, thought to possibly reflect acute on chronic pancreatitis-recommended follow-up abdominal MRI once pancreatitis was resolved  GI history: 07/13/2021 EGD with no endoscopic esophageal abnormality, esophagus empirically dilated 05/19/2021 office visit with Dr. Leone Payor for esophageal dysphagia, SIBO and IBS-mixed; plan:-Repeat EGD with possible dilation for dysphagia, prescribed Metronidazole for SIBO 01/05/2021 colonoscopy with diverticulosis in the sigmoid colon, friable mucosa in the ascending colon and cecum; biopsies were okay, recommended VSL #3 and Lomotil  Past Medical History:  Diagnosis Date   Actinic keratosis 07/13/2020  R lat thigh - bx proven    Arthritis    "fingers; right shoulder"   Atypical mole 12/10/2012   left sacral (severe)   Blood transfusion without reported diagnosis    Broken ribs    C. difficile diarrhea    Cataract    Clostridium difficile colitis 04/01/2016   Collagenous colitis    Coronary Calcium    a. 11/2014 Cardiac CT: Ca2+ score = 50 - all in prox LAD. 69th %'ile for age/sex; b. 05/2015 Ex MV: EF 73%. No infarct/ischemia-->Low risk.   Diverticulosis    Esophageal candidiasis (HCC)    GERD (gastroesophageal reflux disease)     Heart murmur    Hiatal hernia 2004   Hyperlipidemia    IBS (irritable bowel syndrome)    Internal hemorrhoids 2010   Colon-Dr. Mechele Collin    Kidney stones    Midsternal chest pain    a. 01/2011 Ex Mv: EF 68%, No ischemia.   Migraine 01/16/2012   "none now for several years"   Mild Mitral regurgitation    a. 12/2011 Echo: EF 55-60%, no rwma, No evidence of MVP, Mild MR   Osteoporosis    PAF (paroxysmal atrial fibrillation) (HCC)    a. diagnosed 12/2011-->s/p DCCV-->flecainide/Xarelto (CHA2DS2VASc = 2).   Pancreas divisum of native pancreas    probable by CT   Pterygium eye, bilateral    Schatzki's ring 2010    EGD- Dr. Mechele Collin    Small intestinal bacterial overgrowth 06/25/2018   Squamous cell carcinoma of skin 03/31/2014   left mid pretibial, right mid pretibial sup, right mid pretibial inf   Squamous cell carcinoma of skin 05/19/2014   right ant pretibial inf   Squamous cell carcinoma of skin 09/05/2015   left dorsum wrist   Squamous cell carcinoma of skin 11/10/2015   left ant distal thigh   Squamous cell carcinoma of skin 11/26/2017   right prox calf   Squamous cell carcinoma of skin 07/09/2018   left prox lat pretibial   Squamous cell carcinoma of skin 12/02/2018   left pretibial   Squamous cell carcinoma of skin 05/25/2020   R lower leg anterior    Squamous cell carcinoma of skin 07/13/2020   R ant thigh    Squamous cell carcinoma of skin 11/28/2020   Left distal lat pretibial near ankle - EDC   Squamous cell carcinoma of skin 02/22/2021   L med knee - ED&C   Squamous cell carcinoma of skin 02/22/2021   L mid to distal pretibial - ED&C   Squamous cell carcinoma of skin 07/11/2021   Right medial pretibial - EDC   Squamous cell carcinoma of skin 11/15/2021   Right medial thigh - EDC   Squamous cell carcinoma of skin 11/23/2021   right medial ankle, EDC   Squamous cell carcinoma of skin 02/15/2022   R ant ankle - ED&C   Squamous cell carcinoma of skin 02/15/2022    right medial lower leg above ankle inferior - scheduled for ED&C   Squamous cell carcinoma of skin 02/15/2022   right medial lower leg above ankle superior - scheduled for ED&C   Squamous cell carcinoma of skin 07/26/2022   left lateral knee, EDC   Squamous cell carcinoma of skin 02/21/2023   Left anterior thigh. WD SCC with superficial infiltration. EDC.    Past Surgical History:  Procedure Laterality Date   APPENDECTOMY  1963   BREAST BIOPSY     BREAST BIOPSY Right 09/2020   pt had  a bx done in [place] heart clip pt stated neg bx   BREAST CYST ASPIRATION Bilateral    "several; both sides"   CARDIOVERSION  01/16/2012   Procedure: CARDIOVERSION;  Surgeon: Marinus Maw, MD;  Location: [facility] OR;  Service: Cardiovascular;  Laterality: N/A;   COLONOSCOPY  Multiple   CYSTOSCOPY W/ STONE MANIPULATION  1980's   EYE SURGERY     x4   LEG SURGERY     skin cancer removal   OVARIAN CYST REMOVAL  1990's   SHOULDER ARTHROSCOPY W/ ROTATOR CUFF REPAIR Right ~ 2005   SQUAMOUS CELL CARCINOMA EXCISION     "10-12 removed; all over my body - nose, predominately legs"   TONSILLECTOMY AND ADENOIDECTOMY  1975   UPPER GASTROINTESTINAL ENDOSCOPY  Multiple   w/dilation, hiatal hernia   VAGINAL HYSTERECTOMY  1972    Family History  Problem Relation Age of Onset   Alzheimer's disease Mother        Died in [place] 36's.   Heart disease Father        A Fib and CHF - died in Mid 66   Diabetes Father    Kidney disease Father    Irritable bowel syndrome Father    Stomach cancer Neg Hx    Pancreatic cancer Neg Hx    Esophageal cancer Neg Hx    Colon cancer Neg Hx    Rectal cancer Neg Hx     Social History   Tobacco Use   Smoking status: Former    Current packs/day: 0.00    Average packs/day: 0.1 packs/day for 40.0 years (4.0 ttl pk-yrs)    Types: Cigarettes    Start date: 01/16/1972    Quit date: 01/16/2012    Years since quitting: 11.1   Smokeless tobacco: Never  Vaping Use   Vaping status: Never  Used  Substance Use Topics   Alcohol use: Not Currently    Alcohol/week: 0.0 standard drinks of alcohol   Drug use: No    Prior to Admission medications   Medication Sig Start Date End Date Taking? Authorizing Provider  Acetaminophen-Pamabrom (BACKAID MAX PO) Take 1 tablet by mouth as needed.   Yes [provider]  ALPRAZolam Prudy Feeler) 0.5 MG tablet Take 1 tablet by mouth at bedtime as needed. 01/09/21  Yes [provider]  Coenzyme Q10 (COQ10 PO) Take 1 tablet by mouth daily.   Yes [provider]  diphenoxylate-atropine (LOMOTIL) 2.5-0.025 MG tablet TAKE ONE TABLET BY MOUTH FOUR TIMES DAILY AS NEEDED FOR DIARRHEA OR LOOSE STOOLS. 03/12/23  Yes Iva Boop, MD  escitalopram (LEXAPRO) 10 MG tablet Take 1 tablet (10 mg total) by mouth daily. 05/16/18  Yes Boscia, Heather E, NP  EYSUVIS 0.25 % SUSP Apply 1 drop to eye 3 (three) times daily. 03/13/21  Yes [provider]  famotidine (PEPCID) 20 MG tablet Take 20 mg by mouth daily.   Yes [provider]  flecainide (TAMBOCOR) 100 MG tablet TAKE 1 TABLET BY MOUTH TWICE A DAY 09/12/22  Yes Gollan, Tollie Pizza, MD  fluorouracil (EFUDEX) 5 % cream Apply to the left lower leg BID x 7 days. 11/15/21  Yes Deirdre Evener, MD  ibandronate (BONIVA) 150 MG tablet Take 1 tablet (150 mg total) by mouth every 30 (thirty) days. 09/24/19  Yes Boscia, Kathlynn Grate, NP  metoprolol succinate (TOPROL-XL) 25 MG 24 hr tablet Take 1.5 tablets (37.5 mg total) by mouth daily. 09/04/22  Yes Gollan, Tollie Pizza, MD  nitroGLYCERIN (NITROSTAT)  0.4 MG SL tablet Place 1 tablet (0.4 mg total) under the tongue every 5 (five) minutes as needed for chest pain. 02/12/22  Yes Gollan, Tollie Pizza, MD  rivaroxaban (XARELTO) 20 MG TABS tablet TAKE 1 TABLET BY MOUTH ONCE A DAY WITH SUPPER 09/04/22  Yes Gollan, Tollie Pizza, MD  rosuvastatin (CRESTOR) 10 MG tablet Take 1 tablet (10 mg total) by mouth daily. 04/02/22  Yes Antonieta Iba, MD    Current  Facility-Administered Medications  Medication Dose Route Frequency Provider Last Rate Last Admin   acetaminophen (TYLENOL) tablet 1,000 mg  1,000 mg Oral Q6H Masters, Katie, DO       ALPRAZolam Prudy Feeler) tablet 0.5 mg  0.5 mg Oral QHS PRN Masters, Florentina Addison, DO       famotidine (PEPCID) tablet 20 mg  20 mg Oral Daily Masters, Katie, DO   20 mg at 03/13/23 1253   flecainide (TAMBOCOR) tablet 100 mg  100 mg Oral BID Masters, Katie, DO       lactated ringers infusion   Intravenous Continuous Masters, Katie, DO 100 mL/hr at 03/13/23 1249 New Bag at 03/13/23 1249   oxyCODONE (Oxy IR/ROXICODONE) immediate release tablet 5 mg  5 mg Oral Q6H PRN Masters, Florentina Addison, DO   5 mg at 03/13/23 1405   rivaroxaban (XARELTO) tablet 20 mg  20 mg Oral Daily Masters, Katie, DO       rosuvastatin (CRESTOR) tablet 10 mg  10 mg Oral Daily Masters, Katie, DO   10 mg at 03/13/23 1251    Allergies as of 03/13/2023 - Review Complete 03/13/2023  Allergen Reaction Noted   Latex  03/06/2012   Adhesive [tape] Rash 03/06/2012   Aspirin Other (See Comments)    Nicotinamide [niacinamide] Rash 11/15/2021   Prednisone Palpitations 06/08/2014     Review of Systems:    Constitutional: No weight loss, fever or chills Skin: No rash  Cardiovascular: No chest pain Respiratory: No SOB  Gastrointestinal: See HPI and otherwise negative Genitourinary: No dysuria  Neurological: No headache, dizziness or syncope Musculoskeletal: No new muscle or joint pain Hematologic: No bleeding  Psychiatric: No history of depression or anxiety    Physical Exam:  Vital signs in last 24 hours: Temp:  [97.6 F (36.4 C)-98.2 F (36.8 C)] 97.7 F (36.5 C) (08/21 1143) Pulse Rate:  [51-57] 53 (08/21 1143) Resp:  [16-22] 18 (08/21 1143) BP: (106-145)/(55-77) 124/60 (08/21 1143) SpO2:  [99 %-100 %] 100 % (08/21 1143) Weight:  [72 kg] 72 kg (08/21 0353) Last BM Date : 03/13/23 (per pt report) General:   Pleasant Elderly Caucasian female appears to be  in NAD, Well developed, Well nourished, alert and cooperative Head:  Normocephalic and atraumatic. Eyes:   PEERL, EOMI. No icterus. Conjunctiva pink. Ears:  Normal auditory acuity. Neck:  Supple Throat: Oral cavity and pharynx without inflammation, swelling or lesion. Teeth in good condition. Lungs: Respirations even and unlabored. Lungs clear to auscultation bilaterally.   No wheezes, crackles, or rhonchi.  Heart: Normal S1, S2. No MRG. Regular rate and rhythm. No peripheral edema, cyanosis or pallor.  Abdomen:  Soft, nondistended, marked epigastric TTP with involuntary guarding, normal bowel sounds. No appreciable masses or hepatomegaly. Rectal:  Not performed.  Msk:  Symmetrical without gross deformities. Peripheral pulses intact.  Extremities:  Without edema, no deformity or joint abnormality. Normal ROM, normal sensation. Neurologic:  Alert and  oriented x4;  grossly normal neurologically.  Skin:   Dry and intact without significant lesions or rashes. Psychiatric: Demonstrates  good judgement and reason without abnormal affect or behaviors.   LAB RESULTS: Recent Labs    03/13/23 0400  WBC 9.4  HGB 12.2  HCT 37.7  PLT 214   BMET Recent Labs    03/13/23 0400  NA 137  K 3.8  CL 105  CO2 25  GLUCOSE 101*  BUN 13  CREATININE 1.00  CALCIUM 8.7*   LFT Recent Labs    03/13/23 0400  PROT 6.6  ALBUMIN 3.6  AST 19  ALT 15  ALKPHOS 67  BILITOT 0.5    STUDIES: CT ABDOMEN PELVIS W CONTRAST  Result Date: 03/13/2023 CLINICAL DATA:  77 year old female status post fall 1 week ago striking head. On Xarelto. Abdomen and flank pain. EXAM: CT ABDOMEN AND PELVIS WITH CONTRAST TECHNIQUE: Multidetector CT imaging of the abdomen and pelvis was performed using the standard protocol following bolus administration of intravenous contrast. RADIATION DOSE REDUCTION: This exam was performed according to the departmental dose-optimization program which includes automated exposure control,  adjustment of the mA and/or kV according to patient size and/or use of iterative reconstruction technique. CONTRAST:  75mL OMNIPAQUE IOHEXOL 350 MG/ML SOLN COMPARISON:  CT Abdomen 03/01/2016. FINDINGS: Lower chest: Negative; minor dependent atelectasis. Hepatobiliary: Negative liver and gallbladder. Pancreas: Pancreas divisum suspected on the 2017 CT, and there has been progressive dilatation of the main pancreatic duct since that time (series 3, image 25 in the body) and there is an enlarged and heterogeneous appearance of the pancreatic head and uncinate now. Lobulated nearly 2 cm area of hypodensity in the uncinate is new since 2017 (coronal image 101). And both the head and the uncinate appear indistinct. No convincing pancreatic body or tail atrophy. No peripancreatic fluid collection. Some secondary inflammation of the duodenum C-loop is suspected. Spleen: Negative. Adrenals/Urinary Tract: Normal adrenal glands. Symmetric kidneys, renal enhancement, and contrast excretion. Decompressed ureters. Small chronic and benign appearing renal cysts (no follow-up imaging recommended). Unremarkable bladder. Incidental pelvic phleboliths. Stomach/Bowel: Mild diverticulosis of the large bowel in the sigmoid colon. Mild redundancy and retained stool otherwise. Diminutive or absent appendix. No large bowel inflammation. Negative terminal ileum and no dilated small bowel. Decompressed stomach and duodenum. Some secondary inflammation of the duodenum, see pancreas above. No free air or free fluid. Vascular/Lymphatic: Aortoiliac calcified atherosclerosis. Normal caliber abdominal aorta. Major arterial structures remain patent. Portal venous system is patent. No lymphadenopathy identified. Reproductive: Surgically absent uterus. Diminutive or absent ovaries Other: No pelvis free fluid. Musculoskeletal: No acute osseous abnormality identified. IMPRESSION: 1. Abnormal Pancreas. Pancreas divisum previously suspected on a 2017 CT.  Progressive enlargement and heterogeneity of the pancreatic head and uncinate since that time, and progressive enlargement of the main pancreatic duct. Evidence of secondarily inflamed duodenum C-loop. This constellation may reflect Acute On Chronic/recurrent Pancreatitis. However, a cystic mass of the uncinate is difficult to exclude. If there are clinical signs/symptoms of acute pancreatitis then recommend appropriate treatment and a follow-up Abdomen MRI (pancreas mass protocol without and with contrast) once the patient has recovered. 2. No other acute process or recent traumatic injury identified in the abdomen or pelvis. Aortic Atherosclerosis (ICD10-I70.0). Electronically Signed   By: Odessa Fleming M.D.   On: 03/13/2023 08:02   CT Head Wo Contrast  Result Date: 03/13/2023 CLINICAL DATA:  77 year old female status post fall 1 week ago striking head. On Xarelto. Persistent headache. EXAM: CT HEAD WITHOUT CONTRAST TECHNIQUE: Contiguous axial images were obtained from the base of the skull through the vertex without intravenous contrast. RADIATION DOSE  REDUCTION: This exam was performed according to the departmental dose-optimization program which includes automated exposure control, adjustment of the mA and/or kV according to patient size and/or use of iterative reconstruction technique. COMPARISON:  Brain MRI 06/08/2008. FINDINGS: Brain: Cerebral volume within normal limits for age, mildly decreased since the 2009 MRI. No midline shift, ventriculomegaly, mass effect, evidence of mass lesion, intracranial hemorrhage or evidence of cortically based acute infarction. Gray-white differentiation appears symmetric and normal for age. Vascular: No suspicious intracranial vascular hyperdensity. Mild Calcified atherosclerosis at the skull base. Skull: No fracture identified. Sinuses/Orbits: Visualized paranasal sinuses and mastoids are clear. Other: No orbit or scalp soft tissue injury identified. IMPRESSION: No recent  traumatic injury identified. Normal for age non contrast CT appearance of the brain. Electronically Signed   By: Odessa Fleming M.D.   On: 03/13/2023 07:51   DG Chest Port 1 View  Result Date: 03/13/2023 CLINICAL DATA:  77 year old female with chest and abdominal pain. EXAM: PORTABLE CHEST 1 VIEW COMPARISON:  Cardiac CT 02/19/2022. Chest radiographs 12/11/2015 and earlier. FINDINGS: Portable AP semi upright view at 0629 hours. Lung volumes are chronically at the upper limits of normal. Normal cardiac size and mediastinal contours. Visualized tracheal air column is within normal limits. Allowing for portable technique the lungs are clear. No acute osseous abnormality identified. No pneumoperitoneum evident below the diaphragm. IMPRESSION: Negative portable chest. Electronically Signed   By: Odessa Fleming M.D.   On: 03/13/2023 06:49     Impression / Plan:   Impression: 1.  Pancreatitis: Acute onset epigastric pain with nausea and vomiting, lipase 2780 and CT with acute on chronic pancreatitis; unknown etiology at the moment, triglycerides normal, CT concerning for possible underlying neoplasm, though with inflammation cannot decipher 2.  A-fib: On Xarelto 3.  IBS mixed: Lately more diarrhea with a foul smell, also history of SIBO  Plan: 1.  Agree with aggressive fluids and analgesics as well as as needed antiemetics  2.  Patient will need follow-up MRI likely after resolution of acute pancreatitis to evaluate abnormality 3.  Patient feels hungry and would like to be on clear liquids, discussed that if she takes sips of things or eat something and it makes her feel worse then she needs to stop eating.  She has been drinking ginger ale with no adverse side effects.  Thank you for your kind consultation, we will continue to follow.  [facility]  03/13/2023, 3:22 PM  I have taken an interval history, thoroughly reviewed the chart and examined the patient. I agree with the Advanced Practitioner's note,  impression and recommendations, and have recorded additional findings, impressions and recommendations below. I performed a substantive portion of this encounter (>50% time spent), including a complete performance of the medical decision making.  My additional thoughts are as follows:  First episode of acute idiopathic pancreatitis.  No alcohol use, and doubt biliary source without gallbladder stones/ductal dilatation on imaging and with normal LFTs.  Triglycerides normal, calcium normal Known P divisum, possibly contributing but hard to know for sure.  Cannot determine if any cystic or other neoplasm type in the pancreatic head with the acute inflammation.  This would seem to be an unusual presentation for pancreatic neoplasm.  Outpatient MRI/MRCP after resolution of pancreatitis.  IV fluids (she looks euvolemic at present), as needed analgesic and antiemetic, bowel rest except sips of ice chips and water at present.  Patient and husband reassured, all questions answered.  Thanks to housestaff and we will follow. I  will update Dr. Leone Payor her primary GI physician.   Charlie Pitter III [phone]

## 2023-03-13 NOTE — Evaluation (Signed)
Physical Therapy Evaluation Patient Details Name: [name] MRN: [id number] DOB: 11-29-1945 Today's Date: 03/13/2023  History of Present Illness  77 y.o. female presents to [facility] hospital on 03/13/2023 with abdominal pain and diarrhea. CT abdomen with concern for acute on chronic pancreatitis. PMH includes microscopic colitis, PAF, SCC of skin.  Clinical Impression  Pt presents to PT with deficits in power, balance, endurance, and with continued abdominal pain. Pt reports some generalized weakness compared to baseline, having limited sleep and PO intake in recent days due to abdominal discomfort. Pt is currently able to ambulate for household distances without DME or physical assistance. Pt also reports recent upper back and neck pain, PT provides exercises in an effort to improve posture. Pt will benefit from intermittent follow-up during admission. PT recommends outpatient PT at the time of discharge in an effort to furtehr treat neck and back pain.        If plan is discharge home, recommend the following: A little help with walking and/or transfers;A little help with bathing/dressing/bathroom;Assistance with cooking/housework;Help with stairs or ramp for entrance   Can travel by private vehicle        Equipment Recommendations None recommended by PT  Recommendations for Other Services       Functional Status Assessment Patient has had a recent decline in their functional status and demonstrates the ability to make significant improvements in function in a reasonable and predictable amount of time.     Precautions / Restrictions Precautions Precautions: Fall Restrictions Weight Bearing Restrictions: No      Mobility  Bed Mobility Overal bed mobility: Modified Independent                  Transfers Overall transfer level: Independent                      Ambulation/Gait Ambulation/Gait assistance: Supervision Gait Distance (Feet): 250 Feet Assistive  device: None Gait Pattern/deviations: Step-through pattern Gait velocity: functional Gait velocity interpretation: 1.31 - 2.62 ft/sec, indicative of limited community ambulator   General Gait Details: slowed step-through gait, mild increase in lateral drift  Stairs            Wheelchair Mobility     Tilt Bed    Modified Rankin (Stroke Patients Only)       Balance Overall balance assessment: Needs assistance Sitting-balance support: No upper extremity supported, Feet supported Sitting balance-Leahy Scale: Good     Standing balance support: No upper extremity supported, During functional activity Standing balance-Leahy Scale: Good                               Pertinent Vitals/Pain Pain Assessment Pain Assessment: 0-10 Pain Score: 3  Pain Location: abdomen Pain Descriptors / Indicators: Aching Pain Intervention(s): Monitored during session    Home Living Family/patient expects to be discharged to:: Private residence Living Arrangements: Spouse/significant other Available Help at Discharge: Family;Available 24 hours/day Type of Home: House Home Access: Level entry       Home Layout: Two level;Able to live on main level with bedroom/bathroom Home Equipment: Rolling Walker (2 wheels);Cane - single point;BSC/3in1      Prior Function Prior Level of Function : Independent/Modified Independent;Driving             Mobility Comments: ambulatory without DME, has been less active over the last year due to eye surgery       Extremity/Trunk Assessment  Upper Extremity Assessment Upper Extremity Assessment: Overall WFL for tasks assessed    Lower Extremity Assessment Lower Extremity Assessment: Overall WFL for tasks assessed    Cervical / Trunk Assessment Cervical / Trunk Assessment: Normal  Communication   Communication Communication: No apparent difficulties Cueing Techniques: Verbal cues  Cognition Arousal: Alert Behavior During  Therapy: WFL for tasks assessed/performed Overall Cognitive Status: Within Functional Limits for tasks assessed                                          General Comments General comments (skin integrity, edema, etc.): VSS on RA    Exercises Other Exercises Other Exercises: cervical retraction and scapular retraction   Assessment/Plan    PT Assessment Patient needs continued PT services  PT Problem List Decreased activity tolerance;Decreased balance;Decreased mobility;Pain       PT Treatment Interventions DME instruction;Gait training;Stair training;Functional mobility training;Balance training;Patient/family education;Therapeutic exercise;Therapeutic activities    PT Goals (Current goals can be found in the Care Plan section)  Acute Rehab PT Goals Patient Stated Goal: to reduce pain PT Goal Formulation: With patient Time For Goal Achievement: 03/27/23 Potential to Achieve Goals: Good Additional Goals Additional Goal #1: Pt will score >45/56 on the BERG to indicate a reduced risk for falls Additional Goal #2: Pt will score >19/24 on the DGI to indicate a reduced risk for falls    Frequency Min 1X/week     Co-evaluation               AM-PAC PT "6 Clicks" Mobility  Outcome Measure Help needed turning from your back to your side while in a flat bed without using bedrails?: None Help needed moving from lying on your back to sitting on the side of a flat bed without using bedrails?: None Help needed moving to and from a bed to a chair (including a wheelchair)?: None Help needed standing up from a chair using your arms (e.g., wheelchair or bedside chair)?: None Help needed to walk in hospital room?: A Little Help needed climbing 3-5 steps with a railing? : A Little 6 Click Score: 22    End of Session   Activity Tolerance: Patient tolerated treatment well Patient left: in bed;with call bell/phone within reach;with nursing/sitter in room Nurse  Communication: Mobility status PT Visit Diagnosis: Other abnormalities of gait and mobility (R26.89);Pain Pain - part of body:  (abdomen)    Time: 1610-9604 PT Time Calculation (min) (ACUTE ONLY): 29 min   Charges:   PT Evaluation $PT Eval Low Complexity: 1 Low   PT General Charges $$ ACUTE PT VISIT: 1 Visit         Arlyss Gandy, PT, DPT Acute Rehabilitation Office [phone]   Arlyss Gandy 03/13/2023, 5:17 PM

## 2023-03-13 NOTE — ED Provider Notes (Signed)
7:13 AM Care assumed from Dr. Madilyn Hook.  At time of transfer of care, patient is waiting for results of CT abdomen and pelvis and CT head to look for traumatic injuries from recent fall that may have contributed to this new pancreatitis.  Plan of care is to admit for acute pancreatitis after imaging is completed.  9:19 AM CT scan returned with evidence of abnormal pancreas with swelling and inflammation concerning for acute pancreatitis but also cannot rule out a mass.  MRI was recommended after pancreatitis has improved.  Otherwise no other acute findings described  for abdomen/pelvis.  CT head did not show evidence of acute traumatic injury at this time.  Chest x-ray also negative.  Patient continues to have some discomfort.  Previous team's plan was to admit for further management of new pancreatitis with nausea vomiting and pain.   [name], Canary Brim, MD 03/13/23 1005

## 2023-03-13 NOTE — ED Notes (Signed)
Floor notified that the pt will be transported upstairs

## 2023-03-13 NOTE — ED Notes (Signed)
Help patient out of the bathroom wheeled her back in bed on the monitor with family at bedside

## 2023-03-14 DIAGNOSIS — G4700 Insomnia, unspecified: Secondary | ICD-10-CM | POA: Diagnosis not present

## 2023-03-14 DIAGNOSIS — Z886 Allergy status to analgesic agent status: Secondary | ICD-10-CM | POA: Diagnosis not present

## 2023-03-14 DIAGNOSIS — Z888 Allergy status to other drugs, medicaments and biological substances status: Secondary | ICD-10-CM | POA: Diagnosis not present

## 2023-03-14 DIAGNOSIS — K219 Gastro-esophageal reflux disease without esophagitis: Secondary | ICD-10-CM | POA: Diagnosis not present

## 2023-03-14 DIAGNOSIS — M19042 Primary osteoarthritis, left hand: Secondary | ICD-10-CM | POA: Diagnosis not present

## 2023-03-14 DIAGNOSIS — I4891 Unspecified atrial fibrillation: Secondary | ICD-10-CM | POA: Diagnosis not present

## 2023-03-14 DIAGNOSIS — R1013 Epigastric pain: Secondary | ICD-10-CM

## 2023-03-14 DIAGNOSIS — E785 Hyperlipidemia, unspecified: Secondary | ICD-10-CM | POA: Diagnosis not present

## 2023-03-14 DIAGNOSIS — F419 Anxiety disorder, unspecified: Secondary | ICD-10-CM | POA: Diagnosis not present

## 2023-03-14 DIAGNOSIS — Z79899 Other long term (current) drug therapy: Secondary | ICD-10-CM | POA: Diagnosis not present

## 2023-03-14 DIAGNOSIS — Z85828 Personal history of other malignant neoplasm of skin: Secondary | ICD-10-CM | POA: Diagnosis not present

## 2023-03-14 DIAGNOSIS — K859 Acute pancreatitis without necrosis or infection, unspecified: Secondary | ICD-10-CM | POA: Diagnosis not present

## 2023-03-14 DIAGNOSIS — I959 Hypotension, unspecified: Secondary | ICD-10-CM | POA: Diagnosis not present

## 2023-03-14 DIAGNOSIS — R109 Unspecified abdominal pain: Secondary | ICD-10-CM | POA: Diagnosis not present

## 2023-03-14 DIAGNOSIS — Z91040 Latex allergy status: Secondary | ICD-10-CM | POA: Diagnosis not present

## 2023-03-14 DIAGNOSIS — K8500 Idiopathic acute pancreatitis without necrosis or infection: Secondary | ICD-10-CM | POA: Diagnosis not present

## 2023-03-14 DIAGNOSIS — Z841 Family history of disorders of kidney and ureter: Secondary | ICD-10-CM | POA: Diagnosis not present

## 2023-03-14 DIAGNOSIS — Z87891 Personal history of nicotine dependence: Secondary | ICD-10-CM | POA: Diagnosis not present

## 2023-03-14 DIAGNOSIS — K582 Mixed irritable bowel syndrome: Secondary | ICD-10-CM | POA: Diagnosis not present

## 2023-03-14 DIAGNOSIS — Z7901 Long term (current) use of anticoagulants: Secondary | ICD-10-CM | POA: Diagnosis not present

## 2023-03-14 DIAGNOSIS — R112 Nausea with vomiting, unspecified: Secondary | ICD-10-CM | POA: Diagnosis not present

## 2023-03-14 DIAGNOSIS — Z7983 Long term (current) use of bisphosphonates: Secondary | ICD-10-CM | POA: Diagnosis not present

## 2023-03-14 DIAGNOSIS — M19041 Primary osteoarthritis, right hand: Secondary | ICD-10-CM | POA: Diagnosis not present

## 2023-03-14 DIAGNOSIS — I480 Paroxysmal atrial fibrillation: Secondary | ICD-10-CM | POA: Diagnosis not present

## 2023-03-14 DIAGNOSIS — Z9049 Acquired absence of other specified parts of digestive tract: Secondary | ICD-10-CM | POA: Diagnosis not present

## 2023-03-14 DIAGNOSIS — Z8249 Family history of ischemic heart disease and other diseases of the circulatory system: Secondary | ICD-10-CM | POA: Diagnosis not present

## 2023-03-14 DIAGNOSIS — M810 Age-related osteoporosis without current pathological fracture: Secondary | ICD-10-CM | POA: Diagnosis not present

## 2023-03-14 DIAGNOSIS — Z90710 Acquired absence of both cervix and uterus: Secondary | ICD-10-CM | POA: Diagnosis not present

## 2023-03-14 DIAGNOSIS — K638219 Small intestinal bacterial overgrowth, unspecified: Secondary | ICD-10-CM | POA: Diagnosis not present

## 2023-03-14 LAB — CBC
HCT: 32.2 % — ABNORMAL LOW (ref 36.0–46.0)
Hemoglobin: 10.6 g/dL — ABNORMAL LOW (ref 12.0–15.0)
MCH: 30.4 pg (ref 26.0–34.0)
MCHC: 32.9 g/dL (ref 30.0–36.0)
MCV: 92.3 fL (ref 80.0–100.0)
Platelets: 170 10*3/uL (ref 150–400)
RBC: 3.49 MIL/uL — ABNORMAL LOW (ref 3.87–5.11)
RDW: 13.6 % (ref 11.5–15.5)
WBC: 8.1 10*3/uL (ref 4.0–10.5)
nRBC: 0 % (ref 0.0–0.2)

## 2023-03-14 LAB — BASIC METABOLIC PANEL
Anion gap: 6 (ref 5–15)
BUN: 10 mg/dL (ref 8–23)
CO2: 24 mmol/L (ref 22–32)
Calcium: 8.2 mg/dL — ABNORMAL LOW (ref 8.9–10.3)
Chloride: 108 mmol/L (ref 98–111)
Creatinine, Ser: 0.9 mg/dL (ref 0.44–1.00)
GFR, Estimated: >60 mL/min (ref >60)
Glucose, Bld: 82 mg/dL (ref 70–99)
Potassium: 3.9 mmol/L (ref 3.5–5.1)
Sodium: 138 mmol/L (ref 135–145)

## 2023-03-14 LAB — CANCER ANTIGEN 19-9: CA 19-9: 16 U/mL (ref 0–35)

## 2023-03-14 MED ORDER — LACTATED RINGERS IV SOLN: INTRAVENOUS | Status: AC

## 2023-03-14 MED ORDER — SIMETHICONE 80 MG PO CHEW
80.0000 mg | CHEWABLE_TABLET | Freq: Every day | ORAL | Status: DC
  Administered 2023-03-14 – 2023-03-15 (×2): 80 mg via ORAL
  Filled 2023-03-14 (×2): qty 1

## 2023-03-14 NOTE — Progress Notes (Signed)
Pt complained of indigestion, burning sensation. This is not new to the pt. Pt stated that she gets like this when she goes too long without eating. To relieve these symptoms she usually snacks of gingersnaps or some type of crackers and it usually goes away. Dr. Rayvon Char notified per above. Dr. Rip Harbour to have some crackers. Later assessed pt, tolerated crackers fine. Complaints of indigestion/burning sensation ceased.

## 2023-03-14 NOTE — Care Management Obs Status (Signed)
MEDICARE OBSERVATION STATUS NOTIFICATION   Patient Details  Name: [name] MRN: [id number] Date of Birth: 19-Sep-1945   Medicare Observation Status Notification Given:  Yes    Harriet Masson, RN 03/14/2023, 1:43 PM

## 2023-03-14 NOTE — Progress Notes (Addendum)
Progress Note   Subjective  Hospital day #2 Chief Complaint: Pancreatitis  This morning I met in the room with the patient's husband as well as her daughter on the phone.  There is some confusion apparently about all the things we discussed yesterday.  We had discussed leaving her on a clear liquid diet at least for the first 24 hours to allow her pancreas time to rest, but apparently this was changed to a soft diet and she had a baked potato last night.  This did not really cause her any increase in epigastric discomfort, nausea or vomiting but per her daughter on the phone her mom was complaining she could not even sit up this morning without abdominal pain.  Patient tells me this feels more like trapped gas under her rib cage and if she could have a good burp then it would go away.  No nausea or vomiting.  There is just concerned about what to do best to allow her pancreas to rest.  Also some continued concern about abnormal imaging and question of mass.  Overall though pain level has decreased from a 10 to a 5.   Objective   Vital signs in last 24 hours: Temp:  [98 F (36.7 C)-98.1 F (36.7 C)] 98.1 F (36.7 C) (08/22 0842) Pulse Rate:  [50-58] 58 (08/22 0842) Resp:  [18] 18 (08/22 0842) BP: (85-118)/(46-63) 118/63 (08/22 0842) SpO2:  [97 %-100 %] 100 % (08/22 0842) Last BM Date : 03/13/23 General:    Elderly white female in NAD Heart:  Regular rate and rhythm; no murmurs Lungs: Respirations even and unlabored, lungs CTA bilaterally Abdomen:  Soft, moderate epigastric ttp and nondistended. Normal bowel sounds. Psych:  Cooperative. Normal mood and affect.  Intake/Output from previous day: 08/21 0701 - 08/22 0700 In: 1193.1 [P.O.:600; I.V.:593.1] Out: -    Lab Results: Recent Labs    03/13/23 0400 03/14/23 0154  WBC 9.4 8.1  HGB 12.2 10.6*  HCT 37.7 32.2*  PLT 214 170   BMET Recent Labs    03/13/23 0400 03/14/23 0154  NA 137 138  K 3.8 3.9  CL 105 108  CO2 25  24  GLUCOSE 101* 82  BUN 13 10  CREATININE 1.00 0.90  CALCIUM 8.7* 8.2*   LFT Recent Labs    03/13/23 0400  PROT 6.6  ALBUMIN 3.6  AST 19  ALT 15  ALKPHOS 67  BILITOT 0.5   Studies/Results: US Abdomen Limited RUQ (LIVER/GB)  Result Date: 03/13/2023 CLINICAL DATA:  Acute pancreatitis EXAM: ULTRASOUND ABDOMEN LIMITED RIGHT UPPER QUADRANT COMPARISON:  CT abdomen and pelvis 03/13/2023 FINDINGS: Gallbladder: No gallstones or wall thickening visualized. No sonographic Murphy sign noted by sonographer. Common bile duct: Diameter: 2.7 mm Liver: No focal lesion identified. There is increase in parenchymal echogenicity. Portal vein is patent on color Doppler imaging with normal direction of blood flow towards the liver. Other: Pancreatic duct is dilated measuring 4 mm as seen on prior CT. IMPRESSION: 1. No cholelithiasis or sonographic evidence of acute cholecystitis. 2. Pancreatic duct dilation as seen on prior CT. 3. Fatty liver. Electronically Signed   By: Darliss Cheney M.D.   On: 03/13/2023 23:00   CT ABDOMEN PELVIS W CONTRAST  Result Date: 03/13/2023 CLINICAL DATA:  77 year old female status post fall 1 week ago striking head. On Xarelto. Abdomen and flank pain. EXAM: CT ABDOMEN AND PELVIS WITH CONTRAST TECHNIQUE: Multidetector CT imaging of the abdomen and pelvis was performed using the standard protocol  following bolus administration of intravenous contrast. RADIATION DOSE REDUCTION: This exam was performed according to the departmental dose-optimization program which includes automated exposure control, adjustment of the mA and/or kV according to patient size and/or use of iterative reconstruction technique. CONTRAST:  75mL OMNIPAQUE IOHEXOL 350 MG/ML SOLN COMPARISON:  CT Abdomen 03/01/2016. FINDINGS: Lower chest: Negative; minor dependent atelectasis. Hepatobiliary: Negative liver and gallbladder. Pancreas: Pancreas divisum suspected on the 2017 CT, and there has been progressive dilatation of  the main pancreatic duct since that time (series 3, image 25 in the body) and there is an enlarged and heterogeneous appearance of the pancreatic head and uncinate now. Lobulated nearly 2 cm area of hypodensity in the uncinate is new since 2017 (coronal image 101). And both the head and the uncinate appear indistinct. No convincing pancreatic body or tail atrophy. No peripancreatic fluid collection. Some secondary inflammation of the duodenum C-loop is suspected. Spleen: Negative. Adrenals/Urinary Tract: Normal adrenal glands. Symmetric kidneys, renal enhancement, and contrast excretion. Decompressed ureters. Small chronic and benign appearing renal cysts (no follow-up imaging recommended). Unremarkable bladder. Incidental pelvic phleboliths. Stomach/Bowel: Mild diverticulosis of the large bowel in the sigmoid colon. Mild redundancy and retained stool otherwise. Diminutive or absent appendix. No large bowel inflammation. Negative terminal ileum and no dilated small bowel. Decompressed stomach and duodenum. Some secondary inflammation of the duodenum, see pancreas above. No free air or free fluid. Vascular/Lymphatic: Aortoiliac calcified atherosclerosis. Normal caliber abdominal aorta. Major arterial structures remain patent. Portal venous system is patent. No lymphadenopathy identified. Reproductive: Surgically absent uterus. Diminutive or absent ovaries Other: No pelvis free fluid. Musculoskeletal: No acute osseous abnormality identified. IMPRESSION: 1. Abnormal Pancreas. Pancreas divisum previously suspected on a 2017 CT. Progressive enlargement and heterogeneity of the pancreatic head and uncinate since that time, and progressive enlargement of the main pancreatic duct. Evidence of secondarily inflamed duodenum C-loop. This constellation may reflect Acute On Chronic/recurrent Pancreatitis. However, a cystic mass of the uncinate is difficult to exclude. If there are clinical signs/symptoms of acute pancreatitis  then recommend appropriate treatment and a follow-up Abdomen MRI (pancreas mass protocol without and with contrast) once the patient has recovered. 2. No other acute process or recent traumatic injury identified in the abdomen or pelvis. Aortic Atherosclerosis (ICD10-I70.0). Electronically Signed   By: Odessa Fleming M.D.   On: 03/13/2023 08:02   CT Head Wo Contrast  Result Date: 03/13/2023 CLINICAL DATA:  77 year old female status post fall 1 week ago striking head. On Xarelto. Persistent headache. EXAM: CT HEAD WITHOUT CONTRAST TECHNIQUE: Contiguous axial images were obtained from the base of the skull through the vertex without intravenous contrast. RADIATION DOSE REDUCTION: This exam was performed according to the departmental dose-optimization program which includes automated exposure control, adjustment of the mA and/or kV according to patient size and/or use of iterative reconstruction technique. COMPARISON:  Brain MRI 06/08/2008. FINDINGS: Brain: Cerebral volume within normal limits for age, mildly decreased since the 2009 MRI. No midline shift, ventriculomegaly, mass effect, evidence of mass lesion, intracranial hemorrhage or evidence of cortically based acute infarction. Gray-white differentiation appears symmetric and normal for age. Vascular: No suspicious intracranial vascular hyperdensity. Mild Calcified atherosclerosis at the skull base. Skull: No fracture identified. Sinuses/Orbits: Visualized paranasal sinuses and mastoids are clear. Other: No orbit or scalp soft tissue injury identified. IMPRESSION: No recent traumatic injury identified. Normal for age non contrast CT appearance of the brain. Electronically Signed   By: Odessa Fleming M.D.   On: 03/13/2023 07:51   DG Chest [facility]  1 View  Result Date: 03/13/2023 CLINICAL DATA:  77 year old female with chest and abdominal pain. EXAM: PORTABLE CHEST 1 VIEW COMPARISON:  Cardiac CT 02/19/2022. Chest radiographs 12/11/2015 and earlier. FINDINGS: Portable AP semi  upright view at 0629 hours. Lung volumes are chronically at the upper limits of normal. Normal cardiac size and mediastinal contours. Visualized tracheal air column is within normal limits. Allowing for portable technique the lungs are clear. No acute osseous abnormality identified. No pneumoperitoneum evident below the diaphragm. IMPRESSION: Negative portable chest. Electronically Signed   By: Odessa Fleming M.D.   On: 03/13/2023 06:49       Assessment / Plan:   Assessment: 1.  Pancreatitis: Acute onset epigastric pain with nausea and vomiting, lipase 2780 and a CT with acute on chronic pancreatitis, no alcohol use and doubt biliary source without gallbladder stone/ductal dilation on imaging and normal LFTs, triglycerides normal, calcium normal, known pancreatic divisum which could be contributing 2.  A-fib: On Xarelto 3.  IBS mixed: Lately more diarrhea with a foul smell, also history of SIBO  Plan: 1.  Continue fluids and analgesics as well as add as needed antiemetics 2.  Had a long discussion about diet with the patient today.  Seems as though she is still really having some epigastric pain which is likely related to her known pancreatitis, so would really recommend that she stay on clear liquids for the next 24 hours, then we can advance as she tolerates.  This made her daughter happy. 3.  Again discussed abnormal CT concerning for underlying neoplasm, CA 19-9 done by the hospitalist team was normal which is reassuring.  Explained that we will need to do an MRI/MRCP with pancreatic protocol in a few months after resolution of all of the inflammation.  Tried to answer her questions. 4.  Patient and her husband overly concerned about the lipase number.  Tried to discuss this with them, but for reassurance we will go ahead and order a repeat lipase in the morning.  Thank you for your kind consultation, we will continue to follow along.   LOS: 0 days   Unk Lightning  03/14/2023, 12:33 PM  I  have taken an interval history, thoroughly reviewed the chart and examined the patient. I agree with the Advanced Practitioner's note, impression and recommendations, and have recorded additional findings, impressions and recommendations below. I performed a substantive portion of this encounter (>50% time spent), including a complete performance of the medical decision making.  My additional thoughts are as follows:  She had an episode of pain yesterday but improved today.  Overall clinically improved from admission.  No fever, GI bleeding, ileus or renal dysfunction.  Multiple questions and concerns from her and her husband were again addressed, and I would keep her on a clear liquid diet until tomorrow.  We will order lipase for tomorrow morning and I reassured her that she will have close outpatient follow-up for repeat imaging. She was concerned because the medical residents indicated they were getting at blood test that might help determine if the pancreatic condition is cancerous (?  CA 19-9).  If that was ordered, I let her know there is the possibility it could come back elevated because of the acute pancreatitis.  We will keep all test results in mind and interpret them in the right context as her condition improves both in the inpatient and outpatient setting.  Charlie Pitter III Office:[phone]

## 2023-03-14 NOTE — Hospital Course (Signed)
Mild abdominal pai, mild nausea. No vomiting.

## 2023-03-14 NOTE — TOC Initial Note (Addendum)
Transition of Care [facility]) - Initial/Assessment Note    Patient Details  Name: [name] MRN: [id number] Date of Birth: 1946-05-22  Transition of Care [facility]) CM/SW Contact:    Harriet Masson, RN Phone Number: 03/14/2023, 4:04 PM  Clinical Narrative:                  Spoke to patient regarding transition needs. Patient is agreeable to OP rehab PT. Patient choose [facility]. Referral has been sent. Patient has transportation. TOC following.  Expected Discharge Plan: OP Rehab Barriers to Discharge: Continued Medical Work up   Patient Goals and CMS Choice Patient states their goals for this hospitalization and ongoing recovery are:: return home          Expected Discharge Plan and Services       Living arrangements for the past 2 months: Single Family Home                                      Prior Living Arrangements/Services Living arrangements for the past 2 months: Single Family Home Lives with:: Spouse Patient language and need for interpreter reviewed:: Yes Do you feel safe going back to the place where you live?: Yes      Need for Family Participation in Patient Care: Yes (Comment) Care giver support system in place?: Yes (comment)   Criminal Activity/Legal Involvement Pertinent to Current Situation/Hospitalization: No - Comment as needed  Activities of Daily Living Home Assistive Devices/Equipment: None ADL Screening (condition at time of admission) Patient's cognitive ability adequate to safely complete daily activities?: Yes Is the patient deaf or have difficulty hearing?: No Does the patient have difficulty seeing, even when wearing glasses/contacts?: No Does the patient have difficulty concentrating, remembering, or making decisions?: No Patient able to express need for assistance with ADLs?: No Does the patient have difficulty dressing or bathing?: No Independently performs ADLs?: Yes (appropriate for developmental age) Does the patient  have difficulty walking or climbing stairs?: No Weakness of Legs: Both Weakness of Arms/Hands: None  Permission Sought/Granted                  Emotional Assessment Appearance:: Appears stated age Attitude/Demeanor/Rapport: Gracious Affect (typically observed): Accepting Orientation: : Oriented to Self, Oriented to Place, Oriented to  Time, Oriented to Situation Alcohol / Substance Use: Not Applicable Psych Involvement: No (comment)  Admission diagnosis:  Acute pancreatitis [K85.90] Abdominal pain, unspecified abdominal location [R10.9] Acute pancreatitis, unspecified complication status, unspecified pancreatitis type [K85.90] Nausea and vomiting, unspecified vomiting type [R11.2] Patient Active Problem List   Diagnosis Date Noted   Abdominal pain, epigastric 03/14/2023   Acute pancreatitis 03/13/2023   Nausea and vomiting 03/13/2023   Diarrhea 12/02/2020   Flatulence 12/02/2020   Bloating 12/02/2020   Chronic anticoagulation 12/02/2020   Abdominal pain 12/02/2020   Small intestinal bacterial overgrowth 06/25/2018   Chronic atrial fibrillation (HCC) 05/16/2018   Ovarian failure 05/16/2018   Need for vaccination against Streptococcus pneumoniae using pneumococcal conjugate vaccine 13 05/16/2018   Vitamin D deficiency 05/16/2018   Urinary tract infection with hematuria 11/27/2017   Dysuria 11/27/2017   Generalized anxiety disorder 11/27/2017   Paroxysmal atrial fibrillation (HCC) 12/08/2016   Lactose intolerance 08/06/2016   Cellulitis 11/08/2015   Screening for breast cancer 02/22/2014   Anxiety 02/25/2012   Smoking 01/23/2012   Chest pain 02/08/2011   GERD with stricture 12/29/2008   Irritable bowel  syndrome with both constipation and diarrhea 12/29/2008   OSTEOPOROSIS 12/29/2008   Tachycardia 12/29/2008   PALPITATIONS 12/29/2008   Collagenous colitis 10/08/2007   PCP:  Patrice Paradise, MD Pharmacy:   [phone] [facility], [place] - [address] [place] [address] Phone: [phone] Fax: [phone]  [facility] - [place], [place] - [address] [place] [address] Phone: [phone] Fax: [phone]  [id number] - [place], [place] - [address] AT [facility] [address] [place] [address] Phone: [phone] Fax: [phone]  [facility] - [place], [place] - [address] [place] [address] Phone: [phone] Fax: [phone]  [id number] - [facility] - [address] [place] [address] Phone: [phone] Fax: [phone]     Social Determinants of Health (SDOH) Social History: SDOH Screenings   Food Insecurity: No Food Insecurity (03/13/2023)  Housing: Patient Declined (03/13/2023)  Transportation Needs: No Transportation Needs (03/13/2023)  Utilities: Not At Risk (03/13/2023)  Alcohol Screen: Low Risk  (11/21/2018)  Depression (PHQ2-9): Low Risk  (11/21/2018)  Tobacco Use: Medium Risk (03/13/2023)   SDOH Interventions:     Readmission Risk Interventions     No data to display

## 2023-03-14 NOTE — Evaluation (Signed)
Occupational Therapy Evaluation Patient Details Name: [name] MRN: [id number] DOB: 01/10/1946 Today's Date: 03/14/2023   History of Present Illness 77 y.o. female [facility] with abdominal pain and diarrhea. CT abdomen with concern for acute on chronic pancreatitis. PMH includes microscopic colitis, PAF, SCC of skin.   Clinical Impression   Pt admitted for above, PTA she reports being fully independent. Upon OT entry, pt not pleased with lack of communication regarding her POC, asking OT several questions regarding team plan regarding pancreatitis. OT reading information from 8/21 Gastro note then messaged Attending and Gastro team to address pt/family concerns. Pt mobilizing without assist, continues to have neck and back pain. Educated pt on the use of modalities and stretches to help reduce ongoing pain. OT to follow-up with patient for another session to reassess benefits of modalities and reinforce stretches. Pt has no follow-up OT needs at this time.        If plan is discharge home, recommend the following: Other (comment) (none)    Functional Status Assessment  Patient has had a recent decline in their functional status and demonstrates the ability to make significant improvements in function in a reasonable and predictable amount of time.  Equipment Recommendations  None recommended by OT    Recommendations for Other Services       Precautions / Restrictions Precautions Precautions: Fall Restrictions Weight Bearing Restrictions: No      Mobility Bed Mobility               General bed mobility comments: Pt in recliner upon OT entry    Transfers Overall transfer level: Independent                        Balance Overall balance assessment: Needs assistance Sitting-balance support: No upper extremity supported, Feet supported Sitting balance-Leahy Scale: Good     Standing balance support: No upper extremity supported,  During functional activity Standing balance-Leahy Scale: Good                             ADL either performed or assessed with clinical judgement   ADL Overall ADL's : Needs assistance/impaired Eating/Feeding: Independent;Sitting   Grooming: Standing;Supervision/safety       Lower Body Bathing: Independent;Sitting/lateral leans   Upper Body Dressing : Independent;Sitting   Lower Body Dressing: Supervision/safety;Sit to/from stand   Toilet Transfer: Supervision/safety;Ambulation   Toileting- Clothing Manipulation and Hygiene: Supervision/safety         General ADL Comments: Educated pt on the use of heat and ice to help with back and neck pain, supplementing with scapular retraction exercises     Vision         Perception         Praxis         Pertinent Vitals/Pain Pain Assessment Pain Assessment: Faces Faces Pain Scale: Hurts little more Pain Location: neck, back, abdomen Pain Descriptors / Indicators: Aching Pain Intervention(s): Monitored during session, Repositioned     Extremity/Trunk Assessment Upper Extremity Assessment Upper Extremity Assessment: Overall WFL for tasks assessed   Lower Extremity Assessment Lower Extremity Assessment: Overall WFL for tasks assessed   Cervical / Trunk Assessment Cervical / Trunk Assessment: Normal   Communication Communication Communication: No apparent difficulties   Cognition Arousal: Alert Behavior During Therapy: WFL for tasks assessed/performed Overall Cognitive Status: Within Functional Limits for tasks assessed  General Comments       Exercises Other Exercises Other Exercises: scapulaer retraction   Shoulder Instructions      Home Living Family/patient expects to be discharged to:: Private residence Living Arrangements: Spouse/significant other Available Help at Discharge: Family;Available 24 hours/day Type of Home: House Home  Access: Level entry     Home Layout: Two level;Able to live on main level with bedroom/bathroom               Home Equipment: Rolling Walker (2 wheels);Cane - single point;BSC/3in1          Prior Functioning/Environment Prior Level of Function : Independent/Modified Independent;Driving             Mobility Comments: ambulatory without DME, has been less active over the last year due to eye surgery ADLs Comments: ind        OT Problem List: Pain      OT Treatment/Interventions: Self-care/ADL training;Balance training;Therapeutic exercise;Therapeutic activities;DME and/or AE instruction;Patient/family education    OT Goals(Current goals can be found in the care plan section) Acute Rehab OT Goals Patient Stated Goal: To go home OT Goal Formulation: With patient Time For Goal Achievement: 03/28/23 Potential to Achieve Goals: Good ADL Goals Pt Will Perform Lower Body Bathing: Independently;sitting/lateral leans Pt Will Transfer to Toilet: Independently Pt/caregiver will Perform Home Exercise Program: With written HEP provided;Independently (to promote tissue elasticity, helping reduce neck and back pain) Additional ADL Goal #1: Pt will demonstrate independent use of modalities to help with neck and back pain  OT Frequency: Min 1X/week    Co-evaluation              AM-PAC OT "6 Clicks" Daily Activity     Outcome Measure Help from another person eating meals?: None Help from another person taking care of personal grooming?: None Help from another person toileting, which includes using toliet, bedpan, or urinal?: None Help from another person bathing (including washing, rinsing, drying)?: None Help from another person to put on and taking off regular upper body clothing?: None Help from another person to put on and taking off regular lower body clothing?: None 6 Click Score: 24   End of Session Nurse Communication: Mobility status  Activity Tolerance: Patient  tolerated treatment well Patient left: in chair;with call bell/phone within reach;with family/visitor present  OT Visit Diagnosis: History of falling (Z91.81)                Time: 8413-2440 OT Time Calculation (min): 47 min Charges:  OT General Charges $OT Visit: 1 Visit OT Evaluation $OT Eval Moderate Complexity: 1 Mod OT Treatments $Therapeutic Activity: 23-37 mins  03/14/2023  AB, OTR/L  Acute Rehabilitation Services  Office: [phone]   Tristan Schroeder 03/14/2023, 11:01 AM

## 2023-03-14 NOTE — Progress Notes (Signed)
Subjective:   Summary: [name] is a 77 y.o. year old female currently admitted on the IMTS HD#1 for Acute pancreatitis.  HPI: 77 year old female with a significant past medical history of Afib (on xarelto, flecainide, and metoprolol succinate), multiple episodes SCC with frequent removal, microscopic colitis/IBD, Gerd, C.diff, HLD, anxiety, and recent osteoporosis on bisphosphonate therapy and reclast infusions. She presented to the ED with severe epigastric pain that woke her up at 2am. It was accompanied with vomiting, persistent nausea and dry heaving. She describes the pain as radiating around to the left flank and up to her neck posteriorly. She reports due to her IBD she never has regular bowel movements and alternates between diarrhea and constipation. Denies any cough, hemoptysis, night sweats, weight loss. Never left the county but is going to [facility] Dec 28th - April 15th for vacation.  Overnight Events:   Patient reported that she was not able to sleep well due to frequent awaking and uncomfortable beds. Pain however that brought her in has resolved and only endorsees gas pain that she has frequently had and requested her simethicone.  Vitals overnight included bradycardia however the patient states her HR is always between 50-60.  Also was hypotensive so metoprolol has been held during her admission.     Objective:  Vital signs in last 24 hours: Vitals:   03/13/23 1143 03/13/23 1719 03/14/23 0510 03/14/23 0842  BP: 124/60 (!) 99/46 (!) 85/49 118/63  Pulse: (!) 53 (!) 50 (!) 57 (!) 58  Resp: 18 18 18 18   Temp: 97.7 F (36.5 C) 98 F (36.7 C) 98 F (36.7 C) 98.1 F (36.7 C)  TempSrc: Oral Oral  Oral  SpO2: 100% 97% 97% 100%  Weight:      Height:       Supplemental O2: Bag Valve Mask SpO2: 100 %   Physical Exam:  Constitutional: well-appearing female sitting in bed, in no acute distress Cardiovascular: RRR, no murmurs, rubs or  gallops Pulmonary/Chest: normal work of breathing on room air, lungs clear to auscultation bilaterally Abdominal: soft, non-tender, non-distended, minimal tenderness in LLQ. Normal bowel sounds. Skin: warm and dry Extremities: upper/lower extremity pulses 2+, no lower extremity edema present  [facility] Weights   03/13/23 0353  Weight: 72 kg     Intake/Output Summary (Last 24 hours) at 03/14/2023 1204 Last data filed at 03/13/2023 1900 Gross per 24 hour  Intake 1193.13 ml  Output --  Net 1193.13 ml   Net IO Since Admission: 1,193.13 mL [03/14/23 1204]  Pertinent Labs:    Latest Ref Rng & Units 03/14/2023    1:54 AM 03/13/2023    4:00 AM 03/31/2021   10:15 AM  CBC  WBC 4.0 - 10.5 K/uL 8.1  9.4  5.9   Hemoglobin 12.0 - 15.0 g/dL 69.6  29.5  28.4   Hematocrit 36.0 - 46.0 % 32.2  37.7  38.8   Platelets 150 - 400 K/uL 170  214  219        Latest Ref Rng & Units 03/14/2023    1:54 AM 03/13/2023    4:00 AM 02/14/2022    8:30 AM  CMP  Glucose 70 - 99 mg/dL 82  132  92   BUN 8 - 23 mg/dL 10  13  18    Creatinine 0.44 - 1.00 mg/dL 4.40  1.02  7.25   Sodium 135 - 145 mmol/L  138  137  139   Potassium 3.5 - 5.1 mmol/L 3.9  3.8  4.2   Chloride 98 - 111 mmol/L 108  105  104   CO2 22 - 32 mmol/L 24  25  29    Calcium 8.9 - 10.3 mg/dL 8.2  8.7  9.0   Total Protein 6.5 - 8.1 g/dL  6.6    Total Bilirubin 0.3 - 1.2 mg/dL  0.5    Alkaline Phos 38 - 126 U/L  67    AST 15 - 41 U/L  19    ALT 0 - 44 U/L  15      I personally reviewed interval labs and pertinent results include: CBC, BMP, Ca19-9 was wnl (patient relieved but understands does not r/o cancer MRI outpatient)  Imaging: US Abdomen Limited RUQ (LIVER/GB)  Result Date: 03/13/2023 CLINICAL DATA:  Acute pancreatitis EXAM: ULTRASOUND ABDOMEN LIMITED RIGHT UPPER QUADRANT COMPARISON:  CT abdomen and pelvis 03/13/2023 FINDINGS: Gallbladder: No gallstones or wall thickening visualized. No sonographic Murphy sign noted by sonographer. Common  bile duct: Diameter: 2.7 mm Liver: No focal lesion identified. There is increase in parenchymal echogenicity. Portal vein is patent on color Doppler imaging with normal direction of blood flow towards the liver. Other: Pancreatic duct is dilated measuring 4 mm as seen on prior CT. IMPRESSION: 1. No cholelithiasis or sonographic evidence of acute cholecystitis. 2. Pancreatic duct dilation as seen on prior CT. 3. Fatty liver. Electronically Signed   By: Darliss Cheney M.D.   On: 03/13/2023 23:00   I personally reviewed interval imaging and my interpretation is as follows: Idiopathic Acute pancreatitis  EKG: My EKG interpretation is as follows: no new EKG on file  Assessment/Plan:   Active Problems:   Abdominal pain   Nausea and vomiting   Patient Summary: [name] is a 77 y.o. with a pertinent PMH of Afib (on xarelto, flecainide, and metoprolol succinate), multiple episodes SCC with frequent removal, microscopic colitis/IBD, Gerd, C.diff, HLD, anxiety, and recent osteoporosis on bisphosphonate therapy and reclast infusions, who presented with Epigastric abdominal pain, nausea and vomiting and admitted for pancreatitis management.    # Idiopathic Acute Pancreatitis  Considered possible causes for patients pancreatitis including but not limited to Drug induced, Malignancy, biliary pancreatitis and autoimmune, and infectious etiologies. Bisphosphates loose associated with pancreatitis nut considering its only recent med change it may have contributed, additionally flecainide has a loose association. Calling it idiopathic for now pending further work up. Patient rarely drinks alcohol and does not use tobacco. Patient symptoms largely resolved was eating at the time of my exam. Patient initially confused on diet and if she could eat, patient reassured if she can tolerate diet she may advance it.  - U/S No cholestasis of sonographic evidence of cholecystis  - Advance clear liquid diet as patient can  tolerate  - Ca19-9 was WNL  Follow up with flecainide level - Will need outpatient MRI pancreas with mass protocol w/ + w/o contrast - May need MRCP outpatient as well both imaging in a 3-5 months allowing for pancreatitis inflammation to resolve  - Continue with current pain management and nausea meds - Lipase repeated at patiens request, told it will take time to decrease endorses she would like it ordered. - Patient believes she will be ready for discharge at 11am 8/23 assuming can tolerate rest of her food without symptoms -Gastroenterology was consulted and following, thank you for your recommendations and time.  # Atrial Fibrillation (Rate Controlled) -  on flecainide and metoprolol at home however metoprolol has been d/c in hospital due to patient bradycardia and hypotension  CHRONIC:  # Anxiety  Continue home xanax 0.5mg  prn   # Urinary Symptoms Patient endorses that for the last 1-2 years she has had urinary frequency and urgency. Additionally while using the bathroom states she has incomplete bladder emptying. - Advised to follow up with urogyn for possible bladder prolapse.   # Osteoporosis -c/w home meds # HLD -c/w home meds   Diet: Clear Liquid ADVANCE as tolerated IVF: NS, VTE: patient on xarelto  Code: Unknown PT/OT recs: None, none. Family Update: Husband at bedside and daughter contacted on the phone while in patients room    Dispo: Anticipated discharge to Home in 1 days pending able to tolerate diet.   Larrie Kass MS4 Medical student Pager Number [phone] Please contact the on call pager after 5 pm and on weekends at [phone].

## 2023-03-14 NOTE — Progress Notes (Signed)
PT Cancellation Note  Patient Details Name: [name] MRN: [id number] DOB: [birth year]   Cancelled Treatment:    Reason Eval/Treat Not Completed: (P) Other (comment) (pt poliltely defers and states she has recently been up ambulating in hallway with assist from family and just returned to room.) Will continue efforts per PT plan of care as schedule permits.   [name] 03/14/2023, 5:06 PM

## 2023-03-15 ENCOUNTER — Telehealth: Payer: Self-pay

## 2023-03-15 DIAGNOSIS — K859 Acute pancreatitis without necrosis or infection, unspecified: Secondary | ICD-10-CM

## 2023-03-15 DIAGNOSIS — K219 Gastro-esophageal reflux disease without esophagitis: Secondary | ICD-10-CM | POA: Diagnosis not present

## 2023-03-15 DIAGNOSIS — Z8719 Personal history of other diseases of the digestive system: Secondary | ICD-10-CM

## 2023-03-15 DIAGNOSIS — K8689 Other specified diseases of pancreas: Secondary | ICD-10-CM

## 2023-03-15 LAB — BASIC METABOLIC PANEL
Anion gap: 9 (ref 5–15)
BUN: 9 mg/dL (ref 8–23)
CO2: 21 mmol/L — ABNORMAL LOW (ref 22–32)
Calcium: 8.2 mg/dL — ABNORMAL LOW (ref 8.9–10.3)
Chloride: 107 mmol/L (ref 98–111)
Creatinine, Ser: 0.83 mg/dL (ref 0.44–1.00)
GFR, Estimated: >60 mL/min (ref >60)
Glucose, Bld: 83 mg/dL (ref 70–99)
Potassium: 3.6 mmol/L (ref 3.5–5.1)
Sodium: 137 mmol/L (ref 135–145)

## 2023-03-15 LAB — LIPASE, BLOOD: Lipase: 93 U/L — ABNORMAL HIGH (ref 11–51)

## 2023-03-15 LAB — CBC
HCT: 33.3 % — ABNORMAL LOW (ref 36.0–46.0)
Hemoglobin: 10.8 g/dL — ABNORMAL LOW (ref 12.0–15.0)
MCH: 29.3 pg (ref 26.0–34.0)
MCHC: 32.4 g/dL (ref 30.0–36.0)
MCV: 90.5 fL (ref 80.0–100.0)
Platelets: 183 10*3/uL (ref 150–400)
RBC: 3.68 MIL/uL — ABNORMAL LOW (ref 3.87–5.11)
RDW: 13.4 % (ref 11.5–15.5)
WBC: 6.9 10*3/uL (ref 4.0–10.5)
nRBC: 0 % (ref 0.0–0.2)

## 2023-03-15 NOTE — Discharge Instructions (Addendum)
Hello [name] it was a pleasure meeting you and your husband, and I appreciate being able to be apart of your treatment team. You were seen in the hospital for Pancreatitis. You were found to have an enlarged pancreas and the pancreatic divism (the two ducts) and due to all the inflammation from the pancreatitis we discussed the importance of the MRI follow up.  During your time in the Hospital we held your metoprolol since your heart rate was low and blood pressure was sometimes low, it seems the flecanide is controlling your heart rate well in treating your atrial fibrillation. We advise you stop taking this medication and schedule an appointment with your cardiologist to discuss this change.

## 2023-03-15 NOTE — Progress Notes (Signed)
PT Cancellation Note  Patient Details Name: [name] MRN: [id number] DOB: 12/28/1945   Cancelled Treatment:    Reason Eval/Treat Not Completed: (P) Patient declined, no reason specified (Pt politely defers PT session at this time. Pt is hopeful to DC home soon.)   Angus Palms 03/15/2023, 11:10 AM

## 2023-03-15 NOTE — Discharge Summary (Addendum)
Name: [name] MRN: [id number] DOB: [birth year] 77 y.o. PCP: Patrice Paradise, MD  Date of Admission: 03/13/2023  3:41 AM Date of Discharge: 03/15/2023 Attending Physician: Ginnie Smart, MD  Discharge Diagnosis: 1. Principal Problem:   Acute pancreatitis Active Problems:   Gastroesophageal reflux disease   Abdominal pain   Nausea and vomiting   Abdominal pain, epigastric   Pancreatic mass  Idiopathic Acute Pancreatitis   Discharge Medications: Allergies as of 03/15/2023       Reactions   Latex    Adhesive [tape] Rash   Aspirin Other (See Comments)   Aggravates Acid Reflux   Nicotinamide [niacinamide] Rash   Prednisone Palpitations   Made pts heart race        Medication List     STOP taking these medications    BACKAID MAX PO   metoprolol succinate 25 MG 24 hr tablet Commonly known as: TOPROL-XL       TAKE these medications    ALPRAZolam 0.5 MG tablet Commonly known as: XANAX Take 1 tablet by mouth at bedtime as needed.   COQ10 PO Take 1 tablet by mouth daily.   diphenoxylate-atropine 2.5-0.025 MG tablet Commonly known as: LOMOTIL TAKE ONE TABLET BY MOUTH FOUR TIMES DAILY AS NEEDED FOR DIARRHEA OR LOOSE STOOLS.   escitalopram 10 MG tablet Commonly known as: LEXAPRO Take 1 tablet (10 mg total) by mouth daily.   Eysuvis 0.25 % Susp Generic drug: Loteprednol Etabonate Apply 1 drop to eye 3 (three) times daily.   famotidine 20 MG tablet Commonly known as: PEPCID Take 20 mg by mouth daily.   flecainide 100 MG tablet Commonly known as: TAMBOCOR TAKE 1 TABLET BY MOUTH TWICE A DAY   fluorouracil 5 % cream Commonly known as: EFUDEX Apply to the left lower leg BID x 7 days.   ibandronate 150 MG tablet Commonly known as: BONIVA Take 1 tablet (150 mg total) by mouth every 30 (thirty) days.   nitroGLYCERIN 0.4 MG SL tablet Commonly known as: NITROSTAT Place 1 tablet (0.4 mg total) under the tongue every 5 (five) minutes as needed  for chest pain.   rivaroxaban 20 MG Tabs tablet Commonly known as: Xarelto TAKE 1 TABLET BY MOUTH ONCE A DAY WITH SUPPER   rosuvastatin 10 MG tablet Commonly known as: CRESTOR Take 1 tablet (10 mg total) by mouth daily.        Disposition and follow-up:   [name] was discharged from [facility] in Good condition.  At the hospital follow up visit please address:  1.  Follow up with:   PCP- Address Metoprolol discontinued, address patients urinary complaints incomplete emptying. Discuss if patient needs colonoscopy of egd.  Gastroenterology- Outpatient MRI/MRCP needs scheduling (Patient requesting prior to Dec28th vacation) Order MRI of pancreatic mass with and without contrast.  2.  Labs / imaging needed at time of follow-up: MRI/MRCP possible EGD for patients history of achalasia with balloon dilation.  3.  Pending labs/ test needing follow-up: flecainide level  Follow-up Appointments:  Follow-up Information     [facility] Follow up.   Specialty: Rehabilitation Why: Call to schedule apt for out patient physical thearp Contact information: [address] [place] [address] [phone]                Hospital Course by problem list: 1. Idiopathic Acute Pancreatitis  Patient presented on 8/21 with severe epigastric pain that woke her up  at 2am, accompanied with vomiting, and persistent nausea. She received a CT scan in the ED as well as labs including CBC, CMP, Lipase, EKG, troponin, lipids. She was started on IV fluids and given pain medications and admitted for pancreatitis which was seen on CT. Clear liquid diet and advance as tolerated was done, patient was able to eat the same day as admission without pain. GI was consulted and followed the patient during her stay. PT/OT saw the patient and they recommended out patient rehab. Patient was relieved the lipase was 93 on day of  discharge, patient voiced understanding that this as well as a negative ca19-9 does not rule out cancer and she understands she needs the MRI in future.   - Follow up out patient with GI for MRI/MRCP  - Continue your home simethicone for gas pain - Schedule outpatient PT  2. Atrial Fibrillation (Rate Controlled) Patient received EKGs and vitals monitored, was found to be bradycardic and hypotensive early in admission, metoprolol was held during this admission and heart rate and blood pressure were well controlled without any events. - Stop taking metoprolol for now - Continue Flecanide and continue anticoagulation with xarelto.  - Follow up with PCP/ Dr. Merlinda Frederick  - Follow up with Cardiologist/ Dr. Mariah Milling   Chronic Problems: 3.GERD/Achalasia Hx Patient developed gas pains and had relief with food as well as belching. - Continue home pepcid, simethicone, and tums - Address your concerns about gas relief at your GI follow up apt  4. HLD/Osteoporosis/Anxiety - Continue home meds  Discharge Subjective: Patient seen by the team at bedside this morning, patient endorsed and episode of reflux like symptoms overnight and states she had them before and had relief when she had some crackers. Patient tolerated liquids and soft diet well and was able to advance to regular diet today. She denies any pancreatic pain but felt she has gas pain still with some relief when she takes simthicone and her pepcid.  Re-evaluated patient 2 hours later, she states she was able to eat Eggs and yogurt and toast. Denies any pain from this but stated she felt like she got full quickly.  Told patient lipase was 93 which was a large relief to her and her husband and they stated they were ready for discharge, requesting before 5 pm traffic if possible.  Discharge Exam:   BP 133/60 (BP Location: Left Arm)   Pulse (!) 58   Temp 98 F (36.7 C) (Oral)   Resp 16   Ht 5\' 7"  (1.702 m)   Wt 72 kg   SpO2 100%   BMI  24.86 kg/m  Physical Exam Constitutional:      General: She is not in acute distress.    Appearance: She is well-developed and normal weight.  Cardiovascular:     Rate and Rhythm: Normal rate and regular rhythm.     Heart sounds: Normal heart sounds.  Pulmonary:     Effort: Pulmonary effort is normal.     Breath sounds: Normal breath sounds.  Abdominal:     General: Abdomen is flat. There is no distension.     Tenderness: There is abdominal tenderness in the epigastric area and left lower quadrant. There is no guarding or rebound. Negative signs include Murphy's sign and McBurney's sign.  Neurological:     Mental Status: She is alert.     Pertinent Labs, Studies, and Procedures:     Latest Ref Rng & Units 03/15/2023    7:48 AM 03/14/2023  1:54 AM 03/13/2023    4:00 AM  CBC  WBC 4.0 - 10.5 K/uL 6.9  8.1  9.4   Hemoglobin 12.0 - 15.0 g/dL 40.9  81.1  91.4   Hematocrit 36.0 - 46.0 % 33.3  32.2  37.7   Platelets 150 - 400 K/uL 183  170  214        Latest Ref Rng & Units 03/15/2023    7:48 AM 03/14/2023    1:54 AM 03/13/2023    4:00 AM  CMP  Glucose 70 - 99 mg/dL 83  82  782   BUN 8 - 23 mg/dL 9  10  13    Creatinine 0.44 - 1.00 mg/dL 9.56  2.13  0.86   Sodium 135 - 145 mmol/L 137  138  137   Potassium 3.5 - 5.1 mmol/L 3.6  3.9  3.8   Chloride 98 - 111 mmol/L 107  108  105   CO2 22 - 32 mmol/L 21  24  25    Calcium 8.9 - 10.3 mg/dL 8.2  8.2  8.7   Total Protein 6.5 - 8.1 g/dL   6.6   Total Bilirubin 0.3 - 1.2 mg/dL   0.5   Alkaline Phos 38 - 126 U/L   67   AST 15 - 41 U/L   19   ALT 0 - 44 U/L   15    US Abdomen Limited RUQ (LIVER/GB)  Result Date: 03/13/2023 CLINICAL DATA:  Acute pancreatitis EXAM: ULTRASOUND ABDOMEN LIMITED RIGHT UPPER QUADRANT COMPARISON:  CT abdomen and pelvis 03/13/2023 FINDINGS: Gallbladder: No gallstones or wall thickening visualized. No sonographic Murphy sign noted by sonographer. Common bile duct: Diameter: 2.7 mm Liver: No focal lesion identified.  There is increase in parenchymal echogenicity. Portal vein is patent on color Doppler imaging with normal direction of blood flow towards the liver. Other: Pancreatic duct is dilated measuring 4 mm as seen on prior CT. IMPRESSION: 1. No cholelithiasis or sonographic evidence of acute cholecystitis. 2. Pancreatic duct dilation as seen on prior CT. 3. Fatty liver. Electronically Signed   By: Darliss Cheney M.D.   On: 03/13/2023 23:00   CT ABDOMEN PELVIS W CONTRAST  Result Date: 03/13/2023 CLINICAL DATA:  77 year old female status post fall 1 week ago striking head. On Xarelto. Abdomen and flank pain. EXAM: CT ABDOMEN AND PELVIS WITH CONTRAST TECHNIQUE: Multidetector CT imaging of the abdomen and pelvis was performed using the standard protocol following bolus administration of intravenous contrast. RADIATION DOSE REDUCTION: This exam was performed according to the departmental dose-optimization program which includes automated exposure control, adjustment of the mA and/or kV according to patient size and/or use of iterative reconstruction technique. CONTRAST:  75mL OMNIPAQUE IOHEXOL 350 MG/ML SOLN COMPARISON:  CT Abdomen 03/01/2016. FINDINGS: Lower chest: Negative; minor dependent atelectasis. Hepatobiliary: Negative liver and gallbladder. Pancreas: Pancreas divisum suspected on the 2017 CT, and there has been progressive dilatation of the main pancreatic duct since that time (series 3, image 25 in the body) and there is an enlarged and heterogeneous appearance of the pancreatic head and uncinate now. Lobulated nearly 2 cm area of hypodensity in the uncinate is new since 2017 (coronal image 101). And both the head and the uncinate appear indistinct. No convincing pancreatic body or tail atrophy. No peripancreatic fluid collection. Some secondary inflammation of the duodenum C-loop is suspected. Spleen: Negative. Adrenals/Urinary Tract: Normal adrenal glands. Symmetric kidneys, renal enhancement, and contrast  excretion. Decompressed ureters. Small chronic and benign appearing renal cysts (no follow-up  imaging recommended). Unremarkable bladder. Incidental pelvic phleboliths. Stomach/Bowel: Mild diverticulosis of the large bowel in the sigmoid colon. Mild redundancy and retained stool otherwise. Diminutive or absent appendix. No large bowel inflammation. Negative terminal ileum and no dilated small bowel. Decompressed stomach and duodenum. Some secondary inflammation of the duodenum, see pancreas above. No free air or free fluid. Vascular/Lymphatic: Aortoiliac calcified atherosclerosis. Normal caliber abdominal aorta. Major arterial structures remain patent. Portal venous system is patent. No lymphadenopathy identified. Reproductive: Surgically absent uterus. Diminutive or absent ovaries Other: No pelvis free fluid. Musculoskeletal: No acute osseous abnormality identified. IMPRESSION: 1. Abnormal Pancreas. Pancreas divisum previously suspected on a 2017 CT. Progressive enlargement and heterogeneity of the pancreatic head and uncinate since that time, and progressive enlargement of the main pancreatic duct. Evidence of secondarily inflamed duodenum C-loop. This constellation may reflect Acute On Chronic/recurrent Pancreatitis. However, a cystic mass of the uncinate is difficult to exclude. If there are clinical signs/symptoms of acute pancreatitis then recommend appropriate treatment and a follow-up Abdomen MRI (pancreas mass protocol without and with contrast) once the patient has recovered. 2. No other acute process or recent traumatic injury identified in the abdomen or pelvis. Aortic Atherosclerosis (ICD10-I70.0). Electronically Signed   By: Odessa Fleming M.D.   On: 03/13/2023 08:02   CT Head Wo Contrast  Result Date: 03/13/2023 CLINICAL DATA:  77 year old female status post fall 1 week ago striking head. On Xarelto. Persistent headache. EXAM: CT HEAD WITHOUT CONTRAST TECHNIQUE: Contiguous axial images were obtained from  the base of the skull through the vertex without intravenous contrast. RADIATION DOSE REDUCTION: This exam was performed according to the departmental dose-optimization program which includes automated exposure control, adjustment of the mA and/or kV according to patient size and/or use of iterative reconstruction technique. COMPARISON:  Brain MRI 06/08/2008. FINDINGS: Brain: Cerebral volume within normal limits for age, mildly decreased since the 2009 MRI. No midline shift, ventriculomegaly, mass effect, evidence of mass lesion, intracranial hemorrhage or evidence of cortically based acute infarction. Gray-white differentiation appears symmetric and normal for age. Vascular: No suspicious intracranial vascular hyperdensity. Mild Calcified atherosclerosis at the skull base. Skull: No fracture identified. Sinuses/Orbits: Visualized paranasal sinuses and mastoids are clear. Other: No orbit or scalp soft tissue injury identified. IMPRESSION: No recent traumatic injury identified. Normal for age non contrast CT appearance of the brain. Electronically Signed   By: Odessa Fleming M.D.   On: 03/13/2023 07:51   DG Chest Port 1 View  Result Date: 03/13/2023 CLINICAL DATA:  77 year old female with chest and abdominal pain. EXAM: PORTABLE CHEST 1 VIEW COMPARISON:  Cardiac CT 02/19/2022. Chest radiographs 12/11/2015 and earlier. FINDINGS: Portable AP semi upright view at 0629 hours. Lung volumes are chronically at the upper limits of normal. Normal cardiac size and mediastinal contours. Visualized tracheal air column is within normal limits. Allowing for portable technique the lungs are clear. No acute osseous abnormality identified. No pneumoperitoneum evident below the diaphragm. IMPRESSION: Negative portable chest. Electronically Signed   By: Odessa Fleming M.D.   On: 03/13/2023 06:49      Discharge Instructions: Discharge Instructions     Ambulatory referral to Physical Therapy   Complete by: As directed    Diet - low sodium  heart healthy   Complete by: As directed    Discharge instructions   Complete by: As directed    [name], [name] were recently admitted to [facility] for inflammation of your pancreas.  I am glad you are feeling better.  Please  avoid fatty foods for the next fews days and gradually increase amount you are eating.  You need to follow-up with GI and will need repeat imaging of your belly in a few months.     Continue taking your home medications with the following changes: Stop taking- 1. Metoprolol    your heart rate was low while in hospital so I recommend following up with cardiology prior to restarting this.  You should seek further medical care if you are not able to eat due to pain or nausea.  We recommend that you see your primary care doctor in about a week to make sure that you continue to improve. We are so glad that you are feeling better.  Sincerely, Katie Masters, DO   Increase activity slowly   Complete by: As directed        Signed: Martie Round, Medical Student 03/15/2023, 10:58 AM   Pager: [phone]

## 2023-03-15 NOTE — Telephone Encounter (Signed)
Iva Boop, MD  Unk Lightning, [place]; Missy Sabins, RN Not sure if there is a pancteatic protocol for MR but there is one for CT - just an FYI in case there is no option for that   ----- Message ----- From: Unk Lightning, PA Sent: 03/15/2023  12:37 PM EDT To: Iva Boop, MD; Missy Sabins, RN Subject: MRI and follow-up                              [name]-patient needs an MRI/MRCP with pancreatic protocol in 2 months.  She will then need follow-up with Dr. Leone Payor in the clinic after to discuss results and current IBS symptoms.  Thank you, Hyacinth Meeker, PA-C

## 2023-03-15 NOTE — Progress Notes (Addendum)
Progress Note   Subjective  Hospital day #3 Chief Complaint: Pancreatitis.  Today, patient is found with her bags packed, apparently she has been discharged.  She did tolerate a low-fat diet this morning.  She continues with some gas and has not been able to have a bowel movement over the past 48 hours, but overall feels much better.  She is aware of plans for follow-up in our office after an MRI.   Objective   Vital signs in last 24 hours: Temp:  [98 F (36.7 C)-98.4 F (36.9 C)] 98 F (36.7 C) (08/23 0815) Pulse Rate:  [51-61] 58 (08/23 0815) Resp:  [16-18] 16 (08/23 0815) BP: (113-143)/(54-67) 133/60 (08/23 0815) SpO2:  [100 %] 100 % (08/23 0815) Last BM Date : 03/13/23 General:    Elderly white female in NAD Heart:  Regular rate and rhythm; no murmurs Lungs: Respirations even and unlabored, lungs CTA bilaterally Abdomen:  Soft, mild epigastric ttp and nondistended. Normal bowel sounds. Psych:  Cooperative. Normal mood and affect.  Lab Results: Recent Labs    03/13/23 0400 03/14/23 0154 03/15/23 0748  WBC 9.4 8.1 6.9  HGB 12.2 10.6* 10.8*  HCT 37.7 32.2* 33.3*  PLT 214 170 183   BMET Recent Labs    03/13/23 0400 03/14/23 0154 03/15/23 0748  NA 137 138 137  K 3.8 3.9 3.6  CL 105 108 107  CO2 25 24 21*  GLUCOSE 101* 82 83  BUN 13 10 9   CREATININE 1.00 0.90 0.83  CALCIUM 8.7* 8.2* 8.2*   LFT Recent Labs    03/13/23 0400  PROT 6.6  ALBUMIN 3.6  AST 19  ALT 15  ALKPHOS 67  BILITOT 0.5   Studies/Results: US Abdomen Limited RUQ (LIVER/GB)  Result Date: 03/13/2023 CLINICAL DATA:  Acute pancreatitis EXAM: ULTRASOUND ABDOMEN LIMITED RIGHT UPPER QUADRANT COMPARISON:  CT abdomen and pelvis 03/13/2023 FINDINGS: Gallbladder: No gallstones or wall thickening visualized. No sonographic Murphy sign noted by sonographer. Common bile duct: Diameter: 2.7 mm Liver: No focal lesion identified. There is increase in parenchymal echogenicity. Portal vein is patent on  color Doppler imaging with normal direction of blood flow towards the liver. Other: Pancreatic duct is dilated measuring 4 mm as seen on prior CT. IMPRESSION: 1. No cholelithiasis or sonographic evidence of acute cholecystitis. 2. Pancreatic duct dilation as seen on prior CT. 3. Fatty liver. Electronically Signed   By: Darliss Cheney M.D.   On: 03/13/2023 23:00       Assessment / Plan:   Assessment: 1.  Pancreatitis: Acute onset epigastric pain with nausea and vomiting, lipase 2780 with downtrend over hospitalization and CT with acute pancreatitis as well as pancreatic divisum and some other abnormality, normal LFTs, ultrasound unrevealing; possibly known pancreatic divisum could be contributing, otherwise unknown etiology 2.  A-fib: On Xarelto 3.  IBS mixed: Lately more diarrhea until the past 48 hours and she has not had a bowel movement at all, this has helped with Lasix from the hospital  Plan: 1.  Discussed with patient she should remain on a low-fat diet for the next week or so, then slowly increase back to her regular foods. 2.  We will arrange for MRI/MRCP with pancreatic protocol in 2 months and then follow-up in our clinic with Dr. Leone Payor thereafter.  Patient will call if she has any trouble between now and then. 3.  Okay patient discharged home.  Thank you for your  kind consultation.  Will sign off.   LOS:  1 day   Unk Lightning  03/15/2023, 12:33 PM  I have taken an interval history and thoroughly reviewed the chart.  I agree with the Advanced Practitioner's note, impression and recommendations, and have recorded additional findings, impressions and recommendations below. I performed a substantive portion of this encounter (>50% time spent), including a complete performance of the medical decision making.  My additional thoughts are as follows:  She is reportedly clinically improved and ready for discharge today.  We will coordinate follow-up with Dr. Leone Payor, and we have  kept him up-to-date on the events of this hospitalization thus far.    Charlie Pitter III Office:[phone]

## 2023-03-18 ENCOUNTER — Other Ambulatory Visit: Payer: Self-pay

## 2023-03-18 NOTE — Telephone Encounter (Signed)
Called and spoke with patient regarding recommendations below. I spent over 20 minutes on the phone with patient as she is very anxious about her pancreatitis diagnosis. Pt has several questions and concerns that she would like addressed before repeat MRI in October. Pt does not want to do anything that could make her condition worse prior to repeat MRI and she does not want to end up back in the hospital in terrible pain as she did before. Pt had questions regarding her diet. Pt has still been on clear liquids, pt was advised to slowly advance her diet as tolerated. We reviewed options that patient could eat for breakfast, lunch, dinner, and desert. Pt has been advised to continue a low-fat diet. Pt wanted to know if OK for her to continue Lomotil PRN for loose stools, she is aware that this is fine. Pt had questions regarding Metoprolol prescription, she was directed to follow up with her cardiologist regarding medication management. Pt would still like to further discuss how her diagnoses came about. Pt has been scheduled for a follow up appt with Hyacinth Meeker, PA-C on 03/28/23 at 3 pm. Pt knows to expect call from [facility] radiology scheduling to set up her MRI appt in October. Pt has been scheduled for a follow up with Dr. Leone Payor on Monday, 06/10/23 at 11:30 am to review MRI results and next steps. Pt was thankful for my time and recommendations, pt is aware that other concerns will be addressed at her upcoming office visit on 03/28/23. Pt verbalized understanding.  MRI order in epic. Secure staff message sent to radiology scheduling to contact patient to schedule MRI in October.

## 2023-03-22 NOTE — Telephone Encounter (Signed)
MRCP scheduled for 05/17/23 at 9 am

## 2023-03-26 LAB — FLECAINIDE LEVEL: Flecainide: 0.21 ug/mL (ref 0.20–1.00)

## 2023-03-28 ENCOUNTER — Ambulatory Visit: Payer: PPO | Admitting: Physician Assistant

## 2023-03-28 ENCOUNTER — Ambulatory Visit
Admission: RE | Admit: 2023-03-28 | Discharge: 2023-03-28 | Disposition: A | Discharge status: Home / Self Care | Payer: PPO | Source: Ambulatory Visit | Attending: Physician Assistant | Admitting: Physician Assistant

## 2023-03-28 ENCOUNTER — Encounter: Payer: Self-pay | Admitting: Physician Assistant

## 2023-03-28 VITALS — BP 110/60 | HR 47 | Ht 67.0 in | Wt 146.1 lb

## 2023-03-28 DIAGNOSIS — Z8719 Personal history of other diseases of the digestive system: Secondary | ICD-10-CM | POA: Diagnosis not present

## 2023-03-28 DIAGNOSIS — R1013 Epigastric pain: Secondary | ICD-10-CM | POA: Diagnosis not present

## 2023-03-28 DIAGNOSIS — K529 Noninfective gastroenteritis and colitis, unspecified: Secondary | ICD-10-CM

## 2023-03-28 DIAGNOSIS — Z1231 Encounter for screening mammogram for malignant neoplasm of breast: Secondary | ICD-10-CM | POA: Insufficient documentation

## 2023-03-28 MED ORDER — FAMOTIDINE 40 MG PO TABS: 40.0000 mg | ORAL_TABLET | Freq: Two times a day (BID) | ORAL | 11 refills | Status: AC

## 2023-03-28 MED ORDER — DIPHENOXYLATE-ATROPINE 2.5-0.025 MG PO TABS: 1.0000 | ORAL_TABLET | Freq: Four times a day (QID) | ORAL | 11 refills | Status: DC | PRN

## 2023-03-28 NOTE — Patient Instructions (Addendum)
_______________________________________________________  If your blood pressure at your visit was 140/90 or greater, please contact your primary care physician to follow up on this.  _______________________________________________________  If you are age 77 or older, your body mass index should be between 23-30. Your Body mass index is 22.89 kg/m. If this is out of the aforementioned range listed, please consider follow up with your Primary Care Provider.  If you are age 2 or younger, your body mass index should be between 19-25. Your Body mass index is 22.89 kg/m. If this is out of the aformentioned range listed, please consider follow up with your Primary Care Provider.   ________________________________________________________  The [place] [facility] providers would like to encourage you to use [facility] to communicate with providers for non-urgent requests or questions.  Due to long hold times on the telephone, sending your provider a message by [facility] may be a faster and more efficient way to get a response.  Please allow 48 business hours for a response.  Please remember that this is for non-urgent requests.  _______________________________________________________  We have sent the following medications to your pharmacy for you to pick up at your convenience: Lomotil-a script has been given Pepcid 2 times a day  Keep upcoming appointments  It was a pleasure to see you today!  Thank you for trusting me with your gastrointestinal care!

## 2023-03-28 NOTE — Progress Notes (Signed)
Chief Complaint: Follow up hospitalization for pancreatitis  HPI:    [name] is a 77 year old Caucasian female with a past medical history as listed below including A-fib on Xarelto, mixed IBS and recent episode of pancreatitis, known to Dr. Leone Payor, who presents to clinic today for follow-up of her pancreatitis.    03/13/2023-03/15/2023 patient consulted by our service in the hospital for pancreatitis.  At time of hospitalization lipase found to be 2780, LFTs normal, WBC normal, triglycerides 88, CTAP with pancreas divisum previously suspected on 2017 CT with progressive enlargement and heterogenicity of the pancreatic head since that time.  Follow-up MRI recommended.  Patient treated with aggressive IV fluids and analgesics.  Discussed she would need a follow-up MRI after resolution of acute pancreatitis to evaluate abnormality seen on CT.  Her diet was slowly increased and she eventually went home on 03/15/2023.  No etiology discovered other than possibly pancreatic divisum for cause of pancreatitis.  At time of discharge was post to have an MRI/MRCP in 2 months.    03/15/2023 patient called by our nursing staff and had a lot of concerns regarding her recent episode of pancreatitis and wanted a follow-up visit prior to her MRI which is scheduled in October, 05/17/2023, with follow-up with Dr. Leone Payor 06/10/2023 to review MRI results.    Today, the patient presents to clinic accompanied by her husband and just has some more questions in regards to her pancreatitis.  Apparently she has not had return of the severe pain and is still on a mostly bland diet trying to avoid high fat foods.  She does have questions about what she can eat or not eat. Again asked what she did to cause this.  She has read that Rosuvastatin may be contribute to pancreatitis and so she is going to talk about this with her other provider.  Aware that she has a follow-up MRI scheduled for abnormality seen at time of CT.  She has  questions in regards to this.      Currently her largest GI symptom is that she feels like she is having reflux and feels like something is in her throat.  She is not having issues swallowing like she used to and does not feel like she needs another EGD at this point, but would like to trial something to help with reflux.  She does have osteopenia.  Currently on Famotidine 20 mg daily.    Also requesting a Lomotil refill for her diarrhea.    Denies fever, chills, weight loss or blood in her stool.  GI history: 07/13/2021 EGD with no endoscopic esophageal abnormality, esophagus empirically dilated 05/19/2021 office visit with Dr. Leone Payor for esophageal dysphagia, SIBO and IBS-mixed; plan:-Repeat EGD with possible dilation for dysphagia, prescribed Metronidazole for SIBO 01/05/2021 colonoscopy with diverticulosis in the sigmoid colon, friable mucosa in the ascending colon and cecum; biopsies were okay, recommended VSL #3 and Lomotil  Past Medical History:  Diagnosis Date   Actinic keratosis 07/13/2020   R lat thigh - bx proven    Arthritis    "fingers; right shoulder"   Atypical mole 12/10/2012   left sacral (severe)   Blood transfusion without reported diagnosis    Broken ribs    C. difficile diarrhea    Cataract    Clostridium difficile colitis 04/01/2016   Collagenous colitis    Coronary Calcium    a. 11/2014 Cardiac CT: Ca2+ score = 50 - all in prox LAD. 69th %'ile for age/sex; b. 05/2015 Ex MV:  EF 73%. No infarct/ischemia-->Low risk.   Diverticulosis    Esophageal candidiasis (HCC)    GERD (gastroesophageal reflux disease)    Heart murmur    Hiatal hernia 2004   Hyperlipidemia    IBS (irritable bowel syndrome)    Internal hemorrhoids 2010   Colon-Dr. Mechele Collin    Kidney stones    Midsternal chest pain    a. 01/2011 Ex Mv: EF 68%, No ischemia.   Migraine 01/16/2012   "none now for several years"   Mild Mitral regurgitation    a. 12/2011 Echo: EF 55-60%, no rwma, No evidence of  MVP, Mild MR   Osteoporosis    PAF (paroxysmal atrial fibrillation) (HCC)    a. diagnosed 12/2011-->s/p DCCV-->flecainide/Xarelto (CHA2DS2VASc = 2).   Pancreas divisum of native pancreas    probable by CT   Pterygium eye, bilateral    Schatzki's ring 2010    EGD- Dr. Mechele Collin    Small intestinal bacterial overgrowth 06/25/2018   Squamous cell carcinoma of skin 03/31/2014   left mid pretibial, right mid pretibial sup, right mid pretibial inf   Squamous cell carcinoma of skin 05/19/2014   right ant pretibial inf   Squamous cell carcinoma of skin 09/05/2015   left dorsum wrist   Squamous cell carcinoma of skin 11/10/2015   left ant distal thigh   Squamous cell carcinoma of skin 11/26/2017   right prox calf   Squamous cell carcinoma of skin 07/09/2018   left prox lat pretibial   Squamous cell carcinoma of skin 12/02/2018   left pretibial   Squamous cell carcinoma of skin 05/25/2020   R lower leg anterior    Squamous cell carcinoma of skin 07/13/2020   R ant thigh    Squamous cell carcinoma of skin 11/28/2020   Left distal lat pretibial near ankle - EDC   Squamous cell carcinoma of skin 02/22/2021   L med knee - ED&C   Squamous cell carcinoma of skin 02/22/2021   L mid to distal pretibial - ED&C   Squamous cell carcinoma of skin 07/11/2021   Right medial pretibial - EDC   Squamous cell carcinoma of skin 11/15/2021   Right medial thigh - EDC   Squamous cell carcinoma of skin 11/23/2021   right medial ankle, EDC   Squamous cell carcinoma of skin 02/15/2022   R ant ankle - ED&C   Squamous cell carcinoma of skin 02/15/2022   right medial lower leg above ankle inferior - scheduled for ED&C   Squamous cell carcinoma of skin 02/15/2022   right medial lower leg above ankle superior - scheduled for ED&C   Squamous cell carcinoma of skin 07/26/2022   left lateral knee, EDC   Squamous cell carcinoma of skin 02/21/2023   Left anterior thigh. WD SCC with superficial infiltration. EDC.     Past Surgical History:  Procedure Laterality Date   APPENDECTOMY  1963   BREAST BIOPSY     BREAST BIOPSY Right 09/2020   pt had a bx done in [place] heart clip pt stated neg bx   BREAST CYST ASPIRATION Bilateral    "several; both sides"   CARDIOVERSION  01/16/2012   Procedure: CARDIOVERSION;  Surgeon: Marinus Maw, MD;  Location: [facility] OR;  Service: Cardiovascular;  Laterality: N/A;   COLONOSCOPY  Multiple   CYSTOSCOPY W/ STONE MANIPULATION  1980's   EYE SURGERY     x4   LEG SURGERY     skin cancer removal   OVARIAN CYST REMOVAL  1990's  SHOULDER ARTHROSCOPY W/ ROTATOR CUFF REPAIR Right ~ 2005   SQUAMOUS CELL CARCINOMA EXCISION     "10-12 removed; all over my body - nose, predominately legs"   TONSILLECTOMY AND ADENOIDECTOMY  1975   UPPER GASTROINTESTINAL ENDOSCOPY  Multiple   w/dilation, hiatal hernia   VAGINAL HYSTERECTOMY  1972    Current Outpatient Medications  Medication Sig Dispense Refill   ALPRAZolam (XANAX) 0.5 MG tablet Take 1 tablet by mouth at bedtime as needed.     Coenzyme Q10 (COQ10 PO) Take 1 tablet by mouth daily.     diphenoxylate-atropine (LOMOTIL) 2.5-0.025 MG tablet TAKE ONE TABLET BY MOUTH FOUR TIMES DAILY AS NEEDED FOR DIARRHEA OR LOOSE STOOLS. 60 tablet 3   escitalopram (LEXAPRO) 10 MG tablet Take 1 tablet (10 mg total) by mouth daily. 90 tablet 3   EYSUVIS 0.25 % SUSP Apply 1 drop to eye 3 (three) times daily.     famotidine (PEPCID) 20 MG tablet Take 20 mg by mouth daily.     flecainide (TAMBOCOR) 100 MG tablet TAKE 1 TABLET BY MOUTH TWICE A DAY 180 tablet 1   fluorouracil (EFUDEX) 5 % cream Apply to the left lower leg BID x 7 days. 15 g 0   ibandronate (BONIVA) 150 MG tablet Take 1 tablet (150 mg total) by mouth every 30 (thirty) days. 1 tablet 0   nitroGLYCERIN (NITROSTAT) 0.4 MG SL tablet Place 1 tablet (0.4 mg total) under the tongue every 5 (five) minutes as needed for chest pain. 25 tablet 3   rivaroxaban (XARELTO) 20 MG TABS tablet TAKE 1 TABLET  BY MOUTH ONCE A DAY WITH SUPPER 90 tablet 1   rosuvastatin (CRESTOR) 10 MG tablet Take 1 tablet (10 mg total) by mouth daily. 90 tablet 2   No current facility-administered medications for this visit.    Allergies as of 03/28/2023 - Review Complete 03/13/2023  Allergen Reaction Noted   Latex  03/06/2012   Adhesive [tape] Rash 03/06/2012   Aspirin Other (See Comments)    Nicotinamide [niacinamide] Rash 11/15/2021   Prednisone Palpitations 06/08/2014    Family History  Problem Relation Age of Onset   Alzheimer's disease Mother        Died in [place] 40's.   Heart disease Father        A Fib and CHF - died in Mid 49   Diabetes Father    Kidney disease Father    Irritable bowel syndrome Father    Stomach cancer Neg Hx    Pancreatic cancer Neg Hx    Esophageal cancer Neg Hx    Colon cancer Neg Hx    Rectal cancer Neg Hx     Social History   Socioeconomic History   Marital status: Married    Spouse name: Not on file   Number of children: 1   Years of education: Not on file   Highest education level: Not on file  Occupational History   Occupation: retired  Tobacco Use   Smoking status: Former    Current packs/day: 0.00    Average packs/day: 0.1 packs/day for 40.0 years (4.0 ttl pk-yrs)    Types: Cigarettes    Start date: 01/16/1972    Quit date: 01/16/2012    Years since quitting: 11.2   Smokeless tobacco: Never  Vaping Use   Vaping status: Never Used  Substance and Sexual Activity   Alcohol use: Not Currently    Alcohol/week: 0.0 standard drinks of alcohol   Drug use: No  Sexual activity: Not on file  Other Topics Concern   Not on file  Social History Narrative   Lives in [place] with husband.   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, who got to sit on the bench with coach K for his 80th birthday   Social Determinants of Health   Financial Resource Strain: Not  on file  Food Insecurity: No Food Insecurity (03/13/2023)   Hunger Vital Sign    Worried About Running Out of Food in the Last Year: Never true    Ran Out of Food in the Last Year: Never true  Transportation Needs: No Transportation Needs (03/13/2023)   PRAPARE - Administrator, Civil Service (Medical): No    Lack of Transportation (Non-Medical): No  Physical Activity: Not on file  Stress: Not on file  Social Connections: Not on file  Intimate Partner Violence: Not At Risk (03/13/2023)   Humiliation, Afraid, Rape, and Kick questionnaire    Fear of Current or Ex-Partner: No    Emotionally Abused: No    Physically Abused: No    Sexually Abused: No    Review of Systems:    Constitutional: No weight loss, fever or chills Cardiovascular: No chest pain Respiratory: No SOB Gastrointestinal: See HPI and otherwise negative   Physical Exam:  Vital signs: BP 110/60   Pulse (!) 47   Ht 5\' 7"  (1.702 m)   Wt 146 lb 2 oz (66.3 kg)   BMI 22.89 kg/m    Constitutional:   Pleasant Elderly Caucasian female appears to be in NAD, Well developed, Well nourished, alert and cooperative Respiratory: Respirations even and unlabored. Lungs clear to auscultation bilaterally.   No wheezes, crackles, or rhonchi.  Cardiovascular: Normal S1, S2. No MRG. Regular rate and rhythm. No peripheral edema, cyanosis or pallor.  Gastrointestinal:  Soft, nondistended, moderate epigastric ttp, No rebound or guarding. Normal bowel sounds. No appreciable masses or hepatomegaly. Rectal:  Not performed.  Psychiatric:  Demonstrates good judgement and reason without abnormal affect or behaviors.  RELEVANT LABS AND IMAGING: CBC    Component Value Date/Time   WBC 6.9 03/15/2023 0748   RBC 3.68 (L) 03/15/2023 0748   HGB 10.8 (L) 03/15/2023 0748   HGB 12.5 03/31/2021 1015   HCT 33.3 (L) 03/15/2023 0748   HCT 38.8 03/31/2021 1015   PLT 183 03/15/2023 0748   PLT 219 03/31/2021 1015   MCV 90.5 03/15/2023 0748    MCV 91 03/31/2021 1015   MCH 29.3 03/15/2023 0748   MCHC 32.4 03/15/2023 0748   RDW 13.4 03/15/2023 0748   RDW 13.2 03/31/2021 1015   LYMPHSABS 2.3 07/08/2019 1448   MONOABS 0.7 07/08/2019 1448   EOSABS 0.1 07/08/2019 1448   BASOSABS 0.1 07/08/2019 1448    CMP     Component Value Date/Time   NA 137 03/15/2023 0748   NA 140 03/31/2021 1015   K 3.6 03/15/2023 0748   CL 107 03/15/2023 0748   CO2 21 (L) 03/15/2023 0748   GLUCOSE 83 03/15/2023 0748   BUN 9 03/15/2023 0748   BUN 22 03/31/2021 1015   CREATININE 0.83 03/15/2023 0748   CALCIUM 8.2 (L) 03/15/2023 0748   PROT 6.6 03/13/2023 0400   PROT 6.1 12/22/2014 1008   ALBUMIN 3.6 03/13/2023 0400   ALBUMIN 4.1 12/22/2014 1008   AST 19  03/13/2023 0400   ALT 15 03/13/2023 0400   ALKPHOS 67 03/13/2023 0400   BILITOT 0.5 03/13/2023 0400   BILITOT 0.5 12/22/2014 1008   GFRNONAA >60 03/15/2023 0748   GFRAA >60 05/19/2018 0936    Assessment: 1.  History of pancreatitis: Recent hospitalization, please see HPI, unknown etiology, patient does have pancreatic divisum which could have contributed 2.  Abnormal CT of the pancreas: With some abnormality behind inflammation and recommended MRI follow-up, this is already scheduled 3.  IBS-mixed: Currently battling diarrhea and would like a refill of Lomotil 4.  Epigastric pain with history of GERD: Patient tells me he feels exactly similar to her normal reflux symptoms, just agitated at the moment, extensive workup in the past  Plan: 1.  At this point we will increase Famotidine to 40 mg twice daily, every morning and nightly.  Sent #60 with 5 refills. 2.  Reviewed all of patient's hospitalization and answered all of her many questions.  Tried to reassure her. 3.  MRI is already scheduled for October and she has follow-up scheduled Dr. Leone Payor after to discuss results. 4.  Patient wanted discussed the possibility of repeat EGD.  I think we will trial the Famotidine for now and see how things  go, if no change then could repeat EGD last done in 2022 with empiric dilation. 5.  Refilled Lomotil. 6.  Discussed diet in detail.  Mostly recommend low-fat.  Hyacinth Meeker, PA-C [facility] 03/28/2023, 2:54 PM  Cc: Patrice Paradise, MD

## 2023-03-29 ENCOUNTER — Telehealth: Payer: Self-pay | Admitting: Cardiovascular Disease

## 2023-03-29 ENCOUNTER — Other Ambulatory Visit: Payer: Self-pay | Admitting: Cardiovascular Disease

## 2023-03-29 DIAGNOSIS — I480 Paroxysmal atrial fibrillation: Secondary | ICD-10-CM

## 2023-03-29 NOTE — Telephone Encounter (Signed)
Refill Request.  

## 2023-03-29 NOTE — Telephone Encounter (Signed)
Refill request

## 2023-03-29 NOTE — Telephone Encounter (Signed)
*  STAT* If patient is at the pharmacy, call can be transferred to refill team.   1. Which medications need to be refilled? (please list name of each medication and dose if known)   rivaroxaban (XARELTO) 20 MG TABS tablet   2. Would you like to learn more about the convenience, safety, & potential cost savings by using the [facility]?   3. Are you open to using the [facility] (Type [facility]. ).  4. Which pharmacy/location (including street and city if local pharmacy) is medication to be sent to?  [facility] - [place], [place] - [address]   5. Do they need a 30 day or 90 day supply?   90 day  Patient stated she is almost out of this medication.

## 2023-03-30 ENCOUNTER — Other Ambulatory Visit: Payer: Self-pay | Admitting: Cardiovascular Disease

## 2023-03-30 DIAGNOSIS — I480 Paroxysmal atrial fibrillation: Secondary | ICD-10-CM

## 2023-04-01 ENCOUNTER — Telehealth: Payer: Self-pay | Admitting: Cardiovascular Disease

## 2023-04-01 NOTE — Telephone Encounter (Signed)
Left voice mail, pt needs 12 mo appt scheduled from recall.

## 2023-04-01 NOTE — Telephone Encounter (Signed)
Left voice mail to schedule appt

## 2023-04-01 NOTE — Telephone Encounter (Signed)
Prescription refill request for Xarelto received.  Indication:afib Last office visit:needs cardiology appt Weight:66.3  kg Age:77 Scr:0.83  8/24 CrCl:59.41  ml/min  Prescription refilled

## 2023-04-15 NOTE — Progress Notes (Signed)
Cardiology Office Note    Date:  04/18/2023   ID:  [name], [name] June 13, 1946, MRN [id number]  PCP:  Patrice Paradise, MD  Cardiologist:  Julien Nordmann, MD  Electrophysiologist:  None   Chief Complaint:  Chief Complaint  Patient presents with   12 month follow up     "Doing well." Medications reviewed by the patient verbally.     History of Present Illness:   [name] is a 77 year old woman with a history of  palpitations and tachycardia,  paroxysmal atrial fibrillation ,cardioversion. 2013 smoking history, stopped in 2012 CT coronary calcium score showing Mid LAD coronary calcification, score of 50 in 2016 who presents for routine followup of her atrial fibrillation.  Last seen by myself in clinic July 2023  In the hospital August 2024 for severe abdominal pain, nausea vomiting acute pancreatitis, GERD Pancreatic mass noted Bradycardia and hypotension early in admission, metoprolol held at discharge Previous on metoprolol succinate 37.5 daily Was continued on flecainide and Xarelto Outpatient MRI/MRCP scheduled October 25  On further discussion, she reports that the abdominal pain is much improved, she is eating better She continued on metoprolol succinate 25 daily Blood pressure low today, denies orthostasis symptoms Attributes lower blood pressure to recent weight loss  For the past 6 years has traveled to [place] in the winter,  Reports that she is going to [place] dec 2024 to April  Denies significant chest pain concerning for angina No shortness of breath on exertion  Denies significant atrial fibrillation spells On flecainide 100 BID and metoprolol succinate 25 daily   Other past medical history reviewed Echocardiogram 04/2019 with LVEF 55 to 60%, RV normal size and function, bilateral atrium normal size, mild mitral valve prolapse, mild to moderate MR.   Echo: 05/12/19  1. Left ventricular ejection fraction, by visual estimation, is 55 to 60%.  The left ventricle has normal function. Normal left ventricular size. There is no left ventricular hypertrophy.  2. Global right ventricle has normal systolic function.The right ventricular size is normal. No increase in right ventricular wall thickness.  3. Left atrial size was normal.  4. Right atrial size was normal.  5. Mild mitral valve prolapse.  6. The mitral valve is myxomatous. Mild to moderate mitral valve regurgitation.   Monitor 05/06/2019 results reviewed with her avg HR of 62 bpm.  ---1 run of Ventricular Tachycardia occurred lasting 7 beats with a max rate of 158 bpm (avg 136 bpm).  ---95 Supraventricular Tachycardia runs occurred, the run with the fastest interval lasting 7 beats with a max rate of 214 bpm, the longest lasting 13 beats with an avg rate of 115 bpm.    Previous hospital evaluation at [facility] about 3 weeks ago with chest pain that radiated through to her back Records have been requested Was at [employer], developed chest pain on the left Very painful, lasted couple hours, 10/10 Through to back Hurt to take a breath in Received IV meds (morphine?)  enz negative per the patient scan looked ok  (CT? Or V/Q ?). Was told she had thickening of her esophagus   02/2016  CT ABD:  Aortic atherosclerosis.   Previous CT coronary calcium score of 50, most of her disease in the mid LAD Mild aorta atherosclerosis   Previously reported having diverticulitis, ulcerative colitis.    Labs independently reviewed: 02/2023 - potassium 3.6, BUN 9, serum creatinine 0.83, Hgb 10.8, PLT 183, TC 132, TG 88, HDL 58, LDL 56, albumin  3.6, AST/ALT normal 05/2022 - A1c 5.6, magnesium 1.9 05/2021 - TSH normal  Past Medical History:  Diagnosis Date   Actinic keratosis 07/13/2020   R lat thigh - bx proven    Arthritis    "fingers; right shoulder"   Atypical mole 12/10/2012   left sacral (severe)   Blood transfusion without reported diagnosis    Broken ribs    C.  difficile diarrhea    Cataract    Clostridium difficile colitis 04/01/2016   Collagenous colitis    Coronary Calcium    a. 11/2014 Cardiac CT: Ca2+ score = 50 - all in prox LAD. 69th %'ile for age/sex; b. 05/2015 Ex MV: EF 73%. No infarct/ischemia-->Low risk.   Diverticulosis    Esophageal candidiasis (HCC)    GERD (gastroesophageal reflux disease)    Heart murmur    Hiatal hernia 2004   Hyperlipidemia    IBS (irritable bowel syndrome)    Internal hemorrhoids 2010   Colon-Dr. Mechele Collin    Kidney stones    Midsternal chest pain    a. 01/2011 Ex Mv: EF 68%, No ischemia.   Migraine 01/16/2012   "none now for several years"   Mild Mitral regurgitation    a. 12/2011 Echo: EF 55-60%, no rwma, No evidence of MVP, Mild MR   Osteoporosis    PAF (paroxysmal atrial fibrillation) (HCC)    a. diagnosed 12/2011-->s/p DCCV-->flecainide/Xarelto (CHA2DS2VASc = 2).   Pancreas divisum of native pancreas    probable by CT   Pterygium eye, bilateral    Schatzki's ring 2010    EGD- Dr. Mechele Collin    Small intestinal bacterial overgrowth 06/25/2018   Squamous cell carcinoma of skin 03/31/2014   left mid pretibial, right mid pretibial sup, right mid pretibial inf   Squamous cell carcinoma of skin 05/19/2014   right ant pretibial inf   Squamous cell carcinoma of skin 09/05/2015   left dorsum wrist   Squamous cell carcinoma of skin 11/10/2015   left ant distal thigh   Squamous cell carcinoma of skin 11/26/2017   right prox calf   Squamous cell carcinoma of skin 07/09/2018   left prox lat pretibial   Squamous cell carcinoma of skin 12/02/2018   left pretibial   Squamous cell carcinoma of skin 05/25/2020   R lower leg anterior    Squamous cell carcinoma of skin 07/13/2020   R ant thigh    Squamous cell carcinoma of skin 11/28/2020   Left distal lat pretibial near ankle - EDC   Squamous cell carcinoma of skin 02/22/2021   L med knee - ED&C   Squamous cell carcinoma of skin 02/22/2021   L mid to  distal pretibial - ED&C   Squamous cell carcinoma of skin 07/11/2021   Right medial pretibial - EDC   Squamous cell carcinoma of skin 11/15/2021   Right medial thigh - EDC   Squamous cell carcinoma of skin 11/23/2021   right medial ankle, EDC   Squamous cell carcinoma of skin 02/15/2022   R ant ankle - ED&C   Squamous cell carcinoma of skin 02/15/2022   right medial lower leg above ankle inferior - scheduled for ED&C   Squamous cell carcinoma of skin 02/15/2022   right medial lower leg above ankle superior - scheduled for ED&C   Squamous cell carcinoma of skin 07/26/2022   left lateral knee, EDC   Squamous cell carcinoma of skin 02/21/2023   Left anterior thigh. WD SCC with superficial infiltration. EDC.    Past  Surgical History:  Procedure Laterality Date   APPENDECTOMY  1963   BREAST BIOPSY     BREAST BIOPSY Right 09/2020   pt had a bx done in [place] heart clip pt stated neg bx   BREAST CYST ASPIRATION Bilateral    "several; both sides"   CARDIOVERSION  01/16/2012   Procedure: CARDIOVERSION;  Surgeon: Marinus Maw, MD;  Location: [facility] OR;  Service: Cardiovascular;  Laterality: N/A;   COLONOSCOPY  Multiple   CYSTOSCOPY W/ STONE MANIPULATION  1980's   EYE SURGERY     x4   LEG SURGERY     skin cancer removal   OVARIAN CYST REMOVAL  1990's   SHOULDER ARTHROSCOPY W/ ROTATOR CUFF REPAIR Right ~ 2005   SQUAMOUS CELL CARCINOMA EXCISION     "10-12 removed; all over my body - nose, predominately legs"   TONSILLECTOMY AND ADENOIDECTOMY  1975   UPPER GASTROINTESTINAL ENDOSCOPY  Multiple   w/dilation, hiatal hernia   VAGINAL HYSTERECTOMY  1972    Current Medications: Current Meds  Medication Sig   ALPRAZolam (XANAX) 0.5 MG tablet Take 1 tablet by mouth at bedtime as needed.   Coenzyme Q10 (COQ10 PO) Take 1 tablet by mouth daily.   diphenoxylate-atropine (LOMOTIL) 2.5-0.025 MG tablet Take 1 tablet by mouth 4 (four) times daily as needed for diarrhea or loose stools.   escitalopram  (LEXAPRO) 10 MG tablet Take 1 tablet (10 mg total) by mouth daily.   EYSUVIS 0.25 % SUSP Apply 1 drop to eye 3 (three) times daily.   famotidine (PEPCID) 40 MG tablet Take 1 tablet (40 mg total) by mouth 2 (two) times daily.   flecainide (TAMBOCOR) 100 MG tablet TAKE 1 TABLET BY MOUTH TWICE A DAY   fluorouracil (EFUDEX) 5 % cream Apply to the left lower leg BID x 7 days.   ibandronate (BONIVA) 150 MG tablet Take 1 tablet (150 mg total) by mouth every 30 (thirty) days.   nitroGLYCERIN (NITROSTAT) 0.4 MG SL tablet Place 1 tablet (0.4 mg total) under the tongue every 5 (five) minutes as needed for chest pain.   rivaroxaban (XARELTO) 20 MG TABS tablet Take 1 tablet (20 mg total) by mouth daily with supper. NEEDS CARDIOLOGY APPT FOR REFILLS, CALL OFFICE   rosuvastatin (CRESTOR) 10 MG tablet Take 1 tablet (10 mg total) by mouth daily.    Allergies:   Latex, Adhesive [tape], Aspirin, Nicotinamide [niacinamide], and Prednisone   Social History   Socioeconomic History   Marital status: Married    Spouse name: Not on file   Number of children: 1   Years of education: Not on file   Highest education level: Not on file  Occupational History   Occupation: retired  Tobacco Use   Smoking status: Former    Current packs/day: 0.00    Average packs/day: 0.1 packs/day for 40.0 years (4.0 ttl pk-yrs)    Types: Cigarettes    Start date: 01/16/1972    Quit date: 01/16/2012    Years since quitting: 11.2   Smokeless tobacco: Never  Vaping Use   Vaping status: Never Used  Substance and Sexual Activity   Alcohol use: Not Currently    Alcohol/week: 0.0 standard drinks of alcohol   Drug use: No   Sexual activity: Not on file  Other Topics Concern   Not on file  Social History Narrative   Lives in [place] with husband.   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, who got to sit on the bench with coach K  for his 80th birthday   Social Determinants of Health   Financial Resource Strain: Not on file  Food Insecurity: No Food Insecurity (03/13/2023)   Hunger Vital Sign    Worried About Running Out of Food in the Last Year: Never true    Ran Out of Food in the Last Year: Never true  Transportation Needs: No Transportation Needs (03/13/2023)   PRAPARE - Administrator, Civil Service (Medical): No    Lack of Transportation (Non-Medical): No  Physical Activity: Not on file  Stress: Not on file  Social Connections: Not on file     Family History:  The patient's family history includes Alzheimer's disease in her mother; Diabetes in her father; Heart disease in her father; Irritable bowel syndrome in her father; Kidney disease in her father. There is no history of Stomach cancer, Pancreatic cancer, Esophageal cancer, Colon cancer, or Rectal cancer.  ROS:   12-point review of systems is negative unless otherwise noted in the HPI.   EKGs/Labs/Other Studies Reviewed:    Studies reviewed were summarized above. The additional studies were reviewed today:  Coronary CTA 02/19/2022: FINDINGS: Aorta: Normal size. Ascending and descending aorta calcifications. No dissection.   Aortic Valve:  Trileaflet.  No calcifications.   Coronary Arteries:  Normal coronary origin.  Right dominance.   RCA is a dominant artery that gives rise to PDA and PLA. There is no plaque.   Left main gives rise to LAD and LCX arteries.  LM has no disease.   LAD has calcified plaque proximally causing mild stenosis (25-49%). There is mild stenosis (25-49%) in the proximal first diagonal branch.   LCX is a non-dominant artery that gives rise to two obtuse marginal branches. There is no plaque.   Other findings:   Normal pulmonary vein drainage into the left atrium.   Normal left atrial appendage without a thrombus.   Normal size of the pulmonary artery.   IMPRESSION: 1. Coronary calcium score of  340. This was 79th percentile for age and sex matched control. 2. Normal coronary origin with right dominance. 3. Calcified plaque causing mild stenosis (25-49%), in the proximal LAD and first diagonal branch. 4. CAD-RADS 2. Mild non-obstructive CAD (25-49%). Consider non-atherosclerotic causes of chest pain. Consider preventive therapy and risk factor modification. __________  2D echo 05/12/2019: 1. Left ventricular ejection fraction, by visual estimation, is 55 to  60%. The left ventricle has normal function. Normal left ventricular size.  There is no left ventricular hypertrophy.   2. Global right ventricle has normal systolic function.The right  ventricular size is normal. No increase in right ventricular wall  thickness.   3. Left atrial size was normal.   4. Right atrial size was normal.   5. Mild mitral valve prolapse.   6. The mitral valve is myxomatous. Mild to moderate mitral valve  regurgitation.   7. Severity of mitral regurgitation may be underestimated due to multiple  jets/eccentricity.   8. The tricuspid valve is grossly normal. Tricuspid valve regurgitation  is mild.   9. The aortic valve is tricuspid Aortic valve regurgitation is trivial by  color flow Doppler. Structurally normal aortic valve, with no evidence of  sclerosis or stenosis.  10. The pulmonic valve was not well visualized. Pulmonic valve  regurgitation is mild by color flow Doppler.  11. Aortic dilatation noted.  12. There is mild dilatation of the ascending aorta measuring 36 mm.  13. TR signal is inadequate for assessing pulmonary artery systolic  pressure.  14. The inferior vena cava is normal in size with greater than 50%  respiratory variability, suggesting right atrial pressure of 3 mmHg.  15. The interatrial septum was not well visualized.  __________  Luci Bank patch 03/2019: Normal sinus rhythm avg HR of 62 bpm.    1 run of Ventricular Tachycardia occurred lasting 7 beats with a max rate of  158 bpm (avg 136 bpm).    95 Supraventricular Tachycardia runs occurred, the run with the fastest interval lasting 7 beats with a max rate of 214 bpm, the longest lasting 13 beats with an avg rate of 115 bpm.    Isolated SVEs  were rare (<1.0%), SVE Couplets were rare (<1.0%), and SVE Triplets were rare (<1.0%). Isolated VEs were rare (<1.0%), VE Couplets were rare (<1.0%), and no VE Triplets were present. Ventricular Bigeminy and Trigeminy were present.   No patient triggered events __________  Treadmill MPI 06/21/2015: Exercise myocardial perfusion imaging study with no significant  ischemia Normal wall motion, EF estimated at 73% ST depressions concerning for ischemia at peak stress.  Target heart rate achieved, adequate exercise tolerance Low risk scan  Given EKG changes at peak stress, close clinical observation suggested with aggressive risk factor modification __________  Calcium score 12/10/2014: Ascending Aorta:  Measuring 38 x 39 mm.   Pericardium: Normal   Coronary arteries:  Normal origin.   IMPRESSION: Coronary calcium score of 50, all in proximal to mid LAD. This was 47 percentile for age and sex matched control. __________  2D echo 01/17/2012: - Left ventricle: The cavity size was normal. Wall thickness    was normal. Systolic function was normal. The estimated    ejection fraction was in the range of 55% to 60%. Wall    motion was normal; there were no regional wall motion    abnormalities.  - Mitral valve: Mild regurgitation.     Recent Labs: 03/13/2023: ALT 15 03/15/2023: BUN 9; Creatinine, Ser 0.83; Hemoglobin 10.8; Platelets 183; Potassium 3.6; Sodium 137  Recent Lipid Panel    Component Value Date/Time   CHOL 132 03/13/2023 1003   CHOL 185 12/22/2014 1008   TRIG 88 03/13/2023 1003   HDL 58 03/13/2023 1003   HDL 72 12/22/2014 1008   CHOLHDL 2.3 03/13/2023 1003   VLDL 18 03/13/2023 1003   LDLCALC 56 03/13/2023 1003   LDLCALC 100 (H) 12/22/2014  1008    PHYSICAL EXAM:    VS:  BP 90/60 (BP Location: Left Arm, Patient Position: Sitting, Cuff Size: Normal)   Pulse (!) 56   Ht 5' 7.5" (1.715 m)   Wt 143 lb (64.9 kg)   SpO2 97%   BMI 22.07 kg/m   BMI: Body mass index is 22.07 kg/m. Constitutional:  oriented to person, place, and time. No distress.  HENT:  Head: Grossly normal Eyes:  no discharge. No scleral icterus.  Neck: No JVD, no carotid bruits  Cardiovascular: Regular rate and rhythm, no murmurs appreciated Pulmonary/Chest: Clear to auscultation bilaterally, no wheezes or rails Abdominal: Soft.  no distension.  no tenderness.  Musculoskeletal: Normal range of motion Neurological:  normal muscle tone. Coordination normal. No atrophy Skin: Skin warm and dry Psychiatric: normal affect, pleasant  Wt Readings from Last 3 Encounters:  04/18/23 143 lb (64.9 kg)  03/28/23 146 lb 2 oz (66.3 kg)  03/13/23 158 lb 11.7 oz (72 kg)     ASSESSMENT & PLAN:   Atrial fibrillation with RVR Reports rhythm relatively well-controlled on flecainide 100 twice daily metoprolol succinate 25 daily Continues on Xarelto 20 daily Denies Little breakthrough tachyarrhythmia  Coronary calcification Smoking cessation recommended Cholesterol at goal Prior A1c well-controlled Denies anginal symptoms  Pancreatitis/pancreatic mass Recent hospital admission details reviewed, eating more, marked improvement in her abdominal pain Has additional scanning in 1 month   Total encounter time more than 30 minutes  Greater than 50% was spent in counseling and coordination of care with the patient   Medication Adjustments/Labs and Tests Ordered: Current medicines are reviewed at length with the patient today.  Concerns regarding medicines are outlined above. Medication changes, Labs and Tests ordered today are summarized above and listed in the Patient Instructions accessible in Encounters.   Signed, Dossie Arbour, MD, Ph.D [facility] [address] [place], [place] [address] [phone]

## 2023-04-17 DIAGNOSIS — K8500 Idiopathic acute pancreatitis without necrosis or infection: Secondary | ICD-10-CM | POA: Diagnosis not present

## 2023-04-18 ENCOUNTER — Telehealth: Payer: Self-pay | Admitting: *Deleted

## 2023-04-18 ENCOUNTER — Encounter: Payer: Self-pay | Admitting: Cardiovascular Disease

## 2023-04-18 ENCOUNTER — Ambulatory Visit: Payer: PPO | Attending: Physician Assistant | Admitting: Cardiovascular Disease

## 2023-04-18 DIAGNOSIS — I480 Paroxysmal atrial fibrillation: Secondary | ICD-10-CM | POA: Diagnosis not present

## 2023-04-18 MED ORDER — METOPROLOL SUCCINATE ER 25 MG PO TB24: 12.5000 mg | ORAL_TABLET | Freq: Every day | ORAL | 3 refills | Status: DC

## 2023-04-18 MED ORDER — ROSUVASTATIN CALCIUM 10 MG PO TABS: 10.0000 mg | ORAL_TABLET | Freq: Every day | ORAL | 3 refills | Status: DC

## 2023-04-18 MED ORDER — FLECAINIDE ACETATE 100 MG PO TABS: 100.0000 mg | ORAL_TABLET | Freq: Two times a day (BID) | ORAL | 3 refills | Status: DC

## 2023-04-18 MED ORDER — RIVAROXABAN 20 MG PO TABS: 20.0000 mg | ORAL_TABLET | Freq: Every day | ORAL | 3 refills | Status: DC

## 2023-04-18 NOTE — Telephone Encounter (Signed)
Application completed, faxed, and filed.

## 2023-04-18 NOTE — Telephone Encounter (Signed)
Patient in today to see provider. Request for assistance so application completed and once provider signs then I will get that sent to company.

## 2023-04-18 NOTE — Patient Instructions (Signed)
Medication Instructions:  Metoprolol succinate 12.5 daily  If you need a refill on your cardiac medications before your next appointment, please call your pharmacy.   Lab work: No new labs needed  Testing/Procedures: No new testing needed  Follow-Up: At [facility], you and your health needs are our priority.  As part of our continuing mission to provide you with exceptional heart care, we have created designated Provider Care Teams.  These Care Teams include your primary Cardiologist (physician) and Advanced Practice Providers (APPs -  Physician Assistants and Nurse Practitioners) who all work together to provide you with the care you need, when you need it.  You will need a follow up appointment in 12 months  Providers on your designated Care Team:   Nicolasa Ducking, NP Eula Listen, PA-C Cadence Fransico Michael, [place]  COVID-19 Vaccine Information can be found at: [email or web address] For questions related to vaccine distribution or appointments, please email [email or web address] or call [phone].

## 2023-04-28 ENCOUNTER — Encounter: Payer: Self-pay | Admitting: Internal Medicine

## 2023-04-28 DIAGNOSIS — M25511 Pain in right shoulder: Secondary | ICD-10-CM | POA: Diagnosis not present

## 2023-04-28 DIAGNOSIS — M542 Cervicalgia: Secondary | ICD-10-CM | POA: Diagnosis not present

## 2023-04-28 DIAGNOSIS — M47812 Spondylosis without myelopathy or radiculopathy, cervical region: Secondary | ICD-10-CM | POA: Diagnosis not present

## 2023-04-28 DIAGNOSIS — M13811 Other specified arthritis, right shoulder: Secondary | ICD-10-CM | POA: Diagnosis not present

## 2023-04-30 ENCOUNTER — Other Ambulatory Visit: Payer: Self-pay | Admitting: Emergency Medicine

## 2023-04-30 MED ORDER — RIVAROXABAN 20 MG PO TABS: 20.0000 mg | ORAL_TABLET | Freq: Every day | ORAL | Status: DC

## 2023-04-30 MED ORDER — RIVAROXABAN 20 MG PO TABS: 20.0000 mg | ORAL_TABLET | Freq: Every day | ORAL | 0 refills | Status: DC

## 2023-05-17 ENCOUNTER — Ambulatory Visit (HOSPITAL_COMMUNITY)
Admission: RE | Admit: 2023-05-17 | Discharge: 2023-05-17 | Disposition: A | Discharge status: Home / Self Care | Payer: PPO | Source: Ambulatory Visit | Attending: Internal Medicine | Admitting: Internal Medicine

## 2023-05-17 ENCOUNTER — Other Ambulatory Visit (HOSPITAL_COMMUNITY): Payer: Self-pay | Admitting: Internal Medicine

## 2023-05-17 DIAGNOSIS — K8689 Other specified diseases of pancreas: Secondary | ICD-10-CM | POA: Insufficient documentation

## 2023-05-17 DIAGNOSIS — Z8719 Personal history of other diseases of the digestive system: Secondary | ICD-10-CM | POA: Insufficient documentation

## 2023-05-17 DIAGNOSIS — R52 Pain, unspecified: Secondary | ICD-10-CM

## 2023-05-17 DIAGNOSIS — I700 Atherosclerosis of aorta: Secondary | ICD-10-CM | POA: Diagnosis not present

## 2023-05-17 DIAGNOSIS — K859 Acute pancreatitis without necrosis or infection, unspecified: Secondary | ICD-10-CM | POA: Insufficient documentation

## 2023-05-17 DIAGNOSIS — R1084 Generalized abdominal pain: Secondary | ICD-10-CM | POA: Diagnosis not present

## 2023-05-17 MED ORDER — GADOBUTROL 1 MMOL/ML IV SOLN
6.0000 mL | Freq: Once | INTRAVENOUS | Status: AC | PRN
  Administered 2023-05-17: 6 mL via INTRAVENOUS

## 2023-05-27 DIAGNOSIS — Z09 Encounter for follow-up examination after completed treatment for conditions other than malignant neoplasm: Secondary | ICD-10-CM | POA: Diagnosis not present

## 2023-05-27 DIAGNOSIS — Z961 Presence of intraocular lens: Secondary | ICD-10-CM | POA: Diagnosis not present

## 2023-06-10 ENCOUNTER — Ambulatory Visit: Payer: PPO | Admitting: Internal Medicine

## 2023-06-10 VITALS — BP 106/70 | HR 55 | Ht 67.0 in | Wt 142.0 lb

## 2023-06-10 DIAGNOSIS — K582 Mixed irritable bowel syndrome: Secondary | ICD-10-CM

## 2023-06-10 DIAGNOSIS — Q453 Other congenital malformations of pancreas and pancreatic duct: Secondary | ICD-10-CM

## 2023-06-10 DIAGNOSIS — K8689 Other specified diseases of pancreas: Secondary | ICD-10-CM

## 2023-06-10 DIAGNOSIS — Z8719 Personal history of other diseases of the digestive system: Secondary | ICD-10-CM

## 2023-06-10 DIAGNOSIS — K638219 Small intestinal bacterial overgrowth, unspecified: Secondary | ICD-10-CM

## 2023-06-10 NOTE — Patient Instructions (Signed)
We have placed an order for pelvic floor PT. They will contact you about setting this up.   Try over the counter Milk of Magnesium every other day with hot coffee. If that doesn't work try to take it daily.    We will contact you in the spring to set up your MRI.   I appreciate the opportunity to care for you. Stan Head, MD, [facility]

## 2023-06-10 NOTE — Progress Notes (Signed)
[name] 77 y.o. [birth year] [id number]  Assessment & Plan:   Encounter Diagnoses  Name Primary?   Dilation of pancreatic duct Yes   History of pancreatitis    Pancreas divisum, native    Irritable bowel syndrome with both constipation and diarrhea    Small intestinal bacterial overgrowth (SIBO)        Pancreatitis Resolved episode of pancreatitis I think most likely secondary to pancreas divisum, a congenital abnormality. MRI showed slight dilation of one of the pancreatic ducts but no evidence of a tumor. Lipase levels have returned to normal. -Repeat MRI in six months to monitor pancreatic duct dilation. -Continue low-fat diet as a precautionary measure due to history of pancreatitis.   Irritable Bowel Syndrome (Mixed type) Reports of constipation alternating with diarrhea, with urgency and incontinence during diarrhea episodes. Previous testing for SIBO was positive, and collagenous colitis has been ruled out. -Refer to pelvic floor physical therapy to help control bowel movements and reduce incontinence. -Advise to take a tablespoon of milk of magnesia and a hot cup of coffee every other day to promote regular bowel movements. Adjust frequency as needed based on bowel movement regularity.    Fecal fat and pancreatic elastase have been normal in 2022   Subjective:   Chief Complaint: Follow-up of pancreatitis and IBS issues  HPI 77 year old white woman with a long history of IBS with SIBO who had pancreatitis and was hospitalized earlier this year and is for a follow-up today.  Discussed the use of AI scribe software for clinical note transcription with the patient, who gave verbal consent to proceed.  History of Present Illness   The patient, with a history of pancreatitis, presented for a follow-up visit after an MRI. The MRI was performed due to a previous episode of severe pancreatitis during the summer, which was characterized by intense pain, described as  worse than childbirth. The MRI results showed slight dilation of one of the pancreatic ducts and a condition known as pancreas divisum, a congenital abnormality where the pancreatic ducts do not fuse together normally. This condition was suspected to be the cause of the pancreatitis.  She was seen in the office by Hyacinth Meeker, PA-C on 03/28/2023 and was improving.  The patient reported a significant change in bowel habits, characterized by alternating constipation and diarrhea. She described periods of 2-3 days without bowel movements, followed by a day of frequent diarrhea. This pattern has been ongoing for a long time but has become more pronounced recently. The patient also reported significant bloating and gas, which she found distressing and disruptive to her daily life.  The patient's diet has changed since her hospitalization for pancreatitis. She has been consuming a soft diet, including multiple servings of vanilla pudding cups daily, and has gradually reintroduced other foods such as sandwiches, beef, and butter beans. She has been avoiding greasy and spicy foods. The patient's weight has decreased, but it has been stable recently.  The patient also reported a history of urinary issues and leakage, which have improved significantly. She is planning to travel to [place] for three months and expressed concern about managing her symptoms while away.     MR abdomen with and without contrast plus MRCP 05/17/2023 IMPRESSION: 1.  Pancreas divisum.   2. Prominence of the accessory pancreatic duct within the pancreatic uncinate unchanged compared to prior examination, measuring up to 0.4 cm in caliber and most likely reflecting sequelae of prior pancreatitis. There is otherwise no pancreatic ductal dilatation.  3. No acute inflammatory findings on today's examination. No mass or suspicious contrast enhancement.   07/13/2021 EGD with no endoscopic esophageal abnormality, esophagus  empirically dilated 05/19/2021 office visit with Dr. Leone Payor for esophageal dysphagia, SIBO and IBS-mixed; plan:-Repeat EGD with possible dilation for dysphagia, prescribed Metronidazole for SIBO Colonoscopy January 05, 2021, no evidence of collagenous colitis on random biopsies they were normal as were areas of slightly friable right colon mucosa.    Wt Readings from Last 3 Encounters:  06/10/23 142 lb (64.4 kg)  04/18/23 143 lb (64.9 kg)  03/28/23 146 lb 2 oz (66.3 kg)   August 2024 158 pounds 2022 149# Allergies  Allergen Reactions   Latex    Adhesive [Tape] Rash   Aspirin Other (See Comments)    Aggravates Acid Reflux   Nicotinamide [Niacinamide] Rash   Prednisone Palpitations    Made pts heart race   Current Meds  Medication Sig   ALPRAZolam (XANAX) 0.5 MG tablet Take 1 tablet by mouth at bedtime as needed.   Coenzyme Q10 (COQ10 PO) Take 1 tablet by mouth daily.   diphenoxylate-atropine (LOMOTIL) 2.5-0.025 MG tablet Take 1 tablet by mouth 4 (four) times daily as needed for diarrhea or loose stools.   escitalopram (LEXAPRO) 10 MG tablet Take 1 tablet (10 mg total) by mouth daily.   famotidine (PEPCID) 40 MG tablet Take 1 tablet (40 mg total) by mouth 2 (two) times daily.   flecainide (TAMBOCOR) 100 MG tablet Take 1 tablet (100 mg total) by mouth 2 (two) times daily.   ibandronate (BONIVA) 150 MG tablet Take 1 tablet (150 mg total) by mouth every 30 (thirty) days.   metoprolol succinate (TOPROL-XL) 25 MG 24 hr tablet Take 0.5 tablets (12.5 mg total) by mouth daily. Take with or immediately following a meal.   rivaroxaban (XARELTO) 20 MG TABS tablet Take 1 tablet (20 mg total) by mouth daily with supper. NEEDS CARDIOLOGY APPT FOR REFILLS, CALL OFFICE   rivaroxaban (XARELTO) 20 MG TABS tablet Take 1 tablet (20 mg total) by mouth daily with supper.   rosuvastatin (CRESTOR) 10 MG tablet Take 1 tablet (10 mg total) by mouth daily.   Past Medical History:  Diagnosis Date   Actinic  keratosis 07/13/2020   R lat thigh - bx proven    Arthritis    "fingers; right shoulder"   Atypical mole 12/10/2012   left sacral (severe)   Blood transfusion without reported diagnosis    Broken ribs    C. difficile diarrhea    Cataract    Clostridium difficile colitis 04/01/2016   Collagenous colitis    Coronary Calcium    a. 11/2014 Cardiac CT: Ca2+ score = 50 - all in prox LAD. 69th %'ile for age/sex; b. 05/2015 Ex MV: EF 73%. No infarct/ischemia-->Low risk.   Diverticulosis    Esophageal candidiasis (HCC)    GERD (gastroesophageal reflux disease)    Heart murmur    Hiatal hernia 2004   Hyperlipidemia    IBS (irritable bowel syndrome)    Internal hemorrhoids 2010   Colon-Dr. Mechele Collin    Kidney stones    Midsternal chest pain    a. 01/2011 Ex Mv: EF 68%, No ischemia.   Migraine 01/16/2012   "none now for several years"   Mild Mitral regurgitation    a. 12/2011 Echo: EF 55-60%, no rwma, No evidence of MVP, Mild MR   Osteoporosis    PAF (paroxysmal atrial fibrillation) (HCC)    a. diagnosed 12/2011-->s/p DCCV-->flecainide/Xarelto (CHA2DS2VASc =  2).   Pancreas divisum of native pancreas    probable by CT   Pterygium eye, bilateral    Schatzki's ring 2010    EGD- Dr. Mechele Collin    Small intestinal bacterial overgrowth 06/25/2018   Squamous cell carcinoma of skin 03/31/2014   left mid pretibial, right mid pretibial sup, right mid pretibial inf   Squamous cell carcinoma of skin 05/19/2014   right ant pretibial inf   Squamous cell carcinoma of skin 09/05/2015   left dorsum wrist   Squamous cell carcinoma of skin 11/10/2015   left ant distal thigh   Squamous cell carcinoma of skin 11/26/2017   right prox calf   Squamous cell carcinoma of skin 07/09/2018   left prox lat pretibial   Squamous cell carcinoma of skin 12/02/2018   left pretibial   Squamous cell carcinoma of skin 05/25/2020   R lower leg anterior    Squamous cell carcinoma of skin 07/13/2020   R ant thigh     Squamous cell carcinoma of skin 11/28/2020   Left distal lat pretibial near ankle - EDC   Squamous cell carcinoma of skin 02/22/2021   L med knee - ED&C   Squamous cell carcinoma of skin 02/22/2021   L mid to distal pretibial - ED&C   Squamous cell carcinoma of skin 07/11/2021   Right medial pretibial - EDC   Squamous cell carcinoma of skin 11/15/2021   Right medial thigh - EDC   Squamous cell carcinoma of skin 11/23/2021   right medial ankle, EDC   Squamous cell carcinoma of skin 02/15/2022   R ant ankle - ED&C   Squamous cell carcinoma of skin 02/15/2022   right medial lower leg above ankle inferior - scheduled for ED&C   Squamous cell carcinoma of skin 02/15/2022   right medial lower leg above ankle superior - scheduled for ED&C   Squamous cell carcinoma of skin 07/26/2022   left lateral knee, EDC   Squamous cell carcinoma of skin 02/21/2023   Left anterior thigh. WD SCC with superficial infiltration. EDC.   Past Surgical History:  Procedure Laterality Date   APPENDECTOMY  1963   BREAST BIOPSY     BREAST BIOPSY Right 09/2020   pt had a bx done in [place] heart clip pt stated neg bx   BREAST CYST ASPIRATION Bilateral    "several; both sides"   CARDIOVERSION  01/16/2012   Procedure: CARDIOVERSION;  Surgeon: Marinus Maw, MD;  Location: [facility] OR;  Service: Cardiovascular;  Laterality: N/A;   COLONOSCOPY  Multiple   CYSTOSCOPY W/ STONE MANIPULATION  1980's   EYE SURGERY     x4   LEG SURGERY     skin cancer removal   OVARIAN CYST REMOVAL  1990's   SHOULDER ARTHROSCOPY W/ ROTATOR CUFF REPAIR Right ~ 2005   SQUAMOUS CELL CARCINOMA EXCISION     "10-12 removed; all over my body - nose, predominately legs"   TONSILLECTOMY AND ADENOIDECTOMY  1975   UPPER GASTROINTESTINAL ENDOSCOPY  Multiple   w/dilation, hiatal hernia   VAGINAL HYSTERECTOMY  1972   Social History   Social History Narrative   Lives in [place] with husband.   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   Review of Systems As per HPI  Objective:   Physical Exam BP 106/70   Pulse (!) 55   Ht 5\' 7"  (1.702 m)   Wt 142 lb (64.4 kg)   BMI 22.24 kg/m  Abdomen is soft and nontender without organomegaly or mass except for mild tenderness in the epigastrium with deep palpation.

## 2023-07-08 DIAGNOSIS — M5412 Radiculopathy, cervical region: Secondary | ICD-10-CM | POA: Diagnosis not present

## 2023-07-08 DIAGNOSIS — M9902 Segmental and somatic dysfunction of thoracic region: Secondary | ICD-10-CM | POA: Diagnosis not present

## 2023-07-08 DIAGNOSIS — M9901 Segmental and somatic dysfunction of cervical region: Secondary | ICD-10-CM | POA: Diagnosis not present

## 2023-07-08 DIAGNOSIS — M542 Cervicalgia: Secondary | ICD-10-CM | POA: Diagnosis not present

## 2023-07-09 ENCOUNTER — Ambulatory Visit: Payer: PPO | Admitting: Dermatology

## 2023-07-09 DIAGNOSIS — L821 Other seborrheic keratosis: Secondary | ICD-10-CM

## 2023-07-09 DIAGNOSIS — W908XXA Exposure to other nonionizing radiation, initial encounter: Secondary | ICD-10-CM | POA: Diagnosis not present

## 2023-07-09 DIAGNOSIS — M9902 Segmental and somatic dysfunction of thoracic region: Secondary | ICD-10-CM | POA: Diagnosis not present

## 2023-07-09 DIAGNOSIS — L570 Actinic keratosis: Secondary | ICD-10-CM

## 2023-07-09 DIAGNOSIS — Z1283 Encounter for screening for malignant neoplasm of skin: Secondary | ICD-10-CM

## 2023-07-09 DIAGNOSIS — M9901 Segmental and somatic dysfunction of cervical region: Secondary | ICD-10-CM | POA: Diagnosis not present

## 2023-07-09 DIAGNOSIS — L578 Other skin changes due to chronic exposure to nonionizing radiation: Secondary | ICD-10-CM | POA: Diagnosis not present

## 2023-07-09 DIAGNOSIS — M5412 Radiculopathy, cervical region: Secondary | ICD-10-CM | POA: Diagnosis not present

## 2023-07-09 DIAGNOSIS — M542 Cervicalgia: Secondary | ICD-10-CM | POA: Diagnosis not present

## 2023-07-09 DIAGNOSIS — Z8589 Personal history of malignant neoplasm of other organs and systems: Secondary | ICD-10-CM

## 2023-07-09 DIAGNOSIS — L820 Inflamed seborrheic keratosis: Secondary | ICD-10-CM | POA: Diagnosis not present

## 2023-07-09 DIAGNOSIS — Z85828 Personal history of other malignant neoplasm of skin: Secondary | ICD-10-CM

## 2023-07-09 NOTE — Progress Notes (Signed)
Follow-Up Visit   Subjective  [name] is a 77 y.o. female who presents for the following: Ak follow up, recheck sun exposed areas.   The patient has spots, moles and lesions to be evaluated, some may be new or changing and the patient may have concern these could be cancer.   The following portions of the chart were reviewed this encounter and updated as appropriate: medications, allergies, medical history  Review of Systems:  No other skin or systemic complaints except as noted in HPI or Assessment and Plan.  Objective  Well appearing patient in no apparent distress; mood and affect are within normal limits.   A focused examination was performed of the following areas: the face, arms, and hands   Relevant exam findings are noted in the Assessment and Plan.  B/L leg > 30 (30) Stuck on waxy paps with erythema B/L leg > 30 (30) Erythematous thin papules/macules with gritty scale.   Assessment & Plan   ACTINIC DAMAGE - chronic, secondary to cumulative UV radiation exposure/sun exposure over time - diffuse scaly erythematous macules with underlying dyspigmentation - Recommend daily broad spectrum sunscreen SPF 30+ to sun-exposed areas, reapply every 2 hours as needed.  - Recommend staying in the shade or wearing long sleeves, sun glasses (UVA+UVB protection) and wide brim hats (4-inch brim around the entire circumference of the hat). - Call for new or changing lesions.  SEBORRHEIC KERATOSIS - Stuck-on, waxy, tan-brown papules and/or plaques  - Benign-appearing - Discussed benign etiology and prognosis. - Observe - Call for any changes  HISTORY OF SQUAMOUS CELL CARCINOMA OF THE SKIN - No evidence of recurrence today - No lymphadenopathy - Recommend regular full body skin exams - Recommend daily broad spectrum sunscreen SPF 30+ to sun-exposed areas, reapply every 2 hours as needed.  - Call if any new or changing lesions are noted between office visits  INFLAMED  SEBORRHEIC KERATOSIS (30) B/L leg > 30 (30) Symptomatic, irritating, patient would like treated.  Destruction of lesion - B/L leg > 30 (30) Complexity: simple   Destruction method: cryotherapy   Informed consent: discussed and consent obtained   Timeout:  patient name, date of birth, surgical site, and procedure verified Lesion destroyed using liquid nitrogen: Yes   Region frozen until ice ball extended beyond lesion: Yes   Outcome: patient tolerated procedure well with no complications   Post-procedure details: wound care instructions given   ACTINIC KERATOSIS (30) B/L leg > 30 (30) Actinic keratoses are precancerous spots that appear secondary to cumulative UV radiation exposure/sun exposure over time. They are chronic with expected duration over 1 year. A portion of actinic keratoses will progress to squamous cell carcinoma of the skin. It is not possible to reliably predict which spots will progress to skin cancer and so treatment is recommended to prevent development of skin cancer.  Recommend daily broad spectrum sunscreen SPF 30+ to sun-exposed areas, reapply every 2 hours as needed.  Recommend staying in the shade or wearing long sleeves, sun glasses (UVA+UVB protection) and wide brim hats (4-inch brim around the entire circumference of the hat). Call for new or changing lesions. Destruction of lesion - B/L leg > 30 (30) Complexity: simple   Destruction method: cryotherapy   Informed consent: discussed and consent obtained   Timeout:  patient name, date of birth, surgical site, and procedure verified Lesion destroyed using liquid nitrogen: Yes   Region frozen until ice ball extended beyond lesion: Yes   Outcome: patient tolerated  procedure well with no complications   Post-procedure details: wound care instructions given    Return in about 6 months (around 01/07/2024).  Maylene Roes, CMA, am acting as scribe for Armida Sans, MD .  Documentation: I have reviewed the  above documentation for accuracy and completeness, and I agree with the above.  Armida Sans, MD

## 2023-07-09 NOTE — Patient Instructions (Signed)

## 2023-07-10 DIAGNOSIS — M5412 Radiculopathy, cervical region: Secondary | ICD-10-CM | POA: Diagnosis not present

## 2023-07-10 DIAGNOSIS — M542 Cervicalgia: Secondary | ICD-10-CM | POA: Diagnosis not present

## 2023-07-10 DIAGNOSIS — M9902 Segmental and somatic dysfunction of thoracic region: Secondary | ICD-10-CM | POA: Diagnosis not present

## 2023-07-10 DIAGNOSIS — M9901 Segmental and somatic dysfunction of cervical region: Secondary | ICD-10-CM | POA: Diagnosis not present

## 2023-07-11 DIAGNOSIS — M9902 Segmental and somatic dysfunction of thoracic region: Secondary | ICD-10-CM | POA: Diagnosis not present

## 2023-07-11 DIAGNOSIS — M542 Cervicalgia: Secondary | ICD-10-CM | POA: Diagnosis not present

## 2023-07-11 DIAGNOSIS — M9901 Segmental and somatic dysfunction of cervical region: Secondary | ICD-10-CM | POA: Diagnosis not present

## 2023-07-11 DIAGNOSIS — M5412 Radiculopathy, cervical region: Secondary | ICD-10-CM | POA: Diagnosis not present

## 2023-07-13 ENCOUNTER — Encounter: Payer: Self-pay | Admitting: *Deleted

## 2023-07-13 ENCOUNTER — Other Ambulatory Visit: Payer: Self-pay

## 2023-07-13 ENCOUNTER — Ambulatory Visit
Admission: EM | Admit: 2023-07-13 | Discharge: 2023-07-13 | Disposition: A | Discharge status: Home / Self Care | Payer: PPO | Attending: Emergency Medicine | Admitting: Emergency Medicine

## 2023-07-13 DIAGNOSIS — L820 Inflamed seborrheic keratosis: Secondary | ICD-10-CM | POA: Diagnosis not present

## 2023-07-13 MED ORDER — TRIAMCINOLONE ACETONIDE 0.1 % EX CREA: 1.0000 | TOPICAL_CREAM | Freq: Two times a day (BID) | CUTANEOUS | 0 refills | Status: DC

## 2023-07-13 MED ORDER — KETOROLAC TROMETHAMINE 30 MG/ML IJ SOLN
30.0000 mg | Freq: Once | INTRAMUSCULAR | Status: AC
  Administered 2023-07-13: 30 mg via INTRAMUSCULAR

## 2023-07-13 NOTE — Discharge Instructions (Signed)
Your evaluated for your leg pain  You have been given an injection of Toradol which helps to reduce inflammation and helps with pain and ideally will start to see if some relief within the hour  Once home you may use triamcinolone cream twice daily over the affected area, this is also a steroid cream and ideally will help to reduce inflammation and help with your pain  May continue use of calamine lotion to help with itching, may also try topical Benadryl cream  May also try lidocaine cream which helps to numb  May take Tylenol every 6 hours as needed for pain  Can continue ice packs over the affected area as well as elevation and wrapping if helpful  If your symptoms continue to persist please notify your doctor on Monday

## 2023-07-13 NOTE — ED Triage Notes (Signed)
Pt reports she had treatment on skin Cancers on WED . Pt has had burning pain to sites  since treatment.

## 2023-07-13 NOTE — ED Provider Notes (Signed)
Renaldo Fiddler    CSN: [id number] Arrival date & time: 07/13/23  1119      History   Chief Complaint Chief Complaint  Patient presents with   skin burning    HPI GRACELEIGH KUBECKA is a 77 y.o. female.   Present for evaluation of bilateral lower extremity pain beginning 3 days ago after completing [place] therapy for Sarubbi keratosis.  Endorses persistent itching and a burning stinging sensation to the legs.  Has slightly improved but has not resolved.  Endorses that she believes 62 lesions were treated.  Has applied Vaseline over the affected area as instructed as well as calamine lotion and use ice packs to help soothe the skin.  Associated swelling and erythema has improved.  Denies drainage or fever.  Past Medical History:  Diagnosis Date   Actinic keratosis 07/13/2020   R lat thigh - bx proven    Arthritis    "fingers; right shoulder"   Atypical mole 12/10/2012   left sacral (severe)   Blood transfusion without reported diagnosis    Broken ribs    C. difficile diarrhea    Cataract    Clostridium difficile colitis 04/01/2016   Collagenous colitis    Coronary Calcium    a. 11/2014 Cardiac CT: Ca2+ score = 50 - all in prox LAD. 69th %'ile for age/sex; b. 05/2015 Ex MV: EF 73%. No infarct/ischemia-->Low risk.   Diverticulosis    Esophageal candidiasis (HCC)    GERD (gastroesophageal reflux disease)    Heart murmur    Hiatal hernia 2004   Hyperlipidemia    IBS (irritable bowel syndrome)    Internal hemorrhoids 2010   Colon-Dr. Mechele Collin    Kidney stones    Midsternal chest pain    a. 01/2011 Ex Mv: EF 68%, No ischemia.   Migraine 01/16/2012   "none now for several years"   Mild Mitral regurgitation    a. 12/2011 Echo: EF 55-60%, no rwma, No evidence of MVP, Mild MR   Osteoporosis    PAF (paroxysmal atrial fibrillation) (HCC)    a. diagnosed 12/2011-->s/p DCCV-->flecainide/Xarelto (CHA2DS2VASc = 2).   Pancreas divisum of native pancreas    probable by CT    Pterygium eye, bilateral    Schatzki's ring 2010    EGD- Dr. Mechele Collin    Small intestinal bacterial overgrowth 06/25/2018   Squamous cell carcinoma of skin 03/31/2014   left mid pretibial, right mid pretibial sup, right mid pretibial inf   Squamous cell carcinoma of skin 05/19/2014   right ant pretibial inf   Squamous cell carcinoma of skin 09/05/2015   left dorsum wrist   Squamous cell carcinoma of skin 11/10/2015   left ant distal thigh   Squamous cell carcinoma of skin 11/26/2017   right prox calf   Squamous cell carcinoma of skin 07/09/2018   left prox lat pretibial   Squamous cell carcinoma of skin 12/02/2018   left pretibial   Squamous cell carcinoma of skin 05/25/2020   R lower leg anterior    Squamous cell carcinoma of skin 07/13/2020   R ant thigh    Squamous cell carcinoma of skin 11/28/2020   Left distal lat pretibial near ankle - EDC   Squamous cell carcinoma of skin 02/22/2021   L med knee - ED&C   Squamous cell carcinoma of skin 02/22/2021   L mid to distal pretibial - ED&C   Squamous cell carcinoma of skin 07/11/2021   Right medial pretibial - EDC   Squamous cell  carcinoma of skin 11/15/2021   Right medial thigh - EDC   Squamous cell carcinoma of skin 11/23/2021   right medial ankle, EDC   Squamous cell carcinoma of skin 02/15/2022   R ant ankle - ED&C   Squamous cell carcinoma of skin 02/15/2022   right medial lower leg above ankle inferior - scheduled for ED&C   Squamous cell carcinoma of skin 02/15/2022   right medial lower leg above ankle superior - scheduled for ED&C   Squamous cell carcinoma of skin 07/26/2022   left lateral knee, EDC   Squamous cell carcinoma of skin 02/21/2023   Left anterior thigh. WD SCC with superficial infiltration. EDC.    Patient Active Problem List   Diagnosis Date Noted   Pancreatic mass 03/15/2023   Abdominal pain, epigastric 03/14/2023   Acute pancreatitis 03/13/2023   Nausea and vomiting 03/13/2023   Diarrhea  12/02/2020   Flatulence 12/02/2020   Bloating 12/02/2020   Chronic anticoagulation 12/02/2020   Abdominal pain 12/02/2020   Small intestinal bacterial overgrowth 06/25/2018   Chronic atrial fibrillation (HCC) 05/16/2018   Ovarian failure 05/16/2018   Need for vaccination against Streptococcus pneumoniae using pneumococcal conjugate vaccine 13 05/16/2018   Vitamin D deficiency 05/16/2018   Urinary tract infection with hematuria 11/27/2017   Dysuria 11/27/2017   Generalized anxiety disorder 11/27/2017   Paroxysmal atrial fibrillation (HCC) 12/08/2016   Lactose intolerance 08/06/2016   Cellulitis 11/08/2015   Screening for breast cancer 02/22/2014   Anxiety 02/25/2012   Smoking 01/23/2012   Chest pain 02/08/2011   Gastroesophageal reflux disease 12/29/2008   Irritable bowel syndrome with both constipation and diarrhea 12/29/2008   OSTEOPOROSIS 12/29/2008   Tachycardia 12/29/2008   PALPITATIONS 12/29/2008   Collagenous colitis 10/08/2007    Past Surgical History:  Procedure Laterality Date   APPENDECTOMY  1963   BREAST BIOPSY     BREAST BIOPSY Right 09/2020   pt had a bx done in [place] heart clip pt stated neg bx   BREAST CYST ASPIRATION Bilateral    "several; both sides"   CARDIOVERSION  01/16/2012   Procedure: CARDIOVERSION;  Surgeon: Marinus Maw, MD;  Location: [facility] OR;  Service: Cardiovascular;  Laterality: N/A;   COLONOSCOPY  Multiple   CYSTOSCOPY W/ STONE MANIPULATION  1980's   EYE SURGERY     x4   LEG SURGERY     skin cancer removal   OVARIAN CYST REMOVAL  1990's   SHOULDER ARTHROSCOPY W/ ROTATOR CUFF REPAIR Right ~ 2005   SQUAMOUS CELL CARCINOMA EXCISION     "10-12 removed; all over my body - nose, predominately legs"   TONSILLECTOMY AND ADENOIDECTOMY  1975   UPPER GASTROINTESTINAL ENDOSCOPY  Multiple   w/dilation, hiatal hernia   VAGINAL HYSTERECTOMY  1972    OB History   No obstetric history on file.      Home Medications    Prior to Admission  medications   Medication Sig Start Date End Date Taking? Authorizing Provider  ALPRAZolam Prudy Feeler) 0.5 MG tablet Take 1 tablet by mouth at bedtime as needed. 01/09/21  Yes [provider]  Coenzyme Q10 (COQ10 PO) Take 1 tablet by mouth daily.   Yes [provider]  diphenoxylate-atropine (LOMOTIL) 2.5-0.025 MG tablet Take 1 tablet by mouth 4 (four) times daily as needed for diarrhea or loose stools. 03/28/23  Yes Unk Lightning, PA  escitalopram (LEXAPRO) 10 MG tablet Take 1 tablet (10 mg total) by mouth daily. 05/16/18  Yes Carlean Jews,  NP  metoprolol succinate (TOPROL-XL) 25 MG 24 hr tablet Take 0.5 tablets (12.5 mg total) by mouth daily. Take with or immediately following a meal. 04/18/23  Yes Gollan, Tollie Pizza, MD  rivaroxaban (XARELTO) 20 MG TABS tablet Take 1 tablet (20 mg total) by mouth daily with supper. NEEDS CARDIOLOGY APPT FOR REFILLS, CALL OFFICE 04/18/23  Yes Gollan, Tollie Pizza, MD  triamcinolone cream (KENALOG) 0.1 % Apply 1 Application topically 2 (two) times daily. 07/13/23  Yes Jarius Dieudonne R, NP  EYSUVIS 0.25 % SUSP Apply 1 drop to eye 3 (three) times daily. Patient not taking: Reported on 06/10/2023 03/13/21   [provider]  famotidine (PEPCID) 40 MG tablet Take 1 tablet (40 mg total) by mouth 2 (two) times daily. 03/28/23   Unk Lightning, PA  flecainide (TAMBOCOR) 100 MG tablet Take 1 tablet (100 mg total) by mouth 2 (two) times daily. 04/18/23   Antonieta Iba, MD  fluorouracil (EFUDEX) 5 % cream Apply to the left lower leg BID x 7 days. Patient not taking: Reported on 06/10/2023 11/15/21   Deirdre Evener, MD  ibandronate (BONIVA) 150 MG tablet Take 1 tablet (150 mg total) by mouth every 30 (thirty) days. 09/24/19   Carlean Jews, NP  nitroGLYCERIN (NITROSTAT) 0.4 MG SL tablet Place 1 tablet (0.4 mg total) under the tongue every 5 (five) minutes as needed for chest pain. Patient not taking: Reported on 06/10/2023 02/12/22    Antonieta Iba, MD  rivaroxaban (XARELTO) 20 MG TABS tablet Take 1 tablet (20 mg total) by mouth daily with supper. 04/30/23   Antonieta Iba, MD  rosuvastatin (CRESTOR) 10 MG tablet Take 1 tablet (10 mg total) by mouth daily. 04/18/23   Antonieta Iba, MD    Family History Family History  Problem Relation Age of Onset   Alzheimer's disease Mother        Died in [place] 57's.   Heart disease Father        A Fib and CHF - died in Mid 67   Diabetes Father    Kidney disease Father    Irritable bowel syndrome Father    Stomach cancer Neg Hx    Pancreatic cancer Neg Hx    Esophageal cancer Neg Hx    Colon cancer Neg Hx    Rectal cancer Neg Hx     Social History Social History   Tobacco Use   Smoking status: Former    Current packs/day: 0.00    Average packs/day: 0.1 packs/day for 40.0 years (4.0 ttl pk-yrs)    Types: Cigarettes    Start date: 01/16/1972    Quit date: 01/16/2012    Years since quitting: 11.4   Smokeless tobacco: Never  Vaping Use   Vaping status: Never Used  Substance Use Topics   Alcohol use: Not Currently    Alcohol/week: 0.0 standard drinks of alcohol   Drug use: No     Allergies   Latex, Adhesive [tape], Aspirin, Nicotinamide [niacinamide], and Prednisone   Review of Systems Review of Systems   Physical Exam Triage Vital Signs ED Triage Vitals  Encounter Vitals Group     BP 07/13/23 1155 115/72     Systolic BP Percentile --      Diastolic BP Percentile --      Pulse Rate 07/13/23 1155 (!) 53     Resp 07/13/23 1155 18     Temp 07/13/23 1155 98.2 F (36.8 C)     Temp src --  SpO2 07/13/23 1155 97 %     Weight --      Height --      Head Circumference --      Peak Flow --      Pain Score 07/13/23 1152 5     Pain Loc --      Pain Education --      Exclude from Growth Chart --    No data found.  Updated Vital Signs BP 115/72   Pulse (!) 53   Temp 98.2 F (36.8 C)   Resp 18   SpO2 97%   Visual Acuity Right Eye  Distance:   Left Eye Distance:   Bilateral Distance:    Right Eye Near:   Left Eye Near:    Bilateral Near:     Physical Exam Constitutional:      Appearance: Normal appearance.  Eyes:     Extraocular Movements: Extraocular movements intact.  Pulmonary:     Effort: Pulmonary effort is normal.  Skin:    Comments: Scattered keratosis to the bilateral lower extremities extending from the top of the knees to the ankle, no erythema, swelling or drainage  Neurological:     Mental Status: She is alert and oriented to person, place, and time. Mental status is at baseline.      UC Treatments / Results  Labs (all labs ordered are listed, but only abnormal results are displayed) Labs Reviewed - No data to display  EKG   Radiology No results found.  Procedures Procedures (including critical care time)  Medications Ordered in UC Medications  ketorolac (TORADOL) 30 MG/ML injection 30 mg (30 mg Intramuscular Given 07/13/23 1221)    Initial Impression / Assessment and Plan / UC Course  I have reviewed the triage vital signs and the nursing notes.  Pertinent labs & imaging results that were available during my care of the patient were reviewed by me and considered in my medical decision making (see chart for details).  Inflamed seborrheic keratosis   No signs of infection on exam, most likely result of recent [place] therapy treatment, Toradol IM given, unable to tolerate oral steroids nephro prescribed triamcinolone cream recommended continued use of calamine lotion, ice packs, may also attempt Benadryl cream and lidocaine, advised notification of her doctor if symptoms continue to persist Final Clinical Impressions(s) / UC Diagnoses   Final diagnoses:  Inflamed seborrheic keratosis     Discharge Instructions      Your evaluated for your leg pain  You have been given an injection of Toradol which helps to reduce inflammation and helps with pain and ideally will start to  see if some relief within the hour  Once home you may use triamcinolone cream twice daily over the affected area, this is also a steroid cream and ideally will help to reduce inflammation and help with your pain  May continue use of calamine lotion to help with itching, may also try topical Benadryl cream  May also try lidocaine cream which helps to numb  May take Tylenol every 6 hours as needed for pain  Can continue ice packs over the affected area as well as elevation and wrapping if helpful  If your symptoms continue to persist please notify your doctor on Monday   ED Prescriptions     Medication Sig Dispense Auth. Provider   triamcinolone cream (KENALOG) 0.1 % Apply 1 Application topically 2 (two) times daily. 80 g Valinda Hoar, NP      PDMP not  reviewed this encounter.   Valinda Hoar, NP 07/13/23 [phone]

## 2023-07-15 ENCOUNTER — Encounter: Payer: Self-pay | Admitting: Dermatology

## 2023-07-15 ENCOUNTER — Telehealth: Payer: Self-pay

## 2023-07-15 NOTE — Telephone Encounter (Signed)
Patient was here with Dr. Gwen Pounds last week and had over 30 places frozen. Patient states her legs were so swollen, red and in so much pain, she went to urgent care for treatment Saturday. Urgent care prescribed a 80g of Triamcinolone to use to legs twice daily. Patient has been using with improvement but already half way through the little jar. Patient is asking would we send in more medication for her.

## 2023-07-16 DIAGNOSIS — M9901 Segmental and somatic dysfunction of cervical region: Secondary | ICD-10-CM | POA: Diagnosis not present

## 2023-07-16 DIAGNOSIS — M5412 Radiculopathy, cervical region: Secondary | ICD-10-CM | POA: Diagnosis not present

## 2023-07-16 DIAGNOSIS — M9902 Segmental and somatic dysfunction of thoracic region: Secondary | ICD-10-CM | POA: Diagnosis not present

## 2023-07-16 DIAGNOSIS — M542 Cervicalgia: Secondary | ICD-10-CM | POA: Diagnosis not present

## 2023-07-18 DIAGNOSIS — M542 Cervicalgia: Secondary | ICD-10-CM | POA: Diagnosis not present

## 2023-07-18 DIAGNOSIS — M9901 Segmental and somatic dysfunction of cervical region: Secondary | ICD-10-CM | POA: Diagnosis not present

## 2023-07-18 DIAGNOSIS — M5412 Radiculopathy, cervical region: Secondary | ICD-10-CM | POA: Diagnosis not present

## 2023-07-18 DIAGNOSIS — M9902 Segmental and somatic dysfunction of thoracic region: Secondary | ICD-10-CM | POA: Diagnosis not present

## 2023-07-18 MED ORDER — TRIAMCINOLONE ACETONIDE 0.1 % EX CREA: 1.0000 | TOPICAL_CREAM | CUTANEOUS | 0 refills | Status: AC

## 2023-07-18 NOTE — Telephone Encounter (Signed)
Spoke to patient and advised a refill of TMC 0.1% cr was sent into [facility]./sh

## 2023-07-19 DIAGNOSIS — M542 Cervicalgia: Secondary | ICD-10-CM | POA: Diagnosis not present

## 2023-07-19 DIAGNOSIS — M5412 Radiculopathy, cervical region: Secondary | ICD-10-CM | POA: Diagnosis not present

## 2023-07-19 DIAGNOSIS — M9901 Segmental and somatic dysfunction of cervical region: Secondary | ICD-10-CM | POA: Diagnosis not present

## 2023-07-19 DIAGNOSIS — M9902 Segmental and somatic dysfunction of thoracic region: Secondary | ICD-10-CM | POA: Diagnosis not present

## 2023-07-29 DIAGNOSIS — M9901 Segmental and somatic dysfunction of cervical region: Secondary | ICD-10-CM | POA: Diagnosis not present

## 2023-07-29 DIAGNOSIS — M9902 Segmental and somatic dysfunction of thoracic region: Secondary | ICD-10-CM | POA: Diagnosis not present

## 2023-07-29 DIAGNOSIS — M5412 Radiculopathy, cervical region: Secondary | ICD-10-CM | POA: Diagnosis not present

## 2023-07-29 DIAGNOSIS — M5414 Radiculopathy, thoracic region: Secondary | ICD-10-CM | POA: Diagnosis not present

## 2023-07-30 DIAGNOSIS — M9902 Segmental and somatic dysfunction of thoracic region: Secondary | ICD-10-CM | POA: Diagnosis not present

## 2023-07-30 DIAGNOSIS — M5412 Radiculopathy, cervical region: Secondary | ICD-10-CM | POA: Diagnosis not present

## 2023-07-30 DIAGNOSIS — M9901 Segmental and somatic dysfunction of cervical region: Secondary | ICD-10-CM | POA: Diagnosis not present

## 2023-07-30 DIAGNOSIS — M5414 Radiculopathy, thoracic region: Secondary | ICD-10-CM | POA: Diagnosis not present

## 2023-08-02 DIAGNOSIS — M5414 Radiculopathy, thoracic region: Secondary | ICD-10-CM | POA: Diagnosis not present

## 2023-08-02 DIAGNOSIS — M9902 Segmental and somatic dysfunction of thoracic region: Secondary | ICD-10-CM | POA: Diagnosis not present

## 2023-08-02 DIAGNOSIS — M9901 Segmental and somatic dysfunction of cervical region: Secondary | ICD-10-CM | POA: Diagnosis not present

## 2023-08-02 DIAGNOSIS — M5412 Radiculopathy, cervical region: Secondary | ICD-10-CM | POA: Diagnosis not present

## 2023-08-06 DIAGNOSIS — M5412 Radiculopathy, cervical region: Secondary | ICD-10-CM | POA: Diagnosis not present

## 2023-08-06 DIAGNOSIS — M9901 Segmental and somatic dysfunction of cervical region: Secondary | ICD-10-CM | POA: Diagnosis not present

## 2023-08-06 DIAGNOSIS — M9902 Segmental and somatic dysfunction of thoracic region: Secondary | ICD-10-CM | POA: Diagnosis not present

## 2023-08-06 DIAGNOSIS — M5414 Radiculopathy, thoracic region: Secondary | ICD-10-CM | POA: Diagnosis not present

## 2023-08-08 DIAGNOSIS — M5414 Radiculopathy, thoracic region: Secondary | ICD-10-CM | POA: Diagnosis not present

## 2023-08-08 DIAGNOSIS — M5412 Radiculopathy, cervical region: Secondary | ICD-10-CM | POA: Diagnosis not present

## 2023-08-08 DIAGNOSIS — M9901 Segmental and somatic dysfunction of cervical region: Secondary | ICD-10-CM | POA: Diagnosis not present

## 2023-08-08 DIAGNOSIS — M9902 Segmental and somatic dysfunction of thoracic region: Secondary | ICD-10-CM | POA: Diagnosis not present

## 2023-08-12 ENCOUNTER — Other Ambulatory Visit: Payer: Self-pay | Admitting: Cardiovascular Disease

## 2023-08-12 DIAGNOSIS — M5414 Radiculopathy, thoracic region: Secondary | ICD-10-CM | POA: Diagnosis not present

## 2023-08-12 DIAGNOSIS — M9902 Segmental and somatic dysfunction of thoracic region: Secondary | ICD-10-CM | POA: Diagnosis not present

## 2023-08-12 DIAGNOSIS — M5412 Radiculopathy, cervical region: Secondary | ICD-10-CM | POA: Diagnosis not present

## 2023-08-12 DIAGNOSIS — I480 Paroxysmal atrial fibrillation: Secondary | ICD-10-CM

## 2023-08-12 DIAGNOSIS — M9901 Segmental and somatic dysfunction of cervical region: Secondary | ICD-10-CM | POA: Diagnosis not present

## 2023-08-13 NOTE — Telephone Encounter (Signed)
Prescription refill request for Xarelto received.  Indication:afib Last office visit:9/24 Weight:64.4  kg Age:78 Scr:1.0 CrCl:47.14  ml/min  Prescription refilled

## 2023-08-26 ENCOUNTER — Telehealth: Payer: Self-pay | Admitting: Cardiovascular Disease

## 2023-08-26 ENCOUNTER — Other Ambulatory Visit: Payer: Self-pay

## 2023-08-26 ENCOUNTER — Other Ambulatory Visit: Payer: Self-pay | Admitting: Cardiovascular Disease

## 2023-08-26 MED ORDER — FLECAINIDE ACETATE 100 MG PO TABS: 100.0000 mg | ORAL_TABLET | Freq: Two times a day (BID) | ORAL | 2 refills | Status: DC

## 2023-08-26 NOTE — Telephone Encounter (Signed)
*  STAT* If patient is at the pharmacy, call can be transferred to refill team.   1. Which medications need to be refilled? (please list name of each medication and dose if known)   flecainide (TAMBOCOR) 100 MG tablet    Pt out of town, please send to [place] pharmacy   4. Which pharmacy/location (including street and city if local pharmacy) is medication to be sent to? [id number] [place], [place] - [phone] AT [facility] [address] Phone: [phone]  Fax: [phone]       5. Do they need a 30 day or 90 day supply? 90    Pt is completely out

## 2023-09-21 ENCOUNTER — Encounter: Payer: Self-pay | Admitting: Cardiovascular Disease

## 2023-09-23 MED ORDER — METOPROLOL SUCCINATE ER 25 MG PO TB24: 12.5000 mg | ORAL_TABLET | Freq: Every day | ORAL | 0 refills | Status: DC

## 2023-10-25 DIAGNOSIS — M79605 Pain in left leg: Secondary | ICD-10-CM | POA: Diagnosis not present

## 2023-10-25 DIAGNOSIS — M869 Osteomyelitis, unspecified: Secondary | ICD-10-CM | POA: Diagnosis not present

## 2023-10-27 DIAGNOSIS — S81811A Laceration without foreign body, right lower leg, initial encounter: Secondary | ICD-10-CM | POA: Diagnosis not present

## 2023-10-27 DIAGNOSIS — L03116 Cellulitis of left lower limb: Secondary | ICD-10-CM | POA: Diagnosis not present

## 2023-10-27 DIAGNOSIS — Z7901 Long term (current) use of anticoagulants: Secondary | ICD-10-CM | POA: Diagnosis not present

## 2023-10-27 DIAGNOSIS — L03115 Cellulitis of right lower limb: Secondary | ICD-10-CM | POA: Diagnosis not present

## 2023-10-29 ENCOUNTER — Telehealth: Payer: Self-pay

## 2023-10-29 DIAGNOSIS — Z8719 Personal history of other diseases of the digestive system: Secondary | ICD-10-CM

## 2023-10-29 DIAGNOSIS — Q453 Other congenital malformations of pancreas and pancreatic duct: Secondary | ICD-10-CM

## 2023-10-29 DIAGNOSIS — K8689 Other specified diseases of pancreas: Secondary | ICD-10-CM

## 2023-10-29 NOTE — Telephone Encounter (Signed)
 It is extremely rare that doxycycline could cause pancreatitis.  There are no medications to prevent pancreatitis.  Safe travels.

## 2023-10-29 NOTE — Telephone Encounter (Signed)
-----   Message from [name] [place] [facility] sent at 06/10/2023 11:33 AM EST ----- Contact her and set up MRI abd MRCP w/wo contrast for late April 2025.They come back from [place] mid April.

## 2023-10-29 NOTE — Telephone Encounter (Signed)
 I reached Cayden and they are coming back to [place] next week. I confirmed good dates for her and will call her back with the appointment date/time.  Her MRI abd MRCP w/wo contrast is set up for 11/13/2023 at [facility] hospital for 10:00am , arrive at 9:30am. NPO midnight. Jersee is aware of the appointment date and time. She wanted me to let Dr Leone Payor know she is currently on doxycyline  100mg   BID for cellulitis she has developed in her Left lower leg. Started as a bruise. She is worried the antibiotic is going to flare her pancreatitis. She wants to know if he advises her to take anything with the doxycyline to prevent a flare. She is in [place] until 9pm today. [facility] if [facility] needs to be sent in.

## 2023-10-29 NOTE — Telephone Encounter (Signed)
 [name] informed and was reassured by this message. She said thank you.

## 2023-11-10 ENCOUNTER — Encounter: Payer: Self-pay | Admitting: Cardiovascular Disease

## 2023-11-11 ENCOUNTER — Other Ambulatory Visit: Payer: Self-pay

## 2023-11-11 DIAGNOSIS — I480 Paroxysmal atrial fibrillation: Secondary | ICD-10-CM

## 2023-11-11 MED ORDER — RIVAROXABAN 15 MG PO TABS
15.0000 mg | ORAL_TABLET | Freq: Every day | ORAL | 5 refills | Status: AC
Start: 2023-11-11 — End: ?

## 2023-11-12 DIAGNOSIS — F411 Generalized anxiety disorder: Secondary | ICD-10-CM | POA: Diagnosis not present

## 2023-11-12 DIAGNOSIS — Z0000 Encounter for general adult medical examination without abnormal findings: Secondary | ICD-10-CM | POA: Diagnosis not present

## 2023-11-12 DIAGNOSIS — M810 Age-related osteoporosis without current pathological fracture: Secondary | ICD-10-CM | POA: Diagnosis not present

## 2023-11-12 DIAGNOSIS — K52831 Collagenous colitis: Secondary | ICD-10-CM | POA: Diagnosis not present

## 2023-11-12 DIAGNOSIS — R7309 Other abnormal glucose: Secondary | ICD-10-CM | POA: Diagnosis not present

## 2023-11-12 DIAGNOSIS — E782 Mixed hyperlipidemia: Secondary | ICD-10-CM | POA: Diagnosis not present

## 2023-11-12 DIAGNOSIS — Z1231 Encounter for screening mammogram for malignant neoplasm of breast: Secondary | ICD-10-CM | POA: Diagnosis not present

## 2023-11-12 DIAGNOSIS — I480 Paroxysmal atrial fibrillation: Secondary | ICD-10-CM | POA: Diagnosis not present

## 2023-11-13 ENCOUNTER — Other Ambulatory Visit: Payer: Self-pay | Admitting: Internal Medicine

## 2023-11-13 ENCOUNTER — Ambulatory Visit (HOSPITAL_COMMUNITY)
Admission: RE | Admit: 2023-11-13 | Discharge: 2023-11-13 | Disposition: A | Discharge status: Home / Self Care | Source: Ambulatory Visit | Attending: Internal Medicine | Admitting: Internal Medicine

## 2023-11-13 ENCOUNTER — Other Ambulatory Visit: Payer: Self-pay | Admitting: Physician Assistant

## 2023-11-13 DIAGNOSIS — K862 Cyst of pancreas: Secondary | ICD-10-CM | POA: Diagnosis not present

## 2023-11-13 DIAGNOSIS — K859 Acute pancreatitis without necrosis or infection, unspecified: Secondary | ICD-10-CM | POA: Diagnosis not present

## 2023-11-13 DIAGNOSIS — Q453 Other congenital malformations of pancreas and pancreatic duct: Secondary | ICD-10-CM | POA: Diagnosis not present

## 2023-11-13 DIAGNOSIS — I700 Atherosclerosis of aorta: Secondary | ICD-10-CM | POA: Diagnosis not present

## 2023-11-13 DIAGNOSIS — Z8719 Personal history of other diseases of the digestive system: Secondary | ICD-10-CM | POA: Insufficient documentation

## 2023-11-13 DIAGNOSIS — K8689 Other specified diseases of pancreas: Secondary | ICD-10-CM | POA: Insufficient documentation

## 2023-11-13 DIAGNOSIS — Z1231 Encounter for screening mammogram for malignant neoplasm of breast: Secondary | ICD-10-CM

## 2023-11-13 MED ORDER — GADOBUTROL 1 MMOL/ML IV SOLN
6.0000 mL | Freq: Once | INTRAVENOUS | Status: AC | PRN
  Administered 2023-11-13: 6 mL via INTRAVENOUS

## 2023-11-14 ENCOUNTER — Encounter: Payer: Self-pay | Admitting: Internal Medicine

## 2023-11-19 DIAGNOSIS — L858 Other specified epidermal thickening: Secondary | ICD-10-CM | POA: Diagnosis not present

## 2023-11-19 DIAGNOSIS — L821 Other seborrheic keratosis: Secondary | ICD-10-CM | POA: Diagnosis not present

## 2023-11-19 DIAGNOSIS — Z85828 Personal history of other malignant neoplasm of skin: Secondary | ICD-10-CM | POA: Diagnosis not present

## 2023-11-19 DIAGNOSIS — D489 Neoplasm of uncertain behavior, unspecified: Secondary | ICD-10-CM | POA: Diagnosis not present

## 2023-11-19 DIAGNOSIS — L720 Epidermal cyst: Secondary | ICD-10-CM | POA: Diagnosis not present

## 2023-11-26 ENCOUNTER — Ambulatory Visit: Admitting: Dermatology

## 2023-11-26 ENCOUNTER — Encounter: Payer: Self-pay | Admitting: Dermatology

## 2023-11-26 DIAGNOSIS — C44629 Squamous cell carcinoma of skin of left upper limb, including shoulder: Secondary | ICD-10-CM | POA: Diagnosis not present

## 2023-11-26 DIAGNOSIS — W908XXA Exposure to other nonionizing radiation, initial encounter: Secondary | ICD-10-CM

## 2023-11-26 DIAGNOSIS — L578 Other skin changes due to chronic exposure to nonionizing radiation: Secondary | ICD-10-CM

## 2023-11-26 DIAGNOSIS — C44729 Squamous cell carcinoma of skin of left lower limb, including hip: Secondary | ICD-10-CM | POA: Diagnosis not present

## 2023-11-26 DIAGNOSIS — C4492 Squamous cell carcinoma of skin, unspecified: Secondary | ICD-10-CM

## 2023-11-26 DIAGNOSIS — D492 Neoplasm of unspecified behavior of bone, soft tissue, and skin: Secondary | ICD-10-CM | POA: Diagnosis not present

## 2023-11-26 DIAGNOSIS — D692 Other nonthrombocytopenic purpura: Secondary | ICD-10-CM | POA: Diagnosis not present

## 2023-11-26 DIAGNOSIS — D485 Neoplasm of uncertain behavior of skin: Secondary | ICD-10-CM

## 2023-11-26 NOTE — Patient Instructions (Addendum)
 Heather Nicoletta Barrier, MD, PhD Address: [address] [place], [place] [address] Phone: [phone]   Wound Care Instructions  Cleanse wound gently with soap and water once a day then pat dry with clean gauze. Apply a thin coat of Petrolatum (petroleum jelly, "Vaseline") over the wound (unless you have an allergy to this). We recommend that you use a new, sterile tube of Vaseline. Do not pick or remove scabs. Do not remove the yellow or white "healing tissue" from the base of the wound.  Cover the wound with fresh, clean, nonstick gauze and secure with paper tape. You may use Band-Aids in place of gauze and tape if the wound is small enough, but would recommend trimming much of the tape off as there is often too much. Sometimes Band-Aids can irritate the skin.  You should call the office for your biopsy report after 1 week if you have not already been contacted.  If you experience any problems, such as abnormal amounts of bleeding, swelling, significant bruising, significant pain, or evidence of infection, please call the office immediately.  FOR ADULT SURGERY PATIENTS: If you need something for pain relief you may take 1 extra strength Tylenol  (acetaminophen ) AND 2 Ibuprofen (200mg  each) together every 4 hours as needed for pain. (do not take these if you are allergic to them or if you have a reason you should not take them.) Typically, you may only need pain medication for 1 to 3 days.     Due to recent changes in healthcare laws, you may see results of your pathology and/or laboratory studies on MyChart before the doctors have had a chance to review them. We understand that in some cases there may be results that are confusing or concerning to you. Please understand that not all results are received at the same time and often the doctors may need to interpret multiple results in order to provide you with the best plan of care or course of treatment. Therefore, we ask that you please give  us  2 business days to thoroughly review all your results before contacting the office for clarification. Should we see a critical lab result, you will be contacted sooner.   If You Need Anything After Your Visit  If you have any questions or concerns for your doctor, please call our main line at [phone] and press option 4 to reach your doctor's medical assistant. If no one answers, please leave a voicemail as directed and we will return your call as soon as possible. Messages left after 4 pm will be answered the following business day.   You may also send us  a message via MyChart. We typically respond to MyChart messages within 1-2 business days.  For prescription refills, please ask your pharmacy to contact our office. Our fax number is [phone].  If you have an urgent issue when the clinic is closed that cannot wait until the next business day, you can page your doctor at the number below.    Please note that while we do our best to be available for urgent issues outside of office hours, we are not available 24/7.   If you have an urgent issue and are unable to reach us , you may choose to seek medical care at your doctor's office, retail clinic, urgent care center, or emergency room.  If you have a medical emergency, please immediately call 911 or go to the emergency department.  Pager Numbers  - Dr. Bary Likes: [phone]  - Dr.  Annette Barters: [phone]  - Dr. Felipe Horton: [phone]   In the event of inclement weather, please call our main line at [phone] for an update on the status of any delays or closures.  Dermatology Medication Tips: Please keep the boxes that topical medications come in in order to help keep track of the instructions about where and how to use these. Pharmacies typically print the medication instructions only on the boxes and not directly on the medication tubes.   If your medication is too expensive, please contact our office at [phone] option 4  or send us  a message through MyChart.   We are unable to tell what your co-pay for medications will be in advance as this is different depending on your insurance coverage. However, we may be able to find a substitute medication at lower cost or fill out paperwork to get insurance to cover a needed medication.   If a prior authorization is required to get your medication covered by your insurance company, please allow us  1-2 business days to complete this process.  Drug prices often vary depending on where the prescription is filled and some pharmacies may offer cheaper prices.  The website www.goodrx.com contains coupons for medications through different pharmacies. The prices here do not account for what the cost may be with help from insurance (it may be cheaper with your insurance), but the website can give you the price if you did not use any insurance.  - You can print the associated coupon and take it with your prescription to the pharmacy.  - You may also stop by our office during regular business hours and pick up a GoodRx coupon card.  - If you need your prescription sent electronically to a different pharmacy, notify our office through [facility] or by phone at [phone] option 4.     Si Usted Necesita Algo Despus de Su Visita  Tambin puede enviarnos un mensaje a travs de Clinical cytogeneticist. Por lo general respondemos a los mensajes de MyChart en el transcurso de 1 a 2 das hbiles.  Para renovar recetas, por favor pida a su farmacia que se ponga en contacto con nuestra oficina. Franz Jacks de fax es [place] [phone].  Si tiene un asunto urgente cuando la clnica est cerrada y que no puede esperar hasta el siguiente da hbil, puede llamar/localizar a su doctor(a) al nmero que aparece a continuacin.   Por favor, tenga en cuenta que aunque hacemos todo lo posible para estar disponibles para asuntos urgentes fuera del horario de [place], no estamos disponibles las 24 horas  del da, los 7 [address]  [address] de la [place].   Si tiene un problema urgente y no puede comunicarse con nosotros, puede optar por buscar atencin mdica  en el consultorio de su doctor(a), en una clnica privada, en un centro de atencin urgente o en una sala de emergencias.  Si tiene Engineer, drilling, por favor llame inmediatamente al 911 o vaya a la sala de emergencias.  Nmeros de bper  - Dr. Bary Likes: [phone]  - Dra. Annette Barters: [phone]  - Dr. Felipe Horton: [phone]   En caso de inclemencias del tiempo, por favor llame a Lajuan Pila principal al [phone] para una actualizacin sobre el [place] de cualquier retraso o cierre.  Consejos para la medicacin en dermatologa: Por favor, guarde las cajas en las que vienen los medicamentos de uso tpico para ayudarle a seguir las instrucciones sobre dnde y cmo usarlos. Las farmacias generalmente imprimen las instrucciones del medicamento slo en las cajas  y no directamente en los tubos del medicamento.   Si su medicamento es muy caro, por favor, pngase en contacto con Bettyjane Brunet llamando al [phone] y presione la opcin 4 o envenos un mensaje a travs de Clinical cytogeneticist.   No podemos decirle cul ser su copago por los medicamentos por adelantado ya que esto es diferente dependiendo de la cobertura de su seguro. Sin embargo, es posible que podamos encontrar un medicamento sustituto a Audiological scientist un formulario para que el seguro cubra el medicamento que se considera necesario.   Si se requiere una autorizacin previa para que su compaa de seguros [place] su medicamento, por favor permtanos de 1 a 2 das hbiles para completar este proceso.  Los precios de los medicamentos varan con frecuencia dependiendo del Environmental consultant de dnde se surte la receta y alguna farmacias pueden ofrecer precios ms baratos.  El sitio web www.goodrx.com tiene cupones para medicamentos de Health and safety inspector. Los precios aqu no tienen en cuenta lo que  podra costar con la ayuda del seguro (puede ser ms barato con su seguro), pero el sitio web puede darle el precio si no utiliz Tourist information centre manager.  - Puede imprimir el cupn correspondiente y llevarlo con su receta a la farmacia.  - Tambin puede pasar por nuestra oficina durante el horario de atencin regular y Education officer, museum una tarjeta de cupones de GoodRx.  - Si necesita que su receta se enve electrnicamente a una farmacia diferente, informe a nuestra oficina a travs de MyChart de [place] o por telfono llamando al [phone] y presione la opcin 4.

## 2023-11-26 NOTE — Progress Notes (Signed)
 Follow-Up Visit   Subjective  [name] is a 78 y.o. female who presents for the following: Lesion on the L pretibial started as a bruise, then a vein appeared in it, then it started bleeding, the next day her foot and ankle was swollen. She was seen by urgent care who prescribed Doxycycline . Area has healed, but pt would like checked today. Irregular skin lesion on the L calf, was seen by [facility] derm and a biopsy was performed. Pt states that lesion is tender to the touch and she would like checked today. Irregular lesion on the L upper arm that is new and she has noticed since returning from [place] .   The patient has spots, moles and lesions to be evaluated, some may be new or changing and the patient may have concern these could be cancer.  The following portions of the chart were reviewed this encounter and updated as appropriate: medications, allergies, medical history  Review of Systems:  No other skin or systemic complaints except as noted in HPI or Assessment and Plan.  Objective  Well appearing patient in no apparent distress; mood and affect are within normal limits.   A focused examination was performed of the following areas: the face, arms, and hands   Relevant exam findings are noted in the Assessment and Plan.  L calf Healing bx site. L upper post arm 0.5 cm keratotic pink papule.   Assessment & Plan   ACTINIC DAMAGE - chronic, secondary to cumulative UV radiation exposure/sun exposure over time - diffuse scaly erythematous macules with underlying dyspigmentation - Recommend daily broad spectrum sunscreen SPF 30+ to sun-exposed areas, reapply every 2 hours as needed.  - Recommend staying in the shade or wearing long sleeves, sun glasses (UVA+UVB protection) and wide brim hats (4-inch brim around the entire circumference of the hat). - Call for new or changing lesions.  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 Recommend Dr. Ricci Chambers since he specializes in non-surgical treatment of numerous skin cancers like patient has. NEOPLASM OF UNCERTAIN BEHAVIOR OF SKIN L upper post arm Skin / nail biopsy Type of biopsy: tangential   Informed consent: discussed and consent obtained   Timeout: patient name, date of birth, surgical site, and procedure verified   Procedure prep:  Patient was prepped and draped in usual sterile fashion Prep type:  Isopropyl alcohol Anesthesia: the lesion was anesthetized in a standard fashion   Anesthetic:  1% lidocaine w/ epinephrine 1-100,000 buffered w/ 8.4% NaHCO3 Instrument used: flexible razor blade   Hemostasis achieved with: pressure, aluminum chloride and electrodesiccation   Outcome: patient tolerated procedure well   Post-procedure details: sterile dressing applied and wound care instructions given   Dressing type: bandage (Mupirocin  2% ointment)   Specimen 1 - Surgical pathology Differential Diagnosis: D48.5 r/o SCC Check Margins: No ACTINIC ELASTOSIS   SOLAR PURPURA (HCC)    Purpura associated with venous stasis on the L lower leg - Chronic; persistent and recurrent.  Treatable, but not curable.   - Stasis in the legs causes chronic leg swelling, which may result in itchy or painful rashes, skin discoloration, skin texture changes, and sometimes ulceration.  Recommend daily graduated compression hose/stockings- easiest to put on first thing in morning, remove at bedtime.  Elevate legs as much as possible. Avoid salt/sodium rich foods. - Violaceous macules and patches - Benign - Related to  trauma, age, sun damage and/or use of blood thinners, chronic use of topical and/or oral steroids - Observe - Can use OTC arnica containing moisturizer such as Dermend Bruise Formula if desired - Call for worsening or other concerns   Return for appointment as scheduled.  Arlinda Lais, CMA, am  acting as scribe for Harris Liming, MD .  Documentation: I have reviewed the above documentation for accuracy and completeness, and I agree with the above.  Harris Liming, MD

## 2023-11-29 LAB — SURGICAL PATHOLOGY

## 2023-12-02 ENCOUNTER — Telehealth: Payer: Self-pay

## 2023-12-02 NOTE — Telephone Encounter (Signed)
-----   Message from [place] sent at 11/30/2023 10:24 AM EDT ----- Diagnosis: L upper post arm :       WELL DIFFERENTIATED SQUAMOUS CELL CARCINOMA    Please call to share diagnosis and discuss treatment options.  Explanation: Biopsy shows a squamous cell skin cancer that has grown beyond the surface of the skin and is invading the second layer of the skin. It has the potential to spread beyond the skin and threaten your health, so I recommend treating it.  Treatment option 1: you return for a 15 minute appointment where we perform electrodesiccation and curettage [facility]). This involves three rounds of scraping and burning to destroy the skin cancer. It has about an 75-85% cure rate and leaves a round wound slightly larger than the skin cancer and leaves a round white scar. No additional pathology is done. If the skin cancer comes back, we would need to do a surgery to remove it.   Treatment option 2: you return for an hour long appointment where we perform a skin surgery. We numb the site of the skin cancer and a safety margin of normal skin around it. We remove the full thickness of skin and close the wound with two layers of stitches. The sample is sent to the lab to check that the skin cancer was fully removed. Return one week later to have wound checked and surface stitches removed. Surgical wound leaves a line scar. Approximately 95% cure rate. Risk of recurrence, bleeding, infection, pain, injury to nearby structures, hypertrophic scar.

## 2023-12-02 NOTE — Telephone Encounter (Signed)
 Advised pt of bx result and discussed treatment.  Scheduled pt for 12/11/23 at 3:45./sh

## 2023-12-11 ENCOUNTER — Encounter: Payer: Self-pay | Admitting: Dermatology

## 2023-12-11 ENCOUNTER — Ambulatory Visit (INDEPENDENT_AMBULATORY_CARE_PROVIDER_SITE_OTHER): Admitting: Dermatology

## 2023-12-11 DIAGNOSIS — C44622 Squamous cell carcinoma of skin of right upper limb, including shoulder: Secondary | ICD-10-CM | POA: Diagnosis not present

## 2023-12-11 DIAGNOSIS — C44629 Squamous cell carcinoma of skin of left upper limb, including shoulder: Secondary | ICD-10-CM | POA: Diagnosis not present

## 2023-12-11 NOTE — Progress Notes (Addendum)
   Follow-Up Visit   Subjective  [name] is a 78 y.o. female who presents for the following: Excision of L upper post arm  WELL DIFFERENTIATED SQUAMOUS CELL CARCINOMA  The following portions of the chart were reviewed this encounter and updated as appropriate: medications, allergies, medical history  Review of Systems:  No other skin or systemic complaints except as noted in HPI or Assessment and Plan.  Objective  Well appearing patient in no apparent distress; mood and affect are within normal limits.  A focused examination was performed of the following areas: Left upper arm  Relevant physical exam findings are noted in the Assessment and Plan.   left upper post arm Pink healing site   Assessment & Plan   SQUAMOUS CELL CARCINOMA OF SKIN OF RIGHT UPPER LIMB, INCLUDING SHOULDER left upper post arm Skin excision  Excision method:  elliptical Lesion length (cm):  0.5 Margin per side (cm):  0.3 Total excision diameter (cm):  1.1 Informed consent: discussed and consent obtained   Timeout: patient name, date of birth, surgical site, and procedure verified   Procedure prep:  Patient was prepped and draped in usual sterile fashion Prep type:  Chlorhexidine Anesthesia: the lesion was anesthetized in a standard fashion   Anesthetic:  1% lidocaine w/ epinephrine 1-100,000 buffered w/ 8.4% NaHCO3 (6 cc) Instrument used: #15 blade   Hemostasis achieved with: suture, pressure and electrodesiccation   Outcome: patient tolerated procedure well with no complications   Additional details:  Superior tag   Skin repair Complexity:  Intermediate Final length (cm):  3.5 Informed consent: discussed and consent obtained   Timeout: patient name, date of birth, surgical site, and procedure verified   Procedure prep:  Patient was prepped and draped in usual sterile fashion Prep type:  Chlorhexidine Anesthesia: the lesion was anesthetized in a standard fashion   Anesthetic:  1% lidocaine  w/ epinephrine 1-100,000 buffered w/ 8.4% NaHCO3 Reason for type of repair: reduce tension to allow closure, reduce the risk of dehiscence, infection, and necrosis, reduce subcutaneous dead space and avoid a hematoma, allow closure of the large defect and preserve normal anatomy   Undermining: edges could be approximated without difficulty   Subcutaneous layers (deep stitches):  Suture size:  4-0 Suture type: Monocryl (poliglecaprone 25)   Stitches:  Buried vertical mattress Fine/surface layer approximation (top stitches):  Suture size:  5-0 Suture type: Prolene (polypropylene)   Stitches comment:  Running locked Suture removal (days):  7 Hemostasis achieved with: suture, pressure and electrodesiccation Outcome: patient tolerated procedure well with no complications   Post-procedure details: sterile dressing applied and wound care instructions given   Dressing type: petrolatum, bandage and pressure dressing   Specimen 1 - Surgical pathology Differential Diagnosis: biopsy proven  WELL DIFFERENTIATED SQUAMOUS CELL CARCINOMA  Check Margins: yes  [phone] Superior tag   Patient reported pain with L lower leg lesion biopsied by [facility] and requested excision. Discussed that management must be done by [facility] provider who performed biopsy. Recommended calling [facility] to discuss results and plan. Patient will call.  Return in about 1 week (around 12/18/2023) for suture removal .    Documentation: I have reviewed the above documentation for accuracy and completeness, and I agree with the above.  Harris Liming, MD

## 2023-12-11 NOTE — Patient Instructions (Addendum)
 Minetta Aly, MD   Wound Care Instructions for After Surgery  On the day following your surgery, you should begin doing daily dressing changes until your sutures are removed: Remove the bandage. Cleanse the wound gently with soap and water.  Make sure you then dry the skin surrounding the wound completely or the tape will not stick to the skin. Do not use cotton balls on the wound. After the wound is clean and dry, apply the ointment (either prescription antibiotic prescribed by your doctor or plain Vaseline if nothing was prescribed) gently with a Q-tip. If you are using a bandaid to cover: Apply a bandaid large enough to cover the entire wound. If you do not have a bandaid large enough to cover the wound OR if you are sensitive to bandaid adhesive: Cut a non-stick pad (such as Telfa) to fit the size of the wound.  Cover the wound with the non-stick pad. If the wound is draining, you may want to add a small amount of gauze on top of the non-stick pad for a little added compression to the area. Use tape to seal the area completely.  For the next 1-2 weeks: Be sure to keep the wound moist with ointment 24/7 to ensure best healing. If you are unable to cover the wound with a bandage to hold the ointment in place, you may need to reapply the ointment several times a day. Do not bend over or lift heavy items to reduce the chance of elevated blood pressure to the wound. Do not participate in particularly strenuous activities.  Below is a list of dressing supplies you might need.  Cotton-tipped applicators - Q-tips Gauze pads (2x2 and/or 4x4) - All-Purpose Sponges New and clean tube of petroleum jelly (Vaseline) OR prescription antibiotic ointment if prescribed Either a bandaid large enough to cover the entire wound OR non-stick dressing material (Telfa) and Tape (Paper or Hypafix)  FOR ADULT SURGERY PATIENTS: If you need something for pain relief, you may take 1 extra strength Tylenol   (acetaminophen ) and 2 ibuprofen (200 mg) together every 4 hours as needed. (Do not take these medications if you are allergic to them or if you know you cannot take them for any other reason). Typically you may only need pain medication for 1-3 days.   Comments on the Post-Operative Period Slight swelling and redness often appear around the wound. This is normal and will disappear within several days following the surgery. The healing wound will drain a brownish-red-yellow discharge during healing. This is a normal phase of wound healing. As the wound begins to heal, the drainage may increase in amount. Again, this drainage is normal. Notify us  if the drainage becomes persistently bloody, excessively swollen, or intensely painful or develops a foul odor or red streaks.  The healing wound will also typically be itchy. This is normal. If you have severe or persistent pain, Notify us  if the discomfort is severe or persistent. Avoid alcoholic beverages when taking pain medicine.  In Case of Wound Hemorrhage A wound hemorrhage is when the bandage suddenly becomes soaked with bright red blood and flows profusely. If this happens, sit down or lie down with your head elevated. If the wound has a dressing on it, do not remove the dressing. Apply pressure to the existing gauze. If the wound is not covered, use a gauze pad to apply pressure and continue applying the pressure for 20 minutes without peeking. DO NOT COVER THE WOUND WITH A LARGE TOWEL OR WASH CLOTH.  Release your hand from the wound site but do not remove the dressing. If the bleeding has stopped, gently clean around the wound. Leave the dressing in place for 24 hours if possible. This wait time allows the blood vessels to close off so that you do not spark a new round of bleeding by disrupting the newly clotted blood vessels with an immediate dressing change. If the bleeding does not subside, continue to hold pressure for 40 minutes. If bleeding  continues, page your physician, contact an After Hours clinic or go to the Emergency Room.  Due to recent changes in healthcare laws, you may see results of your pathology and/or laboratory studies on MyChart before the doctors have had a chance to review them. We understand that in some cases there may be results that are confusing or concerning to you. Please understand that not all results are received at the same time and often the doctors may need to interpret multiple results in order to provide you with the best plan of care or course of treatment. Therefore, we ask that you please give us  2 business days to thoroughly review all your results before contacting the office for clarification. Should we see a critical lab result, you will be contacted sooner.   If You Need Anything After Your Visit  If you have any questions or concerns for your doctor, please call our main line at [phone] and press option 4 to reach your doctor's medical assistant. If no one answers, please leave a voicemail as directed and we will return your call as soon as possible. Messages left after 4 pm will be answered the following business day.   You may also send us  a message via MyChart. We typically respond to MyChart messages within 1-2 business days.  For prescription refills, please ask your pharmacy to contact our office. Our fax number is [phone].  If you have an urgent issue when the clinic is closed that cannot wait until the next business day, you can page your doctor at the number below.    Please note that while we do our best to be available for urgent issues outside of office hours, we are not available 24/7.   If you have an urgent issue and are unable to reach us , you may choose to seek medical care at your doctor's office, retail clinic, urgent care center, or emergency room.  If you have a medical emergency, please immediately call 911 or go to the emergency department.  Pager  Numbers  - Dr. Bary Likes: [phone]  - Dr. Annette Barters: [phone]  - Dr. Felipe Horton: [phone]   In the event of inclement weather, please call our main line at [phone] for an update on the status of any delays or closures.  Dermatology Medication Tips: Please keep the boxes that topical medications come in in order to help keep track of the instructions about where and how to use these. Pharmacies typically print the medication instructions only on the boxes and not directly on the medication tubes.   If your medication is too expensive, please contact our office at [phone] option 4 or send us  a message through MyChart.   We are unable to tell what your co-pay for medications will be in advance as this is different depending on your insurance coverage. However, we may be able to find a substitute medication at lower cost or fill out paperwork to get insurance to cover a needed medication.   If a prior authorization is required  to get your medication covered by your insurance company, please allow us  1-2 business days to complete this process.  Drug prices often vary depending on where the prescription is filled and some pharmacies may offer cheaper prices.  The website www.goodrx.com contains coupons for medications through different pharmacies. The prices here do not account for what the cost may be with help from insurance (it may be cheaper with your insurance), but the website can give you the price if you did not use any insurance.  - You can print the associated coupon and take it with your prescription to the pharmacy.  - You may also stop by our office during regular business hours and pick up a GoodRx coupon card.  - If you need your prescription sent electronically to a different pharmacy, notify our office through [facility] or by phone at [phone] option 4.     Si Usted Necesita Algo Despus de Su Visita  Tambin puede enviarnos un mensaje a travs de  Clinical cytogeneticist. Por lo general respondemos a los mensajes de MyChart en el transcurso de 1 a 2 das hbiles.  Para renovar recetas, por favor pida a su farmacia que se ponga en contacto con nuestra oficina. Franz Jacks de fax es [place] [phone].  Si tiene un asunto urgente cuando la clnica est cerrada y que no puede esperar hasta el siguiente da hbil, puede llamar/localizar a su doctor(a) al nmero que aparece a continuacin.   Por favor, tenga en cuenta que aunque hacemos todo lo posible para estar disponibles para asuntos urgentes fuera del horario de [place], no estamos disponibles las 24 horas del da, los 7 [address]  [address] de la [place].   Si tiene un problema urgente y no puede comunicarse con nosotros, puede optar por buscar atencin mdica  en el consultorio de su doctor(a), en una clnica privada, en un centro de atencin urgente o en una sala de emergencias.  Si tiene Engineer, drilling, por favor llame inmediatamente al 911 o vaya a la sala de emergencias.  Nmeros de bper  - Dr. Bary Likes: [phone]  - Dra. Annette Barters: [phone]  - Dr. Felipe Horton: [phone]   En caso de inclemencias del tiempo, por favor llame a Lajuan Pila principal al [phone] para una actualizacin sobre el [place] de cualquier retraso o cierre.  Consejos para la medicacin en dermatologa: Por favor, guarde las cajas en las que vienen los medicamentos de uso tpico para ayudarle a seguir las instrucciones sobre dnde y cmo usarlos. Las farmacias generalmente imprimen las instrucciones del medicamento slo en las cajas y no directamente en los tubos del [place].   Si su medicamento es muy caro, por favor, pngase en contacto con Bettyjane Brunet llamando al [phone] y presione la opcin 4 o envenos un mensaje a travs de Clinical cytogeneticist.   No podemos decirle cul ser su copago por los medicamentos por adelantado ya que esto es diferente dependiendo de la cobertura de su seguro. Sin embargo, es posible que  podamos encontrar un medicamento sustituto a Audiological scientist un formulario para que el seguro cubra el medicamento que se considera necesario.   Si se requiere una autorizacin previa para que su compaa de seguros [place] su medicamento, por favor permtanos de 1 a 2 das hbiles para completar este proceso.  Los precios de los medicamentos varan con frecuencia dependiendo del Environmental consultant de dnde se surte la receta y alguna farmacias pueden ofrecer precios ms baratos.  El sitio web www.goodrx.com tiene cupones para medicamentos de  diferentes farmacias. Los precios aqu no tienen en cuenta lo que podra costar con la ayuda del seguro (puede ser ms barato con su seguro), pero el sitio web puede darle el precio si no utiliz Tourist information centre manager.  - Puede imprimir el cupn correspondiente y llevarlo con su receta a la farmacia.  - Tambin puede pasar por nuestra oficina durante el horario de atencin regular y Education officer, museum una tarjeta de cupones de GoodRx.  - Si necesita que su receta se enve electrnicamente a una farmacia diferente, informe a nuestra oficina a travs de MyChart de [place] o por telfono llamando al [phone] y presione la opcin 4.

## 2023-12-12 ENCOUNTER — Telehealth: Payer: Self-pay

## 2023-12-12 ENCOUNTER — Ambulatory Visit: Admitting: Physician Assistant

## 2023-12-12 DIAGNOSIS — L570 Actinic keratosis: Secondary | ICD-10-CM | POA: Diagnosis not present

## 2023-12-12 DIAGNOSIS — L858 Other specified epidermal thickening: Secondary | ICD-10-CM | POA: Diagnosis not present

## 2023-12-12 DIAGNOSIS — Z86007 Personal history of in-situ neoplasm of skin: Secondary | ICD-10-CM | POA: Diagnosis not present

## 2023-12-12 DIAGNOSIS — C44722 Squamous cell carcinoma of skin of right lower limb, including hip: Secondary | ICD-10-CM | POA: Diagnosis not present

## 2023-12-12 NOTE — Telephone Encounter (Signed)
 Called to see how she was doing after surgery, patient report she is doing well, no she has no concerns.

## 2023-12-18 LAB — SURGICAL PATHOLOGY

## 2023-12-19 ENCOUNTER — Encounter: Payer: Self-pay | Admitting: Dermatology

## 2023-12-19 ENCOUNTER — Ambulatory Visit: Payer: Self-pay | Admitting: Dermatology

## 2023-12-19 ENCOUNTER — Ambulatory Visit (INDEPENDENT_AMBULATORY_CARE_PROVIDER_SITE_OTHER): Admitting: Dermatology

## 2023-12-19 DIAGNOSIS — Z85828 Personal history of other malignant neoplasm of skin: Secondary | ICD-10-CM

## 2023-12-19 DIAGNOSIS — Z5189 Encounter for other specified aftercare: Secondary | ICD-10-CM

## 2023-12-19 DIAGNOSIS — Z4802 Encounter for removal of sutures: Secondary | ICD-10-CM

## 2023-12-19 DIAGNOSIS — Z48817 Encounter for surgical aftercare following surgery on the skin and subcutaneous tissue: Secondary | ICD-10-CM

## 2023-12-19 NOTE — Patient Instructions (Signed)

## 2023-12-19 NOTE — Progress Notes (Signed)
   Follow-Up Visit   Subjective  [name] is a 78 y.o. female who presents for the following: Suture removal  Pathology showed margins free squamous cell carcinoma  The following portions of the chart were reviewed this encounter and updated as appropriate: medications, allergies, medical history  Review of Systems:  No other skin or systemic complaints except as noted in HPI or Assessment and Plan.  Objective  Well appearing patient in no apparent distress; mood and affect are within normal limits.  Areas Examined: The L arm  Relevant physical exam findings are noted in the Assessment and Plan.    Assessment & Plan   VISIT FOR WOUND CHECK   ENCOUNTER FOR REMOVAL OF SUTURES   Encounter for Removal of Sutures - Incision site is clean, dry and intact. - Wound cleansed, sutures removed, wound cleansed and steri strips applied.  - Discussed pathology results showing a margins free squamous cell carcinoma at the L upper post arm - Patient advised to keep steri-strips dry until they fall off. - Scars remodel for a full year. - Once steri-strips fall off, patient can apply over-the-counter silicone scar cream once to twice a day to help with scar remodeling if desired. - Patient advised to call with any concerns or if they notice any new or changing lesions. Patient discussed lesion biopsied at [facility] on L lower leg. Defer to [facility] provider on management Return for appointment as scheduled.  Arlinda Lais, CMA, am acting as scribe for Harris Liming, MD .   Documentation: I have reviewed the above documentation for accuracy and completeness, and I agree with the above.  Harris Liming, MD

## 2024-01-01 ENCOUNTER — Ambulatory Visit: Payer: PPO | Admitting: Dermatology

## 2024-01-01 ENCOUNTER — Encounter: Payer: Self-pay | Admitting: Dermatology

## 2024-01-01 DIAGNOSIS — L821 Other seborrheic keratosis: Secondary | ICD-10-CM

## 2024-01-01 DIAGNOSIS — L578 Other skin changes due to chronic exposure to nonionizing radiation: Secondary | ICD-10-CM

## 2024-01-01 DIAGNOSIS — W908XXA Exposure to other nonionizing radiation, initial encounter: Secondary | ICD-10-CM | POA: Diagnosis not present

## 2024-01-01 DIAGNOSIS — Z85828 Personal history of other malignant neoplasm of skin: Secondary | ICD-10-CM

## 2024-01-01 DIAGNOSIS — L820 Inflamed seborrheic keratosis: Secondary | ICD-10-CM | POA: Diagnosis not present

## 2024-01-01 DIAGNOSIS — L814 Other melanin hyperpigmentation: Secondary | ICD-10-CM | POA: Diagnosis not present

## 2024-01-01 DIAGNOSIS — L570 Actinic keratosis: Secondary | ICD-10-CM | POA: Diagnosis not present

## 2024-01-01 DIAGNOSIS — Z8589 Personal history of malignant neoplasm of other organs and systems: Secondary | ICD-10-CM
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
## 2024-01-01 NOTE — Patient Instructions (Addendum)

## 2024-02-26 DIAGNOSIS — M810 Age-related osteoporosis without current pathological fracture: Secondary | ICD-10-CM | POA: Diagnosis not present

## 2024-03-04 DIAGNOSIS — M810 Age-related osteoporosis without current pathological fracture: Secondary | ICD-10-CM | POA: Diagnosis not present

## 2024-03-05 DIAGNOSIS — L570 Actinic keratosis: Secondary | ICD-10-CM | POA: Diagnosis not present

## 2024-03-05 DIAGNOSIS — Z85828 Personal history of other malignant neoplasm of skin: Secondary | ICD-10-CM | POA: Diagnosis not present

## 2024-03-05 DIAGNOSIS — L578 Other skin changes due to chronic exposure to nonionizing radiation: Secondary | ICD-10-CM | POA: Diagnosis not present

## 2024-03-18 ENCOUNTER — Telehealth: Payer: Self-pay | Admitting: *Deleted

## 2024-03-18 ENCOUNTER — Encounter: Payer: Self-pay | Admitting: Gastroenterology

## 2024-03-18 ENCOUNTER — Other Ambulatory Visit

## 2024-03-18 ENCOUNTER — Ambulatory Visit (INDEPENDENT_AMBULATORY_CARE_PROVIDER_SITE_OTHER): Admitting: Gastroenterology

## 2024-03-18 VITALS — BP 118/66 | HR 68 | Ht 67.0 in | Wt 143.0 lb

## 2024-03-18 DIAGNOSIS — K580 Irritable bowel syndrome with diarrhea: Secondary | ICD-10-CM

## 2024-03-18 DIAGNOSIS — M810 Age-related osteoporosis without current pathological fracture: Secondary | ICD-10-CM | POA: Diagnosis not present

## 2024-03-18 DIAGNOSIS — K529 Noninfective gastroenteritis and colitis, unspecified: Secondary | ICD-10-CM

## 2024-03-18 MED ORDER — DIPHENOXYLATE-ATROPINE 2.5-0.025 MG PO TABS
1.0000 | ORAL_TABLET | Freq: Four times a day (QID) | ORAL | 5 refills | Status: AC | PRN
Start: 2024-03-18 — End: ?

## 2024-03-18 MED ORDER — NEOMYCIN SULFATE 500 MG PO TABS: 500.0000 mg | ORAL_TABLET | Freq: Two times a day (BID) | ORAL | 0 refills | Status: AC

## 2024-03-18 MED ORDER — RIFAXIMIN 550 MG PO TABS: 550.0000 mg | ORAL_TABLET | Freq: Three times a day (TID) | ORAL | 0 refills | Status: AC

## 2024-03-18 NOTE — Progress Notes (Signed)
 03/18/2024 [name] [id number] 1946-01-02   HISTORY OF PRESENT ILLNESS: This is a pleasant 78 year old female who is a patient of Dr. Darilyn.  She has a history of C. difficile remotely, history of collagenous colitis that was treated with budesonide  in the past, history of SIBO/CMO that was treated in October 2021 with Xifaxan  and neomycin , history of IBS with mixed constipation and diarrhea, proctalgia fugax.  She presents here again today with complaints of diarrhea with urgency and incontinence and very foul-smelling gas.  Symptoms have been present for many years.  She does Lomotil  as needed.  Very distressed by her symptoms once again.  These have not been really addressed much since 2022 when I last saw her.  Continues to complain of explosive diarrhea with a lot of gas.  She says sometimes she will go a day or 2 without any bowel movements at all.  The diarrhea is extremely distressing.  She went [place] a [place] out [place] and 1 night in a hotel she had incontinence all over the bed in the middle of the night.  She has a lot of Gas-X for the gas.  She says that this is just no way to live, not being able to go places and do things.  She has had some issues with pancreatitis related to pancreatic divisum.  She says that she feels like her diarrhea symptoms have been worse since all the pancreatitis stuff.  Colonoscopy January 05, 2021, no evidence of collagenous colitis on random biopsies they were normal as were areas of slightly friable right colon mucosa.   Fecal fat and pancreatic elastase have been normal in 2022.   Past Medical History:  Diagnosis Date   Actinic keratosis 07/13/2020   R lat thigh - bx proven    Arthritis    fingers; right shoulder   Atypical mole 12/10/2012   left sacral (severe)   Blood transfusion without reported diagnosis    Broken ribs    C. difficile diarrhea    Cataract    Clostridium difficile colitis 04/01/2016   Collagenous colitis     Coronary Calcium     a. 11/2014 Cardiac CT: Ca2+ score = 50 - all in prox LAD. 69th %'ile for age/sex; b. 05/2015 Ex MV: EF 73%. No infarct/ischemia-->Low risk.   Diverticulosis    Esophageal candidiasis (HCC)    GERD (gastroesophageal reflux disease)    Heart murmur    Hiatal hernia 2004   Hyperlipidemia    IBS (irritable bowel syndrome)    Internal hemorrhoids 2010   Colon-Dr. Viktoria    Kidney stones    Midsternal chest pain    a. 01/2011 Ex Mv: EF 68%, No ischemia.   Migraine 01/16/2012   none now for several years   Mild Mitral regurgitation    a. 12/2011 Echo: EF 55-60%, no rwma, No evidence of MVP, Mild MR   Osteoporosis    PAF (paroxysmal atrial fibrillation) (HCC)    a. diagnosed 12/2011-->s/p DCCV-->flecainide /Xarelto  (CHA2DS2VASc = 2).   Pancreas divisum of native pancreas    probable by CT   Pterygium eye, bilateral    Schatzki's ring 2010    EGD- Dr. Viktoria Regal intestinal bacterial overgrowth 06/25/2018   Squamous cell carcinoma of skin 03/31/2014   left mid pretibial, right mid pretibial sup, right mid pretibial inf   Squamous cell carcinoma of skin 05/19/2014   right ant pretibial inf   Squamous cell carcinoma of skin 09/05/2015  left dorsum wrist   Squamous cell carcinoma of skin 11/10/2015   left ant distal thigh   Squamous cell carcinoma of skin 11/26/2017   right prox calf   Squamous cell carcinoma of skin 07/09/2018   left prox lat pretibial   Squamous cell carcinoma of skin 12/02/2018   left pretibial   Squamous cell carcinoma of skin 05/25/2020   R lower leg anterior    Squamous cell carcinoma of skin 07/13/2020   R ant thigh    Squamous cell carcinoma of skin 11/28/2020   Left distal lat pretibial near ankle - EDC   Squamous cell carcinoma of skin 02/22/2021   L med knee - ED&C   Squamous cell carcinoma of skin 02/22/2021   L mid to distal pretibial - ED&C   Squamous cell carcinoma of skin 07/11/2021   Right medial pretibial - EDC    Squamous cell carcinoma of skin 11/15/2021   Right medial thigh - EDC   Squamous cell carcinoma of skin 11/23/2021   right medial ankle, EDC   Squamous cell carcinoma of skin 02/15/2022   R ant ankle - ED&C   Squamous cell carcinoma of skin 02/15/2022   right medial lower leg above ankle inferior - scheduled for ED&C   Squamous cell carcinoma of skin 02/15/2022   right medial lower leg above ankle superior - scheduled for ED&C   Squamous cell carcinoma of skin 07/26/2022   left lateral knee, EDC   Squamous cell carcinoma of skin 02/21/2023   Left anterior thigh. WD SCC with superficial infiltration. EDC.   Squamous cell carcinoma of skin 11/26/2023   L upper post arm, excised 12/11/23   Past Surgical History:  Procedure Laterality Date   APPENDECTOMY  1963   BREAST BIOPSY     BREAST BIOPSY Right 09/2020   pt had a bx done in [place] heart clip pt stated neg bx   BREAST CYST ASPIRATION Bilateral    several; both sides   CARDIOVERSION  01/16/2012   Procedure: CARDIOVERSION;  Surgeon: Danelle LELON Birmingham, MD;  Location: [facility] OR;  Service: Cardiovascular;  Laterality: N/A;   COLONOSCOPY  Multiple   CYSTOSCOPY W/ STONE MANIPULATION  1980's   EYE SURGERY     x4   LEG SURGERY     skin cancer removal   OVARIAN CYST REMOVAL  1990's   SHOULDER ARTHROSCOPY W/ ROTATOR CUFF REPAIR Right ~ 2005   SQUAMOUS CELL CARCINOMA EXCISION     10-12 removed; all over my body - nose, predominately legs   TONSILLECTOMY AND ADENOIDECTOMY  1975   UPPER GASTROINTESTINAL ENDOSCOPY  Multiple   w/dilation, hiatal hernia   VAGINAL HYSTERECTOMY  1972    reports that she quit smoking about 12 years ago. Her smoking use included cigarettes. She started smoking about 52 years ago. She has a 4 pack-year smoking history. She has never used smokeless tobacco. She reports that she does not currently use alcohol. She reports that she does not use drugs. family history includes Alzheimer's disease in her mother; Diabetes in  her father; Heart disease in her father; Irritable bowel syndrome in her father; Kidney disease in her father. Allergies  Allergen Reactions   Latex    Adhesive [Tape] Rash   Aspirin  Other (See Comments)    Aggravates Acid Reflux   Nicotinamide [Niacinamide] Rash   Prednisone  Palpitations    Made pts heart race   Silicone Dermatitis      Outpatient Encounter Medications as of 03/18/2024  Medication Sig   ALPRAZolam  (XANAX ) 0.5 MG tablet Take 1 tablet by mouth at bedtime as needed.   Coenzyme Q10 (COQ10 PO) Take 1 tablet by mouth daily.   diphenoxylate -atropine  (LOMOTIL ) 2.5-0.025 MG tablet Take 1 tablet by mouth 4 (four) times daily as needed for diarrhea or loose stools.   escitalopram  (LEXAPRO ) 10 MG tablet Take 1 tablet (10 mg total) by mouth daily.   EYSUVIS 0.25 % SUSP Apply 1 drop to eye 3 (three) times daily.   famotidine  (PEPCID ) 40 MG tablet Take 1 tablet (40 mg total) by mouth 2 (two) times daily.   flecainide  (TAMBOCOR ) 100 MG tablet Take 1 tablet (100 mg total) by mouth 2 (two) times daily.   fluorouracil  (EFUDEX ) 5 % cream Apply to the left lower leg BID x 7 days.   ibandronate  (BONIVA ) 150 MG tablet Take 1 tablet (150 mg total) by mouth every 30 (thirty) days.   metoprolol  succinate (TOPROL -XL) 25 MG 24 hr tablet Take 0.5 tablets (12.5 mg total) by mouth daily. Take with or immediately following a meal.   nitroGLYCERIN  (NITROSTAT ) 0.4 MG SL tablet Place 1 tablet (0.4 mg total) under the tongue every 5 (five) minutes as needed for chest pain.   Rivaroxaban  (XARELTO ) 15 MG TABS tablet Take 1 tablet (15 mg total) by mouth daily with supper.   rosuvastatin  (CRESTOR ) 10 MG tablet Take 1 tablet (10 mg total) by mouth daily.   rosuvastatin  (CRESTOR ) 5 MG tablet TAKE 1 TABLET BY MOUTH EVERY DAY   triamcinolone  cream (KENALOG ) 0.1 % Apply 1 Application topically as directed. qd to bid to aa legs until clear, avoid face, groin, axilla   No facility-administered encounter  medications on file as of 03/18/2024.    REVIEW OF SYSTEMS  : All other systems reviewed and negative except where noted in the History of Present Illness.   PHYSICAL EXAM: BP 118/66   Pulse 68   Ht 5' 7 (1.702 m)   Wt 143 lb (64.9 kg)   BMI 22.40 kg/m  General: Well developed white female in no acute distress Head: Normocephalic and atraumatic Eyes:  Sclerae anicteric, conjunctiva pink. Ears: Normal auditory acuity Musculoskeletal: Symmetrical with no gross deformities  Skin: No lesions on visible extremities Extremities: No edema  Neurological: Alert oriented x 4, grossly non-focal Psychological:  Alert and cooperative. Normal mood and affect  ASSESSMENT AND PLAN: *Ongoing complaints of diarrhea and foul-smelling gas/flatus *History of collagenous colitis: Was on budesonide  in the past.  Last colonoscopy 2022 with biopsies did not show recurrent collagenous colitis. *IBS with both constipation and diarrhea, diarrhea and gas are definitely the most distressing symptoms.  I wonder though if we need to more aggressively treat the constipation to prevent self catharsis episodes. *History of SIBO and SIMO: Positive study in October 2021.  Treated with combination of Xifaxan  and neomycin , but patient reports no improvement with that treatment. *Remote history of C. Difficile  -I am going to have her repeat a pancreatic fecal elastase since she has had some issues with pancreatitis in regards to pancreatic divisum, etc. recently.  She feels the diarrhea has worsened since all of her pancreatic issues. - Will refill her Lomotil  to use as needed.  Prescription sent to pharmacy. - Is willing to retry treatment again with Xifaxan  and neomycin  to see if that helps.  Prescriptions sent to pharmacy. - Pending the results of the above, I wonder if we need to treat underlying constipation.  Question just half a dose versus  a full dose of MiraLAX daily. -?  If a trial of Creon would be worthwhile  even if pancreatic fecal elastase is normal. -?  If she is a Viberzi candidate although I am not sure since she actually states skips 1 or 2 days at a time without bowel movements.    CC:  Marikay Eva POUR, PA   GI Attending:  Agree w/ above  When I last saw her I did think she needed to promote better defecation (treat constipation) and also recommended pelvic PT - cannot see that she did PT?  I would avoid Viberzi given her hx pancreatitis.  Lupita CHARLENA Commander, MD, NOLIA

## 2024-03-18 NOTE — Telephone Encounter (Signed)
 Per [facility] - Xifaxan  needs a PA

## 2024-03-18 NOTE — Patient Instructions (Addendum)
 We have sent the following medications to your pharmacy for you to pick up at your convenience: Xifaxan  550 mg three times daily for 14 days. Neomycin  500 mg twice daily for 10 days.  Lomotil  1 tablet four times daily as needed.   Your provider has requested that you go to the basement level for lab work before leaving today. Press B on the elevator. The lab is located at the first door on the left as you exit the elevator.  _______________________________________________________  If your blood pressure at your visit was 140/90 or greater, please contact your primary care physician to follow up on this.  _______________________________________________________  If you are age 21 or older, your body mass index should be between 23-30. Your Body mass index is 22.4 kg/m. If this is out of the aforementioned range listed, please consider follow up with your Primary Care Provider.  If you are age 79 or younger, your body mass index should be between 19-25. Your Body mass index is 22.4 kg/m. If this is out of the aformentioned range listed, please consider follow up with your Primary Care Provider.   ________________________________________________________  The [facility] providers would like to encourage you to use MYCHART to communicate with providers for non-urgent requests or questions.  Due to long hold times on the telephone, sending your provider a message by [facility] may be a faster and more efficient way to get a response.  Please allow 48 business hours for a response.  Please remember that this is for non-urgent requests.  _______________________________________________________  [facility] is using a team-based approach to care.  Your team is made up of your doctor and two to three APPS. Our APPS (Nurse Practitioners and Physician Assistants) work with your physician to ensure care continuity for you. They are fully qualified to address your health concerns and develop a  treatment plan. They communicate directly with your gastroenterologist to care for you. Seeing the Advanced Practice Practitioners on your physician's team can help you by facilitating care more promptly, often allowing for earlier appointments, access to diagnostic testing, procedures, and other specialty referrals.

## 2024-03-19 ENCOUNTER — Telehealth: Payer: Self-pay

## 2024-03-19 ENCOUNTER — Other Ambulatory Visit (HOSPITAL_COMMUNITY): Payer: Self-pay

## 2024-03-19 ENCOUNTER — Other Ambulatory Visit

## 2024-03-19 DIAGNOSIS — K529 Noninfective gastroenteritis and colitis, unspecified: Secondary | ICD-10-CM

## 2024-03-19 NOTE — Telephone Encounter (Signed)
 Pharmacy Patient Advocate Encounter   Received notification from Pt Calls Messages that prior authorization for Xifaxan  550MG  tablets is required/requested.   Insurance verification completed.   The patient is insured through [facility] ADVANTAGE/RX ADVANCE .   Per test claim: PA required; PA submitted to above mentioned insurance via Latent Key/confirmation #/EOC [id number] Status is pending

## 2024-03-19 NOTE — Telephone Encounter (Signed)
 PA request has been Submitted. New Encounter has been or will be created for follow up. For additional info see Pharmacy Prior Auth telephone encounter from 03-19-2024.

## 2024-03-20 ENCOUNTER — Encounter: Payer: Self-pay | Admitting: Internal Medicine

## 2024-03-20 NOTE — Telephone Encounter (Signed)
 Pharmacy Patient Advocate Encounter  Received notification from [facility] ADVANTAGE/RX ADVANCE that Prior Authorization for Xifaxan  550MG  tablets has been APPROVED from 03-19-2024 to 04-02-2024   PA #/Case ID/Reference #: [id number]

## 2024-03-20 NOTE — Telephone Encounter (Signed)
 Noted the pt has been advised

## 2024-03-21 ENCOUNTER — Other Ambulatory Visit (HOSPITAL_COMMUNITY): Payer: Self-pay

## 2024-03-24 ENCOUNTER — Other Ambulatory Visit (HOSPITAL_COMMUNITY): Payer: Self-pay

## 2024-03-24 NOTE — Telephone Encounter (Signed)
 It's possible that her insurance prefers her fill at certain pharmacies to keep the co-pay low and when not filled at those pharmacies, her co-pay will change. She will have to contact her insurance on why there was a change in co-pay from one pharmacy to the next as I do not have access to this information.

## 2024-03-26 ENCOUNTER — Ambulatory Visit: Payer: Self-pay | Admitting: Gastroenterology

## 2024-03-26 LAB — PANCREATIC ELASTASE, FECAL: Pancreatic Elastase-1, Stool: >800 ug/g (ref >200)

## 2024-03-27 NOTE — Telephone Encounter (Signed)
 Patient returning call, please advise.

## 2024-03-30 NOTE — Progress Notes (Signed)
Pt reviewed results on mychart. 

## 2024-04-02 NOTE — Telephone Encounter (Signed)
 Called and spoke with pt. She reports that she has completed the treatment for SIBO. She reports that she hasn't had the diarrhea, but now she feels like she may be constipated. She reports that her stools are thin and very small and she doesn't feel like she gets cleaned out. She reports increased gas since completing the abts. Pt states she is unsure where to go from here, but she would like to know what her next steps should be. She is going out of town on Sunday and is not sure how to manage her symptoms.

## 2024-04-02 NOTE — Telephone Encounter (Signed)
 Spoke with pt. Relayed provider recommendations. Discussed starting with a half capful of Miralax. Pt verbalized understanding. States she is going out of town for a few days and then will start the Miralax. Pt will continue Gas-X as needed.

## 2024-04-13 ENCOUNTER — Other Ambulatory Visit: Payer: Self-pay | Admitting: Cardiovascular Disease

## 2024-04-15 ENCOUNTER — Ambulatory Visit: Attending: Student | Admitting: Student

## 2024-04-15 ENCOUNTER — Encounter: Payer: Self-pay | Admitting: Student

## 2024-04-15 ENCOUNTER — Ambulatory Visit

## 2024-04-15 VITALS — BP 116/62 | HR 53 | Ht 67.0 in | Wt 143.0 lb

## 2024-04-15 DIAGNOSIS — I480 Paroxysmal atrial fibrillation: Secondary | ICD-10-CM

## 2024-04-15 DIAGNOSIS — I2510 Atherosclerotic heart disease of native coronary artery without angina pectoris: Secondary | ICD-10-CM

## 2024-04-15 DIAGNOSIS — Z79899 Other long term (current) drug therapy: Secondary | ICD-10-CM

## 2024-04-15 DIAGNOSIS — I341 Nonrheumatic mitral (valve) prolapse: Secondary | ICD-10-CM | POA: Diagnosis not present

## 2024-04-15 DIAGNOSIS — E785 Hyperlipidemia, unspecified: Secondary | ICD-10-CM

## 2024-04-15 DIAGNOSIS — R002 Palpitations: Secondary | ICD-10-CM | POA: Diagnosis not present

## 2024-04-15 DIAGNOSIS — R0609 Other forms of dyspnea: Secondary | ICD-10-CM

## 2024-04-15 NOTE — Progress Notes (Signed)
 Cardiology Clinic Note   Date: 04/15/2024 ID: [name], [name] [birth year], MRN [id number]  Primary Cardiologist:  Evalene Lunger, MD  Chief Complaint   [name] is a 78 y.o. female who presents to the clinic today for evaluation of shortness of breath.   Patient Profile   [name] is followed by Dr. Gollan for the history outlined below.      Past medical history significant for: Nonobstructive CAD.  Coronary CTA 02/19/2022: Coronary calcium  score 340 (7 percentile).  Calcified plaque causing mild stenosis proximal LAD and D1. Mitral valve prolapse. Echo 05/12/2019: EF 55 to 60%.  No RWMA.  Normal RV size/function.  Mild mitral valve prolapse.  Mild to moderate MR.  Severity of MR may be underestimated due to multiple jets/eccentricity.  Mild TR.  Mild dilatation of ascending aorta 36 mm. PAF.  DCCV 2013. 14-day ZIO 05/06/2019: HR 43 to 214 bpm, average 62 bpm.  1 run of NSVT lasting 7 beats max rate 158 bpm.  95 runs of SVT fastest lasting 7 beats max rate 214 bpm, longest 13 beats average rate 115 bpm.  Rare ectopy.  No evidence of A-fib. Hyperlipidemia. Lipid panel 11/12/2023: LDL 67, HDL 68, TG 68, total 149. GERD.  GAD.  Former tobacco abuse.   In summary, patient underwent coronary artery calcium  scoring in 11/2014 with a score of 50 with all calcium  being noted in the proximal LAD which placed her in the 69th percentile for age/sex with follow-up exercise Myoview  in 05/2015 showing no infarct or ischemia with an EF of 73% and was overall a low risk study.  Her A. fib has been managed with flecainide  and Xarelto .  When she was seen in early 2019 she noted occasional episodes of paroxysms of A. Fib, which were generally short-lived, and she would periodically take an extra flecainide .  In this setting, her flecainide  was increased to 75 mg twice daily at that time.  14-day ZIO on October 2020 demonstrated 1 run of NSVT and 95 runs of SVT, no A-fib.  Echo showed normal  LV/RV function with mild mitral valve prolapse and mild to moderate MR.  Patient was last seen in the office by Dr. Gollan on 04/18/2023 for routine follow-up.  She was doing well at that time and reported A-fib well-controlled on flecainide  100 mg twice daily and Toprol  25 mg daily.  She was tolerating Xarelto  for stroke prophylaxis.  No medication changes were made.     History of Present Illness    Today, patient reports over the last few weeks she has noted increased dyspnea with less exertion accompanied by increased palpitations. These palpitations feel different than when she goes into afib. She describes her heart pounding and racing with HR >100 on her Fit bit. She also experiences lightheadedness with the dyspnea. She is typically a very active individual. She dances and recently attended a [address]. She had to stop mid-dance secondary to feeling dyspneic and fatigued. These episodes do not typically last very long and resolve with rest but she feels drained and is not able to return to activities. She denies chest pain, pressure or tightness.     ROS: All other systems reviewed and are otherwise negative except as noted in History of Present Illness.  EKGs/Labs Reviewed    EKG Interpretation Date/Time:  Wednesday April 15 2024 11:37:40 EDT Ventricular Rate:  53 PR Interval:  162 QRS Duration:  92 QT Interval:  456 QTC Calculation: 427 R Axis:   -  23  Text Interpretation: Sinus bradycardia Inferior infarct , age undetermined Cannot rule out Anterior infarct (cited on or before 13-Mar-2023) Confirmed by Loistine Sober [phone]) on 04/15/2024 11:49:08 AM    Risk Assessment/Calculations     CHA2DS2-VASc Score = 3   This indicates a 3.2% annual risk of stroke. The patient's score is based upon: CHF History: 0 HTN History: 0 Diabetes History: 0 Stroke History: 0 Vascular Disease History: 0 Age Score: 2 Gender Score: 1             Physical Exam    VS:  BP  116/62   Pulse (!) 53   Ht 5' 7 (1.702 m)   Wt 143 lb (64.9 kg)   SpO2 97%   BMI 22.40 kg/m  , BMI Body mass index is 22.4 kg/m.  GEN: Well nourished, well developed, in no acute distress. Neck: No JVD or carotid bruits. Cardiac:  RRR.  No murmur. No rubs or gallops.   Respiratory:  Respirations regular and unlabored. Clear to auscultation without rales, wheezing or rhonchi. GI: Soft, nontender, nondistended. Extremities: Radials/DP/PT 2+ and equal bilaterally. No clubbing or cyanosis. No edema.  Skin: Warm and dry, no rash. Neuro: Strength intact.  Assessment & Plan   Nonobstructive CAD Coronary CTA July 2023 demonstrated calcium  score of 340 with mild stenosis proximal LAD and D1.  Patient denies chest pain, pressure or tightness.  - Continue rosuvastatin , Toprol .  Not on aspirin  secondary to Xarelto .  Mitral valve prolapse/DOE Echo October 2020 showed mild mitral valve prolapse with mild to moderate MR (severity of MR may be underestimated due to multiple jets/eccentricity).  Patient reports recently experiencing dyspnea with less exertion. 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 She had to stop mid-dance secondary to dyspnea and increased palpitations. After episodes she feels drained and is not able to return to activity. She has had several similar episodes of needing to stop activity secondary to dyspnea. No lower extremity edema, orthopnea or PND.  - Update echo.   PAF/Increased palpitations  DCCV 2013.  14-day ZIO October 2020 demonstrated no A-fib, 1 run of NSVT, 25 runs of SVT.  Patient reports recently experiencing palpitations described as pounding and racing with HR >100 on her Fit bit. These episodes are associated with exertional dyspnea and feel different than when she goes into afib. EKG sinus bradycardia 53 bpm.  - Continue Toprol , Xarelto . - 14 day Zio.  - CBC, BMP, TSH, Mg today.   Hyperlipidemia LDL 67 April 2025, at goal. - Continue  rosuvastatin .  Disposition: Echo. 14 day Zio. CBC, BMP, TSH, Mg today. Return in 10-12 weeks or sooner as needed.          Signed, Sober HERO. Quatavious Rossa, DNP, NP-C

## 2024-04-15 NOTE — Patient Instructions (Signed)
 Medication Instructions:  Your physician recommends that you continue on your current medications as directed. Please refer to the Current Medication list given to you today.   *If you need a refill on your cardiac medications before your next appointment, please call your pharmacy*  Lab Work: Your provider would like for you to have following labs drawn today BMet, CBC, Magnesium, TSH.   If you have labs (blood work) drawn today and your tests are completely normal, you will receive your results only by: MyChart Message (if you have MyChart) OR A paper copy in the mail If you have any lab test that is abnormal or we need to change your treatment, we will call you to review the results.  Testing/Procedures: Your physician has requested that you have an Echocardiogram. Echocardiography is a painless test that uses sound waves to create images of your heart. It provides your doctor with information about the size and shape of your heart and how well your heart's chambers and valves are working.   You may receive an ultrasound enhancing agent through an IV if needed to better visualize your heart during the echo. This procedure takes approximately one hour.  There are no restrictions for this procedure.  This will take place at [address] [facility], [place] [address]  Please note: We ask at that you not bring children with you during ultrasound (echo/ vascular) testing. Due to room size and safety concerns, children are not allowed in the ultrasound rooms during exams. Our front office staff cannot provide observation of children in our lobby area while testing is being conducted. An adult accompanying a patient to their appointment will only be allowed in the ultrasound room at the discretion of the ultrasound technician under special circumstances. We apologize for any inconvenience.   ZIO XT- Long Term Monitor Instructions  Your physician has requested you wear a  ZIO patch monitor for 14 days.  This is a single patch monitor. [employer] supplies one patch monitor per enrollment. Additional stickers are not available. Please do not apply patch if you will be having a Nuclear Stress Test, Echocardiogram, Cardiac CT, MRI, or Chest Xray during the period you would be wearing the monitor. The patch cannot be worn during these tests. You cannot remove and re-apply the ZIO XT patch monitor.  Your ZIO patch monitor will be mailed 3 day USPS to your address on file. It may take 3-5 days to receive your monitor after you have been enrolled. Once you have received your monitor, please review the enclosed instructions. Your monitor has already been registered assigning a specific monitor serial number to you.  Billing and Patient Assistance Program Information  We have supplied [employer] with any of your insurance information on file for billing purposes.  [employer] offers a sliding scale Patient Assistance Program for patients that do not have insurance, or whose insurance does not completely cover the cost of the ZIO monitor.  You must apply for the Patient Assistance Program to qualify for this discounted rate.  To apply, please call [employer] at [phone], select option 4, select option 2, ask to apply for Patient Assistance Program. Meredeth will ask your household income, and how many people are in your household. They will quote your out-of-pocket cost based on that information. [employer] will also be able to set up a 6-month, interest-free payment plan if needed.  Applying the monitor   Shave hair from upper left chest.  Hold abrader disc by orange  tab. Rub abrader in 40 strokes over the upper left chest as indicated in your monitor instructions.  Clean area with 4 enclosed alcohol pads. Let dry.  Apply patch as indicated in monitor instructions. Patch will be placed under collarbone on left side of chest with arrow pointing upward.  Rub patch adhesive wings for 2 minutes.  Remove white label marked 1. Remove the white label marked 2. Rub patch adhesive wings for 2 additional minutes.  While looking in a mirror, press and release button in center of patch. A small green light will flash 3-4 times. This will be your only indicator that the monitor has been turned on.  Do not shower for the first 24 hours. You may shower after the first 24 hours.  Press the button if you feel a symptom. You will hear a small click. Record Date, Time and Symptom in the Patient Logbook.  When you are ready to remove the patch, follow instructions on the last 2 pages of Patient Logbook.  Stick patch monitor into the tabs at the bottom of the return box.  Place Patient Logbook in the blue and white box. Use locking tab on box and tape box closed securely. The blue and white box has prepaid postage on it. Please place it in the mailbox as soon as possible. Your physician should have your test results approximately 7-14 days after the monitor has been mailed back to [employer]  Call [facility] Customer Care at [phone] if you have questions regarding your ZIO XT patch monitor.  Call them immediately if you see an orange light blinking on your monitor.  If your monitor falls off in less than 4 days, contact our Monitor department at [phone].  If your monitor becomes loose or falls off after 4 days call [employer] at [phone] for suggestions on securing your monitor.   Follow-Up: At [facility], you and your health needs are our priority.  As part of our continuing mission to provide you with exceptional heart care, our providers are all part of one team.  This team includes your primary Cardiologist (physician) and Advanced Practice Providers or APPs (Physician Assistants and Nurse Practitioners) who all work together to provide you with the care you need, when you need it.  Your next appointment:   3 - 4 month(s)  Provider:   Evalene Lunger, MD or Barnie Hila, NP    We recommend signing up for the patient portal called MyChart.  Sign up information is provided on this After Visit Summary.  MyChart is used to connect with patients for Virtual Visits (Telemedicine).  Patients are able to view lab/test results, encounter notes, upcoming appointments, etc.  Non-urgent messages can be sent to your provider as well.   To learn more about what you can do with MyChart, go to [email or web address].

## 2024-04-16 ENCOUNTER — Ambulatory Visit: Payer: Self-pay | Admitting: Student

## 2024-04-16 LAB — TSH: TSH: 2.74 u[IU]/mL (ref 0.450–4.500)

## 2024-04-16 LAB — BASIC METABOLIC PANEL WITH GFR
BUN/Creatinine Ratio: 16 (ref 12–28)
BUN: 13 mg/dL (ref 8–27)
CO2: 24 mmol/L (ref 20–29)
Calcium: 8.9 mg/dL (ref 8.7–10.3)
Chloride: 103 mmol/L (ref 96–106)
Creatinine, Ser: 0.82 mg/dL (ref 0.57–1.00)
Glucose: 79 mg/dL (ref 70–99)
Potassium: 4.2 mmol/L (ref 3.5–5.2)
Sodium: 141 mmol/L (ref 134–144)
eGFR: 73 mL/min/1.73 (ref >59)

## 2024-04-16 LAB — CBC
Hematocrit: 39.9 % (ref 34.0–46.6)
Hemoglobin: 12.7 g/dL (ref 11.1–15.9)
MCH: 29.7 pg (ref 26.6–33.0)
MCHC: 31.8 g/dL (ref 31.5–35.7)
MCV: 93 fL (ref 79–97)
Platelets: 220 x10E3/uL (ref 150–450)
RBC: 4.28 x10E6/uL (ref 3.77–5.28)
RDW: 12.8 % (ref 11.7–15.4)
WBC: 8.6 x10E3/uL (ref 3.4–10.8)

## 2024-04-16 LAB — MAGNESIUM: Magnesium: 2.2 mg/dL (ref 1.6–2.3)

## 2024-04-16 NOTE — Progress Notes (Signed)
 Last read by Soleia H Viereck at 9:22AM on 04/16/2024.

## 2024-04-28 ENCOUNTER — Other Ambulatory Visit: Payer: Self-pay | Admitting: Physician Assistant

## 2024-04-28 DIAGNOSIS — Z1231 Encounter for screening mammogram for malignant neoplasm of breast: Secondary | ICD-10-CM

## 2024-05-05 ENCOUNTER — Ambulatory Visit

## 2024-05-05 DIAGNOSIS — Z1231 Encounter for screening mammogram for malignant neoplasm of breast: Secondary | ICD-10-CM

## 2024-05-07 ENCOUNTER — Ambulatory Visit
Admission: RE | Admit: 2024-05-07 | Discharge: 2024-05-07 | Disposition: A | Discharge status: Home / Self Care | Source: Ambulatory Visit | Attending: Physician Assistant | Admitting: Physician Assistant

## 2024-05-07 DIAGNOSIS — Z1231 Encounter for screening mammogram for malignant neoplasm of breast: Secondary | ICD-10-CM | POA: Insufficient documentation

## 2024-05-08 ENCOUNTER — Other Ambulatory Visit: Payer: Self-pay | Admitting: Cardiovascular Disease

## 2024-05-08 DIAGNOSIS — I480 Paroxysmal atrial fibrillation: Secondary | ICD-10-CM

## 2024-05-08 NOTE — Telephone Encounter (Signed)
 Prescription refill request for Xarelto  received.  Indication:afib Last office visit:9/25 Weight:64.9  kg Age:78 Scr:0.82  9/25 CrCl:57.93  ml/min  Prescription refilled

## 2024-05-10 DIAGNOSIS — I480 Paroxysmal atrial fibrillation: Secondary | ICD-10-CM

## 2024-05-11 ENCOUNTER — Ambulatory Visit: Admitting: Dermatology

## 2024-05-12 ENCOUNTER — Telehealth: Payer: Self-pay | Admitting: Emergency Medicine

## 2024-05-12 ENCOUNTER — Ambulatory Visit: Admitting: Dermatology

## 2024-05-12 NOTE — Telephone Encounter (Signed)
 Received the follow message from the patient on MyChart:   I got another monitor in the mail yesterday telling me to wear for 11 days then return. I put it on last night. I assumed you guys knew about this. If there r more instructions please let me know   After reviewing patient's chart and realizing that providers in this office had not ordered another monitor study, called and spoke with Julie Chapman of [employer].  She stated that the monitor was sent by mistake.  Apparently, there was a duplicate order that was cancelled, but the monitor had been sent out already.  She informed this RN to get in touch with the patient to let her know that she can just mail the monitor back and she would not be charged for the monitor.   Called the patient to inform her that she did not need to wear the monitor and to take it off and mail it back to the company in the provided box and she would not be charged for the 2nd device.  Unable to reach the patient.  Left voicemail, per DPR, for the patient to call back.  Call back number provided.

## 2024-05-12 NOTE — Progress Notes (Signed)
 Last read by Xitlaly H Knipp at 9:45AM on 05/12/2024.

## 2024-05-12 NOTE — Telephone Encounter (Signed)
 Called and left message on patient's voicemail, per DPR, stating that she can take off the 2nd monitor that she received yesterday and return it back to the company in the return box provided and she would not be charged for the 2nd device.  Call back number provided.

## 2024-05-22 ENCOUNTER — Telehealth: Payer: Self-pay | Admitting: Emergency Medicine

## 2024-05-22 NOTE — Telephone Encounter (Signed)
 Called and spoke with the patient to reschedule her appointment from 05/25/24 to 06/08/24.  Patient has an ECHO scheduled for 05/26/24 and should have the follow-up office visit after the ECHO.  Patient agreeable to the change with all questions and concerns addressed at this time.

## 2024-05-25 ENCOUNTER — Ambulatory Visit: Admitting: Dermatology

## 2024-05-25 ENCOUNTER — Ambulatory Visit: Admitting: Student

## 2024-05-25 DIAGNOSIS — L820 Inflamed seborrheic keratosis: Secondary | ICD-10-CM | POA: Diagnosis not present

## 2024-05-25 DIAGNOSIS — L578 Other skin changes due to chronic exposure to nonionizing radiation: Secondary | ICD-10-CM

## 2024-05-25 DIAGNOSIS — C44629 Squamous cell carcinoma of skin of left upper limb, including shoulder: Secondary | ICD-10-CM | POA: Diagnosis not present

## 2024-05-25 DIAGNOSIS — L821 Other seborrheic keratosis: Secondary | ICD-10-CM

## 2024-05-25 DIAGNOSIS — D485 Neoplasm of uncertain behavior of skin: Secondary | ICD-10-CM

## 2024-05-25 DIAGNOSIS — L988 Other specified disorders of the skin and subcutaneous tissue: Secondary | ICD-10-CM | POA: Diagnosis not present

## 2024-05-25 DIAGNOSIS — Z8589 Personal history of malignant neoplasm of other organs and systems: Secondary | ICD-10-CM

## 2024-05-25 DIAGNOSIS — C4492 Squamous cell carcinoma of skin, unspecified: Secondary | ICD-10-CM

## 2024-05-25 DIAGNOSIS — Z7189 Other specified counseling: Secondary | ICD-10-CM

## 2024-05-25 HISTORY — DX: Squamous cell carcinoma of skin, unspecified: C44.92

## 2024-05-25 NOTE — Progress Notes (Signed)
 Follow-Up Visit   Subjective  [name] is a 78 y.o. female who presents for the following: AK and ISK follow up - tx with LN2 at last visit on the arms, legs, and back  The following portions of the chart were reviewed this encounter and updated as appropriate: medications, allergies, medical history  Review of Systems:  No other skin or systemic complaints except as noted in HPI or Assessment and Plan.  Objective  Well appearing patient in no apparent distress; mood and affect are within normal limits.  A focused examination was performed of the following areas: the face, arms, legs, back and hands  Relevant exam findings are noted in the Assessment and Plan.  legs x 16, arms x 3 (19) Erythematous keratotic or waxy stuck-on papule or plaque. L forearm near the antecubital 1.2 cm hyperkeratotic papule.   Assessment & Plan   SEBORRHEIC KERATOSIS - Stuck-on, waxy, tan-brown papules and/or plaques  - Benign-appearing - Discussed benign etiology and prognosis. - Observe - Call for any changes  HISTORY OF SQUAMOUS CELL CARCINOMA OF THE SKIN - No evidence of recurrence today - No lymphadenopathy - Recommend regular full body skin exams - Recommend daily broad spectrum sunscreen SPF 30+ to sun-exposed areas, reapply every 2 hours as needed.  - Call if any new or changing lesions are noted between office visits    INFLAMED SEBORRHEIC KERATOSIS (19) legs x 16, arms x 3 (19) Symptomatic, irritating, patient would like treated. Destruction of lesion - legs x 16, arms x 3 (19) Complexity: simple   Destruction method: cryotherapy   Informed consent: discussed and consent obtained   Timeout:  patient name, date of birth, surgical site, and procedure verified Lesion destroyed using liquid nitrogen: Yes   Region frozen until ice ball extended beyond lesion: Yes   Outcome: patient tolerated procedure well with no complications   Post-procedure details: wound care  instructions given    NEOPLASM OF UNCERTAIN BEHAVIOR OF SKIN L forearm near the antecubital Epidermal / dermal shaving  Lesion diameter (cm):  1.2 Informed consent: discussed and consent obtained   Timeout: patient name, date of birth, surgical site, and procedure verified   Procedure prep:  Patient was prepped and draped in usual sterile fashion Prep type:  Isopropyl alcohol Anesthesia: the lesion was anesthetized in a standard fashion   Anesthetic:  1% lidocaine w/ epinephrine 1-100,000 buffered w/ 8.4% NaHCO3 Instrument used: flexible razor blade   Hemostasis achieved with: pressure, aluminum chloride and electrodesiccation   Outcome: patient tolerated procedure well   Post-procedure details: sterile dressing applied and wound care instructions given   Dressing type: bandage (Mupirocin  2% ointment)    Destruction of lesion Complexity: extensive   Destruction method: electrodesiccation and curettage   Informed consent: discussed and consent obtained   Timeout:  patient name, date of birth, surgical site, and procedure verified Procedure prep:  Patient was prepped and draped in usual sterile fashion Prep type:  Isopropyl alcohol Anesthesia: the lesion was anesthetized in a standard fashion   Anesthetic:  1% lidocaine w/ epinephrine 1-100,000 buffered w/ 8.4% NaHCO3 Curettage performed in three different directions: Yes   Electrodesiccation performed over the curetted area: Yes   Lesion length (cm):  1.2 Lesion width (cm):  1.2 Margin per side (cm):  0.2 Final wound size (cm):  1.6 Hemostasis achieved with:  pressure, aluminum chloride and electrodesiccation Outcome: patient tolerated procedure well with no complications   Post-procedure details: sterile dressing applied and wound care  instructions given   Dressing type: bandage and petrolatum    Specimen 1 - Surgical pathology Differential Diagnosis: D48.5 r/o hypertrophic AK vs SCC ED&C today Check Margins: No ACTINIC  SKIN DAMAGE   Related Medications fluorouracil  (EFUDEX ) 5 % cream Apply to the left lower leg BID x 7 days. SEBORRHEIC KERATOSIS   HISTORY OF SQUAMOUS CELL CARCINOMA   ELASTOSIS OF SKIN   COUNSELING AND COORDINATION OF CARE   SCC (SQUAMOUS CELL CARCINOMA), ARM, LEFT     FACIAL ELASTOSIS Exam: Rhytides and volume loss. Treatment Plan: Pt interested in laser treatment for deep lines/wrinkles. Pt would need aggressive treatment. Recommend patient discussing treatment options with: Beverley Leeroy Free, M.D. [address] [place], [place] [address] [phone]  [email or web address]  Or   Dr. Medford Noa Address: [phone] [address] [place], [place] [address] Phone: [phone] Products and Services: [email or web address]  Consider tretinoin 0.025% cream, pt defers today.  Recommend daily broad spectrum sunscreen SPF 30+ to sun-exposed areas, reapply every 2 hours as needed. Call for new or changing lesions.  Staying in the shade or wearing long sleeves, sun glasses (UVA+UVB protection) and wide brim hats (4-inch brim around the entire circumference of the hat) are also recommended for sun protection.   Return in about 6 months (around 11/22/2024) for TBSE.  [name], CMA, am acting as scribe for Alm Rhyme, MD .   Documentation: I have reviewed the above documentation for accuracy and completeness, and I agree with the above.  Alm Rhyme, MD

## 2024-05-25 NOTE — Patient Instructions (Addendum)
 Recommend  Beverley Leeroy Free, M.D. [address] [place], [place] [address] [phone]  [email or web address]  Dr. Medford Noa Address: [phone] [address], [place], [place] [address] Phone: [phone] Products and Services: [email or web address]  Wound Care Instructions  Cleanse wound gently with soap and water once a day then pat dry with clean gauze. Apply a thin coat of Petrolatum (petroleum jelly, Vaseline) over the wound (unless you have an allergy to this). We recommend that you use a new, sterile tube of Vaseline. Do not pick or remove scabs. Do not remove the yellow or white healing tissue from the base of the wound.  Cover the wound with fresh, clean, nonstick gauze and secure with paper tape. You may use Band-Aids in place of gauze and tape if the wound is small enough, but would recommend trimming much of the tape off as there is often too much. Sometimes Band-Aids can irritate the skin.  You should call the office for your biopsy report after 1 week if you have not already been contacted.  If you experience any problems, such as abnormal amounts of bleeding, swelling, significant bruising, significant pain, or evidence of infection, please call the office immediately.  FOR ADULT SURGERY PATIENTS: If you need something for pain relief you may take 1 extra strength Tylenol  (acetaminophen ) AND 2 Ibuprofen (200mg  each) together every 4 hours as needed for pain. (do not take these if you are allergic to them or if you have a reason you should not take them.) Typically, you may only need pain medication for 1 to 3 days.     Due to recent changes in healthcare laws, you may see results of your pathology and/or laboratory studies on MyChart before the doctors have had a chance to review them. We understand that in some cases there may be results that are confusing or concerning to you. Please understand that not all results are  received at the same time and often the doctors may need to interpret multiple results in order to provide you with the best plan of care or course of treatment. Therefore, we ask that you please give us  2 business days to thoroughly review all your results before contacting the office for clarification. Should we see a critical lab result, you will be contacted sooner.   If You Need Anything After Your Visit  If you have any questions or concerns for your doctor, please call our main line at [phone] and press option 4 to reach your doctor's medical assistant. If no one answers, please leave a voicemail as directed and we will return your call as soon as possible. Messages left after 4 pm will be answered the following business day.   You may also send us  a message via MyChart. We typically respond to MyChart messages within 1-2 business days.  For prescription refills, please ask your pharmacy to contact our office. Our fax number is [phone].  If you have an urgent issue when the clinic is closed that cannot wait until the next business day, you can page your doctor at the number below.    Please note that while we do our best to be available for urgent issues outside of office hours, we are not available 24/7.   If you have an urgent issue and are unable to reach us , you may choose to seek medical care at your doctor's office, retail clinic, urgent care center, or emergency room.  If you have a  medical emergency, please immediately call 911 or go to the emergency department.  Pager Numbers  - Dr. Hester: [phone]  - Dr. Jackquline: [phone]  - Dr. Claudene: [phone]   - Dr. Raymund: [phone]  In the event of inclement weather, please call our main line at [phone] for an update on the status of any delays or closures.  Dermatology Medication Tips: Please keep the boxes that topical medications come in in order to help keep track of the instructions about  where and how to use these. Pharmacies typically print the medication instructions only on the boxes and not directly on the medication tubes.   If your medication is too expensive, please contact our office at [phone] option 4 or send us  a message through MyChart.   We are unable to tell what your co-pay for medications will be in advance as this is different depending on your insurance coverage. However, we may be able to find a substitute medication at lower cost or fill out paperwork to get insurance to cover a needed medication.   If a prior authorization is required to get your medication covered by your insurance company, please allow us  1-2 business days to complete this process.  Drug prices often vary depending on where the prescription is filled and some pharmacies may offer cheaper prices.  The website www.goodrx.com contains coupons for medications through different pharmacies. The prices here do not account for what the cost may be with help from insurance (it may be cheaper with your insurance), but the website can give you the price if you did not use any insurance.  - You can print the associated coupon and take it with your prescription to the pharmacy.  - You may also stop by our office during regular business hours and pick up a GoodRx coupon card.  - If you need your prescription sent electronically to a different pharmacy, notify our office through [facility] or by phone at [phone] option 4.     Si Usted Necesita Algo Despus de Su Visita  Tambin puede enviarnos un mensaje a travs de Clinical Cytogeneticist. Por lo general respondemos a los mensajes de MyChart en el transcurso de 1 a 2 das hbiles.  Para renovar recetas, por favor pida a su farmacia que se ponga en contacto con nuestra oficina. Randi lakes de fax es [place] [phone].  Si tiene un asunto urgente cuando la clnica est cerrada y que no puede esperar hasta el siguiente da hbil, puede  llamar/localizar a su doctor(a) al nmero que aparece a continuacin.   Por favor, tenga en cuenta que aunque hacemos todo lo posible para estar disponibles para asuntos urgentes fuera del horario de [place], no estamos disponibles las 24 horas del da, los 7 [address] de la [place].   Si tiene un problema urgente y no puede comunicarse con nosotros, puede optar por buscar atencin mdica  en el consultorio de su doctor(a), en una clnica privada, en un centro de atencin urgente o en una sala de emergencias.  Si tiene engineer, drilling, por favor llame inmediatamente al 911 o vaya a la sala de emergencias.  Nmeros de bper  - Dr. Hester: [phone]  - Dra. Jackquline: [phone]  - Dr. Claudene: [phone]  - Dra. Kitts: [phone]  En caso de inclemencias del [place], por favor llame a nuestra lnea principal al [phone] para una actualizacin sobre el estado de cualquier retraso o cierre.  Consejos para la medicacin en dermatologa: Por favor, guarde las cajas  en las que vienen los medicamentos de uso tpico para ayudarle a seguir las instrucciones sobre dnde y cmo usarlos. Las farmacias generalmente imprimen las instrucciones del medicamento slo en las cajas y no directamente en los tubos del [place].   Si su medicamento es muy caro, por favor, pngase en contacto con landry rieger llamando al [phone] y presione la opcin 4 o envenos un mensaje a travs de Clinical Cytogeneticist.   No podemos decirle cul ser su copago por los medicamentos por adelantado ya que esto es diferente dependiendo de la cobertura de su seguro. Sin embargo, es posible que podamos encontrar un medicamento sustituto a audiological scientist un formulario para que el seguro cubra el medicamento que se considera necesario.   Si se requiere una autorizacin previa para que su compaa de seguros cubra su medicamento, por favor permtanos de 1 a 2 das hbiles para completar este proceso.  Los precios de los  medicamentos varan con frecuencia dependiendo del environmental consultant de dnde se surte la receta y alguna farmacias pueden ofrecer precios ms baratos.  El sitio web www.goodrx.com tiene cupones para medicamentos de health and safety inspector. Los precios aqu no tienen en cuenta lo que podra costar con la ayuda del seguro (puede ser ms barato con su seguro), pero el sitio web puede darle el precio si no utiliz tourist information centre manager.  - Puede imprimir el cupn correspondiente y llevarlo con su receta a la farmacia.  - Tambin puede pasar por nuestra oficina durante el horario de atencin regular y education officer, museum una tarjeta de cupones de GoodRx.  - Si necesita que su receta se enve electrnicamente a una farmacia diferente, informe a nuestra oficina a travs de MyChart de [place] o por telfono llamando al [phone] y presione la opcin 4.

## 2024-05-25 NOTE — Progress Notes (Signed)
 Cardiology Clinic Note   Date: 06/08/2024 ID: Petra, Dumler 1945-12-25, MRN [id number]  Primary Cardiologist:  Evalene Lunger, MD  Chief Complaint   [name] is a 78 y.o. female who presents to the clinic today for follow up after testing.   Patient Profile   [name] is followed by Dr. Gollan for the history outlined below.       Past medical history significant for: Nonobstructive CAD.  Coronary CTA 02/19/2022: Coronary calcium  score 340 (7 percentile).  Calcified plaque causing mild stenosis proximal LAD and D1. Mitral valve prolapse. Echo 05/26/2024: EF 55 to 60%.  No RWMA.  Grade I DD.  Normal global strain.  Normal RV size/function.  Normal PA pressure, RVSP 19 mmHg.  Moderate MR.  Mild AI. PAF.  DCCV 2013. 14-day Zio 05/08/2024: HR 39 to 185 bpm, average 56 bpm.  46 runs of SVT fastest 4 beats max rate 185 bpm, longest 20 beats average rate 119 bpm.  Rare ectopy.  1 patient triggered event associated with NSR, rare PAC. Hyperlipidemia. Lipid panel 11/12/2023: LDL 67, HDL 68, TG 68, total 149. GERD.  GAD.  Former tobacco abuse.   In summary, patient underwent coronary artery calcium  scoring in 11/2014 with a score of 50 with all calcium  being noted in the proximal LAD which placed her in the 69th percentile for age/sex with follow-up exercise Myoview  in 05/2015 showing no infarct or ischemia with an EF of 73% and was overall a low risk study.  Her A. fib has been managed with flecainide  and Xarelto .  When she was seen in early 2019 she noted occasional episodes of paroxysms of A. Fib, which were generally short-lived, and she would periodically take an extra flecainide .  In this setting, her flecainide  was increased to 75 mg twice daily at that time.  14-day ZIO on October 2020 demonstrated 1 run of NSVT and 95 runs of SVT, no A-fib.  Echo showed normal LV/RV function with mild mitral valve prolapse and mild to moderate MR.  Patient was seen in the clinic on  04/18/2023 for routine follow-up.  She was doing well at that time and reported A-fib well-controlled on flecainide  100 mg twice daily and Toprol  25 mg daily.  She was tolerating Xarelto  for stroke prophylaxis.  No medication changes were made.   Patient was last seen in the office by me on 04/15/2024 for evaluation of shortness of breath.  She reported a several week history of increased dyspnea with less exertion accompanied by increased palpitations and lightheadedness.  She reported symptoms most prevalent while at a [employer] requiring her to stop mid-dance.  Episodes were reported as brief resolving with rest but leaving her feeling drained.  Patient wore a 14-day ZIO which demonstrated runs of SVT.  Echo showed normal LV/RV function.     History of Present Illness    Today, patient is here alone. She continues to feel winded with heavier exertion. Patient denies lower extremity edema, orthopnea or PND. No chest pain, pressure, or tightness. She monitors her heart rate and rhythm with her smart watch. She admits that she does not dance as regularly as she used since Covid. She reports she is busy and is always doing something but she does participate in regular exercise. She does spend 3 months in [place]  during the winter months and reports being much more active when she is there. She rides her bicycle or walks to get places and is generally more active.  She participates in sedentary activities such as jewelry making and painting. Discussed results of heart monitor and echo in detail. All questions answered.     ROS: All other systems reviewed and are otherwise negative except as noted in History of Present Illness.  EKGs/Labs Reviewed       EKG not performed today.   04/15/2024: BUN 13; Creatinine, Ser 0.82; Potassium 4.2; Sodium 141   04/15/2024: Hemoglobin 12.7; WBC 8.6   04/15/2024: TSH 2.740   Risk Assessment/Calculations     CHA2DS2-VASc Score = 3   This indicates a  3.2% annual risk of stroke. The patient's score is based upon: CHF History: 0 HTN History: 0 Diabetes History: 0 Stroke History: 0 Vascular Disease History: 0 Age Score: 2 Gender Score: 1             Physical Exam    VS:  BP (!) 106/56   Pulse 60   Ht 5' 7 (1.702 m)   Wt 141 lb 12.8 oz (64.3 kg)   SpO2 98%   BMI 22.21 kg/m  , BMI Body mass index is 22.21 kg/m.  GEN: Well nourished, well developed, in no acute distress. Neck: No JVD or carotid bruits. Cardiac:  RRR.  No murmur. No rubs or gallops.   Respiratory:  Respirations regular and unlabored. Clear to auscultation without rales, wheezing or rhonchi. GI: Soft, nontender, nondistended. Extremities: Radials/DP/PT 2+ and equal bilaterally. No clubbing or cyanosis. No edema  Skin: Warm and dry, no rash. Neuro: Strength intact.  Assessment & Plan   Nonobstructive CAD Coronary CTA July 2023 demonstrated calcium  score of 340 with mild stenosis proximal LAD and D1.  Patient denies chest pain, pressure or tightness. She is not as active with dancing as she used to be. She is busy but admits she does not participate in routine exercise. She is more active with riding her bicycle and walking when she is in [place]  for the winter.  - Encouraged regular physical activity.  - Continue rosuvastatin , Toprol .  Not on aspirin  secondary to Xarelto .   Mitral valve prolapse/DOE Echo November 2025 showed normal LV/RV function with moderate MR and mild AI.  Patient reports continued episodes of feeling winded typically with heavier exertion. Discussed increase physical activity as above.  - Continue to monitor with yearly echo.    PAF DCCV 2013.  14-day ZIO October 2025 demonstrated no A-fib, 46 runs of PSVT, fastest 4 beats max rate 185 bpm, longest 20 beats average rate 119 bpm.  Denies spontaneous bleeding concerns.  Patient monitors heart rate and rhythm with smart watch. RRR on exam today.  - Continue Toprol , flecainide ,  Xarelto .   Hyperlipidemia LDL 67 April 2025, at goal. - Continue rosuvastatin .  Disposition: Return in 1 year or sooner as needed.          Signed, Barnie HERO. Rudy Luhmann, DNP, NP-C

## 2024-05-26 ENCOUNTER — Ambulatory Visit: Attending: Student

## 2024-05-26 DIAGNOSIS — R0609 Other forms of dyspnea: Secondary | ICD-10-CM

## 2024-05-26 LAB — ECHOCARDIOGRAM COMPLETE
AR max vel: 2.23 cm2
AV Area VTI: 2.09 cm2
AV Area mean vel: 2.21 cm2
AV Mean grad: 3 mmHg
AV Peak grad: 5.5 mmHg
Ao pk vel: 1.17 m/s
Area-P 1/2: 3.31 cm2
MV M vel: 2.45 m/s
MV Peak grad: 24 mmHg
S' Lateral: 3.1 cm

## 2024-05-27 ENCOUNTER — Encounter: Payer: Self-pay | Admitting: Dermatology

## 2024-05-28 LAB — SURGICAL PATHOLOGY

## 2024-05-28 NOTE — Progress Notes (Signed)
 Last read by Jiali H Vandervliet at 12:15PM on 05/28/2024.

## 2024-06-01 ENCOUNTER — Ambulatory Visit: Payer: Self-pay | Admitting: Dermatology

## 2024-06-01 ENCOUNTER — Other Ambulatory Visit: Payer: Self-pay | Admitting: Cardiovascular Disease

## 2024-06-02 ENCOUNTER — Encounter: Payer: Self-pay | Admitting: Dermatology

## 2024-06-02 ENCOUNTER — Telehealth: Payer: Self-pay | Admitting: Cardiovascular Disease

## 2024-06-02 NOTE — Telephone Encounter (Signed)
*  STAT* If patient is at the pharmacy, call can be transferred to refill team.   1. Which medications need to be refilled? (please list name of each medication and dose if known) rosuvastatin  (CRESTOR ) 10 MG tablet   2. Which pharmacy/location (including street and city if local pharmacy) is medication to be sent to?  [facility] - [place], [place] - [address]    3. Do they need a 30 day or 90 day supply? 90

## 2024-06-02 NOTE — Telephone Encounter (Addendum)
 Called and discussed bx results with patient. She verbalized understanding. Will recheck area at next follow up----- Message from Alm Rhyme sent at 06/01/2024  2:57 PM EST ----- FINAL DIAGNOSIS        1. Skin, L forearm near the antecubital :       WELL DIFFERENTIATED SQUAMOUS CELL CARCINOMA   Cancer = SCC Already terated Recheck next visit ----- Message ----- From: Interface, Lab In Three Zero Seven Sent: 05/28/2024   5:45 PM EST To: Alm JAYSON Rhyme, MD

## 2024-06-03 MED ORDER — ROSUVASTATIN CALCIUM 10 MG PO TABS
10.0000 mg | ORAL_TABLET | Freq: Every day | ORAL | 3 refills | Status: AC
Start: 2024-06-03 — End: ?

## 2024-06-03 NOTE — Telephone Encounter (Signed)
 Per Barnie Hila, NP, Rosuvastatin  10 mg has been sent to [facility].  Rosuvastatin  5 mg has been removed.

## 2024-06-08 ENCOUNTER — Encounter: Payer: Self-pay | Admitting: Student

## 2024-06-08 ENCOUNTER — Ambulatory Visit: Admitting: Student

## 2024-06-08 ENCOUNTER — Ambulatory Visit: Attending: Student | Admitting: Student

## 2024-06-08 VITALS — BP 106/56 | HR 60 | Ht 67.0 in | Wt 141.8 lb

## 2024-06-08 DIAGNOSIS — I2510 Atherosclerotic heart disease of native coronary artery without angina pectoris: Secondary | ICD-10-CM | POA: Diagnosis not present

## 2024-06-08 DIAGNOSIS — E785 Hyperlipidemia, unspecified: Secondary | ICD-10-CM | POA: Diagnosis not present

## 2024-06-08 DIAGNOSIS — R0609 Other forms of dyspnea: Secondary | ICD-10-CM | POA: Diagnosis not present

## 2024-06-08 DIAGNOSIS — I341 Nonrheumatic mitral (valve) prolapse: Secondary | ICD-10-CM

## 2024-06-08 DIAGNOSIS — I480 Paroxysmal atrial fibrillation: Secondary | ICD-10-CM

## 2024-06-08 NOTE — Patient Instructions (Signed)
 Medication Instructions:  Your physician recommends that you continue on your current medications as directed. Please refer to the Current Medication list given to you today.   *If you need a refill on your cardiac medications before your next appointment, please call your pharmacy*  Lab Work: No labs ordered today  If you have labs (blood work) drawn today and your tests are completely normal, you will receive your results only by: MyChart Message (if you have MyChart) OR A paper copy in the mail If you have any lab test that is abnormal or we need to change your treatment, we will call you to review the results.  Testing/Procedures: No test ordered today   Follow-Up: At [facility], you and your health needs are our priority.  As part of our continuing mission to provide you with exceptional heart care, our providers are all part of one team.  This team includes your primary Cardiologist (physician) and Advanced Practice Providers or APPs (Physician Assistants and Nurse Practitioners) who all work together to provide you with the care you need, when you need it.  Your next appointment:   1 year(s)  Provider:   You may see Timothy Gollan, MD or one of the following Advanced Practice Providers on your designated Care Team:   Barnie Hila, NP   We recommend signing up for the patient portal called MyChart.  Sign up information is provided on this After Visit Summary.  MyChart is used to connect with patients for Virtual Visits (Telemedicine).  Patients are able to view lab/test results, encounter notes, upcoming appointments, etc.  Non-urgent messages can be sent to your provider as well.   To learn more about what you can do with MyChart, go to [email or web address].

## 2024-06-10 DIAGNOSIS — H04123 Dry eye syndrome of bilateral lacrimal glands: Secondary | ICD-10-CM | POA: Diagnosis not present

## 2024-06-10 DIAGNOSIS — H16223 Keratoconjunctivitis sicca, not specified as Sjogren's, bilateral: Secondary | ICD-10-CM | POA: Diagnosis not present

## 2024-06-10 DIAGNOSIS — H18413 Arcus senilis, bilateral: Secondary | ICD-10-CM | POA: Diagnosis not present

## 2024-06-10 DIAGNOSIS — Z961 Presence of intraocular lens: Secondary | ICD-10-CM | POA: Diagnosis not present

## 2024-11-23 ENCOUNTER — Ambulatory Visit: Admitting: Dermatology
